# Patient Record
Sex: Male | Born: 1950
Health system: Southern US, Community
[De-identification: ages and names within clinical notes are randomized; demographics above are authoritative.]

## PROBLEM LIST (undated history)

## (undated) DIAGNOSIS — M109 Gout, unspecified: Secondary | ICD-10-CM

## (undated) DIAGNOSIS — K5790 Diverticulosis of intestine, part unspecified, without perforation or abscess without bleeding: Secondary | ICD-10-CM

## (undated) DIAGNOSIS — E785 Hyperlipidemia, unspecified: Secondary | ICD-10-CM

## (undated) DIAGNOSIS — I10 Essential (primary) hypertension: Secondary | ICD-10-CM

## (undated) DIAGNOSIS — K760 Fatty (change of) liver, not elsewhere classified: Secondary | ICD-10-CM

## (undated) DIAGNOSIS — G959 Disease of spinal cord, unspecified: Principal | ICD-10-CM

## (undated) DIAGNOSIS — K429 Umbilical hernia without obstruction or gangrene: Secondary | ICD-10-CM

## (undated) DIAGNOSIS — M5412 Radiculopathy, cervical region: Secondary | ICD-10-CM

## (undated) DIAGNOSIS — Z7982 Long term (current) use of aspirin: Secondary | ICD-10-CM

## (undated) DIAGNOSIS — G4700 Insomnia, unspecified: Secondary | ICD-10-CM

## (undated) DIAGNOSIS — M5135 Other intervertebral disc degeneration, thoracolumbar region: Secondary | ICD-10-CM

## (undated) DIAGNOSIS — N183 Chronic kidney disease, stage 3 unspecified: Secondary | ICD-10-CM

## (undated) DIAGNOSIS — K5710 Diverticulosis of small intestine without perforation or abscess without bleeding: Secondary | ICD-10-CM

## (undated) DIAGNOSIS — M4802 Spinal stenosis, cervical region: Secondary | ICD-10-CM

## (undated) DIAGNOSIS — F419 Anxiety disorder, unspecified: Secondary | ICD-10-CM

## (undated) DIAGNOSIS — I2510 Atherosclerotic heart disease of native coronary artery without angina pectoris: Secondary | ICD-10-CM

## (undated) DIAGNOSIS — F32A Depression, unspecified: Secondary | ICD-10-CM

## (undated) DIAGNOSIS — F329 Major depressive disorder, single episode, unspecified: Secondary | ICD-10-CM

## (undated) DIAGNOSIS — K219 Gastro-esophageal reflux disease without esophagitis: Secondary | ICD-10-CM

## (undated) DIAGNOSIS — E039 Hypothyroidism, unspecified: Secondary | ICD-10-CM

## (undated) DIAGNOSIS — F1411 Cocaine abuse, in remission: Secondary | ICD-10-CM

## (undated) DIAGNOSIS — K449 Diaphragmatic hernia without obstruction or gangrene: Secondary | ICD-10-CM

## (undated) HISTORY — PX: CARDIAC CATHETERIZATION: SHX172

## (undated) HISTORY — PX: CORONARY ARTERY BYPASS GRAFT: SHX141

## (undated) HISTORY — PX: CARDIAC SURGERY: SHX584

---

## 1998-05-12 DIAGNOSIS — Z951 Presence of aortocoronary bypass graft: Secondary | ICD-10-CM

## 1998-05-12 HISTORY — DX: Presence of aortocoronary bypass graft: Z95.1

## 1999-03-15 ENCOUNTER — Encounter: Payer: Self-pay | Admitting: Emergency Medicine

## 1999-03-15 ENCOUNTER — Inpatient Hospital Stay (HOSPITAL_COMMUNITY): Admission: EM | Admit: 1999-03-15 | Discharge: 1999-03-24 | Payer: Self-pay | Admitting: Emergency Medicine

## 1999-03-15 DIAGNOSIS — I2510 Atherosclerotic heart disease of native coronary artery without angina pectoris: Secondary | ICD-10-CM

## 1999-03-15 HISTORY — PX: LEFT HEART CATH AND CORONARY ANGIOGRAPHY: CATH118249

## 1999-03-15 HISTORY — DX: Atherosclerotic heart disease of native coronary artery without angina pectoris: I25.10

## 1999-03-16 ENCOUNTER — Encounter: Payer: Self-pay | Admitting: Cardiology

## 1999-03-18 ENCOUNTER — Encounter: Payer: Self-pay | Admitting: Thoracic Surgery (Cardiothoracic Vascular Surgery)

## 1999-03-18 HISTORY — PX: CORONARY ARTERY BYPASS GRAFT: SHX141

## 1999-03-19 ENCOUNTER — Encounter: Payer: Self-pay | Admitting: Thoracic Surgery (Cardiothoracic Vascular Surgery)

## 1999-03-21 ENCOUNTER — Encounter: Payer: Self-pay | Admitting: Thoracic Surgery (Cardiothoracic Vascular Surgery)

## 2003-03-28 ENCOUNTER — Other Ambulatory Visit: Payer: Self-pay

## 2003-03-29 HISTORY — PX: LEFT HEART CATH AND CORS/GRAFTS ANGIOGRAPHY: CATH118250

## 2006-03-10 ENCOUNTER — Other Ambulatory Visit: Payer: Self-pay

## 2006-03-11 ENCOUNTER — Inpatient Hospital Stay: Payer: Self-pay | Admitting: Internal Medicine

## 2006-03-12 ENCOUNTER — Other Ambulatory Visit: Payer: Self-pay

## 2006-03-12 HISTORY — PX: LEFT HEART CATH AND CORS/GRAFTS ANGIOGRAPHY: CATH118250

## 2006-08-10 ENCOUNTER — Ambulatory Visit: Payer: Self-pay | Admitting: Gastroenterology

## 2007-01-11 ENCOUNTER — Other Ambulatory Visit: Payer: Self-pay

## 2007-01-11 ENCOUNTER — Inpatient Hospital Stay: Payer: Self-pay | Admitting: Internal Medicine

## 2007-01-12 HISTORY — PX: LEFT HEART CATH AND CORS/GRAFTS ANGIOGRAPHY: CATH118250

## 2007-09-20 ENCOUNTER — Inpatient Hospital Stay: Payer: Self-pay | Admitting: Internal Medicine

## 2007-09-20 ENCOUNTER — Other Ambulatory Visit: Payer: Self-pay

## 2007-09-21 ENCOUNTER — Other Ambulatory Visit: Payer: Self-pay

## 2008-11-06 ENCOUNTER — Emergency Department: Payer: Self-pay | Admitting: Emergency Medicine

## 2008-11-08 ENCOUNTER — Inpatient Hospital Stay: Payer: Self-pay | Admitting: Internal Medicine

## 2008-12-20 ENCOUNTER — Emergency Department: Payer: Self-pay | Admitting: Emergency Medicine

## 2010-05-02 ENCOUNTER — Ambulatory Visit: Payer: Self-pay | Admitting: Cardiology

## 2010-05-02 HISTORY — PX: LEFT HEART CATH AND CORS/GRAFTS ANGIOGRAPHY: CATH118250

## 2011-08-05 ENCOUNTER — Observation Stay: Payer: Self-pay | Admitting: Internal Medicine

## 2011-08-05 LAB — COMPREHENSIVE METABOLIC PANEL
Alkaline Phosphatase: 100 U/L (ref 50–136)
Anion Gap: 12 (ref 7–16)
BUN: 11 mg/dL (ref 7–18)
Bilirubin,Total: 0.3 mg/dL (ref 0.2–1.0)
Co2: 23 mmol/L (ref 21–32)
Creatinine: 1.54 mg/dL — ABNORMAL HIGH (ref 0.60–1.30)
EGFR (Non-African Amer.): 49 — ABNORMAL LOW
Glucose: 100 mg/dL — ABNORMAL HIGH (ref 65–99)
Potassium: 4.2 mmol/L (ref 3.5–5.1)
SGOT(AST): 26 U/L (ref 15–37)
Sodium: 145 mmol/L (ref 136–145)
Total Protein: 7.2 g/dL (ref 6.4–8.2)

## 2011-08-05 LAB — CBC
MCV: 92 fL (ref 80–100)
Platelet: 202 10*3/uL (ref 150–440)
RBC: 4.72 10*6/uL (ref 4.40–5.90)
RDW: 14.4 % (ref 11.5–14.5)
WBC: 6.9 10*3/uL (ref 3.8–10.6)

## 2011-08-05 LAB — CK TOTAL AND CKMB (NOT AT ARMC)
CK, Total: 69 U/L (ref 35–232)
CK-MB: <0.5 ng/mL — ABNORMAL LOW (ref 0.5–3.6)

## 2011-08-06 LAB — CBC WITH DIFFERENTIAL/PLATELET
Basophil #: 0 10*3/uL (ref 0.0–0.1)
Basophil %: 0.5 %
Eosinophil %: 6 %
HCT: 40.4 % (ref 40.0–52.0)
Lymphocyte #: 1.7 10*3/uL (ref 1.0–3.6)
Lymphocyte %: 26.3 %
MCH: 31.6 pg (ref 26.0–34.0)
Monocyte %: 10.5 %
Platelet: 204 10*3/uL (ref 150–440)
RBC: 4.41 10*6/uL (ref 4.40–5.90)
RDW: 14.3 % (ref 11.5–14.5)

## 2011-08-06 LAB — BASIC METABOLIC PANEL
Anion Gap: 10 (ref 7–16)
BUN: 11 mg/dL (ref 7–18)
Calcium, Total: 8.4 mg/dL — ABNORMAL LOW (ref 8.5–10.1)
Co2: 28 mmol/L (ref 21–32)
Creatinine: 1.56 mg/dL — ABNORMAL HIGH (ref 0.60–1.30)
EGFR (African American): 59 — ABNORMAL LOW
EGFR (Non-African Amer.): 49 — ABNORMAL LOW
Osmolality: 291 (ref 275–301)
Sodium: 146 mmol/L — ABNORMAL HIGH (ref 136–145)

## 2011-08-06 LAB — TROPONIN I: Troponin-I: <0.02 ng/mL

## 2011-08-06 LAB — LIPID PANEL
Cholesterol: 141 mg/dL (ref 0–200)
Triglycerides: 375 mg/dL — ABNORMAL HIGH (ref 0–200)
VLDL Cholesterol, Calc: 75 mg/dL — ABNORMAL HIGH (ref 5–40)

## 2011-08-06 LAB — TSH: Thyroid Stimulating Horm: 4.99 u[IU]/mL — ABNORMAL HIGH

## 2011-11-19 ENCOUNTER — Encounter (HOSPITAL_COMMUNITY): Payer: Self-pay

## 2011-11-19 ENCOUNTER — Emergency Department (HOSPITAL_COMMUNITY)
Admission: EM | Admit: 2011-11-19 | Discharge: 2011-11-20 | Disposition: A | Discharge status: Home / Self Care | Payer: BC Managed Care – PPO | Attending: Emergency Medicine | Admitting: Emergency Medicine

## 2011-11-19 DIAGNOSIS — Z79899 Other long term (current) drug therapy: Secondary | ICD-10-CM | POA: Insufficient documentation

## 2011-11-19 DIAGNOSIS — I252 Old myocardial infarction: Secondary | ICD-10-CM | POA: Insufficient documentation

## 2011-11-19 DIAGNOSIS — I10 Essential (primary) hypertension: Secondary | ICD-10-CM | POA: Insufficient documentation

## 2011-11-19 DIAGNOSIS — R45851 Suicidal ideations: Secondary | ICD-10-CM | POA: Insufficient documentation

## 2011-11-19 DIAGNOSIS — F3289 Other specified depressive episodes: Secondary | ICD-10-CM | POA: Insufficient documentation

## 2011-11-19 DIAGNOSIS — F329 Major depressive disorder, single episode, unspecified: Secondary | ICD-10-CM | POA: Insufficient documentation

## 2011-11-19 HISTORY — DX: Essential (primary) hypertension: I10

## 2011-11-19 LAB — RAPID URINE DRUG SCREEN, HOSP PERFORMED
Amphetamines: NOT DETECTED
Barbiturates: NOT DETECTED
Benzodiazepines: NOT DETECTED
Tetrahydrocannabinol: NOT DETECTED

## 2011-11-19 LAB — COMPREHENSIVE METABOLIC PANEL
ALT: 15 U/L (ref 0–53)
AST: 15 U/L (ref 0–37)
Albumin: 4.1 g/dL (ref 3.5–5.2)
Alkaline Phosphatase: 113 U/L (ref 39–117)
Chloride: 103 mEq/L (ref 96–112)
Potassium: 4.4 mEq/L (ref 3.5–5.1)
Total Bilirubin: 0.3 mg/dL (ref 0.3–1.2)

## 2011-11-19 LAB — CBC
HCT: 43.8 % (ref 39.0–52.0)
Platelets: 274 10*3/uL (ref 150–400)
RBC: 4.98 MIL/uL (ref 4.22–5.81)
RDW: 12.8 % (ref 11.5–15.5)
WBC: 7.6 10*3/uL (ref 4.0–10.5)

## 2011-11-19 MED ORDER — LORAZEPAM 1 MG PO TABS
2.0000 mg | ORAL_TABLET | Freq: Once | ORAL | Status: AC
  Administered 2011-11-19: 2 mg via ORAL
  Filled 2011-11-19: qty 2

## 2011-11-19 NOTE — ED Provider Notes (Signed)
History     CSN: [id number]  Arrival date & time 11/19/11  1625   First MD Initiated Contact with Patient 11/19/11 2022      Chief Complaint  Patient presents with  . Depression    (Consider location/radiation/quality/duration/timing/severity/associated sxs/prior treatment) HPI Comments: For the past several months, patient has felt depressed.  There is nothing specific that has caused this, and actually says that everything in his life is going quite well.    Patient is a 61 y.o. male presenting with mental health disorder. The history is provided by the patient.  Mental Health Problem Episode onset: several months ago. This is a new problem.  Precipitated by: nothing. The degree of incapacity that he is experiencing as a consequence of his illness is moderate. He admits to suicidal ideas. He does not have a plan to commit suicide. He has not already injured self. He does not contemplate injuring another person. He has not already  injured another person.    Past Medical History  Diagnosis Date  . Hypertension   . MI (myocardial infarction)     Past Surgical History  Procedure Date  . Cardiac surgery     History reviewed. No pertinent family history.  History  Substance Use Topics  . Smoking status: Not on file  . Smokeless tobacco: Not on file  . Alcohol Use:       Review of Systems  All other systems reviewed and are negative.    Allergies  Review of patient's allergies indicates no known allergies.  Home Medications   Current Outpatient Rx  Name Route Sig Dispense Refill  . ACYCLOVIR 800 MG PO TABS Oral Take 800 mg by mouth 2 (two) times daily as needed. For rash    . ALPRAZOLAM 0.25 MG PO TABS Oral Take 0.25 mg by mouth 3 (three) times daily as needed. For anxiety    . ASPIRIN EC 81 MG PO TBEC Oral Take 81 mg by mouth daily.    . DULOXETINE HCL 60 MG PO CPEP Oral Take 60 mg by mouth daily.    Marland Kitchen LISINOPRIL 10 MG PO TABS Oral Take 10 mg by mouth daily.     Marland Kitchen OMEPRAZOLE 20 MG PO CPDR Oral Take 20 mg by mouth daily.    Marland Kitchen ZOLPIDEM TARTRATE 10 MG PO TABS Oral Take 10 mg by mouth every morning. For insomnia      BP 126/84  Pulse 83  Temp 98.2 F (36.8 C) (Oral)  Resp 16  SpO2 98%  Physical Exam  Nursing note and vitals reviewed. Constitutional: He is oriented to person, place, and time. He appears well-developed and well-nourished. No distress.  HENT:  Head: Normocephalic and atraumatic.  Neck: Normal range of motion. Neck supple.  Cardiovascular: Normal rate and regular rhythm.   No murmur heard. Pulmonary/Chest: Effort normal and breath sounds normal. No respiratory distress. He has no wheezes.  Abdominal: Soft. Bowel sounds are normal. He exhibits no distension. There is no tenderness.  Musculoskeletal: Normal range of motion. He exhibits no edema.  Neurological: He is alert and oriented to person, place, and time.  Skin: Skin is warm and dry. He is not diaphoretic.    ED Course  Procedures (including critical care time)  Labs Reviewed  COMPREHENSIVE METABOLIC PANEL - Abnormal; Notable for the following:    Creatinine, Ser 1.74 (*)     GFR calc non Af Amer 41 (*)     GFR calc Af Amer 48 (*)  All other components within normal limits  URINE RAPID DRUG SCREEN (HOSP PERFORMED) - Abnormal; Notable for the following:    Cocaine POSITIVE (*)     All other components within normal limits  CBC  ETHANOL  ACETAMINOPHEN LEVEL   No results found.   No diagnosis found.    MDM  I will consult the act team for eval.          Geoffery Lyons, MD 11/19/11 2329

## 2011-11-19 NOTE — ED Notes (Signed)
Pt and wife states pt has been depressed for over one week not caring for self and has not worked. Pt is tearful states "nothing is wrong in my life, why do I feel like this?" Pt denies any suicidal ideations or plan, but states "I just want to go to sleep and not wake up." Pt admits to using cocaine 1 to 2 time a week. Denies any other substance abuse. Pt treated with cymbalta and states he started feeling better, but it is no longer working.

## 2011-11-19 NOTE — ED Notes (Signed)
Act team at bedside 

## 2011-11-19 NOTE — ED Notes (Signed)
Pt c/o increase in anxiety Dr Judd Lien aware of pt.

## 2011-11-19 NOTE — BH Assessment (Signed)
Assessment Note   Billy Cisneros is an 61 y.o. malewho presents to [facility] seeking help for his depression and SI.  He reports that he has been depressed much of his life, but never really talked about it because people just dismiss it.  He states that other than worrying about his daughter who lives out of state, he cannot think of any stressors.  He has a happy life, is recently remarried, no financial worries, and feels life is going well, but he continues to feel hopeless and has no desire to live.  His wife reports he has been in bed since Saturday.  He states that his suicidal ideation has gotten worse and is starting to think of plans and is worried about whether he can be safe.  He doesn't want to upset his family, but today thought of going to his barn to hang himself and during our conversation mentioned taking pills.  He reports a significant decrease in his concentration and memory and weight loss over the last 2 weeks.  He reports uncontrollable crying, no desire to be around others, feeling hopeless and worthless.  Billy Cisneros meets criteria for inpatient admission for crisis stabilization and his information is being submitted to [facility] for review.  Axis I: Major Depression, Recurrent severe Axis II: Deferred Axis III:  Past Medical History  Diagnosis Date  . Hypertension   . MI (myocardial infarction)    Axis IV: problems with access to health care services Axis V: 21-30 behavior considerably influenced by delusions or hallucinations OR serious impairment in judgment, communication OR inability to function in almost all areas  Past Medical History:  Past Medical History  Diagnosis Date  . Hypertension   . MI (myocardial infarction)     Past Surgical History  Procedure Date  . Cardiac surgery     Family History: History reviewed. No pertinent family history.  Social History:  does not have a smoking history on file. He does not have any smokeless tobacco history on file.  He reports that he uses illicit drugs (Cocaine) about twice per week. His alcohol history not on file.  Additional Social History:  Alcohol / Drug Use History of alcohol / drug use?: Yes Substance #1 Name of Substance 1: Cocaine 1 - Age of First Use: 50s 1 - Amount (size/oz): 1 gram 1 - Frequency: 2 times per month 1 - Duration: 7-8 years 1 - Last Use / Amount: 1 week ago  CIWA: CIWA-Ar BP: 126/84 mmHg Pulse Rate: 83  COWS:    Allergies: No Known Allergies  Home Medications:  (Not in a hospital admission)  OB/GYN Status:  No LMP for male patient.  General Assessment Data Location of Assessment: [facility] ED Living Arrangements: Spouse/significant other Can pt return to current living arrangement?: Yes Admission Status: Voluntary Is patient capable of signing voluntary admission?: Yes Transfer from: Acute Hospital Referral Source: Self/Family/Friend  Education Status Is patient currently in school?: No Highest grade of school patient has completed: 2 years technical college  Risk to self Suicidal Ideation: Yes-Currently Present Suicidal Intent: No-Not Currently/Within Last 6 Months Is patient at risk for suicide?: Yes Suicidal Plan?: Yes-Currently Present Specify Current Suicidal Plan: hang self in barn Access to Means: Yes Specify Access to Suicidal Means: rope, pills What has been your use of drugs/alcohol within the last 12 months?: using cocaine a couple times a month Previous Attempts/Gestures: No Intentional Self Injurious Behavior: None Family Suicide History: No Recent stressful life event(s): Other (Comment) (worrying  about family) Persecutory voices/beliefs?: No Depression: Yes Depression Symptoms: Despondent;Tearfulness;Isolating;Fatigue;Loss of interest in usual pleasures;Feeling worthless/self pity Substance abuse history and/or treatment for substance abuse?: Yes (ocassional cocaine use) Suicide prevention information given to non-admitted patients: Not  applicable  Risk to Others Homicidal Ideation: No Thoughts of Harm to Others: No Current Homicidal Intent: No Current Homicidal Plan: No Access to Homicidal Means: No History of harm to others?: No Assessment of Violence: None Noted Does patient have access to weapons?: No Criminal Charges Pending?: No Does patient have a court date: No  Psychosis Hallucinations: None noted Delusions: None noted  Mental Status Report Appear/Hygiene: Other (Comment) (unremarkable) Eye Contact: Fair Motor Activity: Freedom of movement Speech: Logical/coherent;Soft;Slow Level of Consciousness: Quiet/awake Mood: Depressed;Despair Affect: Depressed Anxiety Level: Minimal Thought Processes: Coherent;Relevant Judgement: Unimpaired Orientation: Person;Place;Time;Situation Obsessive Compulsive Thoughts/Behaviors: Minimal  Cognitive Functioning Concentration: Decreased Memory: Recent Impaired;Remote Impaired IQ: Average Insight: Fair Impulse Control: Fair Appetite: Poor Weight Loss: 20  (3 weeks) Sleep: Increased Vegetative Symptoms: Staying in bed;Decreased grooming  ADLScreening [facility]) Patient's cognitive ability adequate to safely complete daily activities?: Yes Patient able to express need for assistance with ADLs?: Yes Independently performs ADLs?: Yes  Abuse/Neglect [facility]) Physical Abuse: Denies Verbal Abuse: Denies Sexual Abuse: Denies        ADL Screening (condition at time of admission) Patient's cognitive ability adequate to safely complete daily activities?: Yes Patient able to express need for assistance with ADLs?: Yes Independently performs ADLs?: Yes Weakness of Legs: None Weakness of Arms/Hands: None       Abuse/Neglect Assessment (Assessment to be complete while patient is alone) Physical Abuse: Denies Verbal Abuse: Denies Sexual Abuse: Denies Exploitation of patient/patient's resources: Denies Self-Neglect: Denies     Dispensing optician (For Healthcare) Advance Directive: Patient has advance directive, copy not in chart Type of Advance Directive: Healthcare Power of [place];Living will Advance Directive not in Chart: Copy requested from family Nutrition Screen Diet: Regular Unintentional weight loss greater than 10lbs within the last month: No Problems chewing or swallowing foods and/or liquids: No Home Tube Feeding or Total Parenteral Nutrition (TPN): No Patient appears severely malnourished: No        Disposition:  Disposition Disposition of Patient: Inpatient treatment program Type of inpatient treatment program: Adult  On Site Evaluation by:  Deno Reviewed with Physician:  Dreama Saa Marlana Latus 11/19/2011 10:23 PM

## 2011-11-19 NOTE — ED Notes (Signed)
MD at bedside. 

## 2011-11-20 ENCOUNTER — Encounter (HOSPITAL_COMMUNITY): Payer: Self-pay | Admitting: *Deleted

## 2011-11-20 ENCOUNTER — Inpatient Hospital Stay (HOSPITAL_COMMUNITY)
Admission: AD | Admit: 2011-11-20 | Discharge: 2011-11-22 | DRG: 430 | Disposition: A | Discharge status: Home / Self Care | Payer: BC Managed Care – PPO | Source: Ambulatory Visit | Attending: Psychiatry | Admitting: Psychiatry

## 2011-11-20 DIAGNOSIS — F332 Major depressive disorder, recurrent severe without psychotic features: Principal | ICD-10-CM | POA: Diagnosis present

## 2011-11-20 DIAGNOSIS — I252 Old myocardial infarction: Secondary | ICD-10-CM

## 2011-11-20 DIAGNOSIS — F1420 Cocaine dependence, uncomplicated: Secondary | ICD-10-CM | POA: Diagnosis present

## 2011-11-20 DIAGNOSIS — F329 Major depressive disorder, single episode, unspecified: Secondary | ICD-10-CM

## 2011-11-20 DIAGNOSIS — F17200 Nicotine dependence, unspecified, uncomplicated: Secondary | ICD-10-CM | POA: Diagnosis present

## 2011-11-20 DIAGNOSIS — Z79899 Other long term (current) drug therapy: Secondary | ICD-10-CM

## 2011-11-20 DIAGNOSIS — F1994 Other psychoactive substance use, unspecified with psychoactive substance-induced mood disorder: Secondary | ICD-10-CM | POA: Diagnosis present

## 2011-11-20 DIAGNOSIS — I10 Essential (primary) hypertension: Secondary | ICD-10-CM | POA: Diagnosis present

## 2011-11-20 DIAGNOSIS — K219 Gastro-esophageal reflux disease without esophagitis: Secondary | ICD-10-CM | POA: Diagnosis present

## 2011-11-20 DIAGNOSIS — M109 Gout, unspecified: Secondary | ICD-10-CM | POA: Diagnosis present

## 2011-11-20 HISTORY — DX: Anxiety disorder, unspecified: F41.9

## 2011-11-20 HISTORY — DX: Gastro-esophageal reflux disease without esophagitis: K21.9

## 2011-11-20 HISTORY — DX: Gout, unspecified: M10.9

## 2011-11-20 MED ORDER — ACETAMINOPHEN 325 MG PO TABS: 650.0000 mg | ORAL_TABLET | Freq: Four times a day (QID) | ORAL | Status: DC | PRN

## 2011-11-20 MED ORDER — PANTOPRAZOLE SODIUM 40 MG PO TBEC
40.0000 mg | DELAYED_RELEASE_TABLET | Freq: Every day | ORAL | Status: DC
  Administered 2011-11-20 – 2011-11-22 (×3): 40 mg via ORAL
  Filled 2011-11-20 (×5): qty 1

## 2011-11-20 MED ORDER — MIRTAZAPINE 15 MG PO TABS
15.0000 mg | ORAL_TABLET | Freq: Every evening | ORAL | Status: DC | PRN
  Administered 2011-11-20 – 2011-11-21 (×2): 15 mg via ORAL
  Filled 2011-11-20 (×2): qty 1
  Filled 2011-11-20: qty 7

## 2011-11-20 MED ORDER — ASPIRIN 81 MG PO CHEW
81.0000 mg | CHEWABLE_TABLET | Freq: Every day | ORAL | Status: DC
  Administered 2011-11-20 – 2011-11-22 (×3): 81 mg via ORAL
  Filled 2011-11-20 (×7): qty 1

## 2011-11-20 MED ORDER — MAGNESIUM HYDROXIDE 400 MG/5ML PO SUSP
30.0000 mL | Freq: Every day | ORAL | Status: DC | PRN
Start: 2011-11-20 — End: 2011-11-22

## 2011-11-20 MED ORDER — BUPROPION HCL ER (XL) 150 MG PO TB24
150.0000 mg | ORAL_TABLET | Freq: Every day | ORAL | Status: DC
  Administered 2011-11-20 – 2011-11-22 (×3): 150 mg via ORAL
  Filled 2011-11-20: qty 7
  Filled 2011-11-20 (×6): qty 1

## 2011-11-20 MED ORDER — NICOTINE 21 MG/24HR TD PT24: 21.0000 mg | MEDICATED_PATCH | Freq: Every day | TRANSDERMAL | Status: DC

## 2011-11-20 MED ORDER — LISINOPRIL 10 MG PO TABS
10.0000 mg | ORAL_TABLET | Freq: Every day | ORAL | Status: DC
  Administered 2011-11-20 – 2011-11-22 (×3): 10 mg via ORAL
  Filled 2011-11-20 (×7): qty 1

## 2011-11-20 MED ORDER — TRAZODONE HCL 50 MG PO TABS
50.0000 mg | ORAL_TABLET | Freq: Every evening | ORAL | Status: DC | PRN
  Filled 2011-11-20: qty 7

## 2011-11-20 MED ORDER — ALUM & MAG HYDROXIDE-SIMETH 200-200-20 MG/5ML PO SUSP
30.0000 mL | ORAL | Status: DC | PRN
  Administered 2011-11-21: 30 mL via ORAL

## 2011-11-20 NOTE — Progress Notes (Signed)
D: Patient's wife with patient during this assessment. Pt appeared pleasant and bright on approach, he reported a good day, talked about his previous medication that wasn't working and the side effect he had while on that medication. He reported looking forward to the new medication, " Wellbutrin". He said he lost a lot of weight while on Cymbalta and it gave him tremors. A: Writer explained to patient that Cymbalta suppresses appetite, which might be a reason for his weight loss. Encouraged patient to continue taking the new medication as prescribed when he gets home, because most anti depressant takes up to 2 weeks before patient can get the full benefit. R: PT denied SI/HI and denied hallucinations. Receptive to support and encouragement.

## 2011-11-20 NOTE — H&P (Signed)
Medical/psychiatric screening examination/treatment/procedure(s) were performed by non-physician practitioner and as supervising physician I was immediately available for consultation/collaboration.  I have seen and examined this patient and agree the major elements of this evaluation.  

## 2011-11-20 NOTE — Tx Team (Signed)
Initial Interdisciplinary Treatment Plan  PATIENT STRENGTHS: (choose at least two) Ability for insight Average or above average intelligence Capable of independent living Communication skills General fund of knowledge Motivation for treatment/growth Supportive family/friends  PATIENT STRESSORS: Health problems Substance abuse   PROBLEM LIST: Problem List/Patient Goals Date to be addressed Date deferred Reason deferred Estimated date of resolution  Depression      Cocaine use      Risk for self harm      HTN/cardiac problems      Gout                               DISCHARGE CRITERIA:  Ability to meet basic life and health needs Improved stabilization in mood, thinking, and/or behavior Motivation to continue treatment in a less acute level of care Reduction of life-threatening or endangering symptoms to within safe limits Verbal commitment to aftercare and medication compliance  PRELIMINARY DISCHARGE PLAN: Attend aftercare/continuing care group Outpatient therapy Participate in family therapy Return to previous living arrangement  PATIENT/FAMIILY INVOLVEMENT: This treatment plan has been presented to and reviewed with the patient, Billy Cisneros, and/or family member.  The patient and family have been given the opportunity to ask questions and make suggestions.  Jesus Genera [facility] 11/20/2011, 4:07 AM

## 2011-11-20 NOTE — Progress Notes (Signed)
D: Pt denies SI/HI/AV. Pt is pleasant and cooperative. Pt does not understand why he is depressed because everything in his life is going right at this time. Pt rates depression at a 8, anxiety at a 5, and Helplessness/hopelessness at a 6. Pt states he is willing to try anything.  A: Pt was offered support and encouragement. Pt was given scheduled medications. Pt was encourage to attend groups. Q 15 minute checks were done for safety.  R:Pt attends groups and interacts well with peers and staff. Pt has very good insight and is vested. Pt is taking medication. Pt has no complaints.Pt receptive to treatment and safety maintained on unit.

## 2011-11-20 NOTE — ED Notes (Signed)
REPORTS GIVEN TO [facility] NURSE - JANE , PT. SIGNED ELECTRONIC CONSENT TO TRANSFER , SECURITY TRANSPORTED PT. WITH DOCUMENTS.

## 2011-11-20 NOTE — BH Assessment (Signed)
[facility] Assessment Progress Note      Pt was accepted by Jorje Guild to [facility] : [facility] room 504-1.  Communicated disposition to ED staff and had patient sign support paperwork.  Attempted to contact [employer] for precert using number indicated on the back of patient's insurance card, [phone].  Message indicated a website local customers could use to check claims and offered business hours and said to press one for urgent requests requiring service within the next 24 hours (all other requests please call back within business hours).  First two attempts yielded a dead signal, third and fourth attempt connected to a personal voicemail stating, "Hi this is Zella Ball, please leave a message."  Left a voicemail with my name and number but no patient identifiers.

## 2011-11-20 NOTE — Progress Notes (Signed)
Patient seen during d/c planning group and treatment team.  He acknowledges admitting to the hospital due to being depressed.  He states he has experienced depression for the past ten years but has not been on medications consistently.  He currently denies SI/HI.  Patient shared that he has a wonderful wife and everything for.  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

## 2011-11-20 NOTE — Tx Team (Signed)
Interdisciplinary Treatment Plan Update (Adult)  Date:  11/20/2011  Time Reviewed:  11:20 AM   Progress in Treatment: Attending groups:   Yes   Participating in groups:  Yes Taking medication as prescribed:  Yes Tolerating medication:  Yes Family/Significant othe contact made: Contact to be made with wife Patient understands diagnosis:  Yes Discussing patient identified problems/goals with staff: Yes Medical problems stabilized or resolved: Yes Denies suicidal/homicidal ideation:Yes Issues/concerns per patient self-inventory:  Other:  New problem(s) identified:  Reason for Continuation of Hospitalization: Anxiety Depression Medication stabilization  Interventions implemented related to continuation of hospitalization:  Medication stabilization, Coping Skills Development  Medication Management; safety checks q 15 mins  Additional comments:  Estimated length of stay: 3-5 days  Discharge Plan: Home with outpatient follow up  New goal(s):  Review of initial/current patient goals per problem list:    1.  Goal(s): Eliminate SI/other thoughts of self harm   Met:  Yes  Target date: d/c  As evidenced by:  Patient is not endorsing SI or other thoughts of self harm  2.  Goal (s):  Reduce depression/anxiety (rated at eight and five)   Met:  No  Target date: d/c  As evidenced by: Patient will rate symptoms at four or below    3.  Goal(s): .stabilize on meds   Met:  No  Target date: d/c  As evidenced by:  Patient will report being stable on medications - symptoms have decreased    4.  Goal(s): Refer for outpatient follow up  Met:  No  Target date: d/c  As evidenced by:  Follow up appointment will be scheduled  Attendees: Patient:  Billy Cisneros 11/20/2011 11:20 AM  Other:  Ellison Hughs, Pharm D 11/20/2011 11:21 AM  Physician:  Orson Aloe, MD 11/20/2011 11:20 AM   Nursing:   Barrie Folk, RN 11/20/2011 11:20 AM   CaseManager:  Juline Patch, LCSW  11/20/2011 11:20 AM   Other:  Berneice Heinrich, RN 11/20/2011 11:20 AM   Other:  Omelia Blackwater, RN      11/20/2011 11:21 AM

## 2011-11-20 NOTE — H&P (Signed)
Psychiatric Admission Assessment Adult  Patient Identification:  Billy Cisneros  Date of Evaluation:  11/20/2011  Chief Complaint:  MDD, Recurrent, Severe  History of Present Illness: This is a 61 year old Caucasian male, admitted to [name] from the [name] ED with complaints of increased depression and suicidal thoughts. Patient reports, "I came to [name] because I felt it is better than [name]. I have been feeling very depressed for sometime. It seems like I cannot concentrate on anything to save my life. Everything seem to be out of wack. I don't know why I am feeling this way because my home life is good, my job is going well and there are no financial and or health issues. I don't feel like doing anything but to lie down in bed and sleep all day. This has been going on for a while, but worsening for the past 2 months.  tried to let my friends and family know about how I am feeling, but everybody keep brushing it off like I am bluffing. The only thing that worries me most of the time is my daughter. She lives in [name] with her family. She is 60 years old, but she still is my child. She is doing well, but I cannot help but to worry about her. [place] is big and busy. It is true and obvious that people can actually get lost and no one will notice. I went to my primary care physician 2 months ago. I informed him that I am very depressed. He started me on Cymbalta. Cymbalta is not helping my depression, rather I have seen that it has so many side effects. It is making me feel more anxious and jerky. I started feeling suicidal and even have thoughts about hanging my self in the barn and or swallow bunch of pills".  ROS Per ED Provider:Constitutional: He is oriented to person, place, and time. He appears well-developed and well-nourished. No distress.  HENT:  Head: Normocephalic and atraumatic.  Neck: Normal range of motion. Neck supple.  Cardiovascular: Normal rate and  regular rhythm.  No murmur heard.  Pulmonary/Chest: Effort normal and breath sounds normal. No respiratory distress. He has no wheezes.  Abdominal: Soft. Bowel sounds are normal. He exhibits no distension. There is no tenderness.  Musculoskeletal: Normal range of motion. He exhibits no edema.  Neurological: He is alert and oriented to person, place, and time.  Skin: Skin is warm and dry. He is not diaphoretic.   Mood Symptoms:  Anhedonia, Sadness, SI,  Depression Symptoms:  depressed mood, suicidal thoughts without plan, anxiety, weight loss,  (Hypo) Manic Symptoms:  Irritable Mood,  Anxiety Symptoms:  Excessive Worry,  Psychotic Symptoms:  Hallucinations: None  PTSD Symptoms: Had a traumatic exposure:  None  Past Psychiatric History: Diagnosis: Major depressive disorder, recurrent episode  Hospitalizations: [name]  Outpatient Care: None reported  Substance Abuse Care: None reported  Self-Mutilation: None reported  Suicidal Attempts: Denies attempts, admits thoughts.  Violent Behaviors: None reported   Past Medical History:   Past Medical History  Diagnosis Date  . Hypertension   . MI (myocardial infarction)   . Anxiety   . GERD (gastroesophageal reflux disease)   . Gout      Allergies:  No Known Allergies  PTA Medications: Prescriptions prior to admission  Medication Sig Dispense Refill  . acyclovir (ZOVIRAX) 800 MG tablet Take 800 mg by mouth 2 (two) times daily as needed. For rash      .  ALPRAZolam (XANAX) 0.25 MG tablet Take 0.25 mg by mouth 3 (three) times daily as needed. For anxiety      . aspirin EC 81 MG tablet Take 81 mg by mouth daily.      . DULoxetine (CYMBALTA) 60 MG capsule Take 60 mg by mouth daily.      Marland Kitchen lisinopril (PRINIVIL,ZESTRIL) 10 MG tablet Take 10 mg by mouth daily.      Marland Kitchen omeprazole (PRILOSEC) 20 MG capsule Take 20 mg by mouth daily.      Marland Kitchen zolpidem (AMBIEN) 10 MG tablet Take 10 mg by mouth every morning. For insomnia       Substance  Abuse History in the last 12 months: Substance Age of 1st Use Last Use Amount Specific Type  Nicotine Patient denies any use tobacco product, alcohol and or drugs.     Alcohol      Cannabis      Opiates      Cocaine      Methamphetamines      LSD      Ecstasy      Benzodiazepines      Caffeine      Inhalants      Others:                         Consequences of Substance Abuse: Medical Consequences:  Liver damage Legal Consequences:  Arrests, jail time Family Consequences:  Fmily discord  Social History: Current Place of Residence: [facility] of Birth: [place]  Family Members: "My wife and step children"  Marital Status:  Married  Children:1  Sons:0  Daughters: 1  Relationships:"I'M married"  Education:  Acupuncturist Problems/Performance: None reported  Religious Beliefs/Practices: None reported  History of Abuse (Emotional/Phsycial/Sexual): Denies  Occupational Experiences: Employed  Hotel manager History:  None.  Legal History: None reported  Hobbies/Interests: None  Family History:  History reviewed. No pertinent family history.  Mental Status Examination/Evaluation: Objective:  Appearance: Casual  Eye Contact::  Good  Speech:  Clear and Coherent  Volume:  Normal  Mood:  Depressed  Affect:  Flat  Thought Process:  Coherent, Intact and Logical  Orientation:  Full  Thought Content:  Rumination  Suicidal Thoughts:  No  Homicidal Thoughts:  No  Memory:  Immediate;   Good Recent;   Good Remote;   Good  Judgement:  Fair  Insight:  Fair  Psychomotor Activity:  Normal  Concentration:  Good  Recall:  Good  Akathisia:  No  Handed:  Right  AIMS (if indicated):     Assets:  Desire for Improvement  Sleep:  Number of Hours: 1.5     Laboratory/X-Ray: None Psychological Evaluation(s)      Assessment:    AXIS I:  Major depressive disorder, recurrent episode AXIS II:  Deferred AXIS III:   Past Medical History  Diagnosis Date  .  Hypertension   . MI (myocardial infarction)   . Anxiety   . GERD (gastroesophageal reflux disease)   . Gout    AXIS IV:  other psychosocial or environmental problems AXIS V:  11-20 some danger of hurting self or others possible OR occasionally fails to maintain minimal personal hygiene OR gross impairment in communication  Treatment Plan/Recommendations: Admit for safety and stabilization. Review and reinstate any pertinent home medications for other medical issues. Obtain TSH, T2, T3  Treatment Plan Summary: Daily contact with patient to assess and evaluate symptoms and progress in treatment Medication management  Current Medications:  Current Facility-Administered Medications  Medication Dose Route Frequency Provider Last Rate Last Dose  . acetaminophen (TYLENOL) tablet 650 mg  650 mg Oral Q6H PRN Jorje Guild, PA-C      . alum & mag hydroxide-simeth (MAALOX/MYLANTA) 200-200-20 MG/5ML suspension 30 mL  30 mL Oral Q4H PRN Jorje Guild, PA-C      . buPROPion (WELLBUTRIN XL) 24 hr tablet 150 mg  150 mg Oral Daily Mike Craze, MD      . magnesium hydroxide (MILK OF MAGNESIA) suspension 30 mL  30 mL Oral Daily PRN Jorje Guild, PA-C      . traZODone (DESYREL) tablet 50 mg  50 mg Oral QHS PRN Jorje Guild, PA-C      . DISCONTD: nicotine (NICODERM CQ - dosed in mg/24 hours) patch 21 mg  21 mg Transdermal Q0600 Jorje Guild, PA-C       Facility-Administered Medications Ordered in Other Encounters  Medication Dose Route Frequency Provider Last Rate Last Dose  . LORazepam (ATIVAN) tablet 2 mg  2 mg Oral Once Geoffery Lyons, MD   2 mg at 11/19/11 2305    Observation Level/Precautions:  Q 15 minutes checks for safty  Laboratory:  Obtain TSH, T3, T4  Psychotherapy:  Group  Medications:  See medication lists  Routine PRN Medications:  Yes  Consultations: None indicated at this time   Discharge Concerns: Safety    Other:     Armandina Stammer I 7/11/201312:09 PM

## 2011-11-20 NOTE — Progress Notes (Signed)
[facility] MD Progress Note  11/20/2011 8:52 PM  Diagnosis:   Axis I: Major Depression, Recurrent severe Axis II: Deferred Axis III:  Past Medical History  Diagnosis Date  . Hypertension   . MI (myocardial infarction)   . Anxiety   . GERD (gastroesophageal reflux disease)   . Gout    Axis IV: other psychosocial or environmental problems Axis V: 41-50 serious symptoms  ADL's:  Intact  Sleep: Fair  Appetite:  Fair  Suicidal Ideation:  Pt denies any thoughts, plans, intent of suicide, but had been suicidal before admission. Homicidal Ideation:  Pt denies any thoughts, plans, intent of homicide  Mental Status Examination/Evaluation: Objective:  Appearance: Casual  Eye Contact::  Good  Speech:  Clear and Coherent  Volume:  Normal  Mood:  Anxious, Depressed, Hopeless, Irritable and Worthless  Affect:  Congruent  Thought Process:  Coherent  Orientation:  Full  Thought Content:  WDL  Suicidal Thoughts:  No  Homicidal Thoughts:  No  Memory:  Immediate;   Fair Recent;   Fair Remote;   Fair  Judgement:  Impaired  Insight:  Lacking  Psychomotor Activity:  Normal  Concentration:  Fair  Recall:  Fair  Akathisia:  No  Handed:  Left  AIMS (if indicated):     Assets:  Communication Skills Desire for Improvement  Sleep:  Number of Hours: 1.5    Vital Signs:Blood pressure 144/88, pulse 79, temperature 97.4 F (36.3 C), temperature source Oral, resp. rate 18, height 5' 5.5" (1.664 m), weight 68.947 kg (152 lb). Current Medications: Current Facility-Administered Medications  Medication Dose Route Frequency Provider Last Rate Last Dose  . acetaminophen (TYLENOL) tablet 650 mg  650 mg Oral Q6H PRN Jorje Guild, PA-C      . alum & mag hydroxide-simeth (MAALOX/MYLANTA) 200-200-20 MG/5ML suspension 30 mL  30 mL Oral Q4H PRN Jorje Guild, PA-C      . aspirin chewable tablet 81 mg  81 mg Oral Daily Mike Craze, MD   81 mg at 11/20/11 1824  . buPROPion (WELLBUTRIN XL) 24 hr tablet 150 mg   150 mg Oral Daily Mike Craze, MD   150 mg at 11/20/11 1254  . lisinopril (PRINIVIL,ZESTRIL) tablet 10 mg  10 mg Oral Daily Mike Craze, MD   10 mg at 11/20/11 1824  . magnesium hydroxide (MILK OF MAGNESIA) suspension 30 mL  30 mL Oral Daily PRN Jorje Guild, PA-C      . mirtazapine (REMERON) tablet 15 mg  15 mg Oral QHS PRN Verne Spurr, PA-C      . pantoprazole (PROTONIX) EC tablet 40 mg  40 mg Oral Q1200 Mike Craze, MD   40 mg at 11/20/11 1824  . traZODone (DESYREL) tablet 50 mg  50 mg Oral QHS PRN Jorje Guild, PA-C      . DISCONTD: nicotine (NICODERM CQ - dosed in mg/24 hours) patch 21 mg  21 mg Transdermal Q0600 Jorje Guild, PA-C       Facility-Administered Medications Ordered in Other Encounters  Medication Dose Route Frequency Provider Last Rate Last Dose  . LORazepam (ATIVAN) tablet 2 mg  2 mg Oral Once Geoffery Lyons, MD   2 mg at 11/19/11 2305    Lab Results:  Results for orders placed during the hospital encounter of 11/19/11 (from the past 48 hour(s))  URINE RAPID DRUG SCREEN (HOSP PERFORMED)     Status: Abnormal   Collection Time   11/19/11  5:57 PM  Component Value Range Comment   Opiates NONE DETECTED  NONE DETECTED    Cocaine POSITIVE (*) NONE DETECTED    Benzodiazepines NONE DETECTED  NONE DETECTED    Amphetamines NONE DETECTED  NONE DETECTED    Tetrahydrocannabinol NONE DETECTED  NONE DETECTED    Barbiturates NONE DETECTED  NONE DETECTED   CBC     Status: Normal   Collection Time   11/19/11  7:26 PM      Component Value Range Comment   WBC 7.6  4.0 - 10.5 K/uL    RBC 4.98  4.22 - 5.81 MIL/uL    Hemoglobin 15.4  13.0 - 17.0 g/dL    HCT 16.1  09.6 - 04.5 %    MCV 88.0  78.0 - 100.0 fL    MCH 30.9  26.0 - 34.0 pg    MCHC 35.2  30.0 - 36.0 g/dL    RDW 40.9  81.1 - 91.4 %    Platelets 274  150 - 400 K/uL   COMPREHENSIVE METABOLIC PANEL     Status: Abnormal   Collection Time   11/19/11  7:26 PM      Component Value Range Comment   Sodium 142  135 - 145 mEq/L      Potassium 4.4  3.5 - 5.1 mEq/L    Chloride 103  96 - 112 mEq/L    CO2 28  19 - 32 mEq/L    Glucose, Bld 87  70 - 99 mg/dL    BUN 13  6 - 23 mg/dL    Creatinine, Ser 7.82 (*) 0.50 - 1.35 mg/dL    Calcium 9.6  8.4 - 95.6 mg/dL    Total Protein 7.2  6.0 - 8.3 g/dL    Albumin 4.1  3.5 - 5.2 g/dL    AST 15  0 - 37 U/L    ALT 15  0 - 53 U/L    Alkaline Phosphatase 113  39 - 117 U/L    Total Bilirubin 0.3  0.3 - 1.2 mg/dL    GFR calc non Af Amer 41 (*) >90 mL/min    GFR calc Af Amer 48 (*) >90 mL/min   ETHANOL     Status: Normal   Collection Time   11/19/11  7:26 PM      Component Value Range Comment   Alcohol, Ethyl (B) <11  0 - 11 mg/dL   ACETAMINOPHEN LEVEL     Status: Normal   Collection Time   11/19/11  7:26 PM      Component Value Range Comment   Acetaminophen (Tylenol), Serum <15.0  10 - 30 ug/mL     Physical Findings: AIMS: Facial and Oral Movements Muscles of Facial Expression: None, normal Lips and Perioral Area: None, normal Jaw: None, normal Tongue: None, normal,Extremity Movements Upper (arms, wrists, hands, fingers): None, normal Lower (legs, knees, ankles, toes): None, normal, Trunk Movements Neck, shoulders, hips: None, normal, Overall Severity Severity of abnormal movements (highest score from questions above): None, normal Incapacitation due to abnormal movements: None, normal, Dental Status Current problems with teeth and/or dentures?: No Does patient usually wear dentures?: Yes (Pt wears a partial)  CIWA:    COWS:  COWS Total Score: 1   Treatment Plan Summary: Daily contact with patient to assess and evaluate symptoms and progress in treatment Medication management  Plan: Admit, continue home medications, use Remeron and Trazodone for sedation.  Refer for long term rehab.  Discussed the risks, benefits, and probable clinical course with  and without treatment.  Pt is agreeable to the current course of treatment.  Billy Cisneros 11/20/2011, 8:52 PM

## 2011-11-20 NOTE — Progress Notes (Signed)
Pt attend group and was active and appropriate. Pt played apples to apples and appeared to have enjoyed the therapeutic activity. 

## 2011-11-20 NOTE — Progress Notes (Signed)
New admit, male, 61 yo, to the 500 hall for increasing depression/SI with no plan.  Pt reported to the ED staff that he would just like to go to sleep and not wake up.  Pt denies SI at this time.  Pt reports he really doesn't know why he is feeling so depressed.  He feels it may be the medication that he was prescribed by his MD.  He states his home life is good.  He has been married 6 years, and his wife is very supportive.  He works third shift as a Naval architect and has no financial issues.  He does admit to using cocaine occasionally, maybe 2-3 times in a month, but does not drink alcohol.  He also has had a 15-20 lb wgt loss in the last month and a half.  He has had by-pass surgery and takes medication for HTN.  He has gout and GERD.  He was appropriate/cooperative during the admission process.  He reported to this Clinical research associate that he sometimes has memory issues, and says dementia runs in his family.  He was given a salad and briefly oriented to the unit and room.  When the MHT brought him to the unit, he made an inappropriate comment to her in jest.("If I get scared, will you come lay down with me?")  Pt was informed that was not appropriate and he immediately stated he was only joking.  Initiated safety checks q15 minutes.

## 2011-11-20 NOTE — Progress Notes (Signed)
Psychoeducational Group Note  Date:  11/20/2011 Time: 1100  Group Topic/Focus:  Overcoming Stress:   The focus of this group is to define stress and help patients assess their triggers.  Participation Level:  Active  Participation Quality:  Appropriate, Attentive, Sharing and Supportive  Affect:  Appropriate  Cognitive:  Appropriate and Oriented  Insight:  Good  Engagement in Group:  Good  Additional Comments:  Pt participated in a Psychoeducational group of overcoming stress. Pt asked what is stress and triggers of stress from Clinical research associate. Pt was paired with a peer and given a form of question to a stress interview. Pt stated reasons why stress is always a negative but it can sometimes be a positive. Pt asked what stresses him out the most pt stated the fear of unknown. Pt also receive a form on stress management ideas ways to cope with stress.   Karleen Hampshire Brittini 11/20/2011, 6:22 PM

## 2011-11-21 DIAGNOSIS — F1420 Cocaine dependence, uncomplicated: Secondary | ICD-10-CM | POA: Diagnosis present

## 2011-11-21 DIAGNOSIS — F17200 Nicotine dependence, unspecified, uncomplicated: Secondary | ICD-10-CM | POA: Diagnosis present

## 2011-11-21 DIAGNOSIS — F329 Major depressive disorder, single episode, unspecified: Secondary | ICD-10-CM | POA: Diagnosis present

## 2011-11-21 LAB — VITAMIN D 25 HYDROXY (VIT D DEFICIENCY, FRACTURES): Vit D, 25-Hydroxy: 37 ng/mL (ref 30–89)

## 2011-11-21 LAB — T3, FREE: T3, Free: 2.9 pg/mL (ref 2.3–4.2)

## 2011-11-21 NOTE — Progress Notes (Signed)
  D) Patient pleasant and cooperative upon my assessment. Patient states slept "great, once I got the medicine."  Patient describes his appetite as "good." Patient rates depression as  10 /10, patient rates hopeless feelings as  1/10. Patient denies SI/HI, denies A/V hallucinations.   A) Patient offered support and encouragement, patient encouraged to discuss feelings/concerns with staff. Patient verbalized understanding. Patient monitored Q15 minutes for safety. Patient met with MD and treatment team to discuss today's goals and plan of care.  R) Patient active on unit, attending groups in day room and meals in dining room.  Patient has a plan to "follow up with behavioral center" once he is discharged from [facility]. Patient taking medications as ordered. Will continue to monitor.

## 2011-11-21 NOTE — Progress Notes (Signed)
Brief Nutrition Note  Patient identified on the Nutrition Risk Report for unintended weight loss greater than 10 pounds in the past month.   Body mass index is 24.91 kg/(m^2). Pt meets criteria for healthy, normal weight based on current BMI.   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 Noted pt with facial swelling on the right hand side of his face which pt states has been occuring on/off for the past 2-3 years and that when he goes to see his doctor, his face is never swollen. Pt states the swelling causes him to have a bad taste in his mouth, like there is an abscess, however pt states his dentist has worked him up for abscess and done scans but nothing was ever found. Pt states his appetite has returned since admission, and now that he is no longer on Cymbalta, and he is eating great. Pt states his wife cooks heart healthy meals at home. Pt not interested in nutritional supplements as he is currently happy with weight and not interested in gaining any weight. Pt reports eating great. Pt without any further nutritional needs or concerns.   Pt meets criteria for severe malnutrition of social/environmental circumstances AEB 8.9% weight loss with <50% estimated energy intake in the past 2 months per pt report.   Intervention: Encouraged continued excellent meal intake.   Dietitian# [phone]

## 2011-11-21 NOTE — Progress Notes (Signed)
[facility] Group Notes: (Counselor/Nursing/MHT/Case Management/Adjunct) 11/21/2011   @1 :15 -2:30pm Preventing Relapse  Type of Therapy:  Group Therapy  Participation Level:  Minimal  Participation Quality: Attentive  Affect:  Blunted  Cognitive:  Appropriate  Insight:  Limited  Engagement in Group: Minimal  Engagement in Therapy: Minimal   Modes of Intervention:  Support and Exploration  Summary of Progress/Problems: Billy Cisneros was quiet at the beginning of group, but near the end he seemed to relate well to other group members, and helped them to describe how they are impacted by triggers. He talked briefly about being frustrated and embarrassed when lied to, and suggested ways to deal with overwhelming feelings.   Angus Palms, LCSW 11/21/2011 4:09 PM

## 2011-11-21 NOTE — Progress Notes (Signed)
[facility] Group Notes:  (Counselor/Nursing/MHT/Case Management/Adjunct)  11/21/2011 6:50 PM  Type of Therapy:  Psychoeducational Skills  Participation Level:  Active  Participation Quality:  Appropriate  Affect:  Appropriate  Cognitive:  Appropriate  Insight:  Good  Engagement in Group:  Good  Engagement in Therapy:  Good  Modes of Intervention:  Activity and Socialization  Summary of Progress/Problems:Staff presented the group with a therapeutic activity entitled "Human Bingo". Staff verbalized group rules and directives to the patient. The purpose of this group increase socialization skills amongst the patient and their peers. The goal is for each patient to fill in their entire sheet. The patients are encouraged to mingle with each other to find someone to fulfill each box. At the end of the group the staff asked each patient to identify one peer that they had something in common with or if there was anything that surprised them about one of their peers. This patient was very engaged in the activity and was able to socialize with his peers in an appropriate manner.  Staff encouraged the patient to utilize coping skills and to engage in more socialization activities in his community. The patient stated that he enjoyed the group and it was a great tool to help him with developing healthier relationships with his family.     Ardelle Park O 11/21/2011, 6:50 PM

## 2011-11-21 NOTE — Progress Notes (Signed)
D: Pt appeared to be improving. His wife visited again today. Pt stated that his wife is very supportive and she is part of the reason he is improving. His mood and affect appropriate. He denied SI/HI and denied hallucinations. He said he ready for discharge and medication working and no side effects to his medications. Pt interacting well with peers and staff, attending groups and seems to be doing well.. A: Writer encouraged and supported patient. Offered PRN Remeron for sleep. R: Pt received medication without difficulty, said it helped him sleep well last night. Pt tolerated medication. He remain safe at this time.

## 2011-11-21 NOTE — Progress Notes (Signed)
[facility] MD Progress Note  11/21/2011 2:45 PM  S: "I am feeling great. Looking forward to going home after discharge. I have learned some skills during group to help me cope better after here".  Diagnosis:   Axis I: Major depressive disorder, Cocaine abuse, Nicotine dependence. Axis II: Deferred Axis III:  Past Medical History  Diagnosis Date  . Hypertension   . MI (myocardial infarction)   . Anxiety   . GERD (gastroesophageal reflux disease)   . Gout    Axis IV: other psychosocial or environmental problems Axis V: 61-70 mild symptoms  ADL's:  Intact  Sleep: Good  Appetite:  Good  Suicidal Ideation:  Plan:  No Intent:  No Means: No  Homicidal Ideation:  Plan:  No Intent:  No Means:  no  AEB (as evidenced by): per patient's reports  Mental Status Examination/Evaluation: Objective:  Appearance: Casual  Eye Contact::  Good  Speech:  Clear and Coherent  Volume:  Normal  Mood:  Euthymic  Affect:  Appropriate  Thought Process:  Coherent and Intact  Orientation:  Full  Thought Content:  Rumination  Suicidal Thoughts:  No  Homicidal Thoughts:  No  Memory:  Immediate;   Good Recent;   Good Remote;   Good  Judgement:  Good  Insight:  Good  Psychomotor Activity:  Normal  Concentration:  Good  Recall:  Good  Akathisia:  No  Handed:  Right  AIMS (if indicated):     Assets:  Desire for Improvement  Sleep:  Number of Hours: 6    Vital Signs:Blood pressure 136/96, pulse 73, temperature 97.6 F (36.4 C), temperature source Oral, resp. rate 18, height 5' 5.5" (1.664 m), weight 68.947 kg (152 lb). Current Medications: Current Facility-Administered Medications  Medication Dose Route Frequency Provider Last Rate Last Dose  . acetaminophen (TYLENOL) tablet 650 mg  650 mg Oral Q6H PRN Jorje Guild, PA-C      . alum & mag hydroxide-simeth (MAALOX/MYLANTA) 200-200-20 MG/5ML suspension 30 mL  30 mL Oral Q4H PRN Jorje Guild, PA-C   30 mL at 11/21/11 1001  . aspirin chewable tablet  81 mg  81 mg Oral Daily Mike Craze, MD   81 mg at 11/21/11 0800  . buPROPion (WELLBUTRIN XL) 24 hr tablet 150 mg  150 mg Oral Daily Mike Craze, MD   150 mg at 11/21/11 0800  . lisinopril (PRINIVIL,ZESTRIL) tablet 10 mg  10 mg Oral Daily Mike Craze, MD   10 mg at 11/21/11 0800  . magnesium hydroxide (MILK OF MAGNESIA) suspension 30 mL  30 mL Oral Daily PRN Jorje Guild, PA-C      . mirtazapine (REMERON) tablet 15 mg  15 mg Oral QHS PRN Verne Spurr, PA-C   15 mg at 11/20/11 2223  . pantoprazole (PROTONIX) EC tablet 40 mg  40 mg Oral Q1200 Mike Craze, MD   40 mg at 11/21/11 1106  . traZODone (DESYREL) tablet 50 mg  50 mg Oral QHS PRN Jorje Guild, PA-C        Lab Results:  Results for orders placed during the hospital encounter of 11/20/11 (from the past 48 hour(s))  T3, FREE     Status: Normal   Collection Time   11/20/11  8:24 PM      Component Value Range Comment   T3, Free 2.9  2.3 - 4.2 pg/mL   T4, FREE     Status: Abnormal   Collection Time   11/20/11  8:24 PM  Component Value Range Comment   Free T4 0.74 (*) 0.80 - 1.80 ng/dL   TSH     Status: Normal   Collection Time   11/20/11  8:24 PM      Component Value Range Comment   TSH 3.646  0.350 - 4.500 uIU/mL     Physical Findings: AIMS: Facial and Oral Movements Muscles of Facial Expression: None, normal Lips and Perioral Area: None, normal Jaw: None, normal Tongue: None, normal,Extremity Movements Upper (arms, wrists, hands, fingers): None, normal Lower (legs, knees, ankles, toes): None, normal, Trunk Movements Neck, shoulders, hips: None, normal, Overall Severity Severity of abnormal movements (highest score from questions above): None, normal Incapacitation due to abnormal movements: None, normal, Dental Status Current problems with teeth and/or dentures?: No Does patient usually wear dentures?: Yes (Pt wears a partial)  CIWA:    COWS:  COWS Total Score: 1   Treatment Plan Summary: Daily contact with  patient to assess and evaluate symptoms and progress in treatment Medication management  Plan: Instructed patient on dangers of illegal drug use while on prescription medications.  Encouraged to abstain from other substance use to avoid serious drug interactions. Possible home discharge in am.                                              Continue current treatment plan.  Armandina Stammer I 11/21/2011, 2:45 PM

## 2011-11-21 NOTE — [facility] Suicide Risk Assessment (Signed)
Suicide Risk Assessment  Admission Assessment     Demographic factors:  Assessment Details Time of Assessment: Admission Information Obtained From: Patient Current Mental Status:  Current Mental Status:  (denies SI now) Loss Factors:  Loss Factors: Decline in physical health Historical Factors:  Historical Factors: Family history of mental illness or substance abuse Risk Reduction Factors:  Risk Reduction Factors: Sense of responsibility to family;Employed;Living with another person, especially a relative;Positive social support  CLINICAL FACTORS:   Severe Anxiety and/or Agitation Alcohol/Substance Abuse/Dependencies Previous Psychiatric Diagnoses and Treatments  COGNITIVE FEATURES THAT CONTRIBUTE TO RISK:  Closed-mindedness Thought constriction (tunnel vision)    SUICIDE RISK:   Moderate:  Frequent suicidal ideation with limited intensity, and duration, some specificity in terms of plans, no associated intent, good self-control, limited dysphoria/symptomatology, some risk factors present, and identifiable protective factors, including available and accessible social support.  Diagnosis:   Axis I: Major Depression, Recurrent severe, Cocaine Dependence, Nicotine Dependence Axis II: Deferred Axis III:  Past Medical History Diagnosis Date . Hypertension  . MI (myocardial infarction)  . Anxiety  . GERD (gastroesophageal reflux disease)  . Gout   Axis IV: other psychosocial or environmental problems Axis V: 41-50 serious symptoms  ADL's:  Intact  Sleep: Fair  Appetite:  Fair  Suicidal Ideation:  Pt denies any thoughts, plans, intent of suicide, but had been suicidal before admission. Homicidal Ideation:  Pt denies any thoughts, plans, intent of homicide  Mental Status Examination/Evaluation: Objective:  Appearance: Casual Eye Contact::  Good Speech:  Clear and Coherent Volume:  Normal Mood:  Anxious, Depressed, Hopeless, Irritable and Worthless Affect:   Congruent Thought Process:  Coherent Orientation:  Full Thought Content:  WDL Suicidal Thoughts:  No Homicidal Thoughts:  No Memory:  Immediate;   Fair Recent;   Fair Remote;   Fair Judgement:  Impaired Insight:  Lacking Psychomotor Activity:  Normal Concentration:  Fair Recall:  Fair Akathisia:  No Handed:  Left AIMS (if indicated):    Assets:  Communication Skills Desire for Improvement Sleep:  Number of Hours: 1.5   Vital Signs:Blood pressure 144/88, pulse 79, temperature 97.4 F (36.3 C), temperature source Oral, resp. rate 18, height 5' 5.5" (1.664 m), weight 68.947 kg (152 lb). Current Medications: Current Facility-Administered Medications Medication Dose Route Frequency Provider Last Rate Last Dose . acetaminophen (TYLENOL) tablet 650 mg  650 mg Oral Q6H PRN Jorje Guild, PA-C     . alum & mag hydroxide-simeth (MAALOX/MYLANTA) 200-200-20 MG/5ML suspension 30 mL  30 mL Oral Q4H PRN Jorje Guild, PA-C     . aspirin chewable tablet 81 mg  81 mg Oral Daily Mike Craze, MD   81 mg at 11/20/11 1824 . buPROPion (WELLBUTRIN XL) 24 hr tablet 150 mg  150 mg Oral Daily Mike Craze, MD   150 mg at 11/20/11 1254 . lisinopril (PRINIVIL,ZESTRIL) tablet 10 mg  10 mg Oral Daily Mike Craze, MD   10 mg at 11/20/11 1824 . magnesium hydroxide (MILK OF MAGNESIA) suspension 30 mL  30 mL Oral Daily PRN Jorje Guild, PA-C     . mirtazapine (REMERON) tablet 15 mg  15 mg Oral QHS PRN Verne Spurr, PA-C     . pantoprazole (PROTONIX) EC tablet 40 mg  40 mg Oral Q1200 Mike Craze, MD   40 mg at 11/20/11 1824 . traZODone (DESYREL) tablet 50 mg  50 mg Oral QHS PRN Jorje Guild, PA-C     . DISCONTD: nicotine (NICODERM CQ - dosed in mg/24  hours) patch 21 mg  21 mg Transdermal Q0600 Jorje Guild, PA-C      Facility-Administered Medications Ordered in Other Encounters Medication Dose Route Frequency Provider Last Rate Last Dose . LORazepam (ATIVAN) tablet 2 mg  2 mg Oral Once Geoffery Lyons, MD   2 mg at  11/19/11 2305   Lab Results:  Results for orders placed during the hospital encounter of 11/19/11 (from the past 48 hour(s)) URINE RAPID DRUG SCREEN (HOSP PERFORMED)     Status: Abnormal  Collection Time  11/19/11  5:57 PM     Component Value Range Comment  Opiates NONE DETECTED  NONE DETECTED   Cocaine POSITIVE (*) NONE DETECTED   Benzodiazepines NONE DETECTED  NONE DETECTED   Amphetamines NONE DETECTED  NONE DETECTED   Tetrahydrocannabinol NONE DETECTED  NONE DETECTED   Barbiturates NONE DETECTED  NONE DETECTED  CBC     Status: Normal  Collection Time  11/19/11  7:26 PM     Component Value Range Comment  WBC 7.6  4.0 - 10.5 K/uL   RBC 4.98  4.22 - 5.81 MIL/uL   Hemoglobin 15.4  13.0 - 17.0 g/dL   HCT 95.2  84.1 - 32.4 %   MCV 88.0  78.0 - 100.0 fL   MCH 30.9  26.0 - 34.0 pg   MCHC 35.2  30.0 - 36.0 g/dL   RDW 40.1  02.7 - 25.3 %   Platelets 274  150 - 400 K/uL  COMPREHENSIVE METABOLIC PANEL     Status: Abnormal  Collection Time  11/19/11  7:26 PM     Component Value Range Comment  Sodium 142  135 - 145 mEq/L   Potassium 4.4  3.5 - 5.1 mEq/L   Chloride 103  96 - 112 mEq/L   CO2 28  19 - 32 mEq/L   Glucose, Bld 87  70 - 99 mg/dL   BUN 13  6 - 23 mg/dL   Creatinine, Ser 6.64 (*) 0.50 - 1.35 mg/dL   Calcium 9.6  8.4 - 40.3 mg/dL   Total Protein 7.2  6.0 - 8.3 g/dL   Albumin 4.1  3.5 - 5.2 g/dL   AST 15  0 - 37 U/L   ALT 15  0 - 53 U/L   Alkaline Phosphatase 113  39 - 117 U/L   Total Bilirubin 0.3  0.3 - 1.2 mg/dL   GFR calc non Af Amer 41 (*) >90 mL/min   GFR calc Af Amer 48 (*) >90 mL/min  ETHANOL     Status: Normal  Collection Time  11/19/11  7:26 PM     Component Value Range Comment  Alcohol, Ethyl (B) <11  0 - 11 mg/dL  ACETAMINOPHEN LEVEL     Status: Normal  Collection Time  11/19/11  7:26 PM     Component Value Range Comment  Acetaminophen (Tylenol), Serum <15.0  10 - 30 ug/mL    Physical Findings: AIMS: Facial and Oral Movements Muscles of Facial  Expression: None, normal Lips and Perioral Area: None, normal Jaw: None, normal Tongue: None, normal,Extremity Movements Upper (arms, wrists, hands, fingers): None, normal Lower (legs, knees, ankles, toes): None, normal, Trunk Movements Neck, shoulders, hips: None, normal, Overall Severity Severity of abnormal movements (highest score from questions above): None, normal Incapacitation due to abnormal movements: None, normal, Dental Status Current problems with teeth and/or dentures?: No Does patient usually wear dentures?: Yes (Pt wears a partial)  CIWA:    COWS:  COWS Total Score: 1  Risk: Risk of harm to self is elevated by his mood, anxiety, and addiction difficulties  Risk of harm to others is minimal in that he has not been involved in fights or had any legal charges filed on him.  Treatment Plan Summary: Daily contact with patient to assess and evaluate symptoms and progress in treatment Medication management  Plan: Admit, continue home medications, use Remeron and Trazodone for sedation.  Refer for long term rehab.  Discussed the risks, benefits, and probable clinical course with and without treatment.  Pt is agreeable to the current course of treatment. We will continue on q. 15 checks the unit protocol. At this time there is no clinical indication for one-to-one observation as patient contract for safety and presents little risk to harm themself and others.  We will increase collateral information. I encourage patient to participate in group milieu therapy. Pt will be seen in treatment team soon for further treatment and appropriate discharge planning. Please see history and physical note for more detailed information ELOS: 3 to 5 days.    Billy Cisneros 11/20/2011, 8:52 PM

## 2011-11-21 NOTE — Progress Notes (Signed)
[facility] Adult Inpatient Family/Significant Other Suicide Prevention Education  Suicide Prevention Education:  Education Completed; Billy Cisneros, wife, (face-to-face) has been identified by the patient as the family member/significant other with whom the patient will be residing, and identified as the person(s) who will aid the patient in the event of a mental health crisis (suicidal ideations/suicide attempt).  With written consent from the patient, the family member/significant other has been provided the following suicide prevention education, prior to the and/or following the discharge of the patient.  The suicide prevention education provided includes the following:  Suicide risk factors  Suicide prevention and interventions  National Suicide Hotline telephone number  [facility] assessment telephone number  [facility] Emergency Assistance 911  [facility] and/or Residential Mobile Crisis Unit telephone number  Request made of family/significant other to:  Remove weapons (e.g., guns, rifles, knives), all items previously/currently identified as safety concern.    Remove drugs/medications (over-the-counter, prescriptions, illicit drugs), all items previously/currently identified as a safety concern.  Billy Cisneros stated that she is very concerned about Billy Cisneros because he has been depressed for such a long time. Although she has not seen him deal with depression like this since they were married, she reports that he has stated he was depressed after his wife left and took their daughter to [facility] over 20 years ago. Billy Cisneros indicated that she had known Billy Cisneros was not getting out of bed, but since she was at work she had not known that he had not been going to work for over a week. 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 She stated that she has been trying to get him to come to the  hospital for over a week, and that she would like for him to stay for a few days although he would like to go home right away. She was concerned that he had not talked to her about his suicidal plans, and they shocked her when she heard them in the ED. Billy Cisneros verbalized understanding of suicide prevention information and had no other questions about this. She reported that he does not have access to firearms. Billy Cisneros did want to know if Billy Cisneros is being given his medications at the right time, as he works at night and sleeps during the day.   Billy Cisneros 11/21/2011, 4:12 PM

## 2011-11-21 NOTE — Progress Notes (Signed)
Psychoeducational Group Note  Date:  11/21/2011 Time:  1100  Group Topic/Focus:  Relapse Prevention Planning:   The focus of this group is to define relapse and discuss the need for planning to combat relapse.  Participation Level:  None  Participation Quality:  Pt did not participate  Affect:  Appropriate  Cognitive:  Appropriate  Insight:  pt did not participate in group  Engagement in Group:  None  Additional Comments:  Pt attended group but did not participate.   Dalia Heading 11/21/2011, 1:18 PM

## 2011-11-21 NOTE — Progress Notes (Signed)
11/21/2011         Time: 1500      Group Topic/Focus: The focus of this group is on enhancing patients' problem solving skills, which involves identifying the problem, brainstorming solutions and choosing and trying a solution.    Participation Level: Active  Participation Quality: Appropriate, Attentive and Supportive  Affect: Appropriate  Cognitive: Oriented   Additional Comments: Patient reports he is feeling "100% better." Patient bright, joking with staff throughout group.  Zurri Rudden 11/21/2011 3:47 PM

## 2011-11-21 NOTE — Progress Notes (Signed)
Psychoeducational Group Note  Date:  11/20/2011 Time:  2220  Group Topic/Focus:  Wrap-Up Group:   The focus of this group is to help patients review their daily goal of treatment and discuss progress on daily workbooks.  Participation Level:  Minimal  Participation Quality:  Appropriate, Attentive and Supportive  Affect:  Appropriate and Excited  Cognitive:  Appropriate  Insight:  Limited  Engagement in Group:  Limited  Additional Comments:  Pt attended karaoke this evening.  He was enthusiastic and supportive of peers in [place] group.  Aundria Rud, Kejon Feild L 11/21/2011, 2:22 AM

## 2011-11-21 NOTE — Progress Notes (Signed)
Patient seen during d/c planning group.  He reports being much better today and denies SI/HI.  He rates all symptoms at zero and is hopeful to discharge home soon.  Patient reports having transportation home.

## 2011-11-22 MED ORDER — LISINOPRIL 10 MG PO TABS: 10.0000 mg | ORAL_TABLET | Freq: Every day | ORAL | Status: DC

## 2011-11-22 MED ORDER — OMEPRAZOLE 20 MG PO CPDR: 20.0000 mg | DELAYED_RELEASE_CAPSULE | Freq: Every day | ORAL | Status: DC

## 2011-11-22 MED ORDER — BUPROPION HCL ER (XL) 150 MG PO TB24: 150.0000 mg | ORAL_TABLET | Freq: Every day | ORAL | Status: DC

## 2011-11-22 MED ORDER — MIRTAZAPINE 15 MG PO TABS: 15.0000 mg | ORAL_TABLET | Freq: Every evening | ORAL | Status: DC | PRN

## 2011-11-22 MED ORDER — TRAZODONE HCL 50 MG PO TABS: 50.0000 mg | ORAL_TABLET | Freq: Every evening | ORAL | Status: DC | PRN

## 2011-11-22 MED ORDER — ASPIRIN EC 81 MG PO TBEC: 81.0000 mg | DELAYED_RELEASE_TABLET | Freq: Every day | ORAL | Status: AC

## 2011-11-22 NOTE — Progress Notes (Signed)
Patient ID: Billy Cisneros, male   DOB: 27-Jul-1950, 61 y.o.   MRN: [id number] Patient bright and appropriate at d/c process. Reviewed all f/u instruction and medication information. Patient verbalized understanding. Denies SI. All belongings returned with medication samples provided. Escorted to lobby to care of family.

## 2011-11-22 NOTE — Progress Notes (Signed)
[facility] Group Notes:  (Counselor/Nursing/MHT/Case Management/Adjunct)  11/22/2011 12:11 AM  Type of Therapy:  Psychoeducational Skills  Participation Level:  Active  Participation Quality:  Appropriate  Affect:  Appropriate  Cognitive:  Appropriate  Insight:  Good  Engagement in Group:  Good  Engagement in Therapy:  Good  Modes of Intervention:  Education  Summary of Progress/Problems: The pt. Verbalized in group that he is feeling better and had a good day. His goal for tomorrow is to continue on working to "come out of my shell". The pt.was quite talkative in group.    Sarath Privott S 11/22/2011, 12:11 AM

## 2011-11-22 NOTE — [facility] Suicide Risk Assessment (Signed)
Suicide Risk Assessment  Discharge Assessment     Demographic factors:  Male;Caucasian    Current Mental Status Per Nursing Assessment::   On Admission:   (denies SI now) At Discharge:     Current Mental Status Per Physician: Patient was awake, alert, oriented x 4. He has normal psychomotor activity and speech. He has denied suicidal or homicidal ideations. He has plans of going back to work as a [employer] and does not want to be sleepy. He has fair insight, judgment and impulse control.   Loss Factors: Decline in physical health  Historical Factors: Family history of mental illness or substance abuse  Risk Reduction Factors:      Continued Clinical Symptoms:  Depression:   Anhedonia Comorbid alcohol abuse/dependence Hopelessness Impulsivity Insomnia Alcohol/Substance Abuse/Dependencies  Discharge Diagnoses:   AXIS I:  Major Depression, Recurrent severe, Substance Abuse, Substance Induced Mood Disorder and cocaine and nicotine dependence AXIS II:  Deferred AXIS III:   Past Medical History  Diagnosis Date  . Hypertension   . MI (myocardial infarction)   . Anxiety   . GERD (gastroesophageal reflux disease)   . Gout    AXIS IV:  occupational problems, other psychosocial or environmental problems and problems related to social environment AXIS V:  61-70 mild symptoms  Cognitive Features That Contribute To Risk:  Loss of executive function Polarized thinking    Suicide Risk:  Minimal: No identifiable suicidal ideation.  Patients presenting with no risk factors but with morbid ruminations; may be classified as minimal risk based on the severity of the depressive symptoms  Plan Of Care/Follow-up recommendations:  Activity:  Regular  Diet:  Normal   Billy Cisneros,Billy R. 11/22/2011, 11:16 AM

## 2011-11-22 NOTE — Progress Notes (Signed)
Psychoeducational Group Note  Date:  11/22/2011 Time:  1015  Group Topic/Focus:  Identifying Needs:   The focus of this group is to help patients identify their personal needs that have been historically problematic and identify healthy behaviors to address their needs.  Participation Level:  Active  Participation Quality:  Appropriate  Affect:  Excited  Cognitive:  Alert  Insight:  Good  Engagement in Group:  Good  Additional Comments:    Cresenciano Lick 11/22/2011, 2:00 PM

## 2011-11-22 NOTE — Progress Notes (Signed)
[facility] Case Management Discharge Plan:  Will you be returning to the same living situation after discharge: Yes,  home At discharge, do you have transportation home?:Yes,  wife Do you have the ability to pay for your medications:Yes,    Interagency Information:     Release of information consent forms completed and in the chart;  Patient's signature needed at discharge.  Patient to Follow up at:  Follow-up Information    Follow up with Ollen Gross [facility] on 11/24/2011. (You are scheduled with Ollen Gross at 2:30 on Monday, November 24 2011)    Contact information:   [address] [place], [place]  [address]  [phone]         Patient denies SI/HI:   Yes,      Safety Planning and Suicide Prevention discussed:  Yes,  with wife and with pt.  Barrier to discharge identified:No.  Summary and Recommendations: Pt was cleared for D/C and denies any and all S/I and H/I.  Pt is requesting a note for work.  [facility] 11/22/2011, 10:08 AM

## 2011-11-22 NOTE — Progress Notes (Signed)
Patient ID: Billy Cisneros, male   DOB: October 24, 1950, 61 y.o.   MRN: [id number] Pt. attended and participated in aftercare planning group. Pt. accepted information on suicide prevention, warning signs to look for with suicide and crisis line numbers to use. The pt. agreed to call crisis line numbers if having warning signs or having thoughts of suicide. Pt. listed their current anxiety level as 1 and depression level as a 1 on a scale of 1 to 10 with 10 being the high.   1

## 2011-11-25 ENCOUNTER — Other Ambulatory Visit (HOSPITAL_COMMUNITY): Payer: Self-pay

## 2011-11-25 NOTE — Progress Notes (Signed)
Patient Discharge Instructions:  After Visit Summary (AVS):   Faxed to:  11/25/2011 Psychiatric Admission Assessment Note:   Faxed to:  11/25/2011 Suicide Risk Assessment - Discharge Assessment:   Faxed to:  11/25/2011 Faxed/Sent to the Next Level Care provider:  11/25/2011  Faxed to [facility] @ [phone]  Wandra Scot, 11/25/2011, 6:45 PM

## 2011-11-26 MED ORDER — BUPROPION HCL ER (XL) 150 MG PO TB24: 150.0000 mg | ORAL_TABLET | Freq: Every day | ORAL | Status: DC

## 2011-11-26 MED ORDER — TRAZODONE HCL 50 MG PO TABS: 50.0000 mg | ORAL_TABLET | Freq: Every evening | ORAL | Status: DC | PRN

## 2011-11-26 MED ORDER — MIRTAZAPINE 15 MG PO TABS: 15.0000 mg | ORAL_TABLET | Freq: Every evening | ORAL | Status: DC | PRN

## 2011-12-10 NOTE — Discharge Summary (Signed)
Physician Discharge Summary Note  Patient:  Billy Cisneros is an 61 y.o., male MRN:  [id number] DOB:  03/18/1951 Patient phone:  [phone] (home)  Patient address:   [address] [place] [address]   Date of Admission:  11/20/2011 Date of Discharge: 11/22/2011  Discharge Diagnoses: Active Problems:  Major depression  Cocaine dependence  Nicotine dependence  Axis Diagnosis:  AXIS I: Major Depression, Recurrent severe, Substance Abuse, Substance Induced Mood Disorder and cocaine and nicotine dependence  AXIS II: Deferred  AXIS III:  Past Medical History   Diagnosis  Date   .  Hypertension    .  MI (myocardial infarction)    .  Anxiety    .  GERD (gastroesophageal reflux disease)    .  Gout     AXIS IV: occupational problems, other psychosocial or environmental problems and problems related to social environment  AXIS V: 61-70 mild symptoms   Level of Care:  OP  Hospital Course:   This is a 61 year old Caucasian male, admitted to [facility] from the [facility] ED with complaints of increased depression and suicidal thoughts. Patient reports, "I came to [facility] because I felt it is better than [facility]. I have been feeling very depressed for sometime. It seems like I cannot concentrate on anything to save my life. Everything seem to be out of wack. I don't know why I am feeling this way because my home life is good, my job is going well and there are no financial and or health issues. I don't feel like doing anything but to lie down in bed and sleep all day. This has been going on for a while, but worsening for the past 2 months. tried to let my friends and family know about how I am feeling, but everybody keep brushing it off like I am bluffing. The only thing that worries me most of the time is my daughter. She lives in [name] with her family. She is 60 years old, but she still is my child. She is doing well, but I cannot help but to worry about  her. [place] is big and busy. It is true and obvious that people can actually get lost and no one will notice. I went to my primary care physician 2 months ago. I informed him that I am very depressed. He started me on Cymbalta. Cymbalta is not helping my depression, rather I have seen that it has so many side effects. It is making me feel more anxious and jerky. I started feeling suicidal and even have thoughts about hanging my self in the barn and or swallow bunch of pills".  While a patient in this hospital, Mr. Needle received medication management for depression. They were ordered and received Wellbutrin for depression and Remeron and Trazodone for insomnia. They were also enrolled in group counseling sessions and activities in which they participated actively.   Patient attended treatment team meeting this am and met with treatment team members. Pt symptoms, treatment plan and response to treatment discussed. Mr. Feigel endorsed that their symptoms have improved. Pt also stated that they are stable for discharge.  They reported that from this hospital stay they had learned that they must take care of themselves.  In other to maintain their mood and sleep wake cycle, they will continue psychiatric care on outpatient basis. They will follow-up at [facility] with Ollen Gross on 7/15 at 1430.   Upon discharge, patient  adamantly denies suicidal, homicidal ideations, auditory, visual hallucinations and or delusional thinking. They left [facility] with all personal belongings via personal transportation in no apparent distress.  Consults:  None  Significant Diagnostic Studies:  labs: Free T4 low at 0.74 and Creatinine elevated at 1.74 otherwise Vitamin D level, CBC, THS and T3 and CMET and BAL non contributory  Discharge Vitals:   Blood pressure 155/94, pulse 81, temperature 96.9 F (36.1 C), temperature source Oral, resp. rate 18, height 5' 5.5" (1.664 m), weight 68.947 kg (152  lb)..  Mental Status Exam: See Mental Status Examination and Suicide Risk Assessment completed by Attending Physician prior to discharge.  Discharge destination:  Home  Is patient on multiple antipsychotic therapies at discharge:  No  Has Patient had three or more failed trials of antipsychotic monotherapy by history: N/A Recommended Plan for Multiple Antipsychotic Therapies: N/A Discharge Orders    Future Orders Please Complete By Expires   Diet - low sodium heart healthy      Increase activity slowly      Discharge instructions      Comments:   Take all of your medications as prescribed.  Be sure to keep ALL follow up appointments as scheduled. This is to ensure getting your refills on time to avoid any interruption in your medication.  If you find that you can not keep your appointment, call the clinic and reschedule. Be sure to tell the nurse if you will need a refill before your appointment.     Medication List  As of 12/10/2011 11:31 PM   STOP taking these medications         ALPRAZolam 0.25 MG tablet      DULoxetine 60 MG capsule      zolpidem 10 MG tablet         TAKE these medications      Indication    acyclovir 800 MG tablet   Commonly known as: ZOVIRAX   Take 800 mg by mouth 2 (two) times daily as needed. For rash       aspirin EC 81 MG tablet   Take 1 tablet (81 mg total) by mouth daily. For heart attack prevention.       buPROPion 150 MG 24 hr tablet   Commonly known as: WELLBUTRIN XL   Take 1 tablet (150 mg total) by mouth daily. For depression.    Indication: Major Depressive Disorder      lisinopril 10 MG tablet   Commonly known as: PRINIVIL,ZESTRIL   Take 1 tablet (10 mg total) by mouth daily. For elevated blood pressure.       mirtazapine 15 MG tablet   Commonly known as: REMERON   Take 1 tablet (15 mg total) by mouth at bedtime as needed (sleep).    Indication: Trouble Sleeping      omeprazole 20 MG capsule   Commonly known as: PRILOSEC   Take  1 capsule (20 mg total) by mouth daily. For reflux.    Indication: Esophagus Inflammation with Erosion, Gastroesophageal Reflux Disease with Current Symptoms      traZODone 50 MG tablet   Commonly known as: DESYREL   Take 1 tablet (50 mg total) by mouth at bedtime as needed for sleep.    Indication: Trouble Sleeping           Follow-up Information    Follow up with Ollen Gross - [facility] on 11/24/2011. (You are scheduled with Ollen Gross at 2:30 on Monday, November 24 2011)  Contact information:   [address] [place], [place]  [address]  [phone]        Follow-up recommendations:   Activities: Resume typical activities Diet: Resume typical diet Other: Follow up with outpatient provider and report any side effects to out patient prescriber.  Comments:  Take all your medications as prescribed by your mental healthcare provider. Report any adverse effects and or reactions from your medicines to your outpatient provider promptly. Patient is instructed and cautioned to not engage in alcohol and or illegal drug use while on prescription medicines. In the event of worsening symptoms, patient is instructed to call the crisis hotline, 911 and or go to the nearest ED for appropriate evaluation and treatment of symptoms.  SignedOrson Aloe 12/10/2011 11:31 PM

## 2012-03-02 ENCOUNTER — Ambulatory Visit: Payer: Self-pay | Admitting: Gastroenterology

## 2012-03-02 LAB — COMPREHENSIVE METABOLIC PANEL
Alkaline Phosphatase: 124 U/L (ref 50–136)
Anion Gap: 10 (ref 7–16)
BUN: 12 mg/dL (ref 7–18)
Bilirubin,Total: 0.8 mg/dL (ref 0.2–1.0)
Co2: 25 mmol/L (ref 21–32)
Creatinine: 1.69 mg/dL — ABNORMAL HIGH (ref 0.60–1.30)
Glucose: 97 mg/dL (ref 65–99)
Osmolality: 285 (ref 275–301)
SGOT(AST): 19 U/L (ref 15–37)
SGPT (ALT): 23 U/L (ref 12–78)

## 2012-03-02 LAB — TROPONIN I: Troponin-I: <0.02 ng/mL

## 2012-03-02 LAB — CBC
HCT: 45 % (ref 40.0–52.0)
HGB: 15.8 g/dL (ref 13.0–18.0)
MCH: 31.1 pg (ref 26.0–34.0)
MCV: 89 fL (ref 80–100)
Platelet: 281 10*3/uL (ref 150–440)
RBC: 5.07 10*6/uL (ref 4.40–5.90)
WBC: 9.6 10*3/uL (ref 3.8–10.6)

## 2012-09-09 LAB — COMPREHENSIVE METABOLIC PANEL
Albumin: 3.8 g/dL (ref 3.4–5.0)
Alkaline Phosphatase: 105 U/L (ref 50–136)
Chloride: 108 mmol/L — ABNORMAL HIGH (ref 98–107)
EGFR (Non-African Amer.): 44 — ABNORMAL LOW
Glucose: 90 mg/dL (ref 65–99)
Osmolality: 279 (ref 275–301)
Potassium: 3.8 mmol/L (ref 3.5–5.1)
SGOT(AST): 22 U/L (ref 15–37)
SGPT (ALT): 29 U/L (ref 12–78)
Sodium: 139 mmol/L (ref 136–145)

## 2012-09-09 LAB — CBC
HCT: 41.1 % (ref 40.0–52.0)
MCH: 31.1 pg (ref 26.0–34.0)
MCHC: 35.3 g/dL (ref 32.0–36.0)
Platelet: 231 10*3/uL (ref 150–440)
WBC: 6.5 10*3/uL (ref 3.8–10.6)

## 2012-09-09 LAB — TROPONIN I: Troponin-I: <0.02 ng/mL

## 2012-09-10 ENCOUNTER — Observation Stay: Payer: Self-pay | Admitting: Internal Medicine

## 2012-09-10 LAB — CK TOTAL AND CKMB (NOT AT ARMC)
CK, Total: 78 U/L (ref 35–232)
CK-MB: <0.5 ng/mL — ABNORMAL LOW (ref 0.5–3.6)

## 2013-09-06 ENCOUNTER — Emergency Department: Payer: Self-pay | Admitting: Emergency Medicine

## 2014-09-01 NOTE — H&P (Signed)
PATIENT NAME:  Billy Cisneros, Billy Cisneros MR#:  [id number] DATE OF BIRTH:  1951-03-17  DATE OF ADMISSION:  09/10/2012  PRIMARY CARE PHYSCIAN:  Dr. Einar Crow.  REFERRING PHYSICIAN:  Dr. Maricela Bo.  CHIEF COMPLAINT:  Chest pain.   HISTORY OF PRESENT ILLNESS:  The patient is a 64 year old pleasant white male with past medical history of coronary artery disease status post CABG 13 years back, presented to the Emergency Department with complaints of chest pain on the left side of the chest.  The patient states he woke up around 2 in the morning and after that started to experience chest pain on the left side of the chest pain, radiating to the back and radiating to the left arm.  The pain is worse taking deep breath or with any movement.  The patient has been getting this pain more frequently, especially when lifting some heavy weight.  The patient waited all day long hoping the pain will go away.  However, as the patient continued to have this pain, concerned about this and came to the Emergency Department.  In the Emergency Department work-up and EKG and cardiac enzymes were unremarkable.  The patient states has been experiencing shortness of breath, lightheadedness.  This is a sharp in nature, 8 x 10 in intensity.  The patient had admission in March 2013 with similar complaints.  Underwent stress test which was unremarkable.  Had preserved left ventricular function.   PAST MEDICAL HISTORY: 1.  Hypertension.  2.  Hyperlipidemia.  3.  Coronary artery disease status post CABG.  4.  Gastroesophageal reflux disease.  5.  Chronic kidney disease. 6.  Esophageal stricture status post dilatation. 7.  Anxiety.   PAST SURGICAL HISTORY:  Coronary artery bypass grafting about 14 years back.   ALLERGIES:  To TOPROL causing the weakness .   HOME MEDICATIONS: 1.  Xanax 0.25 mg 2 times a day as needed.  2.  Wellbutrin SR 150 mg 2 times a day.  3.  Simvastatin 40 mg once a day.  4.  Prilosec 40 mg once a  day.  5.  Lisinopril 10 mg once a day.  6.  Aspirin 81 mg 1 tablet once a day.   SOCIAL HISTORY:  No history of smoking, drinking alcohol or using illicit drugs.  Married, lives with his wife, works as a Naval architect.   FAMILY HISTORY:  Father died of lung cancer.  Mother is in her 97s and healthy.   REVIEW OF SYSTEMS: CONSTITUTIONAL:  Has generalized fatigue.  No weight loss.  EYES:  No change in vision, no redness.  EARS, NOSE, THROAT:  No change in hearing or tinnitus.  RESPIRATORY:  No cough, shortness of breath.  CARDIOVASCULAR:  Complains of chest pain.  No pedal edema.  GASTROINTESTINAL:  Good appetite, having regular bowel movements.  GENITOURINARY:  No dysuria or hematuria.  SKIN:  No rash or lesions.  MUSCULOSKELETAL:  No joint swelling or pain.  ENDOCRINE:  No polyphagia or polydipsia.  NEUROLOGIC:  No weakness in any part of the body or numbness.  PSYCHIATRIC:  Has a history of depression.   PHYSICAL EXAMINATION: GENERAL:  This is a well-built, well-nourished, age-appropriate male, lying down in the bed, not in distress.  VITAL SIGNS:  Temperature 98, pulse 73, blood pressure 132/77, respiratory rate of 18, oxygen saturations 94% on room air.  HEENT:  Head normocephalic, atraumatic.  Eyes, no scleral icterus.  Conjunctivae normal.  Pupils equal and react to light.  Mucous membranes moist.  NECK:  Supple.  No lymphadenopathy.  No JVD.  No carotid bruit.  CHEST:  Has focal tenderness on the left side of the chest.  LUNGS:  Bilaterally clear to auscultation.  HEART:  S1 and S2 regular.  No murmurs are heard.  No pedal edema.  Has dorsalis pedis bilateral positive.  ABDOMEN:  Bowel sounds plus.  Soft, nontender, nondistended.  No hepatosplenomegaly.  SKIN:  No rash or lesions.  LYMPHATIC:  No cervical, axillary or inguinal lymphadenopathy.  NEUROLOGIC:  The patient is alert, oriented to place, person and time.  Cranial nerves II through XII intact.  Motor 5 by 5 in upper and  lower extremities.   LABORATORY AND DIAGNOSTIC DATA:  CBC is completely within normal limits.  CMP, BUN 70, creatinine of 1.65.  The rest of all the values are within normal limits.  Troponin less than 0.02 x 2.   EKG, 12-lead:  Normal sinus rhythm with no ST-T wave abnormalities.   ASSESSMENT AND PLAN:  The patient is a 64 year old male who comes to the Emergency Department with complaints of chest pain.  1.  Chest pain.  This is musculoskeletal pain.  However, considering patient's risk factors, we will admit the patient to a monitored unit.  Cycle cardiac enzymes x 3.  If negative, patient could be discharged home.  2.  Hypertension, currently well-controlled.  Continue the home medications.  3.  Chronic renal insufficiency, seems to be stable.  4.  Keep the patient on deep vein thrombosis prophylaxis with Lovenox.   TIME SPENT:  45 minutes.     ____________________________ Susa GriffinsPadmaja Devaughn Savant, MD pv:ea D: 09/10/2012 02:45:16 ET T: 09/10/2012 04:01:35 ET JOB#: [id number]  cc: Susa GriffinsPadmaja Yalitza Teed, MD, <Dictator> Marya AmslerMarshall W. Dareen PianoAnderson, MD Clerance LavPADMAJA Thedora Rings MD ELECTRONICALLY SIGNED 09/13/2012 6:55

## 2014-09-03 NOTE — H&P (Signed)
PATIENT NAME:  Billy Cisneros, Billy Cisneros MR#:  [id number] DATE OF BIRTH:  1950-11-04  DATE OF ADMISSION:  08/05/2011  PRIMARY CARE PHYSICIAN: Einar Crow, MD    CARDIOLOGIST: [facility]    CHIEF COMPLAINT: Chest pain.   HISTORY OF PRESENT ILLNESS: This is a 64 year old man who has a history of heart disease with a CABG 13 years ago. He presents to the ER with a few days of sharp chest pains in the left upper chest radiating into his armpit lasting 30 to 40 seconds, rated 5 to 6 out of 10 in intensity, and then when he lies down he gets chest pressure feeling like somebody sitting on his chest which can be very severe, relieved when he sits up. He cannot get comfortable while he is trying to sleep and this has been going on every night for the past few nights. He has also been sweating, occasional shortness of breath. No nausea or vomiting. He has been very fatigued. He is also having some discomfort in his arm and neck. These are similar symptoms to what he had 13 years ago. When the patient went to see Dr. Dareen Piano today, he referred him in to the ER. Hospitalist services were contacted for further evaluation.   PAST MEDICAL HISTORY:  1. Coronary artery disease.  2. Hypertension.  3. Hyperlipidemia.  4. Gastroesophageal reflux disease. 5. Chronic kidney disease.  6. Allergies. 7. Esophageal stricture. 8. Anxiety/  PAST SURGICAL HISTORY: CABG, five vessels, 13 years ago.   ALLERGIES: Toprol causes weakness and Uloric.   MEDICATIONS:  1. Xanax 0.25 mg b.i.d.  2. Aspirin 81 mg daily.  3. Lisinopril 10 mg daily.  4. Prilosec 40 mg in the a.m.  5. Simvastatin 40 mg in the p.m.  6. Ambien 10 mg at bedtime p.r.n.   SOCIAL HISTORY: No smoking. No alcohol. No drug use. 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   FAMILY HISTORY: Father died of lung cancer. Mother is 20 and healthy.   REVIEW OF SYSTEMS: CONSTITUTIONAL: Positive for sweating. No fever or chills. Positive  for weakness. No weight loss. No weight gain. EYES: He does wear glasses. EARS, NOSE, MOUTH, AND THROAT: Decreased hearing. Positive for runny nose. Occasional dysphagia to solids. CARDIOVASCULAR: Positive for chest pain. Positive for palpitations. RESPIRATORY: Positive for shortness of breath. Occasional cough. No sputum. No hemoptysis. GASTROINTESTINAL: Positive for diarrhea. No nausea. No vomiting. No abdominal pain. No bright red blood per rectum. No melena. GENITOURINARY: No burning on urination. No hematuria. MUSCULOSKELETAL: Positive for joint pain. INTEGUMENTARY: Occasional rash on the buttock. NEUROLOGIC: Feels lightheaded. INTEGUMENTARY: No rashes or eruptions. NEUROLOGIC: No fainting or blackouts. PSYCHIATRIC: Positive for anxiety. ENDOCRINE: No thyroid problems. HEMATOLOGIC/LYMPHATIC: No anemia. No easy bruising or bleeding.   PHYSICAL EXAMINATION:   VITAL SIGNS: Temperature 97.6, pulse 79, respirations 22, blood pressure 140/82, pulse oximetry 98%.   GENERAL: No respiratory distress.   EYES: Conjunctivae and lids normal. Pupils equal, round, and reactive to light. Extraocular muscles intact. No nystagmus.   EARS, NOSE, MOUTH, AND THROAT: Tympanic membrane no erythema. Nasal mucosa no erythema. Throat no erythema. No exudate seen. Lips and gums no lesions.   NECK: No JVD. No bruits. No lymphadenopathy. No thyromegaly. No thyroid nodules palpated.   RESPIRATORY: Lungs clear to auscultation. No use of accessory muscles to breathe. No rhonchi, rales, or wheeze heard.   CARDIOVASCULAR: S1, S2 normal. No gallops, rubs, or murmurs heard. Carotid upstroke 2+ bilaterally. No bruits.   EXTREMITIES:  Dorsalis pedis pulses 2+ bilaterally. No edema of the lower extremity.   ABDOMEN: Soft, nontender. No organomegaly/splenomegaly. Normoactive bowel sounds. No masses felt.  LYMPHATIC: No lymph nodes in the neck.   MUSCULOSKELETAL: No clubbing, edema, or cyanosis.   SKIN: No rashes or ulcers  seen.   NEUROLOGIC: Cranial nerves II through XII grossly intact. Deep tendon reflexes 2+ bilateral lower extremities.   PSYCHIATRIC: The patient is oriented to person, place, and time.   LABORATORY AND RADIOLOGICAL DATA: Chest x-ray negative. Cardiac enzymes negative. White blood cell count 6.9, hemoglobin and hematocrit 15.1 and 43.7, platelet count 202, glucose 100, BUN 11, creatinine 1.54, sodium 145, potassium 4.2, chloride 110, CO2 23, calcium 8.9. Liver function tests normal range. GFR 49.   Looking back at old procedures, the patient has had a cardiac cath in December 2011 that did show blockages but nothing that required a stent at that time.   ASSESSMENT AND PLAN:  1. Atypical chest pain with history of coronary artery disease. Will get serial cardiac enzymes. Put on telemetry. Will get a stress test in the a.m. if enzymes stay negative. Will ask Cardiology to see. Will give one dose of Lovenox now to cover overnight. The patient already takes aspirin. The patient is allergic to beta-blockers.  2. Hypertension. Continue the patient's lisinopril. Blood pressure is borderline right now. Will continue to monitor.  3. Hyperlipidemia. Continue Zocor. Check a lipid profile in the a.m.  4. Gastroesophageal reflux disease. Continue Prilosec.  5. Anxiety. Continue Xanax.  TIME SPENT ON ADMISSION: 50 minutes. The patient will be admitted as an observation.   ____________________________ Herschell Dimesichard J. Renae GlossWieting, MD rjw:drc D: 08/05/2011 16:56:39 ET T: 08/05/2011 17:20:02 ET JOB#: [id number]  cc: Herschell Dimesichard J. Renae GlossWieting, MD, <Dictator> Marya AmslerMarshall W. Dareen PianoAnderson, MD Salley ScarletICHARD J Mykelle Cockerell MD ELECTRONICALLY SIGNED 08/06/2011 21:01

## 2014-10-19 ENCOUNTER — Other Ambulatory Visit: Payer: Self-pay

## 2014-10-19 MED ORDER — BUPROPION HCL ER (XL) 300 MG PO TB24
300.0000 mg | ORAL_TABLET | Freq: Every day | ORAL | Status: DC
Start: 2014-10-19 — End: 2015-01-08

## 2014-10-19 MED ORDER — OMEPRAZOLE 20 MG PO CPDR: 20.0000 mg | DELAYED_RELEASE_CAPSULE | Freq: Two times a day (BID) | ORAL | Status: DC

## 2014-10-19 NOTE — Telephone Encounter (Signed)
Refill request from [facility]

## 2014-10-20 ENCOUNTER — Other Ambulatory Visit: Payer: Self-pay

## 2014-10-20 DIAGNOSIS — G4700 Insomnia, unspecified: Secondary | ICD-10-CM

## 2014-10-20 MED ORDER — ZOLPIDEM TARTRATE 10 MG PO TABS: 10.0000 mg | ORAL_TABLET | Freq: Every day | ORAL | Status: DC

## 2014-10-20 NOTE — Telephone Encounter (Signed)
Patient needs an appointment after this refill for follow-up of insomnia.

## 2014-10-20 NOTE — Telephone Encounter (Signed)
Requested from Patients pharm from fax

## 2014-10-27 ENCOUNTER — Telehealth: Payer: Self-pay | Admitting: Family Medicine

## 2014-10-27 NOTE — Telephone Encounter (Signed)
Okay thank you

## 2014-10-27 NOTE — Telephone Encounter (Signed)
Pt has upcoming appointment for December 04, 2014 @ 330. He is requesting a refill on Zolpidem please send to [facility]. Please return patient call at [phone] once this is complete.

## 2014-11-10 ENCOUNTER — Other Ambulatory Visit: Payer: Self-pay

## 2014-11-17 ENCOUNTER — Other Ambulatory Visit: Payer: Self-pay | Admitting: Family Medicine

## 2014-11-17 MED ORDER — LISINOPRIL 10 MG PO TABS: 10.0000 mg | ORAL_TABLET | Freq: Every day | ORAL | Status: DC

## 2014-11-17 NOTE — Telephone Encounter (Signed)
Refilled medication patient has appt on 12/04/14

## 2014-11-20 ENCOUNTER — Telehealth: Payer: Self-pay | Admitting: Family Medicine

## 2014-11-20 ENCOUNTER — Other Ambulatory Visit: Payer: Self-pay | Admitting: Family Medicine

## 2014-11-20 MED ORDER — LISINOPRIL 10 MG PO TABS: 10.0000 mg | ORAL_TABLET | Freq: Every day | ORAL | Status: DC

## 2014-11-20 NOTE — Telephone Encounter (Signed)
Medication has been refilled and [facility] to [facility]. I had to resend script pharmacy said they did not receive refill. I resent on 7/11/216

## 2014-11-22 NOTE — Telephone Encounter (Signed)
ERRENOUS °

## 2014-11-28 ENCOUNTER — Other Ambulatory Visit: Payer: Self-pay | Admitting: Family Medicine

## 2014-11-28 MED ORDER — ZOLPIDEM TARTRATE 10 MG PO TABS: 10.0000 mg | ORAL_TABLET | Freq: Every evening | ORAL | Status: DC | PRN

## 2014-11-28 NOTE — Telephone Encounter (Signed)
Medication has been refilled and will be sent to [facility]

## 2014-12-04 ENCOUNTER — Encounter: Payer: Self-pay | Admitting: Family Medicine

## 2014-12-04 ENCOUNTER — Ambulatory Visit (INDEPENDENT_AMBULATORY_CARE_PROVIDER_SITE_OTHER): Payer: BLUE CROSS/BLUE SHIELD | Admitting: Family Medicine

## 2014-12-04 VITALS — BP 120/76 | HR 86 | Temp 97.4°F | Resp 18 | Ht 65.0 in | Wt 168.9 lb

## 2014-12-04 DIAGNOSIS — F419 Anxiety disorder, unspecified: Secondary | ICD-10-CM | POA: Insufficient documentation

## 2014-12-04 DIAGNOSIS — I10 Essential (primary) hypertension: Secondary | ICD-10-CM | POA: Diagnosis not present

## 2014-12-04 DIAGNOSIS — R079 Chest pain, unspecified: Secondary | ICD-10-CM | POA: Diagnosis not present

## 2014-12-04 MED ORDER — LISINOPRIL 10 MG PO TABS: 10.0000 mg | ORAL_TABLET | Freq: Every day | ORAL | Status: DC

## 2014-12-04 MED ORDER — ALPRAZOLAM 0.5 MG PO TABS: 0.5000 mg | ORAL_TABLET | Freq: Two times a day (BID) | ORAL | Status: DC | PRN

## 2014-12-04 NOTE — Progress Notes (Signed)
Name: Billy Cisneros   MRN: [id number]    DOB: June 15, 1950   Date:12/04/2014       Progress Note  Subjective  Chief Complaint  Chief Complaint  Patient presents with  . Follow-up    3 mo.  . Hypertension  . Anxiety    Hypertension This is a chronic problem. Associated symptoms include anxiety and chest pain (has had some episodes of chest pain over the last 4 weeks.). Pertinent negatives include no headaches, neck pain, orthopnea, palpitations, shortness of breath or sweats. Past treatments include ACE inhibitors. The current treatment provides significant improvement. Hypertensive end-organ damage includes kidney disease and CAD/MI (CAD s/p 5v CABG in 2000.). There is no history of angina or CVA.  Anxiety Presents for follow-up visit. Symptoms include chest pain (has had some episodes of chest pain over the last 4 weeks.), depressed mood, dizziness, irritability, malaise, muscle tension and nervous/anxious behavior. Patient reports no insomnia, palpitations, restlessness or shortness of breath. The severity of symptoms is moderate. The patient sleeps 8 hours per night. The quality of sleep is good.   Past treatments include benzodiazephines. The treatment provided moderate relief. Compliance with prior treatments has been good.      Past Medical History  Diagnosis Date  . Hypertension   . MI (myocardial infarction)   . Anxiety   . GERD (gastroesophageal reflux disease)   . Gout     Past Surgical History  Procedure Laterality Date  . Cardiac surgery      History reviewed. No pertinent family history.  History   Social History  . Marital Status: Married    Spouse Name: N/A  . Number of Children: N/A  . Years of Education: N/A   Occupational History  . Not on file.   Social History Main Topics  . Smoking status: Never Smoker   . Smokeless tobacco: Not on file  . Alcohol Use: 0.0 oz/week    0 Standard drinks or equivalent per week  . Drug Use: 2.00 per week   Special: Cocaine  . Sexual Activity: Yes    Birth Control/ Protection: None   Other Topics Concern  . Not on file   Social History Narrative     Current outpatient prescriptions:  .  acyclovir (ZOVIRAX) 800 MG tablet, Take 800 mg by mouth 2 (two) times daily as needed. For rash, Disp: , Rfl:  .  ALPRAZolam (XANAX) 0.5 MG tablet, , Disp: , Rfl:  .  aspirin EC 81 MG tablet, Take 1 tablet (81 mg total) by mouth daily. For heart attack prevention., Disp: , Rfl:  .  buPROPion (WELLBUTRIN XL) 300 MG 24 hr tablet, Take 1 tablet (300 mg total) by mouth daily., Disp: 90 tablet, Rfl: 0 .  clotrimazole-betamethasone (LOTRISONE) cream, , Disp: , Rfl:  .  lisinopril (PRINIVIL,ZESTRIL) 10 MG tablet, Take 1 tablet (10 mg total) by mouth daily. For elevated blood pressure., Disp: 30 tablet, Rfl: 0 .  omeprazole (PRILOSEC) 20 MG capsule, Take 1 capsule (20 mg total) by mouth 2 (two) times daily before a meal. For reflux., Disp: 180 capsule, Rfl: 0 .  simvastatin (ZOCOR) 40 MG tablet, , Disp: , Rfl:  .  zolpidem (AMBIEN) 10 MG tablet, Take 1 tablet (10 mg total) by mouth at bedtime as needed for sleep., Disp: 30 tablet, Rfl: 0 .  buPROPion (WELLBUTRIN XL) 150 MG 24 hr tablet, Take 1 tablet (150 mg total) by mouth daily. For depression., Disp: 30 tablet, Rfl: 1 .  mirtazapine (  REMERON) 15 MG tablet, Take 1 tablet (15 mg total) by mouth at bedtime as needed (sleep)., Disp: 30 tablet, Rfl: 0 .  traZODone (DESYREL) 50 MG tablet, Take 1 tablet (50 mg total) by mouth at bedtime as needed for sleep., Disp: 30 tablet, Rfl: 0  No Known Allergies   Review of Systems  Constitutional: Positive for irritability.  Respiratory: Negative for cough, hemoptysis, sputum production and shortness of breath.   Cardiovascular: Positive for chest pain (has had some episodes of chest pain over the last 4 weeks.). Negative for palpitations and orthopnea.  Musculoskeletal: Negative for neck pain.  Neurological: Positive for  dizziness. Negative for headaches.  Psychiatric/Behavioral: The patient is nervous/anxious. The patient does not have insomnia.      Objective  Filed Vitals:   12/04/14 1540  BP: 120/76  Pulse: 86  Temp: 97.4 F (36.3 C)  TempSrc: Oral  Resp: 18  Height: 5\' 5"  (1.651 m)  Weight: 168 lb 14.4 oz (76.613 kg)  SpO2: 96%    Physical Exam  Constitutional: He is oriented to person, place, and time and well-developed, well-nourished, and in no distress.  HENT:  Head: Normocephalic and atraumatic.  Cardiovascular: Normal rate and regular rhythm.   Pulmonary/Chest: Effort normal and breath sounds normal.  Neurological: He is alert and oriented to person, place, and time.  Skin: Skin is warm and dry.  Psychiatric: Mood, memory, affect and judgment normal.  Nursing note and vitals reviewed.     Assessment & Plan  1. Essential hypertension Blood Pressure is stable and at goal at current therapy. Refills provided. - lisinopril (PRINIVIL,ZESTRIL) 10 MG tablet; Take 1 tablet (10 mg total) by mouth daily. For elevated blood pressure.  Dispense: 90 tablet; Refill: 0  2. Anxiety Patient has had multiple symptoms of anxiety over the last few weeks as per his wife's account. We will increase alprazolam to twice a day as needed. For now, patient is to continue on Wellbutrin XL 300 mg. Follow-up in 4 weeks. - ALPRAZolam (XANAX) 0.5 MG tablet; Take 1 tablet (0.5 mg total) by mouth 2 (two) times daily as needed for anxiety.  Dispense: 60 tablet; Refill: 0  3. Chest pain, unspecified chest pain type Last stress test was over 3 years ago. Given his frequent episodes of chest pain and history of coronary artery disease, patient will be referred to cardiology for further evaluation. Patient asked to seek immediate medical attention if he has any symptoms suggestive of a heart attack and he verbalized understanding. - Ambulatory referral to Cardiology   [facility] 12/04/2014 4:09 PM

## 2014-12-11 DIAGNOSIS — Z951 Presence of aortocoronary bypass graft: Secondary | ICD-10-CM | POA: Insufficient documentation

## 2014-12-19 ENCOUNTER — Other Ambulatory Visit: Payer: Self-pay | Admitting: Family Medicine

## 2014-12-19 NOTE — Telephone Encounter (Signed)
Medication has been refilled on 12/04/2014 with a 3 month Supply, to soon to refill.

## 2015-01-01 ENCOUNTER — Encounter: Payer: Self-pay | Admitting: Family Medicine

## 2015-01-01 ENCOUNTER — Other Ambulatory Visit: Payer: Self-pay | Admitting: Family Medicine

## 2015-01-01 ENCOUNTER — Ambulatory Visit (INDEPENDENT_AMBULATORY_CARE_PROVIDER_SITE_OTHER): Payer: BLUE CROSS/BLUE SHIELD | Admitting: Family Medicine

## 2015-01-01 ENCOUNTER — Ambulatory Visit
Admission: RE | Admit: 2015-01-01 | Discharge: 2015-01-01 | Disposition: A | Discharge status: Home / Self Care | Payer: BLUE CROSS/BLUE SHIELD | Source: Ambulatory Visit | Attending: Family Medicine | Admitting: Family Medicine

## 2015-01-01 VITALS — BP 120/78 | HR 87 | Temp 97.8°F | Resp 18 | Ht 65.0 in | Wt 168.2 lb

## 2015-01-01 DIAGNOSIS — M542 Cervicalgia: Secondary | ICD-10-CM | POA: Insufficient documentation

## 2015-01-01 DIAGNOSIS — Z0000 Encounter for general adult medical examination without abnormal findings: Secondary | ICD-10-CM

## 2015-01-01 MED ORDER — ZOLPIDEM TARTRATE 10 MG PO TABS: 10.0000 mg | ORAL_TABLET | Freq: Every evening | ORAL | Status: DC | PRN

## 2015-01-01 MED ORDER — SIMVASTATIN 40 MG PO TABS: 40.0000 mg | ORAL_TABLET | Freq: Every day | ORAL | Status: DC

## 2015-01-01 NOTE — Telephone Encounter (Signed)
Medication has been refilled and sent to [facility] 

## 2015-01-01 NOTE — Progress Notes (Signed)
Name: Billy Cisneros   MRN: [id number]    DOB: [birth year]   Date:01/01/2015       Progress Note  Subjective  Chief Complaint  Chief Complaint  Patient presents with  . Annual Exam    CPE  . Hypertension  . Anxiety  . Gout    HPI  Pt. Is here for a CPE. He is doing well, except for a chronic right sided neck pain.  Past Medical History  Diagnosis Date  . Hypertension   . MI (myocardial infarction)   . Anxiety   . GERD (gastroesophageal reflux disease)   . Gout     Past Surgical History  Procedure Laterality Date  . Cardiac surgery      History reviewed. No pertinent family history.  Social History   Social History  . Marital Status: Married    Spouse Name: N/A  . Number of Children: N/A  . Years of Education: N/A   Occupational History  . Not on file.   Social History Main Topics  . Smoking status: Never Smoker   . Smokeless tobacco: Not on file  . Alcohol Use: 0.0 oz/week    0 Standard drinks or equivalent per week  . Drug Use: 2.00 per week    Special: Cocaine  . Sexual Activity: Yes    Birth Control/ Protection: None   Other Topics Concern  . Not on file   Social History Narrative     Current outpatient prescriptions:  .  acyclovir (ZOVIRAX) 800 MG tablet, Take 800 mg by mouth 2 (two) times daily as needed. For rash, Disp: , Rfl:  .  ALPRAZolam (XANAX) 0.5 MG tablet, Take 1 tablet (0.5 mg total) by mouth 2 (two) times daily as needed for anxiety., Disp: 60 tablet, Rfl: 0 .  aspirin EC 81 MG tablet, Take 1 tablet (81 mg total) by mouth daily. For heart attack prevention., Disp: , Rfl:  .  buPROPion (WELLBUTRIN XL) 300 MG 24 hr tablet, Take 1 tablet (300 mg total) by mouth daily., Disp: 90 tablet, Rfl: 0 .  clotrimazole-betamethasone (LOTRISONE) cream, , Disp: , Rfl:  .  lisinopril (PRINIVIL,ZESTRIL) 10 MG tablet, Take 1 tablet (10 mg total) by mouth daily. For elevated blood pressure., Disp: 90 tablet, Rfl: 0 .  omeprazole (PRILOSEC) 20 MG  capsule, Take 1 capsule (20 mg total) by mouth 2 (two) times daily before a meal. For reflux., Disp: 180 capsule, Rfl: 0 .  simvastatin (ZOCOR) 40 MG tablet, , Disp: , Rfl:  .  zolpidem (AMBIEN) 10 MG tablet, Take 10 mg by mouth at bedtime as needed for sleep., Disp: , Rfl:  .  buPROPion (WELLBUTRIN XL) 150 MG 24 hr tablet, Take 1 tablet (150 mg total) by mouth daily. For depression., Disp: 30 tablet, Rfl: 1  No Known Allergies   Review of Systems  Constitutional: Negative for fever, chills, weight loss and malaise/fatigue.  HENT: Negative for congestion, ear pain and sore throat.   Eyes: Negative for blurred vision and double vision.  Respiratory: Negative for cough, sputum production and shortness of breath.   Cardiovascular: Negative for chest pain, palpitations and orthopnea.  Gastrointestinal: Negative for heartburn, nausea, vomiting, abdominal pain, diarrhea and constipation.  Genitourinary: Negative for dysuria, urgency and frequency.  Musculoskeletal: Positive for neck pain. Negative for myalgias and back pain.  Skin: Negative for rash.  Neurological: Negative for dizziness, seizures, loss of consciousness and headaches.  Psychiatric/Behavioral: Positive for depression. The patient is nervous/anxious.  Objective  Filed Vitals:   01/01/15 1143  BP: 120/78  Pulse: 87  Temp: 97.8 F (36.6 C)  TempSrc: Oral  Resp: 18  Height:  (1.651 m)  Weight: 168 lb 3.2 oz (76.295 kg)  SpO2: 95%    Physical Exam  Constitutional: He is oriented to person, place, and time and well-developed, well-nourished, and in no distress.  HENT:  Head: Normocephalic and atraumatic.  Right Ear: External ear normal.  Left Ear: External ear normal.  Nose: Nose normal.  Mouth/Throat: Oropharynx is clear and moist.  Eyes: Conjunctivae are normal. Pupils are equal, round, and reactive to light.  Neck: Normal range of motion. Neck supple.  Cardiovascular: Normal rate and regular rhythm.     Pulmonary/Chest: Effort normal and breath sounds normal.  Abdominal: Soft. Bowel sounds are normal.  Genitourinary: Rectum normal and prostate normal.  Musculoskeletal:       Cervical back: He exhibits tenderness, pain and spasm.       Back:  Neurological: He is alert and oriented to person, place, and time.  Skin: Skin is warm and dry.  Psychiatric: Affect and judgment normal.  Nursing note and vitals reviewed.   Assessment & Plan  1. Annual physical exam  - Lipid Profile - CBC with Differential - Comprehensive Metabolic Panel (CMET) - TSH - Vitamin D (25 hydroxy) - PSA  2. Cervical spine pain  - DG Cervical Spine Complete; Future   Billy Cisneros Asad [facility] 01/01/2015 12:05 PM

## 2015-01-04 ENCOUNTER — Telehealth: Payer: Self-pay | Admitting: Family Medicine

## 2015-01-04 NOTE — Telephone Encounter (Signed)
Billy Cisneros (wife) is requesting return call to discuss xray results.

## 2015-01-05 NOTE — Telephone Encounter (Signed)
Spoke with patient and he has appointment scheduled for Monday 01/08/2015

## 2015-01-08 ENCOUNTER — Encounter: Payer: Self-pay | Admitting: Family Medicine

## 2015-01-08 ENCOUNTER — Ambulatory Visit (INDEPENDENT_AMBULATORY_CARE_PROVIDER_SITE_OTHER): Payer: BLUE CROSS/BLUE SHIELD | Admitting: Family Medicine

## 2015-01-08 VITALS — BP 114/72 | HR 86 | Temp 98.1°F | Resp 16 | Wt 169.7 lb

## 2015-01-08 DIAGNOSIS — F419 Anxiety disorder, unspecified: Secondary | ICD-10-CM

## 2015-01-08 DIAGNOSIS — M5412 Radiculopathy, cervical region: Secondary | ICD-10-CM | POA: Diagnosis not present

## 2015-01-08 DIAGNOSIS — F331 Major depressive disorder, recurrent, moderate: Secondary | ICD-10-CM

## 2015-01-08 MED ORDER — OXYCODONE-ACETAMINOPHEN 5-325 MG PO TABS: 1.0000 | ORAL_TABLET | Freq: Every day | ORAL | Status: DC | PRN

## 2015-01-08 MED ORDER — BUPROPION HCL ER (XL) 300 MG PO TB24: 300.0000 mg | ORAL_TABLET | Freq: Every day | ORAL | Status: DC

## 2015-01-08 MED ORDER — ALPRAZOLAM 0.5 MG PO TABS: 0.5000 mg | ORAL_TABLET | Freq: Two times a day (BID) | ORAL | Status: DC | PRN

## 2015-01-08 NOTE — Progress Notes (Signed)
Name: Billy Cisneros   MRN: [id number]    DOB: [birth year]   Date:01/08/2015       Progress Note  Subjective  Chief Complaint  Chief Complaint  Patient presents with  . Follow-up    1 wk  . Hypertension  . Anxiety    Neck Pain  This is a chronic problem. The current episode started more than 1 year ago. The problem occurs daily. The pain is present in the right side. The quality of the pain is described as burning and shooting. The pain is at a severity of 10/10. Pertinent negatives include no chest pain or tingling. He has tried muscle relaxants and oral narcotics for the symptoms. The treatment provided moderate (Percocet helps with the neck pain) relief.  Anxiety Presents for follow-up visit. Symptoms include insomnia, nervous/anxious behavior and panic. Patient reports no chest pain, excessive worry or shortness of breath.   His past medical history is significant for anxiety/panic attacks and depression. Past treatments include benzodiazephines and SSRIs. The treatment provided significant relief. Compliance with prior treatments has been good.  Depression      The patient presents with depression.  This is a chronic problem.  The onset quality is gradual. The problem is unchanged.  Associated symptoms include insomnia.  Associated symptoms include no helplessness and no hopelessness.  Past treatments include SNRIs - Serotonin and norepinephrine reuptake inhibitors.  Past medical history includes anxiety and depression.      Past Medical History  Diagnosis Date  . Hypertension   . MI (myocardial infarction)   . Anxiety   . GERD (gastroesophageal reflux disease)   . Gout     Past Surgical History  Procedure Laterality Date  . Cardiac surgery      History reviewed. No pertinent family history.  Social History   Social History  . Marital Status: Married    Spouse Name: N/A  . Number of Children: N/A  . Years of Education: N/A   Occupational History  . Not on  file.   Social History Main Topics  . Smoking status: Never Smoker   . Smokeless tobacco: Not on file  . Alcohol Use: 0.0 oz/week    0 Standard drinks or equivalent per week  . Drug Use: 2.00 per week    Special: Cocaine  . Sexual Activity: Yes    Birth Control/ Protection: None   Other Topics Concern  . Not on file   Social History Narrative     Current outpatient prescriptions:  .  ALPRAZolam (XANAX) 0.5 MG tablet, Take 1 tablet (0.5 mg total) by mouth 2 (two) times daily as needed for anxiety., Disp: 60 tablet, Rfl: 0 .  aspirin EC 81 MG tablet, Take 1 tablet (81 mg total) by mouth daily. For heart attack prevention., Disp: , Rfl:  .  buPROPion (WELLBUTRIN XL) 300 MG 24 hr tablet, Take 1 tablet (300 mg total) by mouth daily., Disp: 90 tablet, Rfl: 0 .  clotrimazole-betamethasone (LOTRISONE) cream, , Disp: , Rfl:  .  lisinopril (PRINIVIL,ZESTRIL) 10 MG tablet, Take 1 tablet (10 mg total) by mouth daily. For elevated blood pressure., Disp: 90 tablet, Rfl: 0 .  omeprazole (PRILOSEC) 20 MG capsule, Take 1 capsule (20 mg total) by mouth 2 (two) times daily before a meal. For reflux., Disp: 180 capsule, Rfl: 0 .  oxyCODONE-acetaminophen (ROXICET) 5-325 MG per tablet, Take 1 tablet by mouth daily as needed for severe pain., Disp: 30 tablet, Rfl: 0 .  simvastatin (ZOCOR) 40  MG tablet, Take 1 tablet (40 mg total) by mouth daily., Disp: 30 tablet, Rfl: 3 .  zolpidem (AMBIEN) 10 MG tablet, Take 1 tablet (10 mg total) by mouth at bedtime as needed for sleep., Disp: 30 tablet, Rfl: 0  No Known Allergies   Review of Systems  Respiratory: Negative for shortness of breath.   Cardiovascular: Negative for chest pain.  Musculoskeletal: Positive for neck pain.  Neurological: Negative for tingling.  Psychiatric/Behavioral: Positive for depression. The patient is nervous/anxious and has insomnia.     Objective  Filed Vitals:   01/08/15 0819  BP: 114/72  Pulse: 86  Temp: 98.1 F (36.7 C)    TempSrc: Oral  Resp: 16  Weight: 169 lb 11.2 oz (76.975 kg)  SpO2: 96%    Physical Exam  Constitutional: He is oriented to person, place, and time and well-developed, well-nourished, and in no distress.  Cardiovascular: Normal rate and regular rhythm.   Pulmonary/Chest: Effort normal and breath sounds normal.  Musculoskeletal:       Back:  Spurling test is positive.  Neurological: He is alert and oriented to person, place, and time.  Psychiatric: Memory, affect and judgment normal.  Nursing note and vitals reviewed.  Assessment & Plan  1. Cervical radiculopathy  Patient has taken multiple medications for pain relief in the past, including NSAIDs, opioids, and muscle relaxants. Percocet helps with his pain without causing any obvious adverse effects. I will prescribe Percocet 5 mg to be taken daily as needed for pain relief. He shouldn't educated in detail about the dependence potential and the drug interactions of opioids especially with benzodiazepines. He will be referred to neurosurgery for further evaluation of the chronic neck pain.  - oxyCODONE-acetaminophen (ROXICET) 5-325 MG per tablet; Take 1 tablet by mouth daily as needed for severe pain.  Dispense: 30 tablet; Refill: 0 - Ambulatory referral to Neurosurgery  2. Anxiety  Symptoms of anxiety are stable and controlled on present benzodiazepine therapy. Patient is aware of the dependence potential, side effects, and drug interactions of benzodiazepines especially with opioids. Refills provided and follow-up in one month.  - ALPRAZolam (XANAX) 0.5 MG tablet; Take 1 tablet (0.5 mg total) by mouth 2 (two) times daily as needed for anxiety.  Dispense: 60 tablet; Refill: 0  3. Major depressive disorder, recurrent episode, moderate  Symptoms stable and controlled on present therapy. Refills provided.  - buPROPion (WELLBUTRIN XL) 300 MG 24 hr tablet; Take 1 tablet (300 mg total) by mouth daily.  Dispense: 90 tablet; Refill:  0   Jayleah Garbers Asad [facility] 01/08/2015 6:01 PM

## 2015-01-09 LAB — COMPREHENSIVE METABOLIC PANEL
ALT: 24 IU/L (ref 0–44)
AST: 17 IU/L (ref 0–40)
Albumin/Globulin Ratio: 2.2 (ref 1.1–2.5)
Albumin: 4.4 g/dL (ref 3.6–4.8)
Alkaline Phosphatase: 97 IU/L (ref 39–117)
BUN/Creatinine Ratio: 10 (ref 10–22)
BUN: 16 mg/dL (ref 8–27)
Bilirubin Total: 0.8 mg/dL (ref 0.0–1.2)
CALCIUM: 9.6 mg/dL (ref 8.6–10.2)
CO2: 26 mmol/L (ref 18–29)
Chloride: 101 mmol/L (ref 97–108)
Creatinine, Ser: 1.65 mg/dL — ABNORMAL HIGH (ref 0.76–1.27)
GFR, EST AFRICAN AMERICAN: 51 mL/min/{1.73_m2} — AB (ref >59)
GFR, EST NON AFRICAN AMERICAN: 44 mL/min/{1.73_m2} — AB (ref >59)
GLUCOSE: 103 mg/dL — AB (ref 65–99)
Globulin, Total: 2 g/dL (ref 1.5–4.5)
Potassium: 4.8 mmol/L (ref 3.5–5.2)
Sodium: 142 mmol/L (ref 134–144)
TOTAL PROTEIN: 6.4 g/dL (ref 6.0–8.5)

## 2015-01-09 LAB — PSA: PROSTATE SPECIFIC AG, SERUM: 0.3 ng/mL (ref 0.0–4.0)

## 2015-01-09 LAB — CBC WITH DIFFERENTIAL/PLATELET
BASOS ABS: 0 10*3/uL (ref 0.0–0.2)
BASOS: 0 %
EOS (ABSOLUTE): 0.2 10*3/uL (ref 0.0–0.4)
Eos: 3 %
Hematocrit: 44.2 % (ref 37.5–51.0)
Hemoglobin: 15 g/dL (ref 12.6–17.7)
IMMATURE GRANS (ABS): 0 10*3/uL (ref 0.0–0.1)
IMMATURE GRANULOCYTES: 0 %
LYMPHS: 20 %
Lymphocytes Absolute: 1.2 10*3/uL (ref 0.7–3.1)
MCH: 31.1 pg (ref 26.6–33.0)
MCHC: 33.9 g/dL (ref 31.5–35.7)
MCV: 92 fL (ref 79–97)
Monocytes Absolute: 0.7 10*3/uL (ref 0.1–0.9)
Monocytes: 12 %
NEUTROS PCT: 65 %
Neutrophils Absolute: 3.7 10*3/uL (ref 1.4–7.0)
PLATELETS: 242 10*3/uL (ref 150–379)
RBC: 4.82 x10E6/uL (ref 4.14–5.80)
RDW: 13.4 % (ref 12.3–15.4)
WBC: 5.8 10*3/uL (ref 3.4–10.8)

## 2015-01-09 LAB — LIPID PANEL
CHOL/HDL RATIO: 3.5 ratio (ref 0.0–5.0)
CHOLESTEROL TOTAL: 146 mg/dL (ref 100–199)
HDL: 42 mg/dL (ref >39)
LDL CALC: 76 mg/dL (ref 0–99)
Triglycerides: 139 mg/dL (ref 0–149)
VLDL Cholesterol Cal: 28 mg/dL (ref 5–40)

## 2015-01-09 LAB — TSH: TSH: 2.8 u[IU]/mL (ref 0.450–4.500)

## 2015-01-09 LAB — VITAMIN D 25 HYDROXY (VIT D DEFICIENCY, FRACTURES): Vit D, 25-Hydroxy: 28.8 ng/mL — ABNORMAL LOW (ref 30.0–100.0)

## 2015-02-01 ENCOUNTER — Other Ambulatory Visit: Payer: Self-pay | Admitting: Family Medicine

## 2015-02-01 NOTE — Telephone Encounter (Signed)
Medication has been refill and patient has been notified to pick up prescription at office and to bring a photo ID, patient verbalized understanding

## 2015-02-05 ENCOUNTER — Other Ambulatory Visit: Payer: Self-pay | Admitting: Family Medicine

## 2015-02-08 ENCOUNTER — Ambulatory Visit (INDEPENDENT_AMBULATORY_CARE_PROVIDER_SITE_OTHER): Payer: BLUE CROSS/BLUE SHIELD | Admitting: Family Medicine

## 2015-02-08 ENCOUNTER — Encounter: Payer: Self-pay | Admitting: Family Medicine

## 2015-02-08 VITALS — BP 112/80 | HR 89 | Temp 98.0°F | Resp 18 | Ht 65.0 in | Wt 166.2 lb

## 2015-02-08 DIAGNOSIS — M5412 Radiculopathy, cervical region: Secondary | ICD-10-CM

## 2015-02-08 DIAGNOSIS — F419 Anxiety disorder, unspecified: Secondary | ICD-10-CM | POA: Diagnosis not present

## 2015-02-08 DIAGNOSIS — Z23 Encounter for immunization: Secondary | ICD-10-CM | POA: Diagnosis not present

## 2015-02-08 MED ORDER — OXYCODONE-ACETAMINOPHEN 5-325 MG PO TABS: 1.0000 | ORAL_TABLET | Freq: Every day | ORAL | Status: DC | PRN

## 2015-02-08 MED ORDER — ALPRAZOLAM 0.5 MG PO TABS: 0.5000 mg | ORAL_TABLET | Freq: Two times a day (BID) | ORAL | Status: DC | PRN

## 2015-02-08 NOTE — Progress Notes (Signed)
Name: Billy Cisneros   MRN: [id number]    DOB: 1950-09-26   Date:02/08/2015       Progress Note  Subjective  Chief Complaint  Chief Complaint  Patient presents with  . Follow-up    1 mo  . Hypertension  . Anxiety  . Insomnia  . Medication Refill    xanax 0.5mg     Anxiety Presents for follow-up visit. Symptoms include insomnia and nervous/anxious behavior. Patient reports no excessive worry or irritability.   Past treatments include benzodiazephines. The treatment provided significant relief. Compliance with prior treatments has been good.  Neck Pain  This is a chronic problem. The pain is present in the right side. The quality of the pain is described as cramping and aching. The pain is at a severity of 3/10. The pain is mild. He has tried oral narcotics for the symptoms. The treatment provided significant relief.   Past Medical History  Diagnosis Date  . Hypertension   . MI (myocardial infarction)   . Anxiety   . GERD (gastroesophageal reflux disease)   . Gout     Past Surgical History  Procedure Laterality Date  . Cardiac surgery      History reviewed. No pertinent family history.  Social History   Social History  . Marital Status: Married    Spouse Name: N/A  . Number of Children: N/A  . Years of Education: N/A   Occupational History  . Not on file.   Social History Main Topics  . Smoking status: Never Smoker   . Smokeless tobacco: Not on file  . Alcohol Use: 0.0 oz/week    0 Standard drinks or equivalent per week  . Drug Use: 2.00 per week    Special: Cocaine  . Sexual Activity: Yes    Birth Control/ Protection: None   Other Topics Concern  . Not on file   Social History Narrative     Current outpatient prescriptions:  .  ALPRAZolam (XANAX) 0.5 MG tablet, Take 1 tablet (0.5 mg total) by mouth 2 (two) times daily as needed for anxiety., Disp: 60 tablet, Rfl: 0 .  aspirin EC 81 MG tablet, Take 1 tablet (81 mg total) by mouth daily. For heart  attack prevention., Disp: , Rfl:  .  buPROPion (WELLBUTRIN XL) 300 MG 24 hr tablet, Take 1 tablet (300 mg total) by mouth daily., Disp: 90 tablet, Rfl: 0 .  clotrimazole-betamethasone (LOTRISONE) cream, , Disp: , Rfl:  .  lisinopril (PRINIVIL,ZESTRIL) 10 MG tablet, Take 1 tablet (10 mg total) by mouth daily. For elevated blood pressure., Disp: 90 tablet, Rfl: 0 .  omeprazole (PRILOSEC) 20 MG capsule, Take 1 capsule (20 mg total) by mouth 2 (two) times daily before a meal. For reflux., Disp: 180 capsule, Rfl: 0 .  oxyCODONE-acetaminophen (ROXICET) 5-325 MG per tablet, Take 1 tablet by mouth daily as needed for severe pain., Disp: 30 tablet, Rfl: 0 .  simvastatin (ZOCOR) 40 MG tablet, Take 1 tablet (40 mg total) by mouth daily., Disp: 30 tablet, Rfl: 3 .  zolpidem (AMBIEN) 10 MG tablet, Take 1 tablet (10 mg total) by mouth at bedtime., Disp: 30 tablet, Rfl: 0  No Known Allergies   Review of Systems  Constitutional: Negative for irritability.  Musculoskeletal: Positive for neck pain.  Psychiatric/Behavioral: Positive for depression. The patient is nervous/anxious and has insomnia.    Objective  Filed Vitals:   02/08/15 0906  BP: 112/80  Pulse: 89  Temp: 98 F (36.7 C)  TempSrc: Oral  Resp: 18  Height:  (1.651 m)  Weight: 166 lb 3.2 oz (75.388 kg)  SpO2: 97%    Physical Exam  Constitutional: He is oriented to person, place, and time and well-developed, well-nourished, and in no distress.  Cardiovascular: Normal rate and regular rhythm.   Pulmonary/Chest: Effort normal and breath sounds normal.  Musculoskeletal:       Cervical back: He exhibits pain. He exhibits no tenderness and no swelling.  Subjective sensation of numbness over the midline cervical spine.  Neurological: He is alert and oriented to person, place, and time.  Psychiatric: Affect and judgment normal.  Nursing note and vitals reviewed.  Assessment & Plan  1. Need for immunization against influenza  - Flu  Vaccine QUAD 36+ mos PF IM (Fluarix & Fluzone Quad PF)  2. Cervical radiculopathy Pain is much better controlled with initiation of opioid therapy. No side effects reported. Continue present management. - oxyCODONE-acetaminophen (ROXICET) 5-325 MG tablet; Take 1 tablet by mouth daily as needed for severe pain.  Dispense: 30 tablet; Refill: 0  3. Anxiety Symptoms of anxiety are stable on present benzodiazepine therapy. Patient is aware of the dependence potential and the drug interactions between  Benzodiazepines and opioids. No side effects reported. Refills provided and follow-up in one month. - ALPRAZolam (XANAX) 0.5 MG tablet; Take 1 tablet (0.5 mg total) by mouth 2 (two) times daily as needed for anxiety.  Dispense: 60 tablet; Refill: 0   Richardson Dubree Asad [facility] 02/08/2015 9:12 AM

## 2015-03-06 ENCOUNTER — Other Ambulatory Visit: Payer: Self-pay | Admitting: Family Medicine

## 2015-03-06 DIAGNOSIS — G4700 Insomnia, unspecified: Secondary | ICD-10-CM

## 2015-03-08 ENCOUNTER — Other Ambulatory Visit: Payer: Self-pay | Admitting: Family Medicine

## 2015-03-12 ENCOUNTER — Encounter: Payer: Self-pay | Admitting: Family Medicine

## 2015-03-12 ENCOUNTER — Ambulatory Visit (INDEPENDENT_AMBULATORY_CARE_PROVIDER_SITE_OTHER): Payer: BLUE CROSS/BLUE SHIELD | Admitting: Family Medicine

## 2015-03-12 VITALS — BP 118/70 | HR 107 | Temp 98.7°F | Resp 16 | Ht 65.0 in | Wt 167.0 lb

## 2015-03-12 DIAGNOSIS — E785 Hyperlipidemia, unspecified: Secondary | ICD-10-CM | POA: Insufficient documentation

## 2015-03-12 DIAGNOSIS — F419 Anxiety disorder, unspecified: Secondary | ICD-10-CM | POA: Diagnosis not present

## 2015-03-12 DIAGNOSIS — M5412 Radiculopathy, cervical region: Secondary | ICD-10-CM

## 2015-03-12 DIAGNOSIS — I10 Essential (primary) hypertension: Secondary | ICD-10-CM | POA: Diagnosis not present

## 2015-03-12 MED ORDER — LISINOPRIL 10 MG PO TABS: 10.0000 mg | ORAL_TABLET | Freq: Every day | ORAL | Status: DC

## 2015-03-12 MED ORDER — OXYCODONE-ACETAMINOPHEN 5-325 MG PO TABS: 1.0000 | ORAL_TABLET | Freq: Every day | ORAL | Status: DC | PRN

## 2015-03-12 MED ORDER — SIMVASTATIN 40 MG PO TABS: 40.0000 mg | ORAL_TABLET | Freq: Every day | ORAL | Status: DC

## 2015-03-12 MED ORDER — ALPRAZOLAM 0.5 MG PO TABS: 0.5000 mg | ORAL_TABLET | Freq: Two times a day (BID) | ORAL | Status: DC | PRN

## 2015-03-12 NOTE — Progress Notes (Signed)
Name: Billy Cisneros   MRN: [id number]    DOB: 1951-01-06   Date:03/12/2015       Progress Note  Subjective  Chief Complaint  Chief Complaint  Patient presents with  . Hypertension  . Anxiety    1 month follow up  . Shoulder Pain    Anxiety Presents for follow-up visit. Symptoms include depressed mood, irritability, muscle tension, nervous/anxious behavior and shortness of breath. Patient reports no chest pain (chest tightness). Symptoms occur most days. The severity of symptoms is moderate.   His past medical history is significant for depression. Past treatments include benzodiazephines.  Neck Pain  This is a chronic problem. 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 The pain is at a severity of 6/10. Pertinent negatives include no chest pain (chest tightness), fever, headaches, numbness, visual change or weakness. He has tried oral narcotics and heat for the symptoms.  Hypertension This is a chronic problem. Associated symptoms include anxiety, neck pain and shortness of breath. Pertinent negatives include no chest pain (chest tightness) or headaches. Past treatments include ACE inhibitors. There is no history of kidney disease, CAD/MI or CVA.    Past Medical History  Diagnosis Date  . Hypertension   . MI (myocardial infarction) (HCC)   . Anxiety   . GERD (gastroesophageal reflux disease)   . Gout     Past Surgical History  Procedure Laterality Date  . Cardiac surgery      History reviewed. No pertinent family history.  Social History   Social History  . Marital Status: Married    Spouse Name: N/A  . Number of Children: N/A  . Years of Education: N/A   Occupational History  . Not on file.   Social History Main Topics  . Smoking status: Never Smoker   . Smokeless tobacco: Not on file  . Alcohol Use: 0.0 oz/week    0 Standard drinks or equivalent per week  . Drug Use: 2.00 per week   Special: Cocaine  . Sexual Activity: Yes    Birth Control/ Protection: None   Other Topics Concern  . Not on file   Social History Narrative    Current outpatient prescriptions:  .  ALPRAZolam (XANAX) 0.5 MG tablet, TAKE ONE TABLET BY MOUTH TWICE DAILY AS NEEDED FOR ANXIETY, Disp: 60 tablet, Rfl: 0 .  aspirin EC 81 MG tablet, Take 1 tablet (81 mg total) by mouth daily. For heart attack prevention., Disp: , Rfl:  .  buPROPion (WELLBUTRIN XL) 300 MG 24 hr tablet, Take 1 tablet (300 mg total) by mouth daily., Disp: 90 tablet, Rfl: 0 .  clotrimazole-betamethasone (LOTRISONE) cream, , Disp: , Rfl:  .  lisinopril (PRINIVIL,ZESTRIL) 10 MG tablet, Take 1 tablet (10 mg total) by mouth daily. For elevated blood pressure., Disp: 90 tablet, Rfl: 0 .  omeprazole (PRILOSEC) 20 MG capsule, Take 1 capsule (20 mg total) by mouth 2 (two) times daily before a meal. For reflux., Disp: 180 capsule, Rfl: 0 .  oxyCODONE-acetaminophen (ROXICET) 5-325 MG tablet, Take 1 tablet by mouth daily as needed for severe pain., Disp: 30 tablet, Rfl: 0 .  simvastatin (ZOCOR) 40 MG tablet, Take 1 tablet (40 mg total) by mouth daily., Disp: 30 tablet, Rfl: 3 .  zolpidem (AMBIEN) 10 MG tablet, Take 1 tablet (10 mg total) by mouth at bedtime., Disp: 30 tablet, Rfl: 2  No Known Allergies   Review of Systems  Constitutional: Positive  for irritability. Negative for fever.  Respiratory: Positive for shortness of breath.   Cardiovascular: Negative for chest pain (chest tightness).  Musculoskeletal: Positive for neck pain.  Neurological: Negative for weakness, numbness and headaches.  Psychiatric/Behavioral: The patient is nervous/anxious.     Objective  Filed Vitals:   03/12/15 0826  BP: 118/70  Pulse: 107  Temp: 98.7 F (37.1 C)  TempSrc: Oral  Resp: 16  Height: 5\' 5"  (1.651 m)  Weight: 167 lb (75.751 kg)  SpO2: 94%    Physical Exam  Constitutional: He is oriented to person, place, and time and well-developed,  well-nourished, and in no distress.  Cardiovascular: Normal rate, regular rhythm and normal heart sounds.   Pulmonary/Chest: Effort normal and breath sounds normal.  Musculoskeletal:       Cervical back: He exhibits tenderness, pain and spasm.       Back:  Neurological: He is alert and oriented to person, place, and time.  Nursing note and vitals reviewed.    Assessment & Plan  1. 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 May take Percocet daily as needed. Patient compliant with controlled substances agreement. - oxyCODONE-acetaminophen (ROXICET) 5-325 MG tablet; Take 1 tablet by mouth daily as needed for severe pain.  Dispense: 30 tablet; Refill: 0  2. Anxiety Taking alprazolam for symptoms of anxiety and irritability. Controlled on present therapy. Aware of the dependence potential and side effects of benzodiazepines. Refills provided. - ALPRAZolam (XANAX) 0.5 MG tablet; Take 1 tablet (0.5 mg total) by mouth 2 (two) times daily as needed. for anxiety  Dispense: 60 tablet; Refill: 0  3. Essential hypertension  - lisinopril (PRINIVIL,ZESTRIL) 10 MG tablet; Take 1 tablet (10 mg total) by mouth daily. For elevated blood pressure.  Dispense: 90 tablet; Refill: 0  4. Dyslipidemia  - simvastatin (ZOCOR) 40 MG tablet; Take 1 tablet (40 mg total) by mouth daily.  Dispense: 90 tablet; Refill: 0   Kiela Shisler Asad A. Faylene KurtzShah [facility] 03/12/2015 8:42 AM

## 2015-04-11 ENCOUNTER — Other Ambulatory Visit: Payer: Self-pay | Admitting: Family Medicine

## 2015-04-11 DIAGNOSIS — M5412 Radiculopathy, cervical region: Secondary | ICD-10-CM

## 2015-04-11 DIAGNOSIS — F419 Anxiety disorder, unspecified: Secondary | ICD-10-CM

## 2015-04-11 MED ORDER — ALPRAZOLAM 0.5 MG PO TABS: 0.5000 mg | ORAL_TABLET | Freq: Two times a day (BID) | ORAL | Status: DC | PRN

## 2015-04-11 MED ORDER — OXYCODONE-ACETAMINOPHEN 5-325 MG PO TABS: 1.0000 | ORAL_TABLET | Freq: Every day | ORAL | Status: DC | PRN

## 2015-04-11 NOTE — Telephone Encounter (Signed)
Routed to Dr. Shah for approval 

## 2015-04-11 NOTE — Telephone Encounter (Signed)
Pt needs refill on Alprazolam and Percocet.

## 2015-05-15 ENCOUNTER — Other Ambulatory Visit: Payer: Self-pay | Admitting: Family Medicine

## 2015-05-15 DIAGNOSIS — M5412 Radiculopathy, cervical region: Secondary | ICD-10-CM

## 2015-05-15 DIAGNOSIS — F419 Anxiety disorder, unspecified: Secondary | ICD-10-CM

## 2015-05-15 NOTE — Telephone Encounter (Signed)
Pt needs refill on Alprazolam and percocet

## 2015-05-17 NOTE — Telephone Encounter (Signed)
Routed to Dr. Shah for approval 

## 2015-05-18 MED ORDER — OXYCODONE-ACETAMINOPHEN 5-325 MG PO TABS: 1.0000 | ORAL_TABLET | Freq: Every day | ORAL | Status: DC | PRN

## 2015-05-18 MED ORDER — ALPRAZOLAM 0.5 MG PO TABS: 0.5000 mg | ORAL_TABLET | Freq: Two times a day (BID) | ORAL | Status: DC | PRN

## 2015-06-12 ENCOUNTER — Ambulatory Visit (INDEPENDENT_AMBULATORY_CARE_PROVIDER_SITE_OTHER): Payer: BLUE CROSS/BLUE SHIELD | Admitting: Family Medicine

## 2015-06-12 ENCOUNTER — Encounter: Payer: Self-pay | Admitting: Family Medicine

## 2015-06-12 VITALS — BP 124/86 | HR 95 | Temp 98.0°F | Resp 16 | Ht 65.0 in | Wt 177.2 lb

## 2015-06-12 DIAGNOSIS — G4700 Insomnia, unspecified: Secondary | ICD-10-CM | POA: Insufficient documentation

## 2015-06-12 DIAGNOSIS — M25511 Pain in right shoulder: Secondary | ICD-10-CM

## 2015-06-12 DIAGNOSIS — G8929 Other chronic pain: Secondary | ICD-10-CM | POA: Insufficient documentation

## 2015-06-12 DIAGNOSIS — F419 Anxiety disorder, unspecified: Secondary | ICD-10-CM

## 2015-06-12 MED ORDER — OXYCODONE-ACETAMINOPHEN 5-325 MG PO TABS: 1.0000 | ORAL_TABLET | Freq: Every day | ORAL | Status: DC | PRN

## 2015-06-12 MED ORDER — ZOLPIDEM TARTRATE 10 MG PO TABS: 10.0000 mg | ORAL_TABLET | Freq: Every day | ORAL | Status: DC

## 2015-06-12 MED ORDER — ALPRAZOLAM 0.5 MG PO TABS: 0.5000 mg | ORAL_TABLET | Freq: Two times a day (BID) | ORAL | Status: DC | PRN

## 2015-06-12 NOTE — Progress Notes (Signed)
Name: Billy Cisneros   MRN: [id number]    DOB: 06-Aug-1950   Date:06/12/2015       Progress Note  Subjective  Chief Complaint  Chief Complaint  Patient presents with  . Medication Refill    3 month folllow-up  . Hypertension  . Depression  . Hyperlipidemia  . Insomnia  . Pain    shoulder and foot  . Gastroesophageal Reflux    Insomnia Primary symptoms: difficulty falling asleep.  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 How long after going to bed to you fall asleep: less than 15 minutes (with medication, he falls asleep quickly.).   PMH includes: depression.  Anxiety Presents for follow-up visit. The problem has been unchanged. Symptoms include depressed mood, excessive worry, insomnia, malaise and nervous/anxious behavior.   His past medical history is significant for anxiety/panic attacks. Past treatments include benzodiazephines.  Neck Pain  This is a chronic problem. The pain is present in the right side (right side of neck and right shoulder). The quality of the pain is described as shooting. The pain is at a severity of 3/10. 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 Pertinent negatives include no fever or weight loss. He has tried oral narcotics for the symptoms.     Past Medical History  Diagnosis Date  . Hypertension   . MI (myocardial infarction) (HCC)   . Anxiety   . GERD (gastroesophageal reflux disease)   . Gout     Past Surgical History  Procedure Laterality Date  . Cardiac surgery      No family history on file.  Social History   Social History  . Marital Status: Married    Spouse Name: N/A  . Number of Children: N/A  . Years of Education: N/A   Occupational History  . Not on file.   Social History Main Topics  . Smoking status: Never Smoker   . Smokeless tobacco: Not on file  . Alcohol Use: 0.0 oz/week    0 Standard drinks or  equivalent per week  . Drug Use: 2.00 per week    Special: Cocaine  . Sexual Activity: Yes    Birth Control/ Protection: None   Other Topics Concern  . Not on file   Social History Narrative     Current outpatient prescriptions:  .  ALPRAZolam (XANAX) 0.5 MG tablet, Take 1 tablet (0.5 mg total) by mouth 2 (two) times daily as needed. for anxiety, Disp: 60 tablet, Rfl: 0 .  aspirin EC 81 MG tablet, Take 1 tablet (81 mg total) by mouth daily. For heart attack prevention., Disp: , Rfl:  .  buPROPion (WELLBUTRIN XL) 300 MG 24 hr tablet, Take 1 tablet (300 mg total) by mouth daily., Disp: 90 tablet, Rfl: 0 .  clotrimazole-betamethasone (LOTRISONE) cream, , Disp: , Rfl:  .  lisinopril (PRINIVIL,ZESTRIL) 10 MG tablet, Take 1 tablet (10 mg total) by mouth daily. For elevated blood pressure., Disp: 90 tablet, Rfl: 0 .  omeprazole (PRILOSEC) 20 MG capsule, Take 1 capsule (20 mg total) by mouth 2 (two) times daily before a meal. For reflux., Disp: 180 capsule, Rfl: 0 .  oxyCODONE-acetaminophen (ROXICET) 5-325 MG tablet, Take 1 tablet by mouth daily as needed for severe pain., Disp: 30 tablet, Rfl: 0 .  simvastatin (ZOCOR) 40 MG tablet, Take 1 tablet (40 mg total) by mouth daily., Disp: 90 tablet,  Rfl: 0 .  zolpidem (AMBIEN) 10 MG tablet, Take 1 tablet (10 mg total) by mouth at bedtime., Disp: 30 tablet, Rfl: 2  No Known Allergies   Review of Systems  Constitutional: Negative for fever, chills and weight loss.  Musculoskeletal: Positive for joint pain and neck pain.  Psychiatric/Behavioral: Positive for depression. The patient is nervous/anxious and has insomnia.      Objective  Filed Vitals:   06/12/15 0828  BP: 124/86  Pulse: 95  Temp: 98 F (36.7 C)  TempSrc: Oral  Resp: 16  Height:  (1.651 m)  Weight: 177 lb 3.2 oz (80.377 kg)  SpO2: 96%    Physical Exam  Constitutional: He is oriented to person, place, and time and well-developed, well-nourished, and in no distress.    HENT:  Head: Normocephalic and atraumatic.  Cardiovascular: Normal rate and regular rhythm.   Pulmonary/Chest: Effort normal and breath sounds normal.  Musculoskeletal:       Right shoulder: He exhibits tenderness and pain. He exhibits no swelling and no effusion.       Arms: Neurological: He is alert and oriented to person, place, and time.  Psychiatric: Mood, memory, affect and judgment normal.  Nursing note and vitals reviewed.     Assessment & Plan  1. Anxiety Stable on alprazolam taken twice daily as needed. Refills provided. - ALPRAZolam (XANAX) 0.5 MG tablet; Take 1 tablet (0.5 mg total) by mouth 2 (two) times daily as needed. for anxiety  Dispense: 60 tablet; Refill: 0  2. Chronic right shoulder pain Chronic persistent right shoulder pain, worse with activity, associated with driving a truck. Stable on appropriate therapy. We'll order x-rays for evaluation. - oxyCODONE-acetaminophen (ROXICET) 5-325 MG tablet; Take 1 tablet by mouth daily as needed for severe pain.  Dispense: 30 tablet; Refill: 0 - DG Shoulder Right; Future  3. Cannot sleep  - zolpidem (AMBIEN) 10 MG tablet; Take 1 tablet (10 mg total) by mouth at bedtime.  Dispense: 30 tablet; Refill: 2   [name] Asad A. Faylene Kurtz [facility] 06/12/2015 8:34 AM .  .

## 2015-06-15 ENCOUNTER — Other Ambulatory Visit: Payer: Self-pay

## 2015-06-15 ENCOUNTER — Other Ambulatory Visit: Payer: Self-pay | Admitting: Family Medicine

## 2015-06-15 DIAGNOSIS — F331 Major depressive disorder, recurrent, moderate: Secondary | ICD-10-CM

## 2015-06-15 DIAGNOSIS — E785 Hyperlipidemia, unspecified: Secondary | ICD-10-CM

## 2015-06-15 DIAGNOSIS — I10 Essential (primary) hypertension: Secondary | ICD-10-CM

## 2015-06-15 MED ORDER — BUPROPION HCL ER (XL) 300 MG PO TB24: 300.0000 mg | ORAL_TABLET | Freq: Every day | ORAL | Status: DC

## 2015-06-15 MED ORDER — OMEPRAZOLE 20 MG PO CPDR: 20.0000 mg | DELAYED_RELEASE_CAPSULE | Freq: Two times a day (BID) | ORAL | Status: DC

## 2015-06-15 MED ORDER — SIMVASTATIN 40 MG PO TABS: 40.0000 mg | ORAL_TABLET | Freq: Every day | ORAL | Status: DC

## 2015-06-15 MED ORDER — LISINOPRIL 10 MG PO TABS: 10.0000 mg | ORAL_TABLET | Freq: Every day | ORAL | Status: DC

## 2015-07-10 ENCOUNTER — Ambulatory Visit: Payer: Self-pay | Admitting: Family Medicine

## 2015-07-10 ENCOUNTER — Telehealth: Payer: Self-pay | Admitting: Internal Medicine

## 2015-07-10 NOTE — Telephone Encounter (Signed)
S/w pt who reports he did not call us today regarding a remote check. States there are two Kandy G[place] in [place]. Thanked patient and will forward back to front office.

## 2015-07-10 NOTE — Telephone Encounter (Signed)
  Pt would like a call back regarding his remote check performed 3 weeks ago. Please advise. Pt is having a prostate surgery, and is concerned if he needs any restrictions before the surgery(next Tues 3/7). Please call and advise.        

## 2015-07-17 ENCOUNTER — Other Ambulatory Visit: Payer: Self-pay | Admitting: Family Medicine

## 2015-07-17 DIAGNOSIS — M25511 Pain in right shoulder: Secondary | ICD-10-CM

## 2015-07-17 DIAGNOSIS — F419 Anxiety disorder, unspecified: Secondary | ICD-10-CM

## 2015-07-17 DIAGNOSIS — G8929 Other chronic pain: Secondary | ICD-10-CM

## 2015-07-17 NOTE — Telephone Encounter (Signed)
Patient is requesting a refill on the following:  OxyCodone Xanax  Patient is already scheduled for come in on 07/23/15.  His last refill was on 06/15/15.  Please call patient back once complete @ [phone].

## 2015-07-18 MED ORDER — OXYCODONE-ACETAMINOPHEN 5-325 MG PO TABS: 1.0000 | ORAL_TABLET | Freq: Every day | ORAL | Status: DC | PRN

## 2015-07-18 MED ORDER — ALPRAZOLAM 0.5 MG PO TABS: 0.5000 mg | ORAL_TABLET | Freq: Two times a day (BID) | ORAL | Status: DC | PRN

## 2015-07-18 NOTE — Telephone Encounter (Signed)
Routed to Dr. Shah for refill approval 

## 2015-07-23 ENCOUNTER — Ambulatory Visit: Payer: Self-pay | Admitting: Family Medicine

## 2015-08-13 ENCOUNTER — Encounter: Payer: Self-pay | Admitting: Family Medicine

## 2015-08-13 ENCOUNTER — Ambulatory Visit (INDEPENDENT_AMBULATORY_CARE_PROVIDER_SITE_OTHER): Payer: Self-pay | Admitting: Family Medicine

## 2015-08-13 ENCOUNTER — Ambulatory Visit
Admission: RE | Admit: 2015-08-13 | Discharge: 2015-08-13 | Disposition: A | Discharge status: Home / Self Care | Payer: Self-pay | Source: Ambulatory Visit | Attending: Family Medicine | Admitting: Family Medicine

## 2015-08-13 VITALS — BP 121/64 | HR 99 | Temp 98.4°F | Resp 18 | Ht 65.0 in | Wt 166.5 lb

## 2015-08-13 DIAGNOSIS — M25531 Pain in right wrist: Secondary | ICD-10-CM

## 2015-08-13 DIAGNOSIS — M25431 Effusion, right wrist: Secondary | ICD-10-CM | POA: Insufficient documentation

## 2015-08-13 DIAGNOSIS — M25511 Pain in right shoulder: Secondary | ICD-10-CM | POA: Insufficient documentation

## 2015-08-13 DIAGNOSIS — F419 Anxiety disorder, unspecified: Secondary | ICD-10-CM

## 2015-08-13 DIAGNOSIS — M19011 Primary osteoarthritis, right shoulder: Secondary | ICD-10-CM | POA: Insufficient documentation

## 2015-08-13 DIAGNOSIS — M19041 Primary osteoarthritis, right hand: Secondary | ICD-10-CM | POA: Insufficient documentation

## 2015-08-13 DIAGNOSIS — R05 Cough: Secondary | ICD-10-CM

## 2015-08-13 DIAGNOSIS — G8929 Other chronic pain: Secondary | ICD-10-CM

## 2015-08-13 DIAGNOSIS — R058 Other specified cough: Secondary | ICD-10-CM | POA: Insufficient documentation

## 2015-08-13 DIAGNOSIS — M19031 Primary osteoarthritis, right wrist: Secondary | ICD-10-CM | POA: Insufficient documentation

## 2015-08-13 MED ORDER — BENZONATATE 200 MG PO CAPS: 200.0000 mg | ORAL_CAPSULE | Freq: Three times a day (TID) | ORAL | Status: DC | PRN

## 2015-08-13 MED ORDER — OXYCODONE-ACETAMINOPHEN 5-325 MG PO TABS: 1.0000 | ORAL_TABLET | Freq: Every day | ORAL | Status: DC | PRN

## 2015-08-13 MED ORDER — ALPRAZOLAM 0.5 MG PO TABS: 0.5000 mg | ORAL_TABLET | Freq: Two times a day (BID) | ORAL | Status: DC | PRN

## 2015-08-13 NOTE — Progress Notes (Signed)
Name: Billy Cisneros   MRN: [id number]    DOB: January 24, 1951   Date:08/14/2015       Progress Note  Subjective  Chief Complaint  Chief Complaint  Patient presents with  . Follow-up    1 mo  . Medication Refill    xanax 0.5 mg / oxycodone     Anxiety Presents for follow-up visit. The problem has been unchanged. Symptoms include depressed mood, excessive worry, malaise and nervous/anxious behavior. The severity of symptoms is moderate.   His past medical history is significant for anxiety/panic attacks. Past treatments include benzodiazephines. Compliance with prior treatments has been good.  Neck Pain  This is a chronic problem. The pain is present in the right side (right side of neck and right shoulder). The quality of the pain is described as shooting. The pain is at a severity of 5/10. 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 Pertinent negatives include no fever or weight loss. He has tried oral narcotics for the symptoms.    Right Hand and Wrist Swelling: Pt. Presents for recurrent right hand and wrist swelling and pain. This happened 3 times in the last month, rapid swelling, accompanied by pain, worse with activity (swelling and pain made him unable to perform his home and work activities). Swelling has since improved to some extent, but still painful and still wearing his brace when he picks up with right hand.  Productive Cough: Present for over a week, started with fevers and chills and some sore throat. Coughing has not improved while fevers, chills, and sore throat has resolved. He has not taken any medication except for OTC cough drops..  Past Medical History  Diagnosis Date  . Hypertension   . MI (myocardial infarction) ([place])   . Anxiety   . GERD (gastroesophageal reflux disease)   . Gout     Past Surgical History  Procedure Laterality Date  . Cardiac surgery      History reviewed. No pertinent family history.  Social History   Social History    . Marital Status: Married    Spouse Name: N/A  . Number of Children: N/A  . Years of Education: N/A   Occupational History  . Not on file.   Social History Main Topics  . Smoking status: Never Smoker   . Smokeless tobacco: Not on file  . Alcohol Use: 0.0 oz/week    0 Standard drinks or equivalent per week  . Drug Use: 2.00 per week    Special: Cocaine  . Sexual Activity: Yes    Birth Control/ Protection: None   Other Topics Concern  . Not on file   Social History Narrative     Current outpatient prescriptions:  .  ALPRAZolam (XANAX) 0.5 MG tablet, Take 1 tablet (0.5 mg total) by mouth 2 (two) times daily as needed. for anxiety, Disp: 60 tablet, Rfl: 0 .  aspirin EC 81 MG tablet, Take 1 tablet (81 mg total) by mouth daily. For heart attack prevention., Disp: , Rfl:  .  buPROPion (WELLBUTRIN XL) 300 MG 24 hr tablet, Take 1 tablet (300 mg total) by mouth daily., Disp: 90 tablet, Rfl: 0 .  clotrimazole-betamethasone (LOTRISONE) cream, , Disp: , Rfl:  .  lisinopril (PRINIVIL,ZESTRIL) 10 MG tablet, Take 1 tablet (10 mg total) by mouth daily. For elevated blood pressure., Disp: 90 tablet, Rfl: 0 .  omeprazole (PRILOSEC) 20 MG capsule, Take 1 capsule (20 mg total) by mouth 2 (two) times daily before a meal. For  reflux., Disp: 180 capsule, Rfl: 0 .  oxyCODONE-acetaminophen (ROXICET) 5-325 MG tablet, Take 1 tablet by mouth daily as needed for severe pain., Disp: 30 tablet, Rfl: 0 .  simvastatin (ZOCOR) 40 MG tablet, Take 1 tablet (40 mg total) by mouth daily., Disp: 90 tablet, Rfl: 0 .  zolpidem (AMBIEN) 10 MG tablet, Take 1 tablet (10 mg total) by mouth at bedtime., Disp: 30 tablet, Rfl: 2 .  benzonatate (TESSALON) 200 MG capsule, Take 1 capsule (200 mg total) by mouth 3 (three) times daily as needed for cough., Disp: 20 capsule, Rfl: 0  No Known Allergies   Review of Systems  Constitutional: Negative for fever and weight loss.  Musculoskeletal: Positive for joint pain and neck  pain.  Psychiatric/Behavioral: The patient is nervous/anxious.      Objective  Filed Vitals:   08/13/15 1457  BP: 121/64  Pulse: 99  Temp: 98.4 F (36.9 C)  TempSrc: Oral  Resp: 18  Height: _0  (1.651 m)  Weight: 166 lb 8 oz (75.524 kg)  SpO2: 96%    Physical Exam  Constitutional: He is oriented to person, place, and time and well-developed, well-nourished, and in no distress.  HENT:  Head: Normocephalic and atraumatic.  Cardiovascular: Normal rate and regular rhythm.   Pulmonary/Chest: Effort normal and breath sounds normal.  Musculoskeletal:       Right hand: He exhibits decreased range of motion, tenderness and swelling.       Hands: Tenderness to palpation over the medial and lateral sides of right wrist, erythematous, edematous, ROM somewhat restricted at the wrist.  Neurological: He is alert and oriented to person, place, and time.  Psychiatric: Mood, memory, affect and judgment normal.  Nursing note and vitals reviewed.    Assessment & Plan  1. Anxiety Refill for alprazolam provided. - ALPRAZolam (XANAX) 0.5 MG tablet; Take 1 tablet (0.5 mg total) by mouth 2 (two) times daily as needed. for anxiety  Dispense: 60 tablet; Refill: 0  2. Chronic right shoulder pain Right shoulder and neck pain, responsive to opioid therapy. We'll obtain urine drug screen today. - oxyCODONE-acetaminophen (ROXICET) 5-325 MG tablet; Take 1 tablet by mouth daily as needed for severe pain.  Dispense: 30 tablet; Refill: 0 - Drug Screen 12+Alcohol+CRT, Ur  3. Pain and swelling of right wrist We'll rule out infectious, versus arthritic versus gout as probable etiologies for pain and swelling of right wrist. - DG Hand Complete Right; Future - DG Wrist Complete Right; Future - CBC with Differential - Uric acid - Sed Rate (ESR) - Comprehensive Metabolic Panel (CMET)  4. Productive cough  - benzonatate (TESSALON) 200 MG capsule; Take 1 capsule (200 mg total) by mouth 3 (three) times  daily as needed for cough.  Dispense: 20 capsule; Refill: 0     Daimen Shovlin Asad A. [facility] 08/14/2015 5:01 PM

## 2015-09-09 ENCOUNTER — Other Ambulatory Visit: Payer: Self-pay | Admitting: Family Medicine

## 2015-09-10 ENCOUNTER — Ambulatory Visit: Payer: Self-pay | Admitting: Family Medicine

## 2015-09-11 ENCOUNTER — Other Ambulatory Visit: Payer: Self-pay

## 2015-09-11 ENCOUNTER — Other Ambulatory Visit: Payer: Self-pay | Admitting: Family Medicine

## 2015-09-11 DIAGNOSIS — F419 Anxiety disorder, unspecified: Secondary | ICD-10-CM

## 2015-09-11 NOTE — Telephone Encounter (Signed)
Billy Cisneros requested that pt be seen first. Pt cancelled appt on 09/10/15 and has appt on 09/17/15. Billy Cisneros will refill then.

## 2015-09-11 NOTE — Telephone Encounter (Signed)
Needs refill for zolpidem

## 2015-09-12 ENCOUNTER — Telehealth: Payer: Self-pay | Admitting: Family Medicine

## 2015-09-12 LAB — DRUG SCREEN 12+ALCOHOL+CRT, UR
AMPHETAMINES, URINE: NEGATIVE ng/mL
BARBITURATE: NEGATIVE ng/mL
BENZODIAZ UR QL: NEGATIVE ng/mL
CANNABINOIDS: NEGATIVE ng/mL
CREATININE, RANDOM U: 41.9 mg/dL (ref 20.0–300.0)
Cocaine (Metabolite): NEGATIVE ng/mL
Ethanol U, Quan: NEGATIVE %
METHADONE: NEGATIVE ng/mL
Meperidine: NEGATIVE ng/mL
OPIATE SCREEN URINE: NEGATIVE ng/mL
OXYCODONE+OXYMORPHONE UR QL SCN: NEGATIVE ng/mL
Phencyclidine: NEGATIVE ng/mL
Propoxyphene: NEGATIVE ng/mL
TRAMADOL: NEGATIVE ng/mL

## 2015-09-12 LAB — SEDIMENTATION RATE: Sed Rate: 2 mm/hr (ref 0–30)

## 2015-09-12 LAB — COMPREHENSIVE METABOLIC PANEL
ALBUMIN: 4.4 g/dL (ref 3.6–4.8)
ALT: 15 IU/L (ref 0–44)
AST: 14 IU/L (ref 0–40)
Albumin/Globulin Ratio: 2.1 (ref 1.2–2.2)
Alkaline Phosphatase: 103 IU/L (ref 39–117)
BUN / CREAT RATIO: 8 — AB (ref 10–24)
BUN: 13 mg/dL (ref 8–27)
Bilirubin Total: 0.3 mg/dL (ref 0.0–1.2)
CALCIUM: 9.6 mg/dL (ref 8.6–10.2)
CO2: 25 mmol/L (ref 18–29)
CREATININE: 1.53 mg/dL — AB (ref 0.76–1.27)
Chloride: 102 mmol/L (ref 96–106)
GFR calc Af Amer: 55 mL/min/{1.73_m2} — ABNORMAL LOW (ref >59)
GFR, EST NON AFRICAN AMERICAN: 48 mL/min/{1.73_m2} — AB (ref >59)
GLOBULIN, TOTAL: 2.1 g/dL (ref 1.5–4.5)
Glucose: 95 mg/dL (ref 65–99)
Potassium: 5.5 mmol/L — ABNORMAL HIGH (ref 3.5–5.2)
SODIUM: 144 mmol/L (ref 134–144)
Total Protein: 6.5 g/dL (ref 6.0–8.5)

## 2015-09-12 LAB — CBC WITH DIFFERENTIAL/PLATELET
BASOS ABS: 0 10*3/uL (ref 0.0–0.2)
Basos: 0 %
EOS (ABSOLUTE): 0.3 10*3/uL (ref 0.0–0.4)
Eos: 4 %
Hematocrit: 42.5 % (ref 37.5–51.0)
Hemoglobin: 14.1 g/dL (ref 12.6–17.7)
IMMATURE GRANS (ABS): 0 10*3/uL (ref 0.0–0.1)
Immature Granulocytes: 0 %
LYMPHS: 23 %
Lymphocytes Absolute: 1.5 10*3/uL (ref 0.7–3.1)
MCH: 29.9 pg (ref 26.6–33.0)
MCHC: 33.2 g/dL (ref 31.5–35.7)
MCV: 90 fL (ref 79–97)
MONOS ABS: 0.6 10*3/uL (ref 0.1–0.9)
Monocytes: 9 %
NEUTROS ABS: 4.1 10*3/uL (ref 1.4–7.0)
NEUTROS PCT: 64 %
PLATELETS: 268 10*3/uL (ref 150–379)
RBC: 4.71 x10E6/uL (ref 4.14–5.80)
RDW: 14.4 % (ref 12.3–15.4)
WBC: 6.5 10*3/uL (ref 3.4–10.8)

## 2015-09-12 LAB — URIC ACID: Uric Acid: 7.2 mg/dL (ref 3.7–8.6)

## 2015-09-12 NOTE — Telephone Encounter (Signed)
Patient has an inconsistent urine drug screen. It did not show alprazolam or oxycodone in his urine, and patient is believed to be taking both medications regularly for anxiety and chronic neck and shoulder pain. Therefore, no further controlled substances will be prescribed from this office and he needs to be referred to pain clinic and psychiatry to take over management.

## 2015-09-12 NOTE — Telephone Encounter (Signed)
Wife asked if you could send in enough to cover pt until his appointment on Monday?

## 2015-09-12 NOTE — Telephone Encounter (Signed)
Requesting return call: states that his Remus Lofflerambien was denied but stated that he has appointment for 09-17-15. Requesting return call

## 2015-09-17 ENCOUNTER — Ambulatory Visit (INDEPENDENT_AMBULATORY_CARE_PROVIDER_SITE_OTHER): Payer: Self-pay | Admitting: Family Medicine

## 2015-09-17 ENCOUNTER — Encounter: Payer: Self-pay | Admitting: Family Medicine

## 2015-09-17 VITALS — BP 124/80 | HR 96 | Temp 97.5°F | Resp 18 | Ht 65.0 in | Wt 167.6 lb

## 2015-09-17 DIAGNOSIS — G4700 Insomnia, unspecified: Secondary | ICD-10-CM

## 2015-09-17 DIAGNOSIS — I10 Essential (primary) hypertension: Secondary | ICD-10-CM

## 2015-09-17 DIAGNOSIS — E785 Hyperlipidemia, unspecified: Secondary | ICD-10-CM

## 2015-09-17 DIAGNOSIS — F419 Anxiety disorder, unspecified: Secondary | ICD-10-CM

## 2015-09-17 MED ORDER — LISINOPRIL 10 MG PO TABS: 10.0000 mg | ORAL_TABLET | Freq: Every day | ORAL | Status: DC

## 2015-09-17 MED ORDER — SIMVASTATIN 40 MG PO TABS: 40.0000 mg | ORAL_TABLET | Freq: Every day | ORAL | Status: DC

## 2015-09-17 MED ORDER — ZOLPIDEM TARTRATE 10 MG PO TABS: 10.0000 mg | ORAL_TABLET | Freq: Every day | ORAL | Status: DC

## 2015-09-17 MED ORDER — ALPRAZOLAM 0.5 MG PO TABS: 0.5000 mg | ORAL_TABLET | Freq: Two times a day (BID) | ORAL | Status: DC | PRN

## 2015-09-17 NOTE — Progress Notes (Signed)
Name: Billy Cisneros   MRN: [id number]    DOB: 21-Jun-1950   Date:09/17/2015       Progress Note  Subjective  Chief Complaint  Chief Complaint  Patient presents with  . Pain    3 month follow up  . Anxiety  . Hypertension    Anxiety Presents for follow-up visit. The problem has been unchanged. Symptoms include excessive worry, insomnia, nervous/anxious behavior and panic. Patient reports no chest pain, palpitations or shortness of breath. The severity of symptoms is moderate and causing significant distress. The symptoms are aggravated by work stress.   His past medical history is significant for depression. Past treatments include benzodiazephines.  Hypertension This is a chronic problem. The problem is controlled. Associated symptoms include anxiety and neck pain. Pertinent negatives include no chest pain, headaches, palpitations or shortness of breath. Past treatments include ACE inhibitors.  Insomnia Primary symptoms: sleep disturbance, difficulty falling asleep.  The onset quality is gradual. 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  PMH includes: depression.  Hyperlipidemia This is a chronic problem. The problem is controlled. Pertinent negatives include no chest pain, myalgias or shortness of breath. Current antihyperlipidemic treatment includes statins.    Past Medical History  Diagnosis Date  . Hypertension   . MI (myocardial infarction) (HCC)   . Anxiety   . GERD (gastroesophageal reflux disease)   . Gout     Past Surgical History  Procedure Laterality Date  . Cardiac surgery      History reviewed. No pertinent family history.  Social History   Social History  . Marital Status: Married    Spouse Name: N/A  . Number of Children: N/A  . Years of Education: N/A   Occupational History  . Not on file.   Social History Main Topics  . Smoking status: Never Smoker   . Smokeless tobacco: Not on file  . Alcohol Use:  0.0 oz/week    0 Standard drinks or equivalent per week  . Drug Use: 2.00 per week    Special: Cocaine  . Sexual Activity: Yes    Birth Control/ Protection: None   Other Topics Concern  . Not on file   Social History Narrative     Current outpatient prescriptions:  .  ALPRAZolam (XANAX) 0.5 MG tablet, Take 1 tablet (0.5 mg total) by mouth 2 (two) times daily as needed. for anxiety, Disp: 60 tablet, Rfl: 0 .  aspirin EC 81 MG tablet, Take 1 tablet (81 mg total) by mouth daily. For heart attack prevention., Disp: , Rfl:  .  benzonatate (TESSALON) 200 MG capsule, Take 1 capsule (200 mg total) by mouth 3 (three) times daily as needed for cough., Disp: 20 capsule, Rfl: 0 .  buPROPion (WELLBUTRIN XL) 300 MG 24 hr tablet, Take 1 tablet (300 mg total) by mouth daily., Disp: 90 tablet, Rfl: 0 .  clotrimazole-betamethasone (LOTRISONE) cream, , Disp: , Rfl:  .  lisinopril (PRINIVIL,ZESTRIL) 10 MG tablet, Take 1 tablet (10 mg total) by mouth daily. For elevated blood pressure., Disp: 90 tablet, Rfl: 0 .  omeprazole (PRILOSEC) 20 MG capsule, Take 1 capsule (20 mg total) by mouth 2 (two) times daily before a meal. For reflux., Disp: 180 capsule, Rfl: 0 .  oxyCODONE-acetaminophen (ROXICET) 5-325 MG tablet, Take 1 tablet by mouth daily as needed for severe pain., Disp: 30 tablet, Rfl: 0 .  simvastatin (ZOCOR) 40 MG tablet, Take 1 tablet (40 mg  total) by mouth daily., Disp: 90 tablet, Rfl: 0 .  zolpidem (AMBIEN) 10 MG tablet, Take 1 tablet (10 mg total) by mouth at bedtime., Disp: 30 tablet, Rfl: 2  No Known Allergies   Review of Systems  Constitutional: Negative for fever and chills.  Respiratory: Negative for shortness of breath.   Cardiovascular: Negative for chest pain and palpitations.  Musculoskeletal: Positive for neck pain. Negative for myalgias.  Neurological: Negative for headaches.  Psychiatric/Behavioral: Positive for depression and sleep disturbance. The patient is nervous/anxious and  has insomnia.      Objective  Filed Vitals:   09/17/15 1042  BP: 124/80  Pulse: 96  Temp: 97.5 F (36.4 C)  TempSrc: Oral  Resp: 18  Height: 5\' 5"  (1.651 m)  Weight: 167 lb 9.6 oz (76.023 kg)  SpO2: 96%    Physical Exam  Constitutional: He is oriented to person, place, and time and well-developed, well-nourished, and in no distress.  HENT:  Head: Normocephalic and atraumatic.  Cardiovascular: Normal rate and regular rhythm.   Pulmonary/Chest: Effort normal and breath sounds normal.  Abdominal: Soft. Bowel sounds are normal.  Neurological: He is alert and oriented to person, place, and time.  Nursing note and vitals reviewed.      Assessment & Plan  1. Anxiety Inconsistent urine drug screen, alprazolam was not detected. Explained that we will refer to psychiatry for further management and continuation of therapy with alprazolam. We'll prescribe 2 week supply and explained that he will not receive any further controlled substances from this practice. - ALPRAZolam (XANAX) 0.5 MG tablet; Take 1 tablet (0.5 mg total) by mouth 2 (two) times daily as needed. for anxiety  Dispense: 30 tablet; Refill: 0 - Ambulatory referral to Psychiatry  2. Cannot sleep Because of inconsistent urine drug screen, we'll refer to psychiatry for management and prescription of appropriate therapy. Explained that he will not receive any further controlled substances except for a 2 week supply until he gets established with a psychiatrist. - zolpidem (AMBIEN) 10 MG tablet; Take 1 tablet (10 mg total) by mouth at bedtime.  Dispense: 15 tablet; Refill: 0 - Ambulatory referral to Psychiatry  3. Dyslipidemia FLP from August 2016 reviewed, at goal. Continue on statin therapy. - simvastatin (ZOCOR) 40 MG tablet; Take 1 tablet (40 mg total) by mouth daily.  Dispense: 90 tablet; Refill: 0  4. Essential hypertension Blood pressure stable and controlled on present antihypertensive therapy. - lisinopril  (PRINIVIL,ZESTRIL) 10 MG tablet; Take 1 tablet (10 mg total) by mouth daily. For elevated blood pressure.  Dispense: 90 tablet; Refill: 0   Kimmi Acocella Asad A. Faylene KurtzShah [facility] 09/17/2015 11:06 AM

## 2015-11-11 ENCOUNTER — Other Ambulatory Visit: Payer: Self-pay | Admitting: Family Medicine

## 2015-11-15 ENCOUNTER — Other Ambulatory Visit: Payer: Self-pay | Admitting: Family Medicine

## 2015-12-07 ENCOUNTER — Other Ambulatory Visit: Payer: Self-pay | Admitting: Family Medicine

## 2015-12-24 ENCOUNTER — Ambulatory Visit: Payer: Self-pay | Admitting: Family Medicine

## 2015-12-29 ENCOUNTER — Other Ambulatory Visit: Payer: Self-pay | Admitting: Family Medicine

## 2016-01-09 ENCOUNTER — Other Ambulatory Visit: Payer: Self-pay | Admitting: Family Medicine

## 2016-01-09 DIAGNOSIS — E785 Hyperlipidemia, unspecified: Secondary | ICD-10-CM

## 2016-01-21 ENCOUNTER — Encounter: Payer: Self-pay | Admitting: Family Medicine

## 2016-01-21 ENCOUNTER — Ambulatory Visit (INDEPENDENT_AMBULATORY_CARE_PROVIDER_SITE_OTHER): Payer: Self-pay | Admitting: Family Medicine

## 2016-01-21 VITALS — BP 142/70 | HR 94 | Temp 98.0°F | Resp 18 | Ht 65.0 in | Wt 171.7 lb

## 2016-01-21 DIAGNOSIS — I10 Essential (primary) hypertension: Secondary | ICD-10-CM

## 2016-01-21 DIAGNOSIS — F3342 Major depressive disorder, recurrent, in full remission: Secondary | ICD-10-CM

## 2016-01-21 DIAGNOSIS — K219 Gastro-esophageal reflux disease without esophagitis: Secondary | ICD-10-CM

## 2016-01-21 DIAGNOSIS — E785 Hyperlipidemia, unspecified: Secondary | ICD-10-CM

## 2016-01-21 DIAGNOSIS — R079 Chest pain, unspecified: Secondary | ICD-10-CM

## 2016-01-21 DIAGNOSIS — G4700 Insomnia, unspecified: Secondary | ICD-10-CM

## 2016-01-21 DIAGNOSIS — F419 Anxiety disorder, unspecified: Secondary | ICD-10-CM

## 2016-01-21 MED ORDER — ALPRAZOLAM 0.5 MG PO TABS: 0.5000 mg | ORAL_TABLET | Freq: Three times a day (TID) | ORAL | 0 refills | Status: DC | PRN

## 2016-01-21 MED ORDER — OMEPRAZOLE 20 MG PO CPDR: 20.0000 mg | DELAYED_RELEASE_CAPSULE | Freq: Two times a day (BID) | ORAL | 0 refills | Status: DC

## 2016-01-21 MED ORDER — LISINOPRIL 10 MG PO TABS: 10.0000 mg | ORAL_TABLET | Freq: Every day | ORAL | 0 refills | Status: DC

## 2016-01-21 MED ORDER — SIMVASTATIN 40 MG PO TABS: 40.0000 mg | ORAL_TABLET | Freq: Every day | ORAL | 1 refills | Status: DC

## 2016-01-21 MED ORDER — ZOLPIDEM TARTRATE 10 MG PO TABS: 10.0000 mg | ORAL_TABLET | Freq: Every day | ORAL | 0 refills | Status: DC

## 2016-01-21 MED ORDER — BUPROPION HCL ER (XL) 300 MG PO TB24: 300.0000 mg | ORAL_TABLET | Freq: Every day | ORAL | 0 refills | Status: DC

## 2016-01-21 NOTE — Progress Notes (Signed)
Name: Billy AmassKenneth H Pella   MRN: [id number]    DOB: 11/23/1950   Date:01/21/2016       Progress Note  Subjective  Chief Complaint  Chief Complaint  Patient presents with  . Hyperlipidemia    follow up, medication refills  . Hypertension  . Anxiety    Hyperlipidemia  This is a chronic problem. The problem is controlled. Recent lipid tests were reviewed and are normal. Associated symptoms include chest pain (has been having occasional chest pains, starts when he is feeling anxious) and shortness of breath. Pertinent negatives include no leg pain or myalgias. Current antihyperlipidemic treatment includes statins.  Hypertension  Associated symptoms include anxiety, chest pain (has been having occasional chest pains, starts when he is feeling anxious) and shortness of breath. Pertinent negatives include no blurred vision, headaches or palpitations. Past treatments include ACE inhibitors.  Anxiety  Presents for follow-up visit. Symptoms include chest pain (has been having occasional chest pains, starts when he is feeling anxious), excessive worry, insomnia and shortness of breath. Patient reports no palpitations. The severity of symptoms is moderate and causing significant distress. The quality of sleep is fair.      Past Medical History:  Diagnosis Date  . Anxiety   . GERD (gastroesophageal reflux disease)   . Gout   . Hypertension   . MI (myocardial infarction) [facility](HCC)     Past Surgical History:  Procedure Laterality Date  . CARDIAC SURGERY      No family history on file.  Social History   Social History  . Marital status: Married    Spouse name: N/A  . Number of children: N/A  . Years of education: N/A   Occupational History  . Not on file.   Social History Main Topics  . Smoking status: Never Smoker  . Smokeless tobacco: Not on file  . Alcohol use 0.0 oz/week  . Drug use:     Frequency: 2.0 times per week    Types: Cocaine  . Sexual activity: Yes    Birth control/  protection: None   Other Topics Concern  . Not on file   Social History Narrative  . No narrative on file     Current Outpatient Prescriptions:  .  ALPRAZolam (XANAX) 0.5 MG tablet, Take 1 tablet (0.5 mg total) by mouth 2 (two) times daily as needed. for anxiety, Disp: 30 tablet, Rfl: 0 .  aspirin EC 81 MG tablet, Take 1 tablet (81 mg total) by mouth daily. For heart attack prevention., Disp: , Rfl:  .  buPROPion (WELLBUTRIN XL) 300 MG 24 hr tablet, TAKE 1 TABLET BY MOUTH DAILY, Disp: 90 tablet, Rfl: 0 .  clotrimazole-betamethasone (LOTRISONE) cream, , Disp: , Rfl:  .  lisinopril (PRINIVIL,ZESTRIL) 10 MG tablet, TAKE 1 TABLET BY MOUTH DAILY FOR ELEVASTED BLOOD PRESSURE, Disp: 90 tablet, Rfl: 0 .  omeprazole (PRILOSEC) 20 MG capsule, TAKE ONE (1) CAPSULE BY MOUTH 2 TIMES DAILY BEFORE A MEAL FOR REFLUX., Disp: 180 capsule, Rfl: 0 .  simvastatin (ZOCOR) 40 MG tablet, TAKE 1 TABLET BY MOUTH DAILY, Disp: 90 tablet, Rfl: 1 .  zolpidem (AMBIEN) 10 MG tablet, Take 1 tablet (10 mg total) by mouth at bedtime., Disp: 15 tablet, Rfl: 0 .  oxyCODONE-acetaminophen (ROXICET) 5-325 MG tablet, Take 1 tablet by mouth daily as needed for severe pain. (Patient not taking: Reported on 01/21/2016), Disp: 30 tablet, Rfl: 0  No Known Allergies   Review of Systems  Eyes: Negative for blurred vision.  Respiratory: Positive  for shortness of breath.   Cardiovascular: Positive for chest pain (has been having occasional chest pains, starts when he is feeling anxious). Negative for palpitations.  Musculoskeletal: Negative for myalgias.  Neurological: Negative for headaches.  Psychiatric/Behavioral: The patient has insomnia.      Objective  Vitals:   01/21/16 1026  BP: (!) 142/70  Pulse: 94  Resp: 18  Temp: 98 F (36.7 C)  TempSrc: Oral  SpO2: 96%  Weight: 171 lb 11.2 oz (77.9 kg)  Height: 5\' 5"  (1.651 m)    Physical Exam  Constitutional: He is oriented to person, place, and time and well-developed,  well-nourished, and in no distress.  HENT:  Head: Normocephalic and atraumatic.  Cardiovascular: Normal rate, regular rhythm and normal heart sounds.   No murmur heard. Pulmonary/Chest: Effort normal and breath sounds normal. He has no wheezes.  Abdominal: Soft. Bowel sounds are normal. There is no tenderness.  Musculoskeletal: He exhibits no edema.  Neurological: He is alert and oriented to person, place, and time.  Psychiatric: Mood, memory, affect and judgment normal.  Nursing note and vitals reviewed.     Assessment & Plan  1. Anxiety Worsening anxiety evidenced by recurrent and distressing symptoms. Increase alprazolam to 0.5 mg 3 times a day when necessary, aware of the dependence potential, drug interactions, and side effects of benzodiazepines. Refills provided - ALPRAZolam (XANAX) 0.5 MG tablet; Take 1 tablet (0.5 mg total) by mouth 3 (three) times daily as needed for anxiety. for anxiety  Dispense: 90 tablet; Refill: 0  2. Cannot sleep  - zolpidem (AMBIEN) 10 MG tablet; Take 1 tablet (10 mg total) by mouth at bedtime.  Dispense: 30 tablet; Refill: 0  3. Dyslipidemia  - COMPLETE METABOLIC PANEL WITH GFR - Lipid Profile - simvastatin (ZOCOR) 40 MG tablet; Take 1 tablet (40 mg total) by mouth daily.  Dispense: 90 tablet; Refill: 1  4. Gastroesophageal reflux disease, esophagitis presence not specified  - omeprazole (PRILOSEC) 20 MG capsule; Take 1 capsule (20 mg total) by mouth 2 (two) times daily before a meal.  Dispense: 180 capsule; Refill: 0  5. Chest pain, unspecified chest pain type EKG is normal sinus rhythm, given his direct history, recommended that he consult cardiologist for repeat workup of chest pain to rule out cardiac etiologies. Patient verbalized agreement - EKG 12-Lead  6. Recurrent major depressive disorder, in full remission (HCC)  - buPROPion (WELLBUTRIN XL) 300 MG 24 hr tablet; Take 1 tablet (300 mg total) by mouth daily.  Dispense: 90 tablet;  Refill: 0  7. Essential hypertension  - lisinopril (PRINIVIL,ZESTRIL) 10 MG tablet; Take 1 tablet (10 mg total) by mouth daily.  Dispense: 90 tablet; Refill: 0   Shenaya Lebo Asad A. Faylene Kurtz [facility] 01/21/2016 10:44 AM

## 2016-02-11 ENCOUNTER — Emergency Department
Admission: EM | Admit: 2016-02-11 | Discharge: 2016-02-11 | Disposition: A | Discharge status: Home / Self Care | Payer: BLUE CROSS/BLUE SHIELD | Attending: Student | Admitting: Student

## 2016-02-11 ENCOUNTER — Emergency Department: Payer: BLUE CROSS/BLUE SHIELD

## 2016-02-11 ENCOUNTER — Encounter: Payer: Self-pay | Admitting: Emergency Medicine

## 2016-02-11 DIAGNOSIS — I252 Old myocardial infarction: Secondary | ICD-10-CM | POA: Insufficient documentation

## 2016-02-11 DIAGNOSIS — L03031 Cellulitis of right toe: Secondary | ICD-10-CM

## 2016-02-11 DIAGNOSIS — Z79899 Other long term (current) drug therapy: Secondary | ICD-10-CM | POA: Insufficient documentation

## 2016-02-11 DIAGNOSIS — Z7982 Long term (current) use of aspirin: Secondary | ICD-10-CM | POA: Insufficient documentation

## 2016-02-11 DIAGNOSIS — Z791 Long term (current) use of non-steroidal anti-inflammatories (NSAID): Secondary | ICD-10-CM | POA: Insufficient documentation

## 2016-02-11 DIAGNOSIS — I10 Essential (primary) hypertension: Secondary | ICD-10-CM | POA: Insufficient documentation

## 2016-02-11 DIAGNOSIS — Z951 Presence of aortocoronary bypass graft: Secondary | ICD-10-CM | POA: Insufficient documentation

## 2016-02-11 DIAGNOSIS — I2510 Atherosclerotic heart disease of native coronary artery without angina pectoris: Secondary | ICD-10-CM | POA: Insufficient documentation

## 2016-02-11 MED ORDER — OXYCODONE HCL 5 MG PO TABS
10.0000 mg | ORAL_TABLET | Freq: Once | ORAL | Status: AC
  Administered 2016-02-11: 10 mg via ORAL
  Filled 2016-02-11: qty 2

## 2016-02-11 MED ORDER — IBUPROFEN 600 MG PO TABS: 600.0000 mg | ORAL_TABLET | Freq: Three times a day (TID) | ORAL | 0 refills | Status: DC | PRN

## 2016-02-11 MED ORDER — CLINDAMYCIN HCL 300 MG PO CAPS: 300.0000 mg | ORAL_CAPSULE | Freq: Four times a day (QID) | ORAL | 0 refills | Status: AC

## 2016-02-11 MED ORDER — OXYCODONE HCL 5 MG PO TABS: 5.0000 mg | ORAL_TABLET | Freq: Four times a day (QID) | ORAL | 0 refills | Status: DC | PRN

## 2016-02-11 NOTE — ED Notes (Signed)

## 2016-02-11 NOTE — ED Provider Notes (Signed)
[facility] Emergency Department Provider Note   ____________________________________________   First MD Initiated Contact with Patient 02/11/16 [phone]     (approximate)  I have reviewed the triage vital signs and the nursing notes.   HISTORY  Chief Complaint Foot Swelling    HPI Tiajuana AmassKenneth H Pardoe is a 65 y.o. male history of coronary artery disease, gout, hypertension, hyperlipidemia who presents for evaluation of swelling and redness to the right hallux toe, atraumatic, gradual onset 1 week ago, constant, moderate to severe, worse with weightbearing. Reports he has had gout in the past but reports that the pain in his toe currently is less severe. He denies any chest pain, difficulty breathing, fevers, chills, vomiting, diarrhea or any associated leg swelling/swelling beyond the foot/toe.   Past Medical History:  Diagnosis Date  . Anxiety   . GERD (gastroesophageal reflux disease)   . Gout   . Hypertension   . MI (myocardial infarction)     Patient Active Problem List   Diagnosis Date Noted  . GERD (gastroesophageal reflux disease) 01/21/2016  . Pain and swelling of right wrist 08/13/2015  . Productive cough 08/13/2015  . Chronic right shoulder pain 06/12/2015  . Cannot sleep 06/12/2015  . Dyslipidemia 03/12/2015  . Cervical radiculopathy 01/08/2015  . Annual physical exam 01/01/2015  . H/O coronary artery bypass surgery 12/11/2014  . Hypertension 12/04/2014  . Anxiety 12/04/2014  . Chest pain 12/04/2014  . Major depression 11/21/2011    Past Surgical History:  Procedure Laterality Date  . CARDIAC SURGERY      Prior to Admission medications   Medication Sig Start Date End Date Taking? Authorizing Provider  ALPRAZolam Prudy Feeler(XANAX) 0.5 MG tablet Take 1 tablet (0.5 mg total) by mouth 3 (three) times daily as needed for anxiety. for anxiety 01/21/16   Ellyn HackSyed Asad A Shah, MD  aspirin EC 81 MG tablet Take 1 tablet (81 mg total) by mouth daily. For  heart attack prevention. 11/22/11   Tamala JulianNeil T Mashburn, PA-C  buPROPion (WELLBUTRIN XL) 300 MG 24 hr tablet Take 1 tablet (300 mg total) by mouth daily. 01/21/16   Ellyn HackSyed Asad A Shah, MD  clindamycin (CLEOCIN) 300 MG capsule Take 1 capsule (300 mg total) by mouth 4 (four) times daily. 02/11/16 02/18/16  Gayla DossEryka A Jourden Delmont, MD  clotrimazole-betamethasone (LOTRISONE) cream  09/04/14   Historical Provider, MD  ibuprofen (ADVIL,MOTRIN) 600 MG tablet Take 1 tablet (600 mg total) by mouth every 8 (eight) hours as needed for moderate pain. 02/11/16   Gayla DossEryka A Kirat Mezquita, MD  lisinopril (PRINIVIL,ZESTRIL) 10 MG tablet Take 1 tablet (10 mg total) by mouth daily. 01/21/16   Ellyn HackSyed Asad A Shah, MD  omeprazole (PRILOSEC) 20 MG capsule Take 1 capsule (20 mg total) by mouth 2 (two) times daily before a meal. 01/21/16   Ellyn HackSyed Asad A Shah, MD  oxyCODONE (ROXICODONE) 5 MG immediate release tablet Take 1 tablet (5 mg total) by mouth every 6 (six) hours as needed for moderate pain. Do not drive while taking this medication. 02/11/16   Gayla DossEryka A Amybeth Sieg, MD  oxyCODONE-acetaminophen (ROXICET) 5-325 MG tablet Take 1 tablet by mouth daily as needed for severe pain. Patient not taking: Reported on 01/21/2016 08/13/15   Ellyn HackSyed Asad A Shah, MD  simvastatin (ZOCOR) 40 MG tablet Take 1 tablet (40 mg total) by mouth daily. 01/21/16   Ellyn HackSyed Asad A Shah, MD  zolpidem (AMBIEN) 10 MG tablet Take 1 tablet (10 mg total) by mouth at bedtime. 01/21/16  Ellyn Hack, MD    Allergies Review of patient's allergies indicates no known allergies.  History reviewed. No pertinent family history.  Social History Social History  Substance Use Topics  . Smoking status: Never Smoker  . Smokeless tobacco: Never Used  . Alcohol use 0.0 oz/week    Review of Systems Constitutional: No fever/chills Eyes: No visual changes. ENT: No sore throat. Cardiovascular: Denies chest pain. Respiratory: Denies shortness of breath. Gastrointestinal: No abdominal pain.  No nausea, no  vomiting.  No diarrhea.  No constipation. Genitourinary: Negative for dysuria. Musculoskeletal: Negative for back pain. Skin: Negative for rash. Neurological: Negative for headaches, focal weakness or numbness.  10-point ROS otherwise negative.  ____________________________________________   PHYSICAL EXAM:  Vitals:   02/11/16 0256 02/11/16 0258 02/11/16 0800  BP: 118/72  119/84  Pulse: 93  81  Resp: 16  18  Temp: 97.3 F (36.3 C)    TempSrc: Oral    SpO2: 97%  97%  Weight:  171 lb (77.6 kg)   Height:  5\' 5"  (1.651 m)     VITAL SIGNS: ED Triage Vitals  Enc Vitals Group     BP 02/11/16 0256 118/72     Pulse Rate 02/11/16 0256 93     Resp 02/11/16 0256 16     Temp 02/11/16 0256 97.3 F (36.3 C)     Temp Source 02/11/16 0256 Oral     SpO2 02/11/16 0256 97 %     Weight 02/11/16 0258 171 lb (77.6 kg)     Height 02/11/16 0258 5\' 5"  (1.651 m)     Head Circumference --      Peak Flow --      Pain Score 02/11/16 0259 10     Pain Loc --      Pain Edu? --      Excl. in GC? --     Constitutional: Alert and oriented. Well appearing and in no acute distress. Eyes: Conjunctivae are normal. PERRL. EOMI. Head: Atraumatic. Nose: No congestion/rhinnorhea. Mouth/Throat: Mucous membranes are moist.  Oropharynx non-erythematous. Neck: No stridor.  Supple without meningismus. Cardiovascular: Normal rate, regular rhythm. Grossly normal heart sounds.  Good peripheral circulation. Respiratory: Normal respiratory effort.  No retractions. Lungs CTAB. Gastrointestinal: Soft and nontender. No distention.  No CVA tenderness. Genitourinary: deferred Musculoskeletal: There is moderate warmth and erythema as well as tenderness associated with the right hallux toe, there is tenderness at the MTP joint as well as in the proximal phalanx of the toe. The patient is able to wiggle all of the toes, he has normal tendon function/mobility of the toes and can flex extend regularly. 2+ right DP pulse. No  calf swelling, tenderness, or asymmetry. Neurologic:  Normal speech and language. No gross focal neurologic deficits are appreciated. No gait instability. Skin:  Skin is warm, dry and intact. No rash noted. Psychiatric: Mood and affect are normal. Speech and behavior are normal.  ____________________________________________   LABS (all labs ordered are listed, but only abnormal results are displayed)  Labs Reviewed - No data to display ____________________________________________  EKG  none ____________________________________________  RADIOLOGY  Right foot xray IMPRESSION:  No acute bony abnormality. No bony changes suggesting gout.       ____________________________________________   PROCEDURES  Procedure(s) performed: None  Procedures  Critical Care performed: No  ____________________________________________   INITIAL IMPRESSION / ASSESSMENT AND PLAN / ED COURSE  Pertinent labs & imaging results that were available during my care of the patient were reviewed  by me and considered in my medical decision making (see chart for details).  JAMES SENN is a 66 y.o. male history of coronary artery disease, gout, hypertension, hyperlipidemia who presents for evaluation of swelling and redness to the right hallux toe. On exam, he is very well-appearing and in no acute distress. Vital signs stable, he is afebrile. He is neurovascularly intact in the right foot. His exam is consistent with possibly a mild cellulitis of the right hallux though he does also have tenderness to palpation over the MTP joint and this could represent underlying gout as he clinically does fit that picture and given that he has a history of gout. His plain films are negative. The area has been marked and we discussed  return precautions for spread of redness, fever, etc. We'll discharge with clindamycin, ibuprofen as well as oxycodone for breakthrough pain. We discussed meticulous return  precautions, need for close PCP follow-up and he is comfortable with the discharge plan. DC home. Not consistent with septic arthritis or DVT.  Clinical Course     ____________________________________________   FINAL CLINICAL IMPRESSION(S) / ED DIAGNOSES  Final diagnoses:  Cellulitis of toe of right foot      NEW MEDICATIONS STARTED DURING THIS VISIT:  New Prescriptions   CLINDAMYCIN (CLEOCIN) 300 MG CAPSULE    Take 1 capsule (300 mg total) by mouth 4 (four) times daily.   IBUPROFEN (ADVIL,MOTRIN) 600 MG TABLET    Take 1 tablet (600 mg total) by mouth every 8 (eight) hours as needed for moderate pain.   OXYCODONE (ROXICODONE) 5 MG IMMEDIATE RELEASE TABLET    Take 1 tablet (5 mg total) by mouth every 6 (six) hours as needed for moderate pain. Do not drive while taking this medication.     Note:  This document was prepared using Dragon voice recognition software and may include unintentional dictation errors.    Gayla Doss, MD 02/11/16 [phone]

## 2016-02-11 NOTE — ED Triage Notes (Signed)
Pt presents to ED to be evaluated for right foot swelling, onset about a week ago. Redness noted and warm to touch.

## 2016-02-13 ENCOUNTER — Emergency Department: Payer: Self-pay

## 2016-02-13 ENCOUNTER — Emergency Department
Admission: EM | Admit: 2016-02-13 | Discharge: 2016-02-13 | Disposition: A | Discharge status: Home / Self Care | Payer: Self-pay | Attending: Emergency Medicine | Admitting: Emergency Medicine

## 2016-02-13 ENCOUNTER — Encounter: Payer: Self-pay | Admitting: Emergency Medicine

## 2016-02-13 DIAGNOSIS — Z79899 Other long term (current) drug therapy: Secondary | ICD-10-CM | POA: Insufficient documentation

## 2016-02-13 DIAGNOSIS — M722 Plantar fascial fibromatosis: Secondary | ICD-10-CM | POA: Insufficient documentation

## 2016-02-13 DIAGNOSIS — I10 Essential (primary) hypertension: Secondary | ICD-10-CM | POA: Insufficient documentation

## 2016-02-13 DIAGNOSIS — I252 Old myocardial infarction: Secondary | ICD-10-CM | POA: Insufficient documentation

## 2016-02-13 DIAGNOSIS — Z7982 Long term (current) use of aspirin: Secondary | ICD-10-CM | POA: Insufficient documentation

## 2016-02-13 MED ORDER — NAPROXEN 500 MG PO TABS: 500.0000 mg | ORAL_TABLET | Freq: Two times a day (BID) | ORAL | 0 refills | Status: DC

## 2016-02-13 MED ORDER — TRAMADOL HCL 50 MG PO TABS: 50.0000 mg | ORAL_TABLET | Freq: Four times a day (QID) | ORAL | 0 refills | Status: DC | PRN

## 2016-02-13 NOTE — ED Triage Notes (Signed)
Left foot pain and swelling.  No injury

## 2016-02-14 NOTE — ED Provider Notes (Signed)
[facility] Emergency Department Provider Note ____________________________________________  Time seen: Approximately 4:24 PM  I have reviewed the triage vital signs and the nursing notes.   HISTORY  Chief Complaint Foot Pain    HPI Billy Cisneros is a 65 y.o. male who presents to the emergency department for evaluation of left foot pain. Patient reports that the pain in his left foot began from the time he woke up 2 days ago and has been constant.  He states that he is unable to bear weight on the foot and has difficulty with ROM of the left ankle due to pain.  Patient has difficulty localizing the pain, but states that he has pain all over the left foot, left ankle, and some pain radiating into the left lower leg.  Pain has not been relieved with elevation of foot or oxycodone.  He endorses redness of the medial aspect of the left foot.  He denies any injury, trauma, or falls.  He denies fever, chills, numbness, or tingling.  Patient reports being seen at this ED 3 days ago for pain, swelling, and redness of his right foot and he was diagnosed with cellulitis.  Patient was prescribed clindamycin and oxycodone and has been taking as prescribed.  Patient states that the pain, redness, and swelling in his right foot has been decreasing and states that the pain of his left foot is different than that of his right foot.  Past Medical History:  Diagnosis Date  . Anxiety   . GERD (gastroesophageal reflux disease)   . Gout   . Hypertension   . MI (myocardial infarction)     Patient Active Problem List   Diagnosis Date Noted  . GERD (gastroesophageal reflux disease) 01/21/2016  . Pain and swelling of right wrist 08/13/2015  . Productive cough 08/13/2015  . Chronic right shoulder pain 06/12/2015  . Cannot sleep 06/12/2015  . Dyslipidemia 03/12/2015  . Cervical radiculopathy 01/08/2015  . Annual physical exam 01/01/2015  . H/O coronary artery bypass surgery  12/11/2014  . Hypertension 12/04/2014  . Anxiety 12/04/2014  . Chest pain 12/04/2014  . Major depression 11/21/2011    Past Surgical History:  Procedure Laterality Date  . CARDIAC SURGERY      Prior to Admission medications   Medication Sig Start Date End Date Taking? Authorizing Provider  ALPRAZolam Prudy Feeler) 0.5 MG tablet Take 1 tablet (0.5 mg total) by mouth 3 (three) times daily as needed for anxiety. for anxiety 01/21/16   Ellyn Hack, MD  aspirin EC 81 MG tablet Take 1 tablet (81 mg total) by mouth daily. For heart attack prevention. 11/22/11   Tamala Julian, PA-C  buPROPion (WELLBUTRIN XL) 300 MG 24 hr tablet Take 1 tablet (300 mg total) by mouth daily. 01/21/16   Ellyn Hack, MD  clindamycin (CLEOCIN) 300 MG capsule Take 1 capsule (300 mg total) by mouth 4 (four) times daily. 02/11/16 02/18/16  Gayla Doss, MD  clotrimazole-betamethasone (LOTRISONE) cream  09/04/14   Historical Provider, MD  lisinopril (PRINIVIL,ZESTRIL) 10 MG tablet Take 1 tablet (10 mg total) by mouth daily. 01/21/16   Ellyn Hack, MD  naproxen (NAPROSYN) 500 MG tablet Take 1 tablet (500 mg total) by mouth 2 (two) times daily with a meal. 02/13/16   Chinita Pester, FNP  omeprazole (PRILOSEC) 20 MG capsule Take 1 capsule (20 mg total) by mouth 2 (two) times daily before a meal. 01/21/16   Ellyn Hack,  MD  oxyCODONE (ROXICODONE) 5 MG immediate release tablet Take 1 tablet (5 mg total) by mouth every 6 (six) hours as needed for moderate pain. Do not drive while taking this medication. 02/11/16   Gayla DossEryka A Gayle, MD  simvastatin (ZOCOR) 40 MG tablet Take 1 tablet (40 mg total) by mouth daily. 01/21/16   Ellyn HackSyed Asad A Shah, MD  traMADol (ULTRAM) 50 MG tablet Take 1 tablet (50 mg total) by mouth every 6 (six) hours as needed. 02/13/16   Chinita Pesterari B Kaula Klenke, FNP  zolpidem (AMBIEN) 10 MG tablet Take 1 tablet (10 mg total) by mouth at bedtime. 01/21/16   Ellyn HackSyed Asad A Shah, MD    Allergies Review of patient's allergies  indicates no known allergies.  No family history on file.  Social History Social History  Substance Use Topics  . Smoking status: Never Smoker  . Smokeless tobacco: Never Used  . Alcohol use 0.0 oz/week    Review of Systems Constitutional: No recent illness. No fever or chills. Cardiovascular: Denies chest pain or palpitations. Respiratory: Denies shortness of breath. Musculoskeletal: Pain in left foot as described above.  Decreased ROM of left ankle. Skin: Negative for rash, wound, lesion. Positive for redness of the left foot. Resolving cellulitis on the right great toe and foot. Neurological: Negative for focal weakness or numbness.  ____________________________________________   PHYSICAL EXAM:  VITAL SIGNS: ED Triage Vitals  Enc Vitals Group     BP 02/13/16 1618 109/72     Pulse Rate 02/13/16 1618 93     Resp 02/13/16 1618 18     Temp 02/13/16 1618 98 F (36.7 C)     Temp Source 02/13/16 1618 Oral     SpO2 02/13/16 1618 95 %     Weight 02/13/16 1616 171 lb (77.6 kg)     Height 02/13/16 1616 5\' 5"  (1.651 m)     Head Circumference --      Peak Flow --      Pain Score 02/13/16 1616 9     Pain Loc --      Pain Edu? --      Excl. in GC? --     Constitutional: Alert and oriented. Well appearing and in no acute distress. Eyes: Conjunctivae are normal. EOMI. Head: Atraumatic. Neck: No stridor.  Respiratory: Normal respiratory effort.   Musculoskeletal: Tenderness on palpation of the left foot, ankle, and lower posterior leg, with greatest tenderness along the arch of the foot.  ROM of left ankle and toes limited by pain.   Neurologic:  Normal speech and language. No gross focal neurologic deficits are appreciated. Speech is normal. No gait instability. Skin:  Skin is warm, dry and intact. Atraumatic. Mild erythema along medial aspect of the left foot arch. Psychiatric: Mood and affect are normal. Speech and behavior are  normal.  ____________________________________________   LABS (all labs ordered are listed, but only abnormal results are displayed)  Labs Reviewed - No data to display ____________________________________________  RADIOLOGY  Negative for acute bony abnormality of the left foot/ankle.  ____________________________________________   PROCEDURES  Procedure(s) performed: Ace bandage applied by ER tech and crutches were fitted to height. He will use 1 crutch under the left side for assistance with weight bearing.    ____________________________________________   INITIAL IMPRESSION / ASSESSMENT AND PLAN / ED COURSE  Clinical Course    Pertinent labs & imaging results that were available during my care of the patient were reviewed by me and considered in my medical  decision making (see chart for details).  Patient will be written prescriptions for naproxen and tramadol. He is to follow up with podiatry for symptoms not improving with RICE and medications. He was advised to return to the ER for symptoms that change or worsen if unable to schedule an appointment with podiatry or PCP. ____________________________________________   FINAL CLINICAL IMPRESSION(S) / ED DIAGNOSES  Final diagnoses:  Plantar fasciitis of left foot       Chinita Pester, FNP 02/14/16 1546    Sharman Cheek, MD 02/15/16 1712

## 2016-02-19 ENCOUNTER — Encounter: Payer: Self-pay | Admitting: Family Medicine

## 2016-02-19 ENCOUNTER — Ambulatory Visit (INDEPENDENT_AMBULATORY_CARE_PROVIDER_SITE_OTHER): Payer: Self-pay | Admitting: Family Medicine

## 2016-02-19 DIAGNOSIS — M10071 Idiopathic gout, right ankle and foot: Secondary | ICD-10-CM

## 2016-02-19 DIAGNOSIS — M109 Gout, unspecified: Secondary | ICD-10-CM | POA: Insufficient documentation

## 2016-02-19 LAB — COMPLETE METABOLIC PANEL WITH GFR
ALT: 14 U/L (ref 9–46)
AST: 14 U/L (ref 10–35)
Albumin: 4.3 g/dL (ref 3.6–5.1)
Alkaline Phosphatase: 109 U/L (ref 40–115)
BUN: 12 mg/dL (ref 7–25)
CALCIUM: 9.2 mg/dL (ref 8.6–10.3)
CHLORIDE: 105 mmol/L (ref 98–110)
CO2: 27 mmol/L (ref 20–31)
CREATININE: 1.73 mg/dL — AB (ref 0.70–1.25)
GFR, Est African American: 48 mL/min — ABNORMAL LOW (ref >=60)
GFR, Est Non African American: 41 mL/min — ABNORMAL LOW (ref >=60)
GLUCOSE: 79 mg/dL (ref 65–99)
POTASSIUM: 4.7 mmol/L (ref 3.5–5.3)
SODIUM: 142 mmol/L (ref 135–146)
Total Bilirubin: 0.5 mg/dL (ref 0.2–1.2)
Total Protein: 6.7 g/dL (ref 6.1–8.1)

## 2016-02-19 LAB — URIC ACID: Uric Acid, Serum: 5.9 mg/dL (ref 4.0–8.0)

## 2016-02-19 MED ORDER — OXYCODONE HCL 5 MG PO TABS: 5.0000 mg | ORAL_TABLET | Freq: Three times a day (TID) | ORAL | 0 refills | Status: AC | PRN

## 2016-02-19 MED ORDER — PREDNISONE 10 MG (21) PO TBPK: 10.0000 mg | ORAL_TABLET | Freq: Every day | ORAL | 0 refills | Status: DC

## 2016-02-19 NOTE — Progress Notes (Signed)
Name: Billy Cisneros   MRN: [id number]    DOB: 01/27/1951   Date:02/19/2016       Progress Note  Subjective  Chief Complaint  Chief Complaint  Patient presents with  . Toe Pain    rt foot    HPI  Pt. Presents for evalaution of pain and swelling of the big toe of right foot, present for 2 weeks, gradually gotten worse and he presented to the ER. He was started on Clindamycin for cellulitis and asked to follow up with PCP. He reports today that after having taken antibiotics for a week, his big toe is still swollen and tender to touch. It is difficult to walk on   Past Medical History:  Diagnosis Date  . Anxiety   . GERD (gastroesophageal reflux disease)   . Gout   . Hypertension   . MI (myocardial infarction)     Past Surgical History:  Procedure Laterality Date  . CARDIAC SURGERY      History reviewed. No pertinent family history.  Social History   Social History  . Marital status: Married    Spouse name: N/A  . Number of children: N/A  . Years of education: N/A   Occupational History  . Not on file.   Social History Main Topics  . Smoking status: Never Smoker  . Smokeless tobacco: Never Used  . Alcohol use 0.0 oz/week  . Drug use:     Frequency: 2.0 times per week    Types: Cocaine  . Sexual activity: Yes    Birth control/ protection: None   Other Topics Concern  . Not on file   Social History Narrative  . No narrative on file     Current Outpatient Prescriptions:  .  ALPRAZolam (XANAX) 0.5 MG tablet, Take 1 tablet (0.5 mg total) by mouth 3 (three) times daily as needed for anxiety. for anxiety, Disp: 90 tablet, Rfl: 0 .  aspirin EC 81 MG tablet, Take 1 tablet (81 mg total) by mouth daily. For heart attack prevention., Disp: , Rfl:  .  buPROPion (WELLBUTRIN XL) 300 MG 24 hr tablet, Take 1 tablet (300 mg total) by mouth daily., Disp: 90 tablet, Rfl: 0 .  clotrimazole-betamethasone (LOTRISONE) cream, , Disp: , Rfl:  .  lisinopril (PRINIVIL,ZESTRIL)  10 MG tablet, Take 1 tablet (10 mg total) by mouth daily., Disp: 90 tablet, Rfl: 0 .  omeprazole (PRILOSEC) 20 MG capsule, Take 1 capsule (20 mg total) by mouth 2 (two) times daily before a meal., Disp: 180 capsule, Rfl: 0 .  simvastatin (ZOCOR) 40 MG tablet, Take 1 tablet (40 mg total) by mouth daily., Disp: 90 tablet, Rfl: 1 .  traMADol (ULTRAM) 50 MG tablet, Take 1 tablet (50 mg total) by mouth every 6 (six) hours as needed., Disp: 12 tablet, Rfl: 0 .  zolpidem (AMBIEN) 10 MG tablet, Take 1 tablet (10 mg total) by mouth at bedtime., Disp: 30 tablet, Rfl: 0 .  naproxen (NAPROSYN) 500 MG tablet, Take 1 tablet (500 mg total) by mouth 2 (two) times daily with a meal. (Patient not taking: Reported on 02/19/2016), Disp: 30 tablet, Rfl: 0 .  oxyCODONE (ROXICODONE) 5 MG immediate release tablet, Take 1 tablet (5 mg total) by mouth every 6 (six) hours as needed for moderate pain. Do not drive while taking this medication. (Patient not taking: Reported on 02/19/2016), Disp: 12 tablet, Rfl: 0  No Known Allergies   Review of Systems  Constitutional: Negative for chills and fever.  Musculoskeletal:  Positive for joint pain.    Objective  Vitals:   02/19/16 1517  BP: 125/68  Pulse: 95  Resp: 17  Temp: 97.7 F (36.5 C)  TempSrc: Oral  SpO2: 96%  Weight: 168 lb 12.8 oz (76.6 kg)  Height: 5\' 5"  (1.651 m)    Physical Exam  Constitutional: He is well-developed, well-nourished, and in no distress.  HENT:  Head: Normocephalic and atraumatic.  Musculoskeletal:       Right foot: There is decreased range of motion, tenderness and swelling.       Feet:  Intense tenderness to palpation over the right great toe, mild erythema, limited ROM due to pain,   Nursing note and vitals reviewed.    Assessment & Plan  1. Acute idiopathic gout involving toe of right foot Based on symptoms and presentation, this is most likely iron acute gout attack. Swelling and pain was not responsive to antimicrobial  therapy. We'll start on a tapering dose of prednisone, obtain uric acid level and renal function. We will follow up thereafter acute flare is resolved to consider urate lowering therapy  - predniSONE (STERAPRED UNI-PAK 21 TAB) 10 MG (21) TBPK tablet; Take 1 tablet (10 mg total) by mouth daily. 60 50 40 30 20 10  then STOP  Dispense: 21 tablet; Refill: 0 - Uric acid - COMPLETE METABOLIC PANEL WITH GFR   Billy Cisneros [facility] 02/19/2016 3:41 PM

## 2016-02-20 ENCOUNTER — Ambulatory Visit: Payer: Self-pay | Admitting: Podiatry

## 2016-02-25 ENCOUNTER — Telehealth: Payer: Self-pay | Admitting: Family Medicine

## 2016-02-25 DIAGNOSIS — G4700 Insomnia, unspecified: Secondary | ICD-10-CM

## 2016-02-25 MED ORDER — ZOLPIDEM TARTRATE 10 MG PO TABS: 10.0000 mg | ORAL_TABLET | Freq: Every day | ORAL | 0 refills | Status: DC

## 2016-02-25 NOTE — Telephone Encounter (Signed)
Rx printed and ready for pick up.  

## 2016-02-25 NOTE — Telephone Encounter (Signed)
Pt needs refill on on his Zolpidem.

## 2016-02-29 MED ORDER — FLUORESCEIN SODIUM 1 MG OP STRP
ORAL_STRIP | OPHTHALMIC | Status: AC
  Filled 2016-02-29: qty 2

## 2016-03-05 ENCOUNTER — Telehealth: Payer: Self-pay | Admitting: Family Medicine

## 2016-03-05 DIAGNOSIS — F419 Anxiety disorder, unspecified: Secondary | ICD-10-CM

## 2016-03-05 MED ORDER — ALPRAZOLAM 0.5 MG PO TABS: 0.5000 mg | ORAL_TABLET | Freq: Three times a day (TID) | ORAL | 0 refills | Status: DC | PRN

## 2016-03-05 NOTE — Telephone Encounter (Signed)
Prescription for Alprazolam is ready for pickup 

## 2016-03-05 NOTE — Telephone Encounter (Signed)
Pt needs refill on Alprazolam. Please advise. °

## 2016-03-06 ENCOUNTER — Telehealth: Payer: Self-pay

## 2016-03-06 DIAGNOSIS — F419 Anxiety disorder, unspecified: Secondary | ICD-10-CM

## 2016-03-06 MED ORDER — ALPRAZOLAM 0.5 MG PO TABS: 0.5000 mg | ORAL_TABLET | Freq: Three times a day (TID) | ORAL | 0 refills | Status: DC | PRN

## 2016-03-06 NOTE — Telephone Encounter (Signed)
Paper error prescription had to be reordered and reprinted, ready for patient pick up

## 2016-03-20 ENCOUNTER — Ambulatory Visit (INDEPENDENT_AMBULATORY_CARE_PROVIDER_SITE_OTHER): Payer: Self-pay | Admitting: Podiatry

## 2016-03-20 ENCOUNTER — Ambulatory Visit (INDEPENDENT_AMBULATORY_CARE_PROVIDER_SITE_OTHER): Payer: Self-pay

## 2016-03-20 ENCOUNTER — Encounter: Payer: Self-pay | Admitting: Podiatry

## 2016-03-20 ENCOUNTER — Ambulatory Visit: Payer: Self-pay

## 2016-03-20 VITALS — BP 112/80 | HR 94 | Resp 16 | Ht 65.0 in | Wt 168.0 lb

## 2016-03-20 DIAGNOSIS — M79672 Pain in left foot: Secondary | ICD-10-CM

## 2016-03-20 DIAGNOSIS — M1000 Idiopathic gout, unspecified site: Secondary | ICD-10-CM

## 2016-03-20 DIAGNOSIS — M79671 Pain in right foot: Secondary | ICD-10-CM

## 2016-03-20 DIAGNOSIS — R600 Localized edema: Secondary | ICD-10-CM

## 2016-03-20 DIAGNOSIS — M779 Enthesopathy, unspecified: Secondary | ICD-10-CM

## 2016-03-20 MED ORDER — TRIAMCINOLONE ACETONIDE 10 MG/ML IJ SUSP
10.0000 mg | Freq: Once | INTRAMUSCULAR | Status: AC
  Administered 2016-03-20: 10 mg

## 2016-03-20 MED ORDER — PREDNISONE 10 MG PO TABS: ORAL_TABLET | ORAL | 0 refills | Status: DC

## 2016-03-20 MED ORDER — HYDROCODONE-ACETAMINOPHEN 10-325 MG PO TABS: 1.0000 | ORAL_TABLET | Freq: Three times a day (TID) | ORAL | 0 refills | Status: DC | PRN

## 2016-03-20 NOTE — Progress Notes (Signed)
Subjective:     Patient ID: Billy Cisneros, male   DOB: 1950/10/26, 65 y.o.   MRN: [id number]  HPI patient states he has significant inflammation both feet which started around a month ago and has come for a while and then gone and seems to be coming more consistent over the last few days. Patient states the left ankle is the worse the right first big toe joint is also very bad and there is now swelling in the entire left foot which makes it hard to bear any form of weight on it. Has had a history of gout in the past but is not had any issues with it for 3 years   Review of Systems  All other systems reviewed and are negative.      Objective:   Physical Exam  Constitutional: He is oriented to person, place, and time.  Cardiovascular: Intact distal pulses.   Musculoskeletal: Normal range of motion.  Neurological: He is oriented to person, place, and time.  Skin: Skin is warm.  Nursing note and vitals reviewed.  neurovascular status was found to be intact with strong pulses with patient noted to have edema of the left forefoot and into the ankle lateral side with exquisite discomfort around the right first metatarsal phalangeal joint. Patient has negative Homans sign bilaterally was found have good digital perfusion and is well oriented 3     Assessment:     Appears to be some kind of inflammatory systemic disease with inflammatory tendinitis bilateral fluid buildup and possibility for gout    Plan:     H&P x-rays of both feet reviewed and today I did careful lateral injection left 3 mg Kenalog 5 g Xylocaine and medial injection right around the first MPJ and tendon complex 3 mg Kenalog 5 mill grams Xylocaine. Applied Unna boot left instructed on leaving it on 3 days or taken off earlier if she caused increased pain or any changes in color of the digits. Dispensed surgical shoe placed on Sterapred DS 12 day Dosepak to reduce inflammation and I'm sending him for full blood panel even  though 2 weeks ago uric acid was normal. We will evaluate this see him back 3 weeks or earlier if any issues should occur  X-ray report indicated there is no indications of significant bone pathology or advanced arthritis or stress fracture

## 2016-03-20 NOTE — Progress Notes (Signed)
   Subjective:    Patient ID: Billy Cisneros, male    DOB: 1950-11-17, 65 y.o.   MRN: [id number]  HPI Chief Complaint  Patient presents with  . Foot Pain    Bilateral; Right foot; great toe-joint, swelling; Right foot-swelling dorsal & plantar; pt stated, "Pain radiates from above the ankle down into the foot; Pain 10/10 all day long"; x2-3 weeks      Review of Systems  All other systems reviewed and are negative.      Objective:   Physical Exam        Assessment & Plan:

## 2016-03-21 LAB — ANA, IFA COMPREHENSIVE PANEL
Anti Nuclear Antibody(ANA): NEGATIVE
ENA SM Ab Ser-aCnc: <1
SM/RNP: <1
SSA (Ro) (ENA) Antibody, IgG: <1
SSB (La) (ENA) Antibody, IgG: <1
Scleroderma (Scl-70) (ENA) Antibody, IgG: <1
ds DNA Ab: 1 [IU]/mL

## 2016-03-21 LAB — URIC ACID: URIC ACID, SERUM: 6.8 mg/dL (ref 4.0–8.0)

## 2016-03-21 LAB — SEDIMENTATION RATE: Sed Rate: 19 mm/hr (ref 0–20)

## 2016-03-21 LAB — RHEUMATOID FACTOR: Rhuematoid fact SerPl-aCnc: <14 IU/mL (ref <14)

## 2016-03-21 LAB — C-REACTIVE PROTEIN: CRP: 35.9 mg/L — ABNORMAL HIGH

## 2016-04-07 ENCOUNTER — Ambulatory Visit: Payer: Self-pay | Admitting: Podiatry

## 2016-04-07 ENCOUNTER — Telehealth: Payer: Self-pay | Admitting: Family Medicine

## 2016-04-07 NOTE — Telephone Encounter (Signed)
Patient is requesting a refill on ambien.

## 2016-04-10 NOTE — Telephone Encounter (Signed)
Left a voicemail asking patient to call office and schedule a medication refill appointment

## 2016-04-11 ENCOUNTER — Telehealth: Payer: Self-pay | Admitting: Family Medicine

## 2016-04-11 DIAGNOSIS — G4700 Insomnia, unspecified: Secondary | ICD-10-CM

## 2016-04-11 DIAGNOSIS — F419 Anxiety disorder, unspecified: Secondary | ICD-10-CM

## 2016-04-11 NOTE — Telephone Encounter (Signed)
Patient had appointment scheduled for 04/28/16 however he had to reschedule due to you not being in the office. Patient did reschedule for 04/21/16 but is asking for a refill on Ambien and xanex.

## 2016-04-14 ENCOUNTER — Ambulatory Visit: Payer: Self-pay | Admitting: Podiatry

## 2016-04-14 MED ORDER — ZOLPIDEM TARTRATE 10 MG PO TABS: 10.0000 mg | ORAL_TABLET | Freq: Every day | ORAL | 2 refills | Status: DC

## 2016-04-14 MED ORDER — ALPRAZOLAM 0.5 MG PO TABS: 0.5000 mg | ORAL_TABLET | Freq: Three times a day (TID) | ORAL | 2 refills | Status: DC | PRN

## 2016-04-14 NOTE — Telephone Encounter (Signed)
Prescriptions for Ambien and Xanax are printed and ready for pickup

## 2016-04-15 NOTE — Telephone Encounter (Signed)
Voice message left for patient to pick up prescription

## 2016-04-21 ENCOUNTER — Ambulatory Visit: Payer: Self-pay | Admitting: Family Medicine

## 2016-04-28 ENCOUNTER — Ambulatory Visit: Payer: Self-pay | Admitting: Family Medicine

## 2016-05-08 ENCOUNTER — Encounter: Payer: Self-pay | Admitting: Family Medicine

## 2016-05-08 ENCOUNTER — Ambulatory Visit (INDEPENDENT_AMBULATORY_CARE_PROVIDER_SITE_OTHER): Payer: Self-pay | Admitting: Family Medicine

## 2016-05-08 VITALS — BP 123/76 | HR 96 | Temp 98.7°F | Resp 16 | Ht 65.0 in | Wt 173.2 lb

## 2016-05-08 DIAGNOSIS — F3342 Major depressive disorder, recurrent, in full remission: Secondary | ICD-10-CM

## 2016-05-08 DIAGNOSIS — I10 Essential (primary) hypertension: Secondary | ICD-10-CM

## 2016-05-08 DIAGNOSIS — K219 Gastro-esophageal reflux disease without esophagitis: Secondary | ICD-10-CM

## 2016-05-08 DIAGNOSIS — R21 Rash and other nonspecific skin eruption: Secondary | ICD-10-CM

## 2016-05-08 DIAGNOSIS — E785 Hyperlipidemia, unspecified: Secondary | ICD-10-CM

## 2016-05-08 MED ORDER — SIMVASTATIN 40 MG PO TABS: 40.0000 mg | ORAL_TABLET | Freq: Every day | ORAL | 1 refills | Status: DC

## 2016-05-08 MED ORDER — OMEPRAZOLE 20 MG PO CPDR: 20.0000 mg | DELAYED_RELEASE_CAPSULE | Freq: Two times a day (BID) | ORAL | 0 refills | Status: DC

## 2016-05-08 MED ORDER — BUPROPION HCL ER (XL) 300 MG PO TB24: 300.0000 mg | ORAL_TABLET | Freq: Every day | ORAL | 0 refills | Status: DC

## 2016-05-08 MED ORDER — LISINOPRIL 10 MG PO TABS: 10.0000 mg | ORAL_TABLET | Freq: Every day | ORAL | 0 refills | Status: DC

## 2016-05-08 MED ORDER — CLOTRIMAZOLE-BETAMETHASONE 1-0.05 % EX CREA: TOPICAL_CREAM | Freq: Two times a day (BID) | CUTANEOUS | 0 refills | Status: DC

## 2016-05-08 NOTE — Progress Notes (Signed)
Name: Billy AmassKenneth H Borsuk   MRN: [id number]    DOB: December 15, 1950   Date:05/08/2016       Progress Note  Subjective  Chief Complaint  Chief Complaint  Patient presents with  . Follow-up    3 mo  . Medication Refill    Hyperlipidemia  This is a chronic problem. The problem is controlled. Recent lipid tests were reviewed and are normal. Pertinent negatives include no chest pain, leg pain, myalgias or shortness of breath. Current antihyperlipidemic treatment includes statins.  Hypertension  This is a chronic problem. The problem is unchanged. Pertinent negatives include no blurred vision, chest pain, headaches, palpitations or shortness of breath. Past treatments include ACE inhibitors. There is no history of kidney disease, CAD/MI or CVA.  Depression         This is a chronic problem.  The onset quality is gradual.   The problem has been gradually improving since onset.  Associated symptoms include no fatigue, no helplessness, no hopelessness, no decreased interest, no appetite change, no myalgias and no headaches.     Past Medical History:  Diagnosis Date  . Anxiety   . GERD (gastroesophageal reflux disease)   . Gout   . Hypertension   . MI (myocardial infarction)     Past Surgical History:  Procedure Laterality Date  . CARDIAC SURGERY      History reviewed. No pertinent family history.  Social History   Social History  . Marital status: Married    Spouse name: N/A  . Number of children: N/A  . Years of education: N/A   Occupational History  . Not on file.   Social History Main Topics  . Smoking status: Never Smoker  . Smokeless tobacco: Never Used  . Alcohol use 0.0 oz/week  . Drug use:     Frequency: 2.0 times per week    Types: Cocaine  . Sexual activity: Yes    Birth control/ protection: None   Other Topics Concern  . Not on file   Social History Narrative  . No narrative on file     Current Outpatient Prescriptions:  .  ALPRAZolam (XANAX) 0.5 MG  tablet, Take 1 tablet (0.5 mg total) by mouth 3 (three) times daily as needed for anxiety. for anxiety, Disp: 90 tablet, Rfl: 2 .  aspirin EC 81 MG tablet, Take 1 tablet (81 mg total) by mouth daily. For heart attack prevention., Disp: , Rfl:  .  buPROPion (WELLBUTRIN XL) 300 MG 24 hr tablet, Take 1 tablet (300 mg total) by mouth daily., Disp: 90 tablet, Rfl: 0 .  clotrimazole-betamethasone (LOTRISONE) cream, , Disp: , Rfl:  .  lisinopril (PRINIVIL,ZESTRIL) 10 MG tablet, Take 1 tablet (10 mg total) by mouth daily., Disp: 90 tablet, Rfl: 0 .  omeprazole (PRILOSEC) 20 MG capsule, Take 1 capsule (20 mg total) by mouth 2 (two) times daily before a meal., Disp: 180 capsule, Rfl: 0 .  simvastatin (ZOCOR) 40 MG tablet, Take 1 tablet (40 mg total) by mouth daily., Disp: 90 tablet, Rfl: 1 .  zolpidem (AMBIEN) 10 MG tablet, Take 1 tablet (10 mg total) by mouth at bedtime., Disp: 30 tablet, Rfl: 2 .  HYDROcodone-acetaminophen (NORCO) 10-325 MG tablet, Take 1 tablet by mouth every 8 (eight) hours as needed. (Patient not taking: Reported on 05/08/2016), Disp: 30 tablet, Rfl: 0 .  naproxen (NAPROSYN) 500 MG tablet, Take 1 tablet (500 mg total) by mouth 2 (two) times daily with a meal. (Patient not taking: Reported on 05/08/2016),  Disp: 30 tablet, Rfl: 0 .  predniSONE (DELTASONE) 10 MG tablet, 12 day tapering dose (Patient not taking: Reported on 05/08/2016), Disp: 48 tablet, Rfl: 0 .  predniSONE (STERAPRED UNI-PAK 21 TAB) 10 MG (21) TBPK tablet, Take 1 tablet (10 mg total) by mouth daily. 60 50 40 30 20 10  then STOP (Patient not taking: Reported on 05/08/2016), Disp: 21 tablet, Rfl: 0 .  traMADol (ULTRAM) 50 MG tablet, Take 1 tablet (50 mg total) by mouth every 6 (six) hours as needed. (Patient not taking: Reported on 05/08/2016), Disp: 12 tablet, Rfl: 0  No Known Allergies   Review of Systems  Constitutional: Negative for appetite change and fatigue.  Eyes: Negative for blurred vision.  Respiratory: Negative  for shortness of breath.   Cardiovascular: Negative for chest pain and palpitations.  Musculoskeletal: Negative for myalgias.  Neurological: Negative for headaches.  Psychiatric/Behavioral: Positive for depression.     Objective  Vitals:   05/08/16 1459  BP: 123/76  Pulse: 96  Resp: 16  Temp: 98.7 F (37.1 C)  TempSrc: Oral  SpO2: 96%  Weight: 173 lb 3.2 oz (78.6 kg)  Height: 5\' 5"  (1.651 m)    Physical Exam  Constitutional: He is oriented to person, place, and time and well-developed, well-nourished, and in no distress.  HENT:  Head: Normocephalic and atraumatic.  Cardiovascular: Normal rate, regular rhythm and normal heart sounds.   No murmur heard. Pulmonary/Chest: Effort normal and breath sounds normal. He has no wheezes.  Abdominal: Soft. Bowel sounds are normal. There is no tenderness.  Musculoskeletal: He exhibits no edema.  Neurological: He is alert and oriented to person, place, and time.  Psychiatric: Mood, memory, affect and judgment normal.  Nursing note and vitals reviewed.     Assessment & Plan  1. Dyslipidemia  - simvastatin (ZOCOR) 40 MG tablet; Take 1 tablet (40 mg total) by mouth daily.  Dispense: 90 tablet; Refill: 1  2. Essential hypertension  - lisinopril (PRINIVIL,ZESTRIL) 10 MG tablet; Take 1 tablet (10 mg total) by mouth daily.  Dispense: 90 tablet; Refill: 0  3. Gastroesophageal reflux disease, esophagitis presence not specified  - omeprazole (PRILOSEC) 20 MG capsule; Take 1 capsule (20 mg total) by mouth 2 (two) times daily before a meal.  Dispense: 180 capsule; Refill: 0  4. Recurrent major depressive disorder, in full remission (HCC)  - buPROPion (WELLBUTRIN XL) 300 MG 24 hr tablet; Take 1 tablet (300 mg total) by mouth daily.  Dispense: 90 tablet; Refill: 0  5. Skin rash  - clotrimazole-betamethasone (LOTRISONE) cream; Apply topically 2 (two) times daily.  Dispense: 45 g; Refill: 0   Tarik Teixeira Asad A. Faylene KurtzShah [facility] 05/08/2016 3:14 PM

## 2016-05-19 ENCOUNTER — Ambulatory Visit: Payer: Self-pay | Admitting: Family Medicine

## 2016-06-23 ENCOUNTER — Encounter: Payer: Self-pay | Admitting: *Deleted

## 2016-06-23 ENCOUNTER — Emergency Department
Admission: EM | Admit: 2016-06-23 | Discharge: 2016-06-23 | Disposition: A | Discharge status: Home / Self Care | Payer: BLUE CROSS/BLUE SHIELD | Attending: Emergency Medicine | Admitting: Emergency Medicine

## 2016-06-23 DIAGNOSIS — Z79899 Other long term (current) drug therapy: Secondary | ICD-10-CM | POA: Insufficient documentation

## 2016-06-23 DIAGNOSIS — Y929 Unspecified place or not applicable: Secondary | ICD-10-CM | POA: Insufficient documentation

## 2016-06-23 DIAGNOSIS — Z951 Presence of aortocoronary bypass graft: Secondary | ICD-10-CM | POA: Insufficient documentation

## 2016-06-23 DIAGNOSIS — Y939 Activity, unspecified: Secondary | ICD-10-CM | POA: Insufficient documentation

## 2016-06-23 DIAGNOSIS — E785 Hyperlipidemia, unspecified: Secondary | ICD-10-CM | POA: Insufficient documentation

## 2016-06-23 DIAGNOSIS — Y999 Unspecified external cause status: Secondary | ICD-10-CM | POA: Insufficient documentation

## 2016-06-23 DIAGNOSIS — S61211A Laceration without foreign body of left index finger without damage to nail, initial encounter: Secondary | ICD-10-CM | POA: Insufficient documentation

## 2016-06-23 DIAGNOSIS — I252 Old myocardial infarction: Secondary | ICD-10-CM | POA: Insufficient documentation

## 2016-06-23 DIAGNOSIS — Z23 Encounter for immunization: Secondary | ICD-10-CM | POA: Insufficient documentation

## 2016-06-23 DIAGNOSIS — Z7982 Long term (current) use of aspirin: Secondary | ICD-10-CM | POA: Insufficient documentation

## 2016-06-23 DIAGNOSIS — W268XXA Contact with other sharp object(s), not elsewhere classified, initial encounter: Secondary | ICD-10-CM | POA: Insufficient documentation

## 2016-06-23 DIAGNOSIS — F418 Other specified anxiety disorders: Secondary | ICD-10-CM | POA: Insufficient documentation

## 2016-06-23 DIAGNOSIS — I10 Essential (primary) hypertension: Secondary | ICD-10-CM | POA: Insufficient documentation

## 2016-06-23 MED ORDER — SULFAMETHOXAZOLE-TRIMETHOPRIM 800-160 MG PO TABS: 1.0000 | ORAL_TABLET | Freq: Two times a day (BID) | ORAL | 0 refills | Status: DC

## 2016-06-23 MED ORDER — TETANUS-DIPHTHERIA TOXOIDS TD 5-2 LFU IM INJ
0.5000 mL | INJECTION | Freq: Once | INTRAMUSCULAR | Status: AC
  Administered 2016-06-23: 0.5 mL via INTRAMUSCULAR
  Filled 2016-06-23: qty 0.5

## 2016-06-23 NOTE — ED Triage Notes (Signed)
Laceration to left pointer finger on car bolt on Sunday. Bleeding controlled at this time. Pt can move finger.

## 2016-06-23 NOTE — Discharge Instructions (Signed)
Follow discharge care instruction take antibiotics as directed.

## 2016-06-23 NOTE — ED Notes (Signed)
Finger #2 left hand with lac from tip to past nail bed. Not bleeding. Full rom. Finger warm.

## 2016-06-23 NOTE — ED Provider Notes (Signed)
[facility] Emergency Department Provider Note   ____________________________________________   None    (approximate)  I have reviewed the triage vital signs and the nursing notes.   HISTORY  Chief Complaint Finger Injury    HPI Billy Cisneros is a 66 y.o. male patient presents with a laceration to the left index finger greater than 24 hours. Patient stated some metal cut on a bolt on the knee for car. Bleeding controlled with direct pressure. Patient denies loss of sensation or loss of motion of the finger. Patient tetanus shot is not up-to-date. Except for pressure dressing no other palliative measures for this complaint. She rates his pain as a 4/10. Patient described a pain as "achy".   Past Medical History:  Diagnosis Date  . Anxiety   . GERD (gastroesophageal reflux disease)   . Gout   . Hypertension   . MI (myocardial infarction)     Patient Active Problem List   Diagnosis Date Noted  . Acute gout 02/19/2016  . GERD (gastroesophageal reflux disease) 01/21/2016  . Pain and swelling of right wrist 08/13/2015  . Productive cough 08/13/2015  . Chronic right shoulder pain 06/12/2015  . Cannot sleep 06/12/2015  . Dyslipidemia 03/12/2015  . Cervical radiculopathy 01/08/2015  . Annual physical exam 01/01/2015  . H/O coronary artery bypass surgery 12/11/2014  . Hypertension 12/04/2014  . Anxiety 12/04/2014  . Chest pain 12/04/2014  . Major depression 11/21/2011    Past Surgical History:  Procedure Laterality Date  . CARDIAC SURGERY      Prior to Admission medications   Medication Sig Start Date End Date Taking? Authorizing Provider  ALPRAZolam Prudy Feeler) 0.5 MG tablet Take 1 tablet (0.5 mg total) by mouth 3 (three) times daily as needed for anxiety. for anxiety 04/14/16   Ellyn Hack, MD  aspirin EC 81 MG tablet Take 1 tablet (81 mg total) by mouth daily. For heart attack prevention. 11/22/11   Tamala Julian, PA-C  buPROPion  (WELLBUTRIN XL) 300 MG 24 hr tablet Take 1 tablet (300 mg total) by mouth daily. 05/08/16   Ellyn Hack, MD  clotrimazole-betamethasone (LOTRISONE) cream Apply topically 2 (two) times daily. 05/08/16   Ellyn Hack, MD  lisinopril (PRINIVIL,ZESTRIL) 10 MG tablet Take 1 tablet (10 mg total) by mouth daily. 05/08/16   Ellyn Hack, MD  omeprazole (PRILOSEC) 20 MG capsule Take 1 capsule (20 mg total) by mouth 2 (two) times daily before a meal. 05/08/16   Ellyn Hack, MD  predniSONE (STERAPRED UNI-PAK 21 TAB) 10 MG (21) TBPK tablet Take 1 tablet (10 mg total) by mouth daily. 60 50 40 30 20 10  then STOP Patient not taking: Reported on 05/08/2016 02/19/16   Ellyn Hack, MD  simvastatin (ZOCOR) 40 MG tablet Take 1 tablet (40 mg total) by mouth daily. 05/08/16   Ellyn Hack, MD  sulfamethoxazole-trimethoprim (BACTRIM DS,SEPTRA DS) 800-160 MG tablet Take 1 tablet by mouth 2 (two) times daily. 06/23/16   Joni Reining, PA-C  zolpidem (AMBIEN) 10 MG tablet Take 1 tablet (10 mg total) by mouth at bedtime. 04/14/16   Ellyn Hack, MD    Allergies Patient has no known allergies.  History reviewed. No pertinent family history.  Social History Social History  Substance Use Topics  . Smoking status: Never Smoker  . Smokeless tobacco: Never Used  . Alcohol use 0.0 oz/week    Review of Systems  Constitutional: No fever/chills Eyes: No visual changes. ENT: No sore throat. Cardiovascular: Denies chest pain. Respiratory: Denies shortness of breath. Gastrointestinal: No abdominal pain.  No nausea, no vomiting.  No diarrhea.  No constipation. Genitourinary: Negative for dysuria. Musculoskeletal: Negative for back pain. Skin: Negative for rash. Left finger laceration Neurological: Negative for headaches, focal weakness or numbness. Psychiatric:Anxiety and depression. Endocrine:Hypertension and hyperlipidemia.  ____________________________________________   PHYSICAL  EXAM:  VITAL SIGNS: ED Triage Vitals  Enc Vitals Group     BP 06/23/16 1743 123/86     Pulse Rate 06/23/16 1743 81     Resp 06/23/16 1743 16     Temp 06/23/16 1743 97.7 F (36.5 C)     Temp src --      SpO2 06/23/16 1743 95 %     Weight 06/23/16 1743 173 lb (78.5 kg)     Height 06/23/16 1743 5\' 5"  (1.651 m)     Head Circumference --      Peak Flow --      Pain Score 06/23/16 1744 4     Pain Loc --      Pain Edu? --      Excl. in GC? --     Constitutional: Alert and oriented. Well appearing and in no acute distress. Eyes: Conjunctivae are normal. PERRL. EOMI. Head: Atraumatic. Nose: No congestion/rhinnorhea. Mouth/Throat: Mucous membranes are moist.  Oropharynx non-erythematous. Neck: No stridor.  No cervical spine tenderness to palpation. Hematological/Lymphatic/Immunilogical: No cervical lymphadenopathy. Cardiovascular: Normal rate, regular rhythm. Grossly normal heart sounds.  Good peripheral circulation. Respiratory: Normal respiratory effort.  No retractions. Lungs CTAB. Gastrointestinal: Soft and nontender. No distention. No abdominal bruits. No CVA tenderness. Musculoskeletal: No lower extremity tenderness nor edema.  No joint effusions. Neurologic:  Normal speech and language. No gross focal neurologic deficits are appreciated. No gait instability. Skin:  Laceration distal phalange secondary digit left hand.. No rash noted. Psychiatric: Mood and affect are normal. Speech and behavior are normal.  ____________________________________________   LABS (all labs ordered are listed, but only abnormal results are displayed)  Labs Reviewed - No data to display ____________________________________________  EKG   ____________________________________________  RADIOLOGY   ____________________________________________   PROCEDURES  Procedure(s) performed: None  Procedures  Critical Care performed: No  ____________________________________________   INITIAL  IMPRESSION / ASSESSMENT AND PLAN / ED COURSE  Pertinent labs & imaging results that were available during my care of the patient were reviewed by me and considered in my medical decision making (see chart for details).  Old laceration second digit left hand. Patient given discharge care instructions. Patient given a prescription for Bactrim DS.      ____________________________________________   FINAL CLINICAL IMPRESSION(S) / ED DIAGNOSES Patient wound  clean and Steri-Strips applied. Patient given a tetanus shot. Final diagnoses:  Laceration of left index finger without damage to nail, foreign body presence unspecified, initial encounter      NEW MEDICATIONS STARTED DURING THIS VISIT:  New Prescriptions   SULFAMETHOXAZOLE-TRIMETHOPRIM (BACTRIM DS,SEPTRA DS) 800-160 MG TABLET    Take 1 tablet by mouth 2 (two) times daily.     Note:  This document was prepared using Dragon voice recognition software and may include unintentional dictation errors.    Joni Reiningonald K Smith, PA-C 06/23/16 1828    Loleta Roseory Forbach, MD 06/23/16 2021

## 2016-07-15 ENCOUNTER — Telehealth: Payer: Self-pay | Admitting: Family Medicine

## 2016-07-15 DIAGNOSIS — G4700 Insomnia, unspecified: Secondary | ICD-10-CM

## 2016-07-15 MED ORDER — ZOLPIDEM TARTRATE 10 MG PO TABS: 10.0000 mg | ORAL_TABLET | Freq: Every day | ORAL | 2 refills | Status: DC

## 2016-07-15 NOTE — Telephone Encounter (Signed)
Pt has his 3 mth fu appt scheduled for 08/04/16

## 2016-07-15 NOTE — Telephone Encounter (Signed)
Please schedule patient for an appointment for medication refills. 

## 2016-07-15 NOTE — Telephone Encounter (Signed)
Prescription for Ambien is printed and is ready for pickup 

## 2016-07-15 NOTE — Telephone Encounter (Signed)
Pt needs refill on Ambien

## 2016-07-16 NOTE — Telephone Encounter (Signed)
LMOM to inform pt about RX °

## 2016-08-04 ENCOUNTER — Ambulatory Visit: Payer: Self-pay | Admitting: Family Medicine

## 2016-08-07 ENCOUNTER — Encounter: Payer: Self-pay | Admitting: Family Medicine

## 2016-08-07 ENCOUNTER — Ambulatory Visit (INDEPENDENT_AMBULATORY_CARE_PROVIDER_SITE_OTHER): Payer: Self-pay | Admitting: Family Medicine

## 2016-08-07 VITALS — BP 120/79 | HR 80 | Temp 97.4°F | Resp 17 | Ht 65.0 in | Wt 172.4 lb

## 2016-08-07 DIAGNOSIS — I10 Essential (primary) hypertension: Secondary | ICD-10-CM

## 2016-08-07 DIAGNOSIS — F419 Anxiety disorder, unspecified: Secondary | ICD-10-CM

## 2016-08-07 DIAGNOSIS — F3342 Major depressive disorder, recurrent, in full remission: Secondary | ICD-10-CM

## 2016-08-07 DIAGNOSIS — Z833 Family history of diabetes mellitus: Secondary | ICD-10-CM

## 2016-08-07 DIAGNOSIS — E785 Hyperlipidemia, unspecified: Secondary | ICD-10-CM

## 2016-08-07 LAB — POCT GLYCOSYLATED HEMOGLOBIN (HGB A1C): Hemoglobin A1C: 5.3

## 2016-08-07 MED ORDER — ALPRAZOLAM 0.5 MG PO TABS: 0.5000 mg | ORAL_TABLET | Freq: Three times a day (TID) | ORAL | 2 refills | Status: DC | PRN

## 2016-08-07 MED ORDER — SIMVASTATIN 40 MG PO TABS: 40.0000 mg | ORAL_TABLET | Freq: Every day | ORAL | 1 refills | Status: DC

## 2016-08-07 MED ORDER — LISINOPRIL 10 MG PO TABS: 10.0000 mg | ORAL_TABLET | Freq: Every day | ORAL | 0 refills | Status: DC

## 2016-08-07 MED ORDER — BUPROPION HCL ER (XL) 300 MG PO TB24: 300.0000 mg | ORAL_TABLET | Freq: Every day | ORAL | 0 refills | Status: DC

## 2016-08-07 NOTE — Progress Notes (Signed)
Name: Billy Cisneros   MRN: [id number]    DOB: January 15, 1951   Date:08/07/2016       Progress Note  Subjective  Chief Complaint  Chief Complaint  Patient presents with  . Follow-up    3 mo  . Medication Refill    Hyperlipidemia  This is a chronic problem. The problem is controlled. Recent lipid tests were reviewed and are normal. Pertinent negatives include no chest pain, leg pain, myalgias or shortness of breath. Current antihyperlipidemic treatment includes statins. Risk factors: had CAD and CABGX5 in 2000.  Hypertension  This is a chronic problem. The problem is unchanged. The problem is controlled. Pertinent negatives include no anxiety (has anxiety, controlled with Xanax.), blurred vision, chest pain, headaches, palpitations or shortness of breath. Past treatments include ACE inhibitors. Hypertensive end-organ damage includes CAD/MI. There is no history of kidney disease or CVA.  Anxiety  Presents for follow-up visit. Symptoms include depressed mood, excessive worry, insomnia, irritability and nervous/anxious behavior. Patient reports no chest pain, palpitations, panic or shortness of breath. The severity of symptoms is moderate and causing significant distress. The quality of sleep is fair.      Past Medical History:  Diagnosis Date  . Anxiety   . GERD (gastroesophageal reflux disease)   . Gout   . Hypertension   . MI (myocardial infarction)     Past Surgical History:  Procedure Laterality Date  . CARDIAC SURGERY      History reviewed. No pertinent family history.  Social History   Social History  . Marital status: Married    Spouse name: N/A  . Number of children: N/A  . Years of education: N/A   Occupational History  . Not on file.   Social History Main Topics  . Smoking status: Never Smoker  . Smokeless tobacco: Never Used  . Alcohol use 0.0 oz/week  . Drug use: Yes    Frequency: 2.0 times per week    Types: Cocaine  . Sexual activity: Yes    Birth  control/ protection: None   Other Topics Concern  . Not on file   Social History Narrative  . No narrative on file     Current Outpatient Prescriptions:  .  ALPRAZolam (XANAX) 0.5 MG tablet, Take 1 tablet (0.5 mg total) by mouth 3 (three) times daily as needed for anxiety. for anxiety, Disp: 90 tablet, Rfl: 2 .  aspirin EC 81 MG tablet, Take 1 tablet (81 mg total) by mouth daily. For heart attack prevention., Disp: , Rfl:  .  buPROPion (WELLBUTRIN XL) 300 MG 24 hr tablet, Take 1 tablet (300 mg total) by mouth daily., Disp: 90 tablet, Rfl: 0 .  clotrimazole-betamethasone (LOTRISONE) cream, Apply topically 2 (two) times daily., Disp: 45 g, Rfl: 0 .  lisinopril (PRINIVIL,ZESTRIL) 10 MG tablet, Take 1 tablet (10 mg total) by mouth daily., Disp: 90 tablet, Rfl: 0 .  omeprazole (PRILOSEC) 20 MG capsule, Take 1 capsule (20 mg total) by mouth 2 (two) times daily before a meal., Disp: 180 capsule, Rfl: 0 .  simvastatin (ZOCOR) 40 MG tablet, Take 1 tablet (40 mg total) by mouth daily., Disp: 90 tablet, Rfl: 1 .  sulfamethoxazole-trimethoprim (BACTRIM DS,SEPTRA DS) 800-160 MG tablet, Take 1 tablet by mouth 2 (two) times daily., Disp: 20 tablet, Rfl: 0 .  zolpidem (AMBIEN) 10 MG tablet, Take 1 tablet (10 mg total) by mouth at bedtime., Disp: 30 tablet, Rfl: 2 .  predniSONE (STERAPRED UNI-PAK 21 TAB) 10 MG (21) TBPK tablet,  Take 1 tablet (10 mg total) by mouth daily. 60 50 40 30 20 10  then STOP (Patient not taking: Reported on 05/08/2016), Disp: 21 tablet, Rfl: 0  No Known Allergies   Review of Systems  Constitutional: Positive for irritability.  Eyes: Negative for blurred vision.  Respiratory: Negative for shortness of breath.   Cardiovascular: Negative for chest pain and palpitations.  Musculoskeletal: Negative for myalgias.  Neurological: Negative for headaches.  Psychiatric/Behavioral: The patient is nervous/anxious and has insomnia.     Objective  Vitals:   08/07/16 0930  BP: 120/79   Pulse: 80  Resp: 17  Temp: 97.4 F (36.3 C)  TempSrc: Oral  SpO2: 95%  Weight: 172 lb 6.4 oz (78.2 kg)  Height: 5\' 5"  (1.651 m)    Physical Exam  Constitutional: He is oriented to person, place, and time and well-developed, well-nourished, and in no distress.  HENT:  Head: Normocephalic and atraumatic.  Cardiovascular: Normal rate, regular rhythm and normal heart sounds.   No murmur heard. Pulmonary/Chest: Effort normal and breath sounds normal. He has no wheezes.  Abdominal: Soft. Bowel sounds are normal. There is no tenderness.  Musculoskeletal: He exhibits no edema.       Right ankle: He exhibits no swelling. No tenderness.       Left ankle: He exhibits no swelling. No tenderness.  Neurological: He is alert and oriented to person, place, and time.  Psychiatric: Mood, memory, affect and judgment normal.  Nursing note and vitals reviewed.     Assessment & Plan  1. Anxiety Stable, responsive to alprazolam taken up to 3 times daily when needed - ALPRAZolam (XANAX) 0.5 MG tablet; Take 1 tablet (0.5 mg total) by mouth 3 (three) times daily as needed for anxiety. for anxiety  Dispense: 90 tablet; Refill: 2  2. Dyslipidemia FLP and adjust statin as appropriate - simvastatin (ZOCOR) 40 MG tablet; Take 1 tablet (40 mg total) by mouth daily.  Dispense: 90 tablet; Refill: 1 - Lipid panel - COMPLETE METABOLIC PANEL WITH GFR  3. Essential hypertension Stable on present antihypertensive therapy - lisinopril (PRINIVIL,ZESTRIL) 10 MG tablet; Take 1 tablet (10 mg total) by mouth daily.  Dispense: 90 tablet; Refill: 0  4. Recurrent major depressive disorder, in full remission (HCC)  - buPROPion (WELLBUTRIN XL) 300 MG 24 hr tablet; Take 1 tablet (300 mg total) by mouth daily.  Dispense: 90 tablet; Refill: 0  5. Family history of diabetes mellitus in brother Point-of-care A1c is 5.3%, well-controlled diabetes - POCT glycosylated hemoglobin (Hb A1C)   Caterina Racine Asad A.  Faylene KurtzShah [facility] 08/07/2016 10:19 AM

## 2016-10-09 ENCOUNTER — Other Ambulatory Visit: Payer: Self-pay | Admitting: Family Medicine

## 2016-10-09 DIAGNOSIS — F419 Anxiety disorder, unspecified: Secondary | ICD-10-CM

## 2016-10-14 ENCOUNTER — Telehealth: Payer: Self-pay | Admitting: Family Medicine

## 2016-10-14 NOTE — Telephone Encounter (Signed)
Pt requesting refill on Ambien °

## 2016-10-15 ENCOUNTER — Other Ambulatory Visit: Payer: Self-pay | Admitting: Emergency Medicine

## 2016-10-15 DIAGNOSIS — G4700 Insomnia, unspecified: Secondary | ICD-10-CM

## 2016-10-15 MED ORDER — ZOLPIDEM TARTRATE 10 MG PO TABS: 10.0000 mg | ORAL_TABLET | Freq: Every day | ORAL | 2 refills | Status: DC

## 2016-10-15 NOTE — Telephone Encounter (Signed)
LMOM to inform pt °

## 2016-10-15 NOTE — Telephone Encounter (Signed)
Script ready for pick up 

## 2016-11-10 ENCOUNTER — Ambulatory Visit: Payer: Self-pay | Admitting: Family Medicine

## 2016-11-20 ENCOUNTER — Other Ambulatory Visit: Payer: Self-pay | Admitting: Family Medicine

## 2016-11-20 LAB — COMPLETE METABOLIC PANEL WITH GFR
ALBUMIN: 4.5 g/dL (ref 3.6–5.1)
ALK PHOS: 92 U/L (ref 40–115)
ALT: 19 U/L (ref 9–46)
AST: 16 U/L (ref 10–35)
BILIRUBIN TOTAL: 0.7 mg/dL (ref 0.2–1.2)
BUN: 17 mg/dL (ref 7–25)
CO2: 28 mmol/L (ref 20–31)
CREATININE: 1.73 mg/dL — AB (ref 0.70–1.25)
Calcium: 9.4 mg/dL (ref 8.6–10.3)
Chloride: 104 mmol/L (ref 98–110)
GFR, EST NON AFRICAN AMERICAN: 41 mL/min — AB (ref >=60)
GFR, Est African American: 47 mL/min — ABNORMAL LOW (ref >=60)
GLUCOSE: 111 mg/dL — AB (ref 65–99)
Potassium: 4.7 mmol/L (ref 3.5–5.3)
SODIUM: 140 mmol/L (ref 135–146)
TOTAL PROTEIN: 6.6 g/dL (ref 6.1–8.1)

## 2016-11-20 LAB — LIPID PANEL
Cholesterol: 157 mg/dL (ref <200)
HDL: 35 mg/dL — ABNORMAL LOW (ref >40)
LDL CALC: 92 mg/dL (ref <100)
Total CHOL/HDL Ratio: 4.5 Ratio (ref <5.0)
Triglycerides: 149 mg/dL (ref <150)
VLDL: 30 mg/dL (ref <30)

## 2016-11-24 ENCOUNTER — Ambulatory Visit (INDEPENDENT_AMBULATORY_CARE_PROVIDER_SITE_OTHER): Payer: Self-pay | Admitting: Family Medicine

## 2016-11-24 ENCOUNTER — Encounter: Payer: Self-pay | Admitting: Family Medicine

## 2016-11-24 VITALS — BP 120/76 | HR 86 | Temp 97.8°F | Resp 16 | Ht 65.0 in | Wt 171.2 lb

## 2016-11-24 DIAGNOSIS — K219 Gastro-esophageal reflux disease without esophagitis: Secondary | ICD-10-CM

## 2016-11-24 DIAGNOSIS — I10 Essential (primary) hypertension: Secondary | ICD-10-CM

## 2016-11-24 DIAGNOSIS — F419 Anxiety disorder, unspecified: Secondary | ICD-10-CM

## 2016-11-24 DIAGNOSIS — E785 Hyperlipidemia, unspecified: Secondary | ICD-10-CM

## 2016-11-24 DIAGNOSIS — N489 Disorder of penis, unspecified: Secondary | ICD-10-CM

## 2016-11-24 DIAGNOSIS — F3342 Major depressive disorder, recurrent, in full remission: Secondary | ICD-10-CM

## 2016-11-24 MED ORDER — ALPRAZOLAM 0.5 MG PO TABS: ORAL_TABLET | ORAL | 2 refills | Status: DC

## 2016-11-24 MED ORDER — SIMVASTATIN 40 MG PO TABS: 40.0000 mg | ORAL_TABLET | Freq: Every day | ORAL | 1 refills | Status: DC

## 2016-11-24 MED ORDER — LISINOPRIL 10 MG PO TABS: 10.0000 mg | ORAL_TABLET | Freq: Every day | ORAL | 0 refills | Status: DC

## 2016-11-24 MED ORDER — VALACYCLOVIR HCL 1 G PO TABS: 1000.0000 mg | ORAL_TABLET | Freq: Every day | ORAL | 0 refills | Status: AC

## 2016-11-24 MED ORDER — BUPROPION HCL ER (XL) 300 MG PO TB24: 300.0000 mg | ORAL_TABLET | Freq: Every day | ORAL | 0 refills | Status: DC

## 2016-11-24 MED ORDER — OMEPRAZOLE 20 MG PO CPDR: 20.0000 mg | DELAYED_RELEASE_CAPSULE | Freq: Two times a day (BID) | ORAL | 0 refills | Status: DC

## 2016-11-24 NOTE — Progress Notes (Signed)
Name: Billy Cisneros   MRN: [id number]    DOB: 1950/10/30   Date:11/24/2016       Progress Note  Subjective  Chief Complaint  Chief Complaint  Patient presents with  . Follow-up    3 mo  . Medication Refill    Hyperlipidemia  This is a chronic problem. The problem is controlled. Recent lipid tests were reviewed and are normal. Pertinent negatives include no chest pain, leg pain, myalgias or shortness of breath. Current antihyperlipidemic treatment includes statins.  Hypertension  This is a chronic problem. The problem is unchanged. Associated symptoms include anxiety. Pertinent negatives include no blurred vision, chest pain, headaches, palpitations or shortness of breath. Past treatments include ACE inhibitors. There is no history of kidney disease, CAD/MI or CVA.  Depression         This is a chronic problem.  The onset quality is gradual.   The problem has been gradually improving since onset.  Associated symptoms include no fatigue, no helplessness, no hopelessness, does not have insomnia, no decreased interest, no appetite change, no myalgias and no headaches.  Past treatments include SSRIs - Selective serotonin reuptake inhibitors.  Past medical history includes anxiety.   Anxiety  Presents for follow-up visit. Patient reports no chest pain, depressed mood, excessive worry, insomnia, nervous/anxious behavior, palpitations or shortness of breath. The severity of symptoms is moderate.    Rash  This is a recurrent problem. The current episode started 1 to 4 weeks ago (2 weeks). The affected locations include the genitalia. The rash is characterized by itchiness. Pertinent negatives include no fatigue or shortness of breath. Past treatments include topical steroids. The treatment provided mild relief.     Past Medical History:  Diagnosis Date  . Anxiety   . GERD (gastroesophageal reflux disease)   . Gout   . Hypertension   . MI (myocardial infarction) [facility])     Past Surgical  History:  Procedure Laterality Date  . CARDIAC SURGERY      History reviewed. No pertinent family history.  Social History   Social History  . Marital status: Married    Spouse name: N/A  . Number of children: N/A  . Years of education: N/A   Occupational History  . Not on file.   Social History Main Topics  . Smoking status: Never Smoker  . Smokeless tobacco: Never Used  . Alcohol use 0.0 oz/week  . Drug use: Yes    Frequency: 2.0 times per week    Types: Cocaine  . Sexual activity: Yes    Birth control/ protection: None   Other Topics Concern  . Not on file   Social History Narrative  . No narrative on file     Current Outpatient Prescriptions:  .  ALPRAZolam (XANAX) 0.5 MG tablet, TAKE 1 TABLET BY MOUTH 3 TIMES A DAY AS NEEDED FOR ANXIETY., Disp: 90 tablet, Rfl: 0 .  aspirin EC 81 MG tablet, Take 1 tablet (81 mg total) by mouth daily. For heart attack prevention., Disp: , Rfl:  .  buPROPion (WELLBUTRIN XL) 300 MG 24 hr tablet, Take 1 tablet (300 mg total) by mouth daily., Disp: 90 tablet, Rfl: 0 .  clotrimazole-betamethasone (LOTRISONE) cream, Apply topically 2 (two) times daily., Disp: 45 g, Rfl: 0 .  lisinopril (PRINIVIL,ZESTRIL) 10 MG tablet, Take 1 tablet (10 mg total) by mouth daily., Disp: 90 tablet, Rfl: 0 .  omeprazole (PRILOSEC) 20 MG capsule, Take 1 capsule (20 mg total) by mouth 2 (two) times  daily before a meal., Disp: 180 capsule, Rfl: 0 .  predniSONE (STERAPRED UNI-PAK 21 TAB) 10 MG (21) TBPK tablet, Take 1 tablet (10 mg total) by mouth daily. 60 50 40 30 20 10  then STOP, Disp: 21 tablet, Rfl: 0 .  simvastatin (ZOCOR) 40 MG tablet, Take 1 tablet (40 mg total) by mouth daily., Disp: 90 tablet, Rfl: 1 .  sulfamethoxazole-trimethoprim (BACTRIM DS,SEPTRA DS) 800-160 MG tablet, Take 1 tablet by mouth 2 (two) times daily., Disp: 20 tablet, Rfl: 0 .  zolpidem (AMBIEN) 10 MG tablet, Take 1 tablet (10 mg total) by mouth at bedtime., Disp: 30 tablet, Rfl: 2  No  Known Allergies   Review of Systems  Constitutional: Negative for appetite change and fatigue.  Eyes: Negative for blurred vision.  Respiratory: Negative for shortness of breath.   Cardiovascular: Negative for chest pain and palpitations.  Musculoskeletal: Negative for myalgias.  Skin: Positive for rash.  Neurological: Negative for headaches.  Psychiatric/Behavioral: Positive for depression. The patient is not nervous/anxious and does not have insomnia.       Objective  Vitals:   11/24/16 1529  BP: 120/76  Pulse: 86  Resp: 16  Temp: 97.8 F (36.6 C)  TempSrc: Oral  SpO2: 97%  Weight: 171 lb 3.2 oz (77.7 kg)  Height: 5\' 5"  (1.651 m)    Physical Exam  Constitutional: He is oriented to person, place, and time and well-developed, well-nourished, and in no distress.  HENT:  Head: Normocephalic and atraumatic.  Cardiovascular: Normal rate, regular rhythm and normal heart sounds.   No murmur heard. Pulmonary/Chest: Effort normal and breath sounds normal. He has no wheezes.  Abdominal: Soft. Bowel sounds are normal. There is no tenderness.  Genitourinary: Penis exhibits lesions.  Genitourinary Comments: Erythematous, papulo-vescicular non draining lesion on the under shaft of the penis  Neurological: He is alert and oriented to person, place, and time.  Skin: Skin is warm and dry.  Psychiatric: Mood, memory, affect and judgment normal.  Nursing note and vitals reviewed.      Assessment & Plan  1. Recurrent major depressive disorder, in full remission (HCC) Stable, responsive to Wellbutrin - buPROPion (WELLBUTRIN XL) 300 MG 24 hr tablet; Take 1 tablet (300 mg total) by mouth daily.  Dispense: 90 tablet; Refill: 0  2. Gastroesophageal reflux disease, esophagitis presence not specified Symptoms of reflux are stable and controlled on omeprazole - omeprazole (PRILOSEC) 20 MG capsule; Take 1 capsule (20 mg total) by mouth 2 (two) times daily before a meal.  Dispense: 180  capsule; Refill: 0  3. Essential hypertension  - lisinopril (PRINIVIL,ZESTRIL) 10 MG tablet; Take 1 tablet (10 mg total) by mouth daily.  Dispense: 90 tablet; Refill: 0  4. Dyslipidemia  - simvastatin (ZOCOR) 40 MG tablet; Take 1 tablet (40 mg total) by mouth daily.  Dispense: 90 tablet; Refill: 1  5. Anxiety Symptoms of anxiety are stable and controlled on alprazolam up to 3 times daily as needed - ALPRAZolam (XANAX) 0.5 MG tablet; TAKE 1 TABLET BY MOUTH 3 TIMES A DAY AS NEEDED FOR ANXIETY.  Dispense: 90 tablet; Refill: 2  6. Penile lesion Patient has history of herpes, presentation similar to an outbreak, started on 5 day course of Valtrex, if lesions do not resolve, may need a referral to dermatology - valACYclovir (VALTREX) 1000 MG tablet; Take 1 tablet (1,000 mg total) by mouth daily.  Dispense: 5 tablet; Refill: 0   Joanathan Affeldt Asad A. Faylene KurtzShah [facility] 11/24/2016  3:37 PM

## 2017-01-13 ENCOUNTER — Telehealth: Payer: Self-pay | Admitting: Family Medicine

## 2017-01-13 DIAGNOSIS — G4700 Insomnia, unspecified: Secondary | ICD-10-CM

## 2017-01-13 MED ORDER — ZOLPIDEM TARTRATE 10 MG PO TABS: 10.0000 mg | ORAL_TABLET | Freq: Every day | ORAL | 2 refills | Status: DC

## 2017-01-13 NOTE — Telephone Encounter (Signed)
Prescription for Ambien is printed and is ready for pickup 

## 2017-01-13 NOTE — Telephone Encounter (Signed)
Pt needs refill on Ambien

## 2017-02-23 ENCOUNTER — Ambulatory Visit (INDEPENDENT_AMBULATORY_CARE_PROVIDER_SITE_OTHER): Payer: Self-pay | Admitting: Family Medicine

## 2017-02-23 ENCOUNTER — Encounter: Payer: Self-pay | Admitting: Family Medicine

## 2017-02-23 DIAGNOSIS — F419 Anxiety disorder, unspecified: Secondary | ICD-10-CM

## 2017-02-23 DIAGNOSIS — I10 Essential (primary) hypertension: Secondary | ICD-10-CM

## 2017-02-23 DIAGNOSIS — E785 Hyperlipidemia, unspecified: Secondary | ICD-10-CM

## 2017-02-23 DIAGNOSIS — K219 Gastro-esophageal reflux disease without esophagitis: Secondary | ICD-10-CM

## 2017-02-23 DIAGNOSIS — F3342 Major depressive disorder, recurrent, in full remission: Secondary | ICD-10-CM

## 2017-02-23 MED ORDER — OMEPRAZOLE 20 MG PO CPDR: 20.0000 mg | DELAYED_RELEASE_CAPSULE | Freq: Two times a day (BID) | ORAL | 0 refills | Status: DC

## 2017-02-23 MED ORDER — SIMVASTATIN 40 MG PO TABS: 40.0000 mg | ORAL_TABLET | Freq: Every day | ORAL | 0 refills | Status: DC

## 2017-02-23 MED ORDER — LISINOPRIL 10 MG PO TABS: 10.0000 mg | ORAL_TABLET | Freq: Every day | ORAL | 0 refills | Status: DC

## 2017-02-23 MED ORDER — BUPROPION HCL ER (XL) 300 MG PO TB24: 300.0000 mg | ORAL_TABLET | Freq: Every day | ORAL | 0 refills | Status: DC

## 2017-02-23 MED ORDER — ALPRAZOLAM 0.5 MG PO TABS: ORAL_TABLET | ORAL | 2 refills | Status: DC

## 2017-02-23 NOTE — Progress Notes (Signed)
Name: Billy Cisneros   MRN: [id number]    DOB: 26-Mar-1951   Date:02/23/2017       Progress Note  Subjective  Chief Complaint  Chief Complaint  Patient presents with  . Medication Refill    All Meds   . Follow-up    3  month     Anxiety  Presents for follow-up visit. Symptoms include irritability. Patient reports no chest pain, depressed mood, excessive worry, insomnia, nervous/anxious behavior, palpitations, panic, restlessness or shortness of breath. The severity of symptoms is moderate. The quality of sleep is fair.   His past medical history is significant for depression.  Depression       The patient presents with depression.  This is a recurrent problem.  The onset quality is gradual. The problem is unchanged.  Associated symptoms include no helplessness, no hopelessness, does not have insomnia, no restlessness, no headaches and not sad.  Past treatments include SSRIs - Selective serotonin reuptake inhibitors.  Past medical history includes anxiety and depression.   Hypertension  This is a chronic problem. The problem is unchanged. Associated symptoms include anxiety. Pertinent negatives include no blurred vision, chest pain, headaches, orthopnea, palpitations or shortness of breath. Past treatments include ACE inhibitors. There is no history of kidney disease, CAD/MI or CVA.  Hyperlipidemia  This is a chronic problem. The problem is controlled. Pertinent negatives include no chest pain or shortness of breath. Current antihyperlipidemic treatment includes statins.  Gastroesophageal Reflux  He reports no chest pain, no coughing, no early satiety, no heartburn or no sore throat. This is a chronic problem. The problem has been unchanged.      Past Medical History:  Diagnosis Date  . Anxiety   . GERD (gastroesophageal reflux disease)   . Gout   . Hypertension   . MI (myocardial infarction) [facility])     Past Surgical History:  Procedure Laterality Date  . CARDIAC SURGERY       History reviewed. No pertinent family history.  Social History   Social History  . Marital status: Married    Spouse name: N/A  . Number of children: N/A  . Years of education: N/A   Occupational History  . Not on file.   Social History Main Topics  . Smoking status: Never Smoker  . Smokeless tobacco: Never Used  . Alcohol use 0.0 oz/week  . Drug use: Yes    Frequency: 2.0 times per week    Types: Cocaine  . Sexual activity: Yes    Birth control/ protection: None   Other Topics Concern  . Not on file   Social History Narrative  . No narrative on file     Current Outpatient Prescriptions:  .  ALPRAZolam (XANAX) 0.5 MG tablet, TAKE 1 TABLET BY MOUTH 3 TIMES A DAY AS NEEDED FOR ANXIETY., Disp: 90 tablet, Rfl: 2 .  aspirin EC 81 MG tablet, Take 1 tablet (81 mg total) by mouth daily. For heart attack prevention., Disp: , Rfl:  .  buPROPion (WELLBUTRIN XL) 300 MG 24 hr tablet, Take 1 tablet (300 mg total) by mouth daily., Disp: 90 tablet, Rfl: 0 .  clotrimazole-betamethasone (LOTRISONE) cream, Apply topically 2 (two) times daily. (Patient taking differently: Apply topically 2 (two) times daily as needed. ), Disp: 45 g, Rfl: 0 .  lisinopril (PRINIVIL,ZESTRIL) 10 MG tablet, Take 1 tablet (10 mg total) by mouth daily., Disp: 90 tablet, Rfl: 0 .  Multiple Vitamin (MULTIVITAMIN) tablet, Take 1 tablet by mouth daily., Disp: ,  Rfl:  .  omeprazole (PRILOSEC) 20 MG capsule, Take 1 capsule (20 mg total) by mouth 2 (two) times daily before a meal., Disp: 180 capsule, Rfl: 0 .  simvastatin (ZOCOR) 40 MG tablet, Take 1 tablet (40 mg total) by mouth daily. (Patient taking differently: Take 40 mg by mouth at bedtime. ), Disp: 90 tablet, Rfl: 1 .  zolpidem (AMBIEN) 10 MG tablet, Take 1 tablet (10 mg total) by mouth at bedtime., Disp: 30 tablet, Rfl: 2 .  predniSONE (STERAPRED UNI-PAK 21 TAB) 10 MG (21) TBPK tablet, Take 1 tablet (10 mg total) by mouth daily. 60 50 40 then STOP  (Patient not taking: Reported on 02/23/2017), Disp: 21 tablet, Rfl: 0  No Known Allergies   Review of Systems  Constitutional: Positive for irritability.  HENT: Negative for sore throat.   Eyes: Negative for blurred vision.  Respiratory: Negative for cough and shortness of breath.   Cardiovascular: Negative for chest pain, palpitations and orthopnea.  Gastrointestinal: Negative for heartburn.  Neurological: Negative for headaches.  Psychiatric/Behavioral: Positive for depression. The patient is not nervous/anxious and does not have insomnia.       Objective  Vitals:   02/23/17 1403  BP: 112/74  Pulse: 91  Resp: 14  Temp: 97.7 F (36.5 C)  TempSrc: Oral  SpO2: 97%  Weight: 164 lb 6.4 oz (74.6 kg)  Height:  (1.651 m)    Physical Exam  Constitutional: He is oriented to person, place, and time and well-developed, well-nourished, and in no distress.  HENT:  Head: Normocephalic and atraumatic.  Cardiovascular: Normal rate, regular rhythm and normal heart sounds.   No murmur heard. Pulmonary/Chest: Effort normal and breath sounds normal. No respiratory distress.  Abdominal: Soft. Bowel sounds are normal. There is no tenderness.  Neurological: He is alert and oriented to person, place, and time.  Psychiatric: Mood, memory, affect and judgment normal.  Nursing note and vitals reviewed.     Assessment & Plan  1. Anxiety Stable and responsive to alprazolam taken up to 3 times daily as needed - ALPRAZolam (XANAX) 0.5 MG tablet; TAKE 1 TABLET BY MOUTH 3 TIMES A DAY AS NEEDED FOR ANXIETY.  Dispense: 90 tablet; Refill: 2  2. Dyslipidemia Continue on simvastatin, FLP at goal - simvastatin (ZOCOR) 40 MG tablet; Take 1 tablet (40 mg total) by mouth at bedtime.  Dispense: 90 tablet; Refill: 0  3. Essential hypertension BP stable on present antihypertensive treatment - lisinopril (PRINIVIL,ZESTRIL) 10 MG tablet; Take 1 tablet (10 mg total) by mouth daily.  Dispense: 90  tablet; Refill: 0  4. Gastroesophageal reflux disease, esophagitis presence not specified  - omeprazole (PRILOSEC) 20 MG capsule; Take 1 capsule (20 mg total) by mouth 2 (two) times daily before a meal.  Dispense: 180 capsule; Refill: 0  5. Recurrent major depressive disorder, in full remission (HCC)  - buPROPion (WELLBUTRIN XL) 300 MG 24 hr tablet; Take 1 tablet (300 mg total) by mouth daily.  Dispense: 90 tablet; Refill: 0   Deandrea Rion Asad A. [facility] 02/23/2017 2:08 PM

## 2017-03-29 ENCOUNTER — Other Ambulatory Visit: Payer: Self-pay | Admitting: Family Medicine

## 2017-03-29 DIAGNOSIS — F3342 Major depressive disorder, recurrent, in full remission: Secondary | ICD-10-CM

## 2017-04-13 ENCOUNTER — Ambulatory Visit: Payer: Self-pay | Admitting: Family Medicine

## 2017-04-15 ENCOUNTER — Ambulatory Visit (INDEPENDENT_AMBULATORY_CARE_PROVIDER_SITE_OTHER): Payer: Medicare Other | Admitting: Family Medicine

## 2017-04-15 ENCOUNTER — Encounter: Payer: Self-pay | Admitting: Family Medicine

## 2017-04-15 VITALS — BP 98/62 | HR 87 | Temp 98.4°F | Resp 16 | Ht 65.0 in | Wt 169.1 lb

## 2017-04-15 DIAGNOSIS — Z23 Encounter for immunization: Secondary | ICD-10-CM | POA: Diagnosis not present

## 2017-04-15 DIAGNOSIS — Z125 Encounter for screening for malignant neoplasm of prostate: Secondary | ICD-10-CM

## 2017-04-15 DIAGNOSIS — F419 Anxiety disorder, unspecified: Secondary | ICD-10-CM

## 2017-04-15 DIAGNOSIS — Z1159 Encounter for screening for other viral diseases: Secondary | ICD-10-CM

## 2017-04-15 DIAGNOSIS — Z0000 Encounter for general adult medical examination without abnormal findings: Secondary | ICD-10-CM

## 2017-04-15 DIAGNOSIS — G4700 Insomnia, unspecified: Secondary | ICD-10-CM

## 2017-04-15 DIAGNOSIS — Z1211 Encounter for screening for malignant neoplasm of colon: Secondary | ICD-10-CM

## 2017-04-15 MED ORDER — ALPRAZOLAM 0.5 MG PO TABS: ORAL_TABLET | ORAL | 2 refills | Status: DC

## 2017-04-15 MED ORDER — ZOLPIDEM TARTRATE 10 MG PO TABS: 10.0000 mg | ORAL_TABLET | Freq: Every day | ORAL | 2 refills | Status: DC

## 2017-04-15 NOTE — Progress Notes (Signed)
Name: Billy Cisneros   MRN: [id number]    DOB: [birth year]   Date:04/15/2017       Progress Note  Subjective  Chief Complaint  Chief Complaint  Patient presents with  . Annual Exam    Anxiety  Presents for follow-up visit. Symptoms include dizziness and irritability. Patient reports no depressed mood, excessive worry, nervous/anxious behavior or panic. The severity of symptoms is moderate. The quality of sleep is fair.     Pt. is here for Complete Physical Exam.  He is due for colon cancer screening, last screening over 10 years ago. He is also due for Hepatitis C screening.  He is due for prostate cancer screening.    Past Medical History:  Diagnosis Date  . Anxiety   . GERD (gastroesophageal reflux disease)   . Gout   . Hypertension   . MI (myocardial infarction) [facility](HCC)     Past Surgical History:  Procedure Laterality Date  . CARDIAC SURGERY      No family history on file.  Social History   Socioeconomic History  . Marital status: Married    Spouse name: Not on file  . Number of children: Not on file  . Years of education: Not on file  . Highest education level: Not on file  Social Needs  . Financial resource strain: Not on file  . Food insecurity - worry: Not on file  . Food insecurity - inability: Not on file  . Transportation needs - medical: Not on file  . Transportation needs - non-medical: Not on file  Occupational History  . Not on file  Tobacco Use  . Smoking status: Never Smoker  . Smokeless tobacco: Never Used  Substance and Sexual Activity  . Alcohol use: Yes    Alcohol/week: 0.0 oz  . Drug use: Yes    Frequency: 2.0 times per week    Types: Cocaine  . Sexual activity: Yes    Birth control/protection: None  Other Topics Concern  . Not on file  Social History Narrative  . Not on file     Current Outpatient Medications:  .  acyclovir (ZOVIRAX) 800 MG tablet, as needed., Disp: , Rfl:  .  ALPRAZolam (XANAX) 0.5 MG tablet, TAKE 1  TABLET BY MOUTH 3 TIMES A DAY AS NEEDED FOR ANXIETY., Disp: 90 tablet, Rfl: 2 .  aspirin EC 81 MG tablet, Take 1 tablet (81 mg total) by mouth daily. For heart attack prevention., Disp: , Rfl:  .  buPROPion (WELLBUTRIN XL) 300 MG 24 hr tablet, Take 1 tablet (300 mg total) by mouth daily., Disp: 90 tablet, Rfl: 0 .  clotrimazole-betamethasone (LOTRISONE) cream, Apply topically 2 (two) times daily. (Patient taking differently: Apply topically 2 (two) times daily as needed. ), Disp: 45 g, Rfl: 0 .  lisinopril (PRINIVIL,ZESTRIL) 10 MG tablet, Take 1 tablet (10 mg total) by mouth daily., Disp: 90 tablet, Rfl: 0 .  Multiple Vitamin (MULTIVITAMIN) tablet, Take 1 tablet by mouth daily., Disp: , Rfl:  .  omeprazole (PRILOSEC) 20 MG capsule, Take 1 capsule (20 mg total) by mouth 2 (two) times daily before a meal., Disp: 180 capsule, Rfl: 0 .  simvastatin (ZOCOR) 40 MG tablet, Take 1 tablet (40 mg total) by mouth at bedtime., Disp: 90 tablet, Rfl: 0 .  zolpidem (AMBIEN) 10 MG tablet, Take 1 tablet (10 mg total) by mouth at bedtime., Disp: 30 tablet, Rfl: 2  No Known Allergies   Review of Systems  Constitutional: Positive for irritability. Negative  for chills and fever.  HENT: Negative for congestion, ear pain and sinus pain.   Eyes: Negative for double vision.  Respiratory: Negative for hemoptysis and sputum production.   Cardiovascular: Negative for leg swelling.  Gastrointestinal: Negative for blood in stool, constipation, diarrhea and vomiting.  Genitourinary: Negative for dysuria and hematuria.  Musculoskeletal: Negative for back pain and joint pain.  Skin: Negative for itching and rash.  Neurological: Positive for dizziness.  Psychiatric/Behavioral: The patient is not nervous/anxious.      Objective  Vitals:   04/15/17 1036  BP: 98/62  Pulse: 87  Resp: 16  Temp: 98.4 F (36.9 C)  TempSrc: Oral  SpO2: 97%  Weight: 169 lb 1.6 oz (76.7 kg)  Height: 5\' 5"  (1.651 m)    Physical Exam   Constitutional: He is oriented to person, place, and time and well-developed, well-nourished, and in no distress.  HENT:  Head: Normocephalic and atraumatic.  Right Ear: External ear normal.  Left Ear: External ear normal.  Mouth/Throat: Oropharynx is clear and moist.  Eyes: Conjunctivae are normal.  Neck: Neck supple. No thyromegaly present.  Cardiovascular: Normal rate, regular rhythm and normal heart sounds.  No murmur heard. Pulmonary/Chest: Effort normal and breath sounds normal. He has no wheezes.  Abdominal: Soft. Bowel sounds are normal.  Genitourinary: Rectum normal and prostate normal. Prostate is not enlarged and not tender.  Musculoskeletal: He exhibits no edema.  Neurological: He is alert and oriented to person, place, and time.  Psychiatric: Mood, memory, affect and judgment normal.  Nursing note and vitals reviewed.     Assessment & Plan  1. Annual physical exam  - CBC with Differential/Platelet - COMPLETE METABOLIC PANEL WITH GFR - TSH - VITAMIN D 25 Hydroxy (Vit-D Deficiency, Fractures)  2. Screening for prostate cancer  - PSA  3. Screening for colon cancer  - Ambulatory referral to Gastroenterology  4. Need for immunization against influenza  - Flu Vaccine QUAD 6+ mos PF IM (Fluarix Quad PF)  5. Anxiety Symptoms of anxiety are stable on alprazolam taken up to 3 times daily as needed, patient advised of my impending departure from the practice and the need to see a psychiatrist for further management of anxiety. - ALPRAZolam (XANAX) 0.5 MG tablet; TAKE 1 TABLET BY MOUTH 3 TIMES A DAY AS NEEDED FOR ANXIETY.  Dispense: 90 tablet; Refill: 2  6. Insomnia, unspecified type  - zolpidem (AMBIEN) 10 MG tablet; Take 1 tablet (10 mg total) by mouth at bedtime.  Dispense: 30 tablet; Refill: 2   Frans Valente Asad A. Faylene [facility]zSh[facility] 04/15/2017 10:51 AM

## 2017-04-16 ENCOUNTER — Other Ambulatory Visit: Payer: Self-pay

## 2017-04-16 DIAGNOSIS — F3342 Major depressive disorder, recurrent, in full remission: Secondary | ICD-10-CM

## 2017-04-16 DIAGNOSIS — Z125 Encounter for screening for malignant neoplasm of prostate: Secondary | ICD-10-CM | POA: Diagnosis not present

## 2017-04-16 DIAGNOSIS — Z0000 Encounter for general adult medical examination without abnormal findings: Secondary | ICD-10-CM | POA: Diagnosis not present

## 2017-04-16 DIAGNOSIS — K219 Gastro-esophageal reflux disease without esophagitis: Secondary | ICD-10-CM

## 2017-04-16 DIAGNOSIS — E785 Hyperlipidemia, unspecified: Secondary | ICD-10-CM

## 2017-04-16 DIAGNOSIS — G4700 Insomnia, unspecified: Secondary | ICD-10-CM

## 2017-04-16 DIAGNOSIS — I10 Essential (primary) hypertension: Secondary | ICD-10-CM

## 2017-04-16 DIAGNOSIS — R21 Rash and other nonspecific skin eruption: Secondary | ICD-10-CM

## 2017-04-16 LAB — HEPATITIS C ANTIBODY
HEP C AB: NONREACTIVE
SIGNAL TO CUT-OFF: 0.01 (ref <1.00)

## 2017-04-16 MED ORDER — LISINOPRIL 10 MG PO TABS: 10.0000 mg | ORAL_TABLET | Freq: Every day | ORAL | 0 refills | Status: DC

## 2017-04-16 MED ORDER — SIMVASTATIN 40 MG PO TABS: 40.0000 mg | ORAL_TABLET | Freq: Every day | ORAL | 0 refills | Status: DC

## 2017-04-16 MED ORDER — OMEPRAZOLE 20 MG PO CPDR: 20.0000 mg | DELAYED_RELEASE_CAPSULE | Freq: Two times a day (BID) | ORAL | 0 refills | Status: DC

## 2017-04-16 MED ORDER — CLOTRIMAZOLE-BETAMETHASONE 1-0.05 % EX CREA: TOPICAL_CREAM | Freq: Two times a day (BID) | CUTANEOUS | 0 refills | Status: DC | PRN

## 2017-04-16 MED ORDER — BUPROPION HCL ER (XL) 300 MG PO TB24: 300.0000 mg | ORAL_TABLET | Freq: Every day | ORAL | 0 refills | Status: DC

## 2017-04-17 LAB — CBC WITH DIFFERENTIAL/PLATELET
BASOS PCT: 0.8 %
Basophils Absolute: 31 cells/uL (ref 0–200)
EOS ABS: 234 {cells}/uL (ref 15–500)
Eosinophils Relative: 6 %
HCT: 38.5 % (ref 38.5–50.0)
HEMOGLOBIN: 13.1 g/dL — AB (ref 13.2–17.1)
Lymphs Abs: 819 cells/uL — ABNORMAL LOW (ref 850–3900)
MCH: 30.4 pg (ref 27.0–33.0)
MCHC: 34 g/dL (ref 32.0–36.0)
MCV: 89.3 fL (ref 80.0–100.0)
MONOS PCT: 14.8 %
MPV: 10.4 fL (ref 7.5–12.5)
NEUTROS ABS: 2239 {cells}/uL (ref 1500–7800)
Neutrophils Relative %: 57.4 %
PLATELETS: 253 10*3/uL (ref 140–400)
RBC: 4.31 10*6/uL (ref 4.20–5.80)
RDW: 12.5 % (ref 11.0–15.0)
Total Lymphocyte: 21 %
WBC mixed population: 577 cells/uL (ref 200–950)
WBC: 3.9 10*3/uL (ref 3.8–10.8)

## 2017-04-17 LAB — COMPLETE METABOLIC PANEL WITH GFR
AG Ratio: 1.9 (calc) (ref 1.0–2.5)
ALKALINE PHOSPHATASE (APISO): 91 U/L (ref 40–115)
ALT: 14 U/L (ref 9–46)
AST: 16 U/L (ref 10–35)
Albumin: 4.2 g/dL (ref 3.6–5.1)
BILIRUBIN TOTAL: 0.7 mg/dL (ref 0.2–1.2)
BUN / CREAT RATIO: 10 (calc) (ref 6–22)
BUN: 17 mg/dL (ref 7–25)
CHLORIDE: 105 mmol/L (ref 98–110)
CO2: 32 mmol/L (ref 20–32)
CREATININE: 1.63 mg/dL — AB (ref 0.70–1.25)
Calcium: 9.3 mg/dL (ref 8.6–10.3)
GFR, Est African American: 51 mL/min/{1.73_m2} — ABNORMAL LOW (ref >=60)
GFR, Est Non African American: 44 mL/min/{1.73_m2} — ABNORMAL LOW (ref >=60)
GLUCOSE: 91 mg/dL (ref 65–99)
Globulin: 2.2 g/dL (calc) (ref 1.9–3.7)
Potassium: 4.6 mmol/L (ref 3.5–5.3)
Sodium: 143 mmol/L (ref 135–146)
Total Protein: 6.4 g/dL (ref 6.1–8.1)

## 2017-04-17 LAB — PSA: PSA: 0.7 ng/mL (ref <=4.0)

## 2017-04-17 LAB — TSH: TSH: 3.17 m[IU]/L (ref 0.40–4.50)

## 2017-04-17 LAB — VITAMIN D 25 HYDROXY (VIT D DEFICIENCY, FRACTURES): Vit D, 25-Hydroxy: 38 ng/mL (ref 30–100)

## 2017-04-22 ENCOUNTER — Telehealth: Payer: Self-pay | Admitting: Family Medicine

## 2017-04-22 DIAGNOSIS — F419 Anxiety disorder, unspecified: Secondary | ICD-10-CM

## 2017-04-22 DIAGNOSIS — G4700 Insomnia, unspecified: Secondary | ICD-10-CM

## 2017-04-22 NOTE — Telephone Encounter (Signed)
Copied from CRM [id number]. Topic: Quick Communication - See Telephone Encounter >> Apr 22, 2017  1:51 PM Guinevere FerrariMorris, Natania Finigan E, NT wrote: CRM for notification. See Telephone encounter for: Lex is calling from [facility] is calling for a medication approval for ALPRAZolam (XANAX) 0.5 MG tablet and zolpidem (AMBIEN) 10 MG tablet. Lex request that the doctor send the a fax or escribe. Fax number is  [phone] Reference number is [id number]        04/22/17.

## 2017-04-22 NOTE — Telephone Encounter (Signed)
Rx has been approved on 04/15/2017 but was never faxed over to [facility]. Therefore called in a verbal from the RX that was approved on 04/15/2017

## 2017-04-23 ENCOUNTER — Telehealth: Payer: Self-pay | Admitting: Family Medicine

## 2017-04-23 NOTE — Telephone Encounter (Signed)
Copied from CRM #[id number]. Topic: Quick Communication - See Telephone Encounter >> Apr 23, 2017  3:33 PM Diana EvesHoyt, Maryann B wrote: CRM for notification. See Telephone encounter for:  Pt wanting a refill on acyclovir and for Dr. Fara BorosShahs nurse to call about medication. [place] if [place].  04/23/17.

## 2017-04-23 NOTE — Telephone Encounter (Signed)
Lab results given to patient and his wife, with verbal understanding.

## 2017-04-23 NOTE — Telephone Encounter (Signed)
Pt wanting a refill on acyclovir and for Dr. Fara BorosShahs nurse to call about medication. [place] if it's called [place].   See Telephone encounter for: Lex is calling from [facility] is calling for a medication approval for ALPRAZolam (XANAX) 0.5 MG tablet and zolpidem (AMBIEN) 10 MG tablet. Lex request that the doctor send the a fax or escribe. Fax number is  [phone] Reference number is [id number]

## 2017-04-24 ENCOUNTER — Other Ambulatory Visit: Payer: Self-pay

## 2017-04-24 NOTE — Telephone Encounter (Signed)
Patient is aware that he has not had this RX filled in the last year or more. Pt stated that he was given a small supply of the medicine and was told if it help he can ask for more supply. Pt stated it did help and he didn't have any issue unitl now and spoke to you about getting a refill on his last visit.

## 2017-04-27 ENCOUNTER — Telehealth: Payer: Self-pay

## 2017-04-27 NOTE — Telephone Encounter (Signed)
LVM for pt to contact office to schedule colonoscopy. 

## 2017-04-28 ENCOUNTER — Encounter: Payer: Self-pay | Admitting: Family Medicine

## 2017-04-29 ENCOUNTER — Telehealth: Payer: Self-pay | Admitting: Gastroenterology

## 2017-04-29 NOTE — Telephone Encounter (Signed)
Patient called back to schedule procedure.

## 2017-05-01 ENCOUNTER — Telehealth: Payer: Self-pay

## 2017-05-01 NOTE — Telephone Encounter (Signed)
LVM for pt to contact office to schedule colonoscopy. 

## 2017-05-13 ENCOUNTER — Telehealth: Payer: Self-pay

## 2017-05-13 ENCOUNTER — Other Ambulatory Visit: Payer: Self-pay

## 2017-05-13 DIAGNOSIS — Z1211 Encounter for screening for malignant neoplasm of colon: Secondary | ICD-10-CM

## 2017-05-13 NOTE — Telephone Encounter (Signed)
Gastroenterology Pre-Procedure Review  Request Date: 05/25/17 Requesting Physician: Dr. Tobi BastosAnna  PATIENT REVIEW QUESTIONS: The patient responded to the following health history questions as indicated:    1. Are you having any GI issues? no 2. Do you have a personal history of Polyps? no 3. Do you have a family history of Colon Cancer or Polyps? no 4. Diabetes Mellitus? no 5. Joint replacements in the past 12 months?no 6. Major health problems in the past 3 months?no 7. Any artificial heart valves, MVP, or defibrillator?no    MEDICATIONS & ALLERGIES:    Patient reports the following regarding taking any anticoagulation/antiplatelet therapy:   Plavix, Coumadin, Eliquis, Xarelto, Lovenox, Pradaxa, Brilinta, or Effient? no Aspirin? yes (asa)  Patient confirms/reports the following medications:  Current Outpatient Medications  Medication Sig Dispense Refill  . acyclovir (ZOVIRAX) 800 MG tablet as needed.    . ALPRAZolam (XANAX) 0.5 MG tablet TAKE 1 TABLET BY MOUTH 3 TIMES A DAY AS NEEDED FOR ANXIETY. 90 tablet 2  . aspirin EC 81 MG tablet Take 1 tablet (81 mg total) by mouth daily. For heart attack prevention.    Marland Kitchen. buPROPion (WELLBUTRIN XL) 300 MG 24 hr tablet Take 1 tablet (300 mg total) by mouth daily. 90 tablet 0  . clotrimazole-betamethasone (LOTRISONE) cream Apply topically 2 (two) times daily as needed. 15 g 0  . lisinopril (PRINIVIL,ZESTRIL) 10 MG tablet Take 1 tablet (10 mg total) by mouth daily. 90 tablet 0  . Multiple Vitamin (MULTIVITAMIN) tablet Take 1 tablet by mouth daily.    Marland Kitchen. omeprazole (PRILOSEC) 20 MG capsule Take 1 capsule (20 mg total) by mouth 2 (two) times daily before a meal. 180 capsule 0  . simvastatin (ZOCOR) 40 MG tablet Take 1 tablet (40 mg total) by mouth at bedtime. 90 tablet 0  . zolpidem (AMBIEN) 10 MG tablet Take 1 tablet (10 mg total) by mouth at bedtime. 30 tablet 2   No current facility-administered medications for this visit.     Patient  confirms/reports the following allergies:  No Known Allergies  No orders of the defined types were placed in this encounter.   AUTHORIZATION INFORMATION Primary Insurance: 1D#: Group #:  Secondary Insurance: 1D#: Group #:  SCHEDULE INFORMATION: Date: 05/25/17 Time: Location:[facility]

## 2017-05-25 ENCOUNTER — Encounter: Payer: Self-pay | Admitting: Student

## 2017-05-25 ENCOUNTER — Encounter
Admission: RE | Disposition: A | Discharge status: Home / Self Care | Payer: Self-pay | Source: Ambulatory Visit | Attending: Gastroenterology

## 2017-05-25 ENCOUNTER — Ambulatory Visit
Admission: RE | Admit: 2017-05-25 | Discharge: 2017-05-25 | Disposition: A | Discharge status: Home / Self Care | Payer: Medicare Other | Source: Ambulatory Visit | Attending: Gastroenterology | Admitting: Gastroenterology

## 2017-05-25 ENCOUNTER — Ambulatory Visit: Payer: Medicare Other | Admitting: Anesthesiology

## 2017-05-25 ENCOUNTER — Other Ambulatory Visit: Payer: Self-pay

## 2017-05-25 DIAGNOSIS — Z1211 Encounter for screening for malignant neoplasm of colon: Secondary | ICD-10-CM

## 2017-05-25 DIAGNOSIS — K640 First degree hemorrhoids: Secondary | ICD-10-CM | POA: Insufficient documentation

## 2017-05-25 DIAGNOSIS — Z951 Presence of aortocoronary bypass graft: Secondary | ICD-10-CM | POA: Insufficient documentation

## 2017-05-25 DIAGNOSIS — I2510 Atherosclerotic heart disease of native coronary artery without angina pectoris: Secondary | ICD-10-CM | POA: Insufficient documentation

## 2017-05-25 DIAGNOSIS — F329 Major depressive disorder, single episode, unspecified: Secondary | ICD-10-CM | POA: Insufficient documentation

## 2017-05-25 DIAGNOSIS — K5730 Diverticulosis of large intestine without perforation or abscess without bleeding: Secondary | ICD-10-CM | POA: Diagnosis not present

## 2017-05-25 DIAGNOSIS — K219 Gastro-esophageal reflux disease without esophagitis: Secondary | ICD-10-CM | POA: Insufficient documentation

## 2017-05-25 DIAGNOSIS — K5790 Diverticulosis of intestine, part unspecified, without perforation or abscess without bleeding: Secondary | ICD-10-CM | POA: Diagnosis not present

## 2017-05-25 DIAGNOSIS — Z8371 Family history of colonic polyps: Secondary | ICD-10-CM | POA: Diagnosis not present

## 2017-05-25 DIAGNOSIS — Z7982 Long term (current) use of aspirin: Secondary | ICD-10-CM | POA: Insufficient documentation

## 2017-05-25 DIAGNOSIS — Z79899 Other long term (current) drug therapy: Secondary | ICD-10-CM | POA: Diagnosis not present

## 2017-05-25 DIAGNOSIS — I10 Essential (primary) hypertension: Secondary | ICD-10-CM | POA: Diagnosis not present

## 2017-05-25 DIAGNOSIS — K649 Unspecified hemorrhoids: Secondary | ICD-10-CM | POA: Diagnosis not present

## 2017-05-25 DIAGNOSIS — F419 Anxiety disorder, unspecified: Secondary | ICD-10-CM | POA: Insufficient documentation

## 2017-05-25 HISTORY — PX: COLONOSCOPY WITH PROPOFOL: SHX5780

## 2017-05-25 HISTORY — DX: Major depressive disorder, single episode, unspecified: F32.9

## 2017-05-25 HISTORY — DX: Atherosclerotic heart disease of native coronary artery without angina pectoris: I25.10

## 2017-05-25 HISTORY — DX: Depression, unspecified: F32.A

## 2017-05-25 SURGERY — COLONOSCOPY WITH PROPOFOL
Anesthesia: General

## 2017-05-25 MED ORDER — PROPOFOL 500 MG/50ML IV EMUL
INTRAVENOUS | Status: AC
  Filled 2017-05-25: qty 50

## 2017-05-25 MED ORDER — PROPOFOL 10 MG/ML IV BOLUS
INTRAVENOUS | Status: DC | PRN
  Administered 2017-05-25: 50 mg via INTRAVENOUS

## 2017-05-25 MED ORDER — LIDOCAINE HCL (PF) 2 % IJ SOLN
INTRAMUSCULAR | Status: AC
  Filled 2017-05-25: qty 10

## 2017-05-25 MED ORDER — LIDOCAINE HCL (CARDIAC) 20 MG/ML IV SOLN
INTRAVENOUS | Status: DC | PRN
  Administered 2017-05-25: 50 mg via INTRAVENOUS

## 2017-05-25 MED ORDER — PROPOFOL 500 MG/50ML IV EMUL
INTRAVENOUS | Status: DC | PRN
  Administered 2017-05-25: 160 ug/kg/min via INTRAVENOUS

## 2017-05-25 MED ORDER — SODIUM CHLORIDE 0.9 % IV SOLN
INTRAVENOUS | Status: DC
  Administered 2017-05-25: 10:00:00 via INTRAVENOUS

## 2017-05-25 NOTE — Op Note (Signed)
[facility] Patient Name: Billy Cisneros Procedure Date: 05/25/2017 10:48 AM MRN: [id number] Account #: [id number] Date of Birth: 03/11/1951 Admit Type: Outpatient Age: 67 Room: [facility] ENDO ROOM 4 Gender: Male Note Status: Finalized Procedure:            Colonoscopy Indications:          Colon cancer screening in patient at increased risk:                        Family history of 1st-degree relative with colon polyps Providers:            Wyline Mood MD, MD Referring MD:         Shelbie Ammons. Sherryll Burger (Referring MD) Medicines:            Monitored Anesthesia Care Complications:        No immediate complications. Procedure:            Pre-Anesthesia Assessment:                       - Prior to the procedure, a History and Physical was                        performed, and patient medications, allergies and                        sensitivities were reviewed. The patient's tolerance of                        previous anesthesia was reviewed.                       - The risks and benefits of the procedure and the                        sedation options and risks were discussed with the                        patient. All questions were answered and informed                        consent was obtained.                       - ASA Grade Assessment: III - A patient with severe                        systemic disease.                       After obtaining informed consent, the colonoscope was                        passed under direct vision. Throughout the procedure,                        the patient's blood pressure, pulse, and oxygen                        saturations were monitored continuously. The  Colonoscope was introduced through the anus and                        advanced to the the cecum, identified by the                        appendiceal orifice, IC valve and transillumination.                        The colonoscopy was  performed with ease. The patient                        tolerated the procedure well. The quality of the bowel                        preparation was good. Findings:      The perianal and digital rectal examinations were normal.      A few small-mouthed diverticula were found in the entire colon.      Non-bleeding internal hemorrhoids were found during retroflexion. The       hemorrhoids were medium-sized and Grade I (internal hemorrhoids that do       not prolapse).      The exam was otherwise without abnormality on direct and retroflexion       views. Impression:           - Diverticulosis in the entire examined colon.                       - Non-bleeding internal hemorrhoids.                       - The examination was otherwise normal on direct and                        retroflexion views.                       - No specimens collected. Recommendation:       - Discharge patient to home.                       - Advance diet as tolerated.                       - Continue present medications.                       - Repeat colonoscopy in 5 years for surveillance. Procedure Code(s):    --- Professional ---                       [id number], Colonoscopy, flexible; diagnostic, including                        collection of specimen(s) by brushing or washing, when                        performed (separate procedure) Diagnosis Code(s):    --- Professional ---                       Z83.71, Family history of colonic polyps  K64.0, First degree hemorrhoids                       K57.30, Diverticulosis of large intestine without                        perforation or abscess without bleeding CPT copyright 2016 [employer]. All rights reserved. The codes documented in this report are preliminary and upon coder review may  be revised to meet current compliance requirements. Wyline MoodKiran Fumiye Lubben, MD Wyline MoodKiran Hatcher Froning MD, MD 05/25/2017 11:16:11 AM This report has been signed  electronically. Number of Addenda: 0 Note Initiated On: 05/25/2017 10:48 AM Scope Withdrawal Time: 0 hours 9 minutes 49 seconds  Total Procedure Duration: 0 hours 12 minutes 17 seconds       [facility]

## 2017-05-25 NOTE — H&P (Signed)
Wyline MoodKiran Juniper Cobey, MD [address], [address], [place], [place], [id number] [address], [address], [place], [place], [id number] Phone: [phone]  Fax: [phone]  Primary Care Physician:  Ellyn HackShah, Billy Asad A, MD   Pre-Procedure History & Physical: HPI:  Billy Cisneros is a 67 y.o. male is here for an colonoscopy.   Past Medical History:  Diagnosis Date  . Anxiety   . Coronary artery disease   . Depression   . GERD (gastroesophageal reflux disease)   . Gout   . Hypertension     Past Surgical History:  Procedure Laterality Date  . CARDIAC SURGERY    . CORONARY ARTERY BYPASS GRAFT      Prior to Admission medications   Medication Sig Start Date End Date Taking? Authorizing Provider  ALPRAZolam (XANAX) 0.5 MG tablet TAKE 1 TABLET BY MOUTH 3 TIMES A DAY AS NEEDED FOR ANXIETY. 04/15/17  Yes Ellyn HackShah, Billy Asad A, MD  buPROPion (WELLBUTRIN XL) 300 MG 24 hr tablet Take 1 tablet (300 mg total) by mouth daily. 04/16/17  Yes Ellyn HackShah, Billy Asad A, MD  clotrimazole-betamethasone (LOTRISONE) cream Apply topically 2 (two) times daily as needed. 04/16/17  Yes Velta AddisonShah, Billy Asad A, MD  lisinopril (PRINIVIL,ZESTRIL) 10 MG tablet Take 1 tablet (10 mg total) by mouth daily. 04/16/17  Yes Ellyn HackShah, Billy Asad A, MD  Multiple Vitamin (MULTIVITAMIN) tablet Take 1 tablet by mouth daily.   Yes [provider]  omeprazole (PRILOSEC) 20 MG capsule Take 1 capsule (20 mg total) by mouth 2 (two) times daily before a meal. 04/16/17  Yes Ellyn HackShah, Billy Asad A, MD  simvastatin (ZOCOR) 40 MG tablet Take 1 tablet (40 mg total) by mouth at bedtime. 04/16/17  Yes Ellyn HackShah, Billy Asad A, MD  zolpidem (AMBIEN) 10 MG tablet Take 1 tablet (10 mg total) by mouth at bedtime. 04/15/17  Yes Ellyn HackShah, Billy Asad A, MD  acyclovir (ZOVIRAX) 800 MG tablet as needed. 10/27/14   [provider]  aspirin EC 81 MG tablet Take 1 tablet (81 mg total) by mouth daily. For heart attack prevention. 11/22/11   Verne SpurrMashburn, Neil T, PA-C    Allergies as  of 05/13/2017  . (No Known Allergies)    History reviewed. No pertinent family history.  Social History   Socioeconomic History  . Marital status: Married    Spouse name: Not on file  . Number of children: Not on file  . Years of education: Not on file  . Highest education level: Not on file  Social Needs  . Financial resource strain: Not on file  . Food insecurity - worry: Not on file  . Food insecurity - inability: Not on file  . Transportation needs - medical: Not on file  . Transportation needs - non-medical: Not on file  Occupational History  . Not on file  Tobacco Use  . Smoking status: Never Smoker  . Smokeless tobacco: Never Used  Substance and Sexual Activity  . Alcohol use: Yes    Alcohol/week: 0.0 oz    Comment: 3 beer a month  . Drug use: Yes    Frequency: 2.0 times per week    Types: Cocaine    Comment: last use 15-20 years per patient  . Sexual activity: Yes    Birth control/protection: None  Other Topics Concern  . Not on file  Social History Narrative  . Not on file    Review of Systems: See HPI, otherwise negative ROS  Physical Exam: BP  128/78   Pulse 82   Temp (!) 96 F (35.6 C) (Tympanic)   Resp 16   Ht 5\' 5"  (1.651 m)   Wt 160 lb (72.6 kg)   SpO2 98%   BMI 26.63 kg/m  General:   Alert,  pleasant and cooperative in NAD Head:  Normocephalic and atraumatic. Neck:  Supple; no masses or thyromegaly. Lungs:  Clear throughout to auscultation, normal respiratory effort.    Heart:  +S1, +S2, Regular rate and rhythm, No edema. Abdomen:  Soft, nontender and nondistended. Normal bowel sounds, without guarding, and without rebound.   Neurologic:  Alert and  oriented x4;  grossly normal neurologically.  Impression/Plan: Billy Cisneros is here for an colonoscopy to be performed for Screening colonoscopy , bother had colon polyps  Risks, benefits, limitations, and alternatives regarding  colonoscopy have been reviewed with the patient.   Questions have been answered.  All parties agreeable.   Wyline Mood, MD  05/25/2017, 10:50 AM

## 2017-05-25 NOTE — Anesthesia Preprocedure Evaluation (Signed)
Anesthesia Evaluation  Patient identified by MRN, date of birth, ID band Patient awake    Reviewed: Allergy & Precautions, NPO status , Patient's Chart, lab work & pertinent test results  History of Anesthesia Complications Negative for: history of anesthetic complications  Airway Mallampati: III       Dental  (+) Partial Upper   Pulmonary neg sleep apnea, neg COPD,           Cardiovascular hypertension, Pt. on medications + CAD and + CABG  (-) CHF (-) dysrhythmias (-) Valvular Problems/Murmurs     Neuro/Psych neg Seizures Anxiety Depression    GI/Hepatic Neg liver ROS, GERD  Medicated,  Endo/Other  neg diabetes  Renal/GU negative Renal ROS     Musculoskeletal   Abdominal   Peds  Hematology   Anesthesia Other Findings   Reproductive/Obstetrics                            Anesthesia Physical Anesthesia Plan  ASA: III  Anesthesia Plan: General   Post-op Pain Management:    Induction: Intravenous  PONV Risk Score and Plan:   Airway Management Planned: Nasal Cannula  Additional Equipment:   Intra-op Plan:   Post-operative Plan:   Informed Consent: I have reviewed the patients History and Physical, chart, labs and discussed the procedure including the risks, benefits and alternatives for the proposed anesthesia with the patient or authorized representative who has indicated his/her understanding and acceptance.     Plan Discussed with:   Anesthesia Plan Comments:         Anesthesia Quick Evaluation

## 2017-05-25 NOTE — Anesthesia Post-op Follow-up Note (Signed)
Anesthesia QCDR form completed.        

## 2017-05-25 NOTE — Anesthesia Postprocedure Evaluation (Signed)
Anesthesia Post Note  Patient: Billy Cisneros  Procedure(s) Performed: COLONOSCOPY WITH PROPOFOL (N/A )  Patient location during evaluation: Endoscopy Anesthesia Type: General Level of consciousness: awake and alert Pain management: pain level controlled Vital Signs Assessment: post-procedure vital signs reviewed and stable Respiratory status: spontaneous breathing and respiratory function stable Cardiovascular status: stable Anesthetic complications: no     Last Vitals:  Vitals:   05/25/17 1119 05/25/17 1120  BP: 93/69 93/69  Pulse: 71 72  Resp: 19 18  Temp: (!) 35.6 C (!) 35.6 C  SpO2: 95% 96%    Last Pain:  Vitals:   05/25/17 1120  TempSrc: Tympanic                 KEPHART,WILLIAM K

## 2017-05-25 NOTE — Anesthesia Procedure Notes (Signed)
Date/Time: 05/25/2017 10:57 AM Performed by: Ginger CarneMichelet, Kennett Symes, CRNA Pre-anesthesia Checklist: Patient identified, Emergency Drugs available, Suction available, Patient being monitored and Timeout performed Patient Re-evaluated:Patient Re-evaluated prior to induction Oxygen Delivery Method: Nasal cannula Preoxygenation: Pre-oxygenation with 100% oxygen

## 2017-05-25 NOTE — Transfer of Care (Signed)
Immediate Anesthesia Transfer of Care Note  Patient: Billy Cisneros  Procedure(s) Performed: COLONOSCOPY WITH PROPOFOL (N/A )  Patient Location: PACU  Anesthesia Type:General  Level of Consciousness: sedated  Airway & Oxygen Therapy: Patient Spontanous Breathing and Patient connected to nasal cannula oxygen  Post-op Assessment: Report given to RN and Post -op Vital signs reviewed and stable  Post vital signs: Reviewed and stable  Last Vitals:  Vitals:   05/25/17 1119 05/25/17 1120  BP: 93/69 93/69  Pulse: 71 72  Resp: 19 18  Temp: (!) 35.6 C (!) 35.6 C  SpO2: 95% 96%    Last Pain:  Vitals:   05/25/17 1120  TempSrc: Tympanic         Complications: No apparent anesthesia complications

## 2017-05-26 ENCOUNTER — Encounter: Payer: Self-pay | Admitting: Gastroenterology

## 2017-06-04 ENCOUNTER — Other Ambulatory Visit: Payer: Self-pay | Admitting: Family Medicine

## 2017-06-04 DIAGNOSIS — E785 Hyperlipidemia, unspecified: Secondary | ICD-10-CM

## 2017-06-04 DIAGNOSIS — K219 Gastro-esophageal reflux disease without esophagitis: Secondary | ICD-10-CM

## 2017-06-04 DIAGNOSIS — I10 Essential (primary) hypertension: Secondary | ICD-10-CM

## 2017-06-04 DIAGNOSIS — F3342 Major depressive disorder, recurrent, in full remission: Secondary | ICD-10-CM

## 2017-06-12 ENCOUNTER — Ambulatory Visit (INDEPENDENT_AMBULATORY_CARE_PROVIDER_SITE_OTHER): Payer: Medicare Other | Admitting: Family Medicine

## 2017-06-12 ENCOUNTER — Encounter: Payer: Self-pay | Admitting: Family Medicine

## 2017-06-12 VITALS — BP 120/82 | HR 86 | Temp 97.9°F | Resp 16 | Ht 65.0 in | Wt 169.0 lb

## 2017-06-12 DIAGNOSIS — M7989 Other specified soft tissue disorders: Secondary | ICD-10-CM | POA: Diagnosis not present

## 2017-06-12 DIAGNOSIS — M79674 Pain in right toe(s): Secondary | ICD-10-CM

## 2017-06-12 MED ORDER — PREDNISONE 10 MG PO TABS: 10.0000 mg | ORAL_TABLET | Freq: Every day | ORAL | 0 refills | Status: DC

## 2017-06-12 NOTE — Progress Notes (Signed)
Name: Billy Cisneros   MRN: [id number]    DOB: [birth year]   Date:06/12/2017       Progress Note  Subjective  Chief Complaint  Chief Complaint  Patient presents with  . Foot Swelling    foot pain and swelling for 3 days    HPI  Pt presents with concern for flare of his intermittent RIGHT foot pain.  RIGHT foot is erythematous and painful, but not swollen or hot.  He thinks this started 3 days ago after helping with some construction and wearing steel-toed boots.  He has been seen for this several times in the past; he has seen Dr. Charlsie Merlesegal with podiatry back in 2017 - he was given a Kenalog injection, placed in an Unna boot, and this did help his pain.  He states this episode is not as bad as that previous episodes. He has done well on prednisone for more minor flares in the past.  He does not want to go back to the podiatrist at this time.  Patient Active Problem List   Diagnosis Date Noted  . Acute gout 02/19/2016  . GERD (gastroesophageal reflux disease) 01/21/2016  . Pain and swelling of right wrist 08/13/2015  . Productive cough 08/13/2015  . Chronic right shoulder pain 06/12/2015  . Cannot sleep 06/12/2015  . Dyslipidemia 03/12/2015  . Cervical radiculopathy 01/08/2015  . Annual physical exam 01/01/2015  . H/O coronary artery bypass surgery 12/11/2014  . Hypertension 12/04/2014  . Anxiety 12/04/2014  . Chest pain 12/04/2014  . Major depression 11/21/2011    Social History   Tobacco Use  . Smoking status: Never Smoker  . Smokeless tobacco: Never Used  Substance Use Topics  . Alcohol use: Yes    Alcohol/week: 0.0 oz    Comment: 3 beer a month     Current Outpatient Medications:  .  acyclovir (ZOVIRAX) 800 MG tablet, as needed., Disp: , Rfl:  .  ALPRAZolam (XANAX) 0.5 MG tablet, TAKE 1 TABLET BY MOUTH 3 TIMES A DAY AS NEEDED FOR ANXIETY., Disp: 90 tablet, Rfl: 2 .  aspirin EC 81 MG tablet, Take 1 tablet (81 mg total) by mouth daily. For heart attack prevention.,  Disp: , Rfl:  .  buPROPion (WELLBUTRIN XL) 300 MG 24 hr tablet, Take 1 tablet (300 mg total) by mouth daily., Disp: 90 tablet, Rfl: 0 .  clotrimazole-betamethasone (LOTRISONE) cream, Apply topically 2 (two) times daily as needed., Disp: 15 g, Rfl: 0 .  lisinopril (PRINIVIL,ZESTRIL) 10 MG tablet, Take 1 tablet (10 mg total) by mouth daily., Disp: 90 tablet, Rfl: 0 .  Multiple Vitamin (MULTIVITAMIN) tablet, Take 1 tablet by mouth daily., Disp: , Rfl:  .  omeprazole (PRILOSEC) 20 MG capsule, Take 1 capsule (20 mg total) by mouth 2 (two) times daily before a meal., Disp: 180 capsule, Rfl: 0 .  simvastatin (ZOCOR) 40 MG tablet, Take 1 tablet (40 mg total) by mouth at bedtime., Disp: 90 tablet, Rfl: 0 .  zolpidem (AMBIEN) 10 MG tablet, Take 1 tablet (10 mg total) by mouth at bedtime., Disp: 30 tablet, Rfl: 2  No Known Allergies  ROS  Constitutional: Negative for fever or weight change.  Respiratory: Negative for cough and shortness of breath.   Cardiovascular: Negative for chest pain or palpitations.  Gastrointestinal: Negative for abdominal pain, no bowel changes.  Musculoskeletal: See HPI Skin: Negative for rash.  Neurological: Negative for dizziness or headache.  No other specific complaints in a complete review of systems (except as listed  in HPI above).  Objective  Vitals:   06/12/17 1345  BP: 120/82  Pulse: 86  Resp: 16  Temp: 97.9 F (36.6 C)  TempSrc: Oral  SpO2: 99%  Weight: 169 lb (76.7 kg)  Height: 5\' 5"  (1.651 m)   Body mass index is 28.12 kg/m.  Nursing Note and Vital Signs reviewed.  Physical Exam  Constitutional: Patient appears well-developed and well-nourished. Obese No distress.  HEENT: head atraumatic, normocephalic Cardiovascular: Normal rate, regular rhythm, S1/S2 present.  No murmur or rub heard. No BLE edema. Pulmonary/Chest: Effort normal and breath sounds clear. No respiratory distress or retractions. Psychiatric: Patient has a normal mood and affect.  behavior is normal. Judgment and thought content normal. Musculoskeletal: Normal range of motion, no joint effusions. No gross deformities. Mild swelling to right foot; moderate tenderness to the posterior and anterior foot - no crepitus; pedal pulse +2; cap refill <3 seconds. No calf tenderness, erythema, or swelling bilaterally Neurological: he is alert and oriented to person, place, and time. No cranial nerve deficit. Coordination, balance, strength, speech and gait are normal.  Skin: Skin is warm and dry. No rash noted. There is erythema to the RIGHT anterior foot and great toe.   No results found for this or any previous visit (from the past 72 hour(s)).  Assessment & Plan  1. Pain and swelling of toe of right foot - predniSONE (DELTASONE) 10 MG tablet; Take 1-6 tablets (10-60 mg total) by mouth daily with breakfast. Day1:6tabs, Day2:5tabs, Day3:4tabs, Day4:3tabs, Day5:2tabs, Day6:1tab  Dispense: 21 tablet; Refill: 0 - Pt has had significant work-up for this swelling and pain in the past, so we will avoid imaging or labs today.  He declines to go to podiatrist today, but will return if prednisone is ineffective.  Advised rest, ice, and to avoid steel toed boots and overuse for the time being.  -Red flags and when to present for emergency care or RTC including fever >101.65F, chest pain, shortness of breath, new/worsening/un-resolving symptoms, calf pain/swelling/redness, reviewed with patient at time of visit. Follow up and care instructions discussed and provided in AVS.

## 2017-06-15 ENCOUNTER — Other Ambulatory Visit: Payer: Self-pay | Admitting: Family Medicine

## 2017-06-15 DIAGNOSIS — I10 Essential (primary) hypertension: Secondary | ICD-10-CM

## 2017-06-15 DIAGNOSIS — K219 Gastro-esophageal reflux disease without esophagitis: Secondary | ICD-10-CM

## 2017-06-15 DIAGNOSIS — E785 Hyperlipidemia, unspecified: Secondary | ICD-10-CM

## 2017-06-15 DIAGNOSIS — F3342 Major depressive disorder, recurrent, in full remission: Secondary | ICD-10-CM

## 2017-06-24 ENCOUNTER — Ambulatory Visit (INDEPENDENT_AMBULATORY_CARE_PROVIDER_SITE_OTHER): Payer: Medicare Other | Admitting: Family Medicine

## 2017-06-24 ENCOUNTER — Encounter: Payer: Self-pay | Admitting: Family Medicine

## 2017-06-24 VITALS — BP 110/62 | HR 87 | Temp 97.6°F | Resp 14 | Wt 165.1 lb

## 2017-06-24 DIAGNOSIS — E785 Hyperlipidemia, unspecified: Secondary | ICD-10-CM | POA: Diagnosis not present

## 2017-06-24 DIAGNOSIS — Z8619 Personal history of other infectious and parasitic diseases: Secondary | ICD-10-CM

## 2017-06-24 DIAGNOSIS — K219 Gastro-esophageal reflux disease without esophagitis: Secondary | ICD-10-CM

## 2017-06-24 DIAGNOSIS — I10 Essential (primary) hypertension: Secondary | ICD-10-CM

## 2017-06-24 DIAGNOSIS — G4700 Insomnia, unspecified: Secondary | ICD-10-CM

## 2017-06-24 DIAGNOSIS — F3342 Major depressive disorder, recurrent, in full remission: Secondary | ICD-10-CM | POA: Diagnosis not present

## 2017-06-24 DIAGNOSIS — F419 Anxiety disorder, unspecified: Secondary | ICD-10-CM

## 2017-06-24 MED ORDER — ZOLPIDEM TARTRATE 10 MG PO TABS: 10.0000 mg | ORAL_TABLET | Freq: Every day | ORAL | 2 refills | Status: DC

## 2017-06-24 MED ORDER — BUPROPION HCL ER (XL) 300 MG PO TB24: 300.0000 mg | ORAL_TABLET | Freq: Every day | ORAL | 0 refills | Status: AC

## 2017-06-24 MED ORDER — ALPRAZOLAM 0.5 MG PO TABS: ORAL_TABLET | ORAL | 2 refills | Status: AC

## 2017-06-24 MED ORDER — VALACYCLOVIR HCL 1 G PO TABS: 1000.0000 mg | ORAL_TABLET | Freq: Two times a day (BID) | ORAL | 0 refills | Status: AC

## 2017-06-24 MED ORDER — LISINOPRIL 10 MG PO TABS: 10.0000 mg | ORAL_TABLET | Freq: Every day | ORAL | 0 refills | Status: DC

## 2017-06-24 MED ORDER — SIMVASTATIN 40 MG PO TABS: 40.0000 mg | ORAL_TABLET | Freq: Every day | ORAL | 0 refills | Status: DC

## 2017-06-24 MED ORDER — OMEPRAZOLE 20 MG PO CPDR
20.0000 mg | DELAYED_RELEASE_CAPSULE | Freq: Two times a day (BID) | ORAL | 0 refills | Status: AC
Start: 2017-06-24 — End: ?

## 2017-06-24 NOTE — Progress Notes (Signed)
Name: Billy Cisneros   MRN: [id number]    DOB: [birth year]   Date:06/24/2017       Progress Note  Subjective  Chief Complaint  Chief Complaint  Patient presents with  . Follow-up  . Medication Refill    Anxiety  Presents for follow-up visit. Symptoms include irritability. Patient reports no chest pain, depressed mood, excessive worry, insomnia, nervous/anxious behavior, palpitations, panic, restlessness or shortness of breath. The severity of symptoms is moderate. The quality of sleep is fair.   His past medical history is significant for depression.  Depression       The patient presents with depression.  This is a recurrent problem.  The onset quality is gradual. The problem is unchanged.  Associated symptoms include no helplessness, no hopelessness, does not have insomnia, no restlessness, no headaches and not sad.  Past treatments include SSRIs - Selective serotonin reuptake inhibitors.  Past medical history includes anxiety and depression.   Hypertension  This is a chronic problem. The problem is unchanged. Associated symptoms include anxiety. Pertinent negatives include no blurred vision, chest pain, headaches, orthopnea, palpitations or shortness of breath. Past treatments include ACE inhibitors. There is no history of kidney disease, CAD/MI or CVA.  Hyperlipidemia  This is a chronic problem. The problem is controlled. Pertinent negatives include no chest pain or shortness of breath. Current antihyperlipidemic treatment includes statins.  Gastroesophageal Reflux  He reports no chest pain, no coughing, no early satiety, no heartburn or no sore throat. This is a chronic problem. The problem has been unchanged.      Past Medical History:  Diagnosis Date  . Anxiety   . Coronary artery disease   . Depression   . GERD (gastroesophageal reflux disease)   . Gout   . Hypertension     Past Surgical History:  Procedure Laterality Date  . CARDIAC SURGERY    . COLONOSCOPY WITH  PROPOFOL N/A 05/25/2017   Procedure: COLONOSCOPY WITH PROPOFOL;  Surgeon: Wyline Mood, MD;  [facility] ENDOSCOPY;  Service: Gastroenterology;  Laterality: N/A;  . CORONARY ARTERY BYPASS GRAFT      Family History  Problem Relation Age of Onset  . Heart attack Mother   . Hyperlipidemia Mother   . Hypertension Mother   . Cancer Father        lung  . Diabetes Sister   . Diabetes Brother   . Asthma Daughter   . Cancer Maternal Grandmother   . Stroke Maternal Grandfather   . Heart attack Paternal Grandfather     Social History   Socioeconomic History  . Marital status: Married    Spouse name: Not on file  . Number of children: Not on file  . Years of education: Not on file  . Highest education level: Not on file  Social Needs  . Financial resource strain: Not on file  . Food insecurity - worry: Not on file  . Food insecurity - inability: Not on file  . Transportation needs - medical: Not on file  . Transportation needs - non-medical: Not on file  Occupational History  . Not on file  Tobacco Use  . Smoking status: Never Smoker  . Smokeless tobacco: Never Used  Substance and Sexual Activity  . Alcohol use: Yes    Alcohol/week: 0.0 oz    Comment: 3 beer a month  . Drug use: Yes    Frequency: 2.0 times per week    Types: Cocaine    Comment: last use 15-20 years per patient  .  Sexual activity: Yes    Birth control/protection: None  Other Topics Concern  . Not on file  Social History Narrative  . Not on file     Current Outpatient Medications:  .  acyclovir (ZOVIRAX) 800 MG tablet, as needed., Disp: , Rfl:  .  ALPRAZolam (XANAX) 0.5 MG tablet, TAKE 1 TABLET BY MOUTH 3 TIMES A DAY AS NEEDED FOR ANXIETY., Disp: 90 tablet, Rfl: 2 .  aspirin EC 81 MG tablet, Take 1 tablet (81 mg total) by mouth daily. For heart attack prevention., Disp: , Rfl:  .  buPROPion (WELLBUTRIN XL) 300 MG 24 hr tablet, Take 1 tablet (300 mg total) by mouth daily., Disp: 90 tablet, Rfl: 0 .   clotrimazole-betamethasone (LOTRISONE) cream, Apply topically 2 (two) times daily as needed., Disp: 15 g, Rfl: 0 .  lisinopril (PRINIVIL,ZESTRIL) 10 MG tablet, Take 1 tablet (10 mg total) by mouth daily., Disp: 90 tablet, Rfl: 0 .  omeprazole (PRILOSEC) 20 MG capsule, Take 1 capsule (20 mg total) by mouth 2 (two) times daily before a meal., Disp: 180 capsule, Rfl: 0 .  simvastatin (ZOCOR) 40 MG tablet, Take 1 tablet (40 mg total) by mouth at bedtime., Disp: 90 tablet, Rfl: 0 .  zolpidem (AMBIEN) 10 MG tablet, Take 1 tablet (10 mg total) by mouth at bedtime., Disp: 30 tablet, Rfl: 2 .  Multiple Vitamin (MULTIVITAMIN) tablet, Take 1 tablet by mouth daily., Disp: , Rfl:  .  predniSONE (DELTASONE) 10 MG tablet, Take 1-6 tablets (10-60 mg total) by mouth daily with breakfast. Day1:6tabs, Day2:5tabs, Day3:4tabs, Day4:3tabs, Day5:2tabs, Day6:1tab (Patient not taking: Reported on 06/24/2017), Disp: 21 tablet, Rfl: 0  No Known Allergies   Review of Systems  Constitutional: Positive for irritability.  HENT: Negative for sore throat.   Eyes: Negative for blurred vision.  Respiratory: Negative for cough and shortness of breath.   Cardiovascular: Negative for chest pain, palpitations and orthopnea.  Gastrointestinal: Negative for heartburn.  Neurological: Negative for headaches.  Psychiatric/Behavioral: Positive for depression. The patient is not nervous/anxious and does not have insomnia.       Objective  Vitals:   06/24/17 0915  BP: 110/62  Pulse: 87  Resp: 14  Temp: 97.6 F (36.4 C)  TempSrc: Oral  SpO2: 98%  Weight: 165 lb 1.6 oz (74.9 kg)    Physical Exam  Constitutional: He is oriented to person, place, and time and well-developed, well-nourished, and in no distress.  HENT:  Head: Normocephalic and atraumatic.  Cardiovascular: Normal rate, regular rhythm and normal heart sounds.  No murmur heard. Pulmonary/Chest: Effort normal and breath sounds normal. He has no wheezes.   Abdominal: Soft. Bowel sounds are normal.  Neurological: He is alert and oriented to person, place, and time.  Psychiatric: Mood, memory, affect and judgment normal.  Nursing note and vitals reviewed.       Assessment & Plan  1. Essential hypertension BP stable on present antihypertensive treatment - lisinopril (PRINIVIL,ZESTRIL) 10 MG tablet; Take 1 tablet (10 mg total) by mouth daily.  Dispense: 90 tablet; Refill: 0  2. Dyslipidemia  - simvastatin (ZOCOR) 40 MG tablet; Take 1 tablet (40 mg total) by mouth at bedtime.  Dispense: 90 tablet; Refill: 0  3. Gastroesophageal reflux disease, esophagitis presence not specified  - omeprazole (PRILOSEC) 20 MG capsule; Take 1 capsule (20 mg total) by mouth 2 (two) times daily before a meal.  Dispense: 180 capsule; Refill: 0  4. Recurrent major depressive disorder, in full remission (HCC)  - buPROPion (  WELLBUTRIN XL) 300 MG 24 hr tablet; Take 1 tablet (300 mg total) by mouth daily.  Dispense: 90 tablet; Refill: 0  5. Insomnia, unspecified type  - zolpidem (AMBIEN) 10 MG tablet; Take 1 tablet (10 mg total) by mouth at bedtime.  Dispense: 30 tablet; Refill: 2  6. Anxiety  - ALPRAZolam (XANAX) 0.5 MG tablet; TAKE 1 TABLET BY MOUTH 3 TIMES A DAY AS NEEDED FOR ANXIETY.  Dispense: 90 tablet; Refill: 2  7. History of herpes genitalis Patient requesting prescription for Valtrex for genital herpes outbreak, refill authorized - valACYclovir (VALTREX) 1000 MG tablet; Take 1 tablet (1,000 mg total) by mouth 2 (two) times daily for 10 days.  Dispense: 20 tablet; Refill: 0   Sadiel Mota Asad A. [facility] 06/24/2017 9:45 AM

## 2017-09-04 ENCOUNTER — Observation Stay (HOSPITAL_COMMUNITY)
Admission: EM | Admit: 2017-09-04 | Discharge: 2017-09-05 | Disposition: A | Discharge status: Home / Self Care | Payer: Medicare Other | Attending: Internal Medicine | Admitting: Internal Medicine

## 2017-09-04 ENCOUNTER — Encounter (HOSPITAL_COMMUNITY): Payer: Self-pay | Admitting: *Deleted

## 2017-09-04 ENCOUNTER — Emergency Department (HOSPITAL_COMMUNITY): Payer: Medicare Other

## 2017-09-04 ENCOUNTER — Other Ambulatory Visit: Payer: Self-pay

## 2017-09-04 DIAGNOSIS — Z951 Presence of aortocoronary bypass graft: Secondary | ICD-10-CM | POA: Diagnosis not present

## 2017-09-04 DIAGNOSIS — Z7982 Long term (current) use of aspirin: Secondary | ICD-10-CM | POA: Diagnosis not present

## 2017-09-04 DIAGNOSIS — N183 Chronic kidney disease, stage 3 unspecified: Secondary | ICD-10-CM | POA: Diagnosis present

## 2017-09-04 DIAGNOSIS — M109 Gout, unspecified: Secondary | ICD-10-CM | POA: Insufficient documentation

## 2017-09-04 DIAGNOSIS — E785 Hyperlipidemia, unspecified: Secondary | ICD-10-CM | POA: Diagnosis present

## 2017-09-04 DIAGNOSIS — R079 Chest pain, unspecified: Secondary | ICD-10-CM | POA: Diagnosis present

## 2017-09-04 DIAGNOSIS — Z9889 Other specified postprocedural states: Secondary | ICD-10-CM | POA: Insufficient documentation

## 2017-09-04 DIAGNOSIS — K219 Gastro-esophageal reflux disease without esophagitis: Secondary | ICD-10-CM | POA: Diagnosis not present

## 2017-09-04 DIAGNOSIS — I2510 Atherosclerotic heart disease of native coronary artery without angina pectoris: Secondary | ICD-10-CM | POA: Diagnosis not present

## 2017-09-04 DIAGNOSIS — Z79899 Other long term (current) drug therapy: Secondary | ICD-10-CM | POA: Insufficient documentation

## 2017-09-04 DIAGNOSIS — I129 Hypertensive chronic kidney disease with stage 1 through stage 4 chronic kidney disease, or unspecified chronic kidney disease: Secondary | ICD-10-CM | POA: Diagnosis not present

## 2017-09-04 DIAGNOSIS — I200 Unstable angina: Secondary | ICD-10-CM

## 2017-09-04 DIAGNOSIS — Z8249 Family history of ischemic heart disease and other diseases of the circulatory system: Secondary | ICD-10-CM | POA: Insufficient documentation

## 2017-09-04 DIAGNOSIS — R0789 Other chest pain: Secondary | ICD-10-CM | POA: Diagnosis not present

## 2017-09-04 DIAGNOSIS — F419 Anxiety disorder, unspecified: Secondary | ICD-10-CM | POA: Diagnosis not present

## 2017-09-04 DIAGNOSIS — I10 Essential (primary) hypertension: Secondary | ICD-10-CM | POA: Diagnosis present

## 2017-09-04 DIAGNOSIS — F329 Major depressive disorder, single episode, unspecified: Secondary | ICD-10-CM | POA: Diagnosis not present

## 2017-09-04 DIAGNOSIS — M25512 Pain in left shoulder: Secondary | ICD-10-CM | POA: Diagnosis not present

## 2017-09-04 LAB — BASIC METABOLIC PANEL
Anion gap: 11 (ref 5–15)
BUN: 26 mg/dL — AB (ref 6–20)
CALCIUM: 9 mg/dL (ref 8.9–10.3)
CO2: 22 mmol/L (ref 22–32)
CREATININE: 1.69 mg/dL — AB (ref 0.61–1.24)
Chloride: 106 mmol/L (ref 101–111)
GFR calc Af Amer: 47 mL/min — ABNORMAL LOW (ref >60)
GFR, EST NON AFRICAN AMERICAN: 41 mL/min — AB (ref >60)
Glucose, Bld: 73 mg/dL (ref 65–99)
POTASSIUM: 4.1 mmol/L (ref 3.5–5.1)
SODIUM: 139 mmol/L (ref 135–145)

## 2017-09-04 LAB — CBC
HCT: 40.6 % (ref 39.0–52.0)
Hemoglobin: 13.8 g/dL (ref 13.0–17.0)
MCH: 30.7 pg (ref 26.0–34.0)
MCHC: 34 g/dL (ref 30.0–36.0)
MCV: 90.2 fL (ref 78.0–100.0)
Platelets: 244 10*3/uL (ref 150–400)
RBC: 4.5 MIL/uL (ref 4.22–5.81)
RDW: 13.5 % (ref 11.5–15.5)
WBC: 7.2 10*3/uL (ref 4.0–10.5)

## 2017-09-04 LAB — I-STAT TROPONIN, ED
TROPONIN I, POC: 0 ng/mL (ref 0.00–0.08)
Troponin i, poc: 0 ng/mL (ref 0.00–0.08)

## 2017-09-04 MED ORDER — ASPIRIN 81 MG PO CHEW
324.0000 mg | CHEWABLE_TABLET | Freq: Once | ORAL | Status: AC
  Administered 2017-09-05: 324 mg via ORAL
  Filled 2017-09-04: qty 4

## 2017-09-04 MED ORDER — SODIUM CHLORIDE 0.9 % IV SOLN: INTRAVENOUS | Status: DC

## 2017-09-04 MED ORDER — ASPIRIN 81 MG PO CHEW: 324.0000 mg | CHEWABLE_TABLET | Freq: Once | ORAL | Status: DC

## 2017-09-04 MED ORDER — HEPARIN (PORCINE) IN NACL 100-0.45 UNIT/ML-% IJ SOLN
1000.0000 [IU]/h | INTRAMUSCULAR | Status: DC
  Administered 2017-09-05: 1000 [IU]/h via INTRAVENOUS
  Filled 2017-09-04 (×2): qty 250

## 2017-09-04 MED ORDER — NITROGLYCERIN 0.4 MG SL SUBL
0.4000 mg | SUBLINGUAL_TABLET | SUBLINGUAL | Status: DC | PRN
  Administered 2017-09-05 (×2): 0.4 mg via SUBLINGUAL
  Filled 2017-09-04: qty 1

## 2017-09-04 MED ORDER — ASPIRIN 300 MG RE SUPP: 300.0000 mg | Freq: Once | RECTAL | Status: DC

## 2017-09-04 MED ORDER — HEPARIN BOLUS VIA INFUSION
4000.0000 [IU] | Freq: Once | INTRAVENOUS | Status: AC
  Administered 2017-09-05: 4000 [IU] via INTRAVENOUS
  Filled 2017-09-04: qty 4000

## 2017-09-04 NOTE — ED Triage Notes (Signed)
Pt in c/o mid chest tightness that radiates to L shoulder onset x 3 days, pt hx of CABG in 2000, pt denies n/v/d, pt A&O x4

## 2017-09-05 ENCOUNTER — Observation Stay (HOSPITAL_BASED_OUTPATIENT_CLINIC_OR_DEPARTMENT_OTHER): Payer: Medicare Other

## 2017-09-05 ENCOUNTER — Other Ambulatory Visit: Payer: Self-pay

## 2017-09-05 DIAGNOSIS — Z951 Presence of aortocoronary bypass graft: Secondary | ICD-10-CM | POA: Diagnosis not present

## 2017-09-05 DIAGNOSIS — N183 Chronic kidney disease, stage 3 unspecified: Secondary | ICD-10-CM | POA: Diagnosis present

## 2017-09-05 DIAGNOSIS — I200 Unstable angina: Secondary | ICD-10-CM

## 2017-09-05 DIAGNOSIS — E785 Hyperlipidemia, unspecified: Secondary | ICD-10-CM | POA: Diagnosis not present

## 2017-09-05 DIAGNOSIS — R079 Chest pain, unspecified: Secondary | ICD-10-CM

## 2017-09-05 DIAGNOSIS — I10 Essential (primary) hypertension: Secondary | ICD-10-CM

## 2017-09-05 DIAGNOSIS — F419 Anxiety disorder, unspecified: Secondary | ICD-10-CM | POA: Diagnosis not present

## 2017-09-05 LAB — LIPID PANEL
CHOLESTEROL: 173 mg/dL (ref 0–200)
HDL: 39 mg/dL — ABNORMAL LOW (ref >40)
LDL Cholesterol: 94 mg/dL (ref 0–99)
Total CHOL/HDL Ratio: 4.4 RATIO
Triglycerides: 201 mg/dL — ABNORMAL HIGH (ref <150)
VLDL: 40 mg/dL (ref 0–40)

## 2017-09-05 LAB — CBC
HCT: 40.2 % (ref 39.0–52.0)
Hemoglobin: 13.8 g/dL (ref 13.0–17.0)
MCH: 30.9 pg (ref 26.0–34.0)
MCHC: 34.3 g/dL (ref 30.0–36.0)
MCV: 89.9 fL (ref 78.0–100.0)
PLATELETS: 210 10*3/uL (ref 150–400)
RBC: 4.47 MIL/uL (ref 4.22–5.81)
RDW: 13.8 % (ref 11.5–15.5)
WBC: 5.7 10*3/uL (ref 4.0–10.5)

## 2017-09-05 LAB — HEPARIN LEVEL (UNFRACTIONATED)
HEPARIN UNFRACTIONATED: 0.65 [IU]/mL (ref 0.30–0.70)
Heparin Unfractionated: 0.55 IU/mL (ref 0.30–0.70)

## 2017-09-05 LAB — APTT: APTT: 29 s (ref 24–36)

## 2017-09-05 LAB — TROPONIN I
Troponin I: <0.03 ng/mL (ref <0.03)
Troponin I: <0.03 ng/mL (ref <0.03)

## 2017-09-05 LAB — PROTIME-INR
INR: 1.03
PROTHROMBIN TIME: 13.4 s (ref 11.4–15.2)

## 2017-09-05 LAB — MRSA PCR SCREENING: MRSA BY PCR: NEGATIVE

## 2017-09-05 MED ORDER — GI COCKTAIL ~~LOC~~: 30.0000 mL | Freq: Four times a day (QID) | ORAL | Status: DC | PRN

## 2017-09-05 MED ORDER — REGADENOSON 0.4 MG/5ML IV SOLN
INTRAVENOUS | Status: AC
  Administered 2017-09-05: 0.4 mg via INTRAVENOUS
  Filled 2017-09-05: qty 5

## 2017-09-05 MED ORDER — ONDANSETRON HCL 4 MG/2ML IJ SOLN: 4.0000 mg | Freq: Four times a day (QID) | INTRAMUSCULAR | Status: DC | PRN

## 2017-09-05 MED ORDER — SIMVASTATIN 40 MG PO TABS
40.0000 mg | ORAL_TABLET | Freq: Every day | ORAL | Status: DC
  Administered 2017-09-05: 40 mg via ORAL
  Filled 2017-09-05: qty 1

## 2017-09-05 MED ORDER — TECHNETIUM TC 99M TETROFOSMIN IV KIT
30.0000 | PACK | Freq: Once | INTRAVENOUS | Status: AC | PRN
  Administered 2017-09-05: 30 via INTRAVENOUS

## 2017-09-05 MED ORDER — BUPROPION HCL ER (XL) 300 MG PO TB24
300.0000 mg | ORAL_TABLET | Freq: Every day | ORAL | Status: DC
  Administered 2017-09-05: 300 mg via ORAL
  Filled 2017-09-05: qty 1

## 2017-09-05 MED ORDER — TECHNETIUM TC 99M TETROFOSMIN IV KIT
10.0000 | PACK | Freq: Once | INTRAVENOUS | Status: AC | PRN
  Administered 2017-09-05: 10 via INTRAVENOUS

## 2017-09-05 MED ORDER — ALPRAZOLAM 0.5 MG PO TABS
0.5000 mg | ORAL_TABLET | Freq: Three times a day (TID) | ORAL | Status: DC | PRN
Start: 2017-09-05 — End: 2017-09-05
  Administered 2017-09-05: 0.5 mg via ORAL
  Filled 2017-09-05: qty 1

## 2017-09-05 MED ORDER — SODIUM CHLORIDE 0.9 % IV SOLN
INTRAVENOUS | Status: DC
  Administered 2017-09-05: 03:00:00 via INTRAVENOUS

## 2017-09-05 MED ORDER — PANTOPRAZOLE SODIUM 40 MG PO TBEC
40.0000 mg | DELAYED_RELEASE_TABLET | Freq: Two times a day (BID) | ORAL | Status: DC
  Administered 2017-09-05 (×2): 40 mg via ORAL
  Filled 2017-09-05 (×2): qty 1

## 2017-09-05 MED ORDER — ACETAMINOPHEN 325 MG PO TABS: 650.0000 mg | ORAL_TABLET | ORAL | Status: DC | PRN

## 2017-09-05 MED ORDER — LISINOPRIL 10 MG PO TABS
10.0000 mg | ORAL_TABLET | Freq: Every day | ORAL | Status: DC
  Administered 2017-09-05: 10 mg via ORAL
  Filled 2017-09-05: qty 1

## 2017-09-05 MED ORDER — REGADENOSON 0.4 MG/5ML IV SOLN
0.4000 mg | Freq: Once | INTRAVENOUS | Status: AC
  Administered 2017-09-05: 0.4 mg via INTRAVENOUS

## 2017-09-05 MED ORDER — MORPHINE SULFATE (PF) 4 MG/ML IV SOLN
2.0000 mg | INTRAVENOUS | Status: DC | PRN
Start: 2017-09-05 — End: 2017-09-05

## 2017-09-05 MED ORDER — ZOLPIDEM TARTRATE 5 MG PO TABS
5.0000 mg | ORAL_TABLET | Freq: Every day | ORAL | Status: DC
  Administered 2017-09-05: 5 mg via ORAL
  Filled 2017-09-05: qty 1

## 2017-09-05 NOTE — Progress Notes (Signed)
Received word from Dr. Mayford Knife that she read patient's nuc and it was normal. Per Dr. Rennis Golden, OK to dc home. Spoke with patient who is pleased to hear news and feeling good, eager to eat. I have sent a message to our office's scheduling team requesting a follow-up appointment, and our office will call the patient with this information (pt aware he may meet one of Dr. Blanchie Dessert care team APPs). Also notified Dr. Roda Shutters.  Kriste Basque Dunn PA-C

## 2017-09-05 NOTE — ED Notes (Signed)
Patient still off the floor for scan,

## 2017-09-05 NOTE — Progress Notes (Signed)
ANTICOAGULATION CONSULT NOTE - Follow Up Consult  Pharmacy Consult for Heparin  Indication: chest pain/ACS  No Known Allergies  Patient Measurements: Height:  (165.1 cm) Weight: 164 lb (74.4 kg) IBW/kg (Calculated) : 61.5  Vital Signs: Temp: 98.4 F (36.9 C) (04/27 1535) Temp Source: Oral (04/27 1535) BP: 116/75 (04/27 1535) Pulse Rate: 77 (04/27 1535)  Labs: Recent Labs    09/04/17 1757 09/04/17 2338 09/04/17 2351 09/05/17 0245 09/05/17 0423 09/05/17 0755 09/05/17 1349  HGB 13.8  --   --   --   --  13.8  --   HCT 40.6  --   --   --   --  40.2  --   PLT 244  --   --   --   --  210  --   APTT  --  29  --   --   --   --   --   LABPROT  --   --  13.4  --   --   --   --   INR  --   --  1.03  --   --   --   --   HEPARINUNFRC  --   --   --   --   --  0.65 0.55  CREATININE 1.69*  --   --   --   --   --   --   TROPONINI  --   --   --  <0.03 <0.03 <0.03  --     Estimated Creatinine Clearance: 41.1 mL/min (A) (by C-G formula based on SCr of 1.69 mg/dL (H)).   Medical History: Past Medical History:  Diagnosis Date  . Anxiety   . Coronary artery disease   . Depression   . GERD (gastroesophageal reflux disease)   . Gout   . Hypertension     Assessment: 67 y/o M to the ED with radiating chest tightness. On heparin gtt for ACS. Repeat HL remains therapeutic at 0.55. Nuc med study was normal today and patient is to be discharged today. IV heparin has been stopped per RN   Goal of Therapy:  Heparin level 0.3-0.7 units/ml Monitor platelets by anticoagulation protocol: Yes   Plan:  Discharge today D/c IV heparin   Vinnie Level, PharmD., BCPS Clinical Pharmacist If after 3:30pm, please call main pharmacy at: [phone]

## 2017-09-05 NOTE — Progress Notes (Signed)
ANTICOAGULATION CONSULT NOTE - Initial Consult  Pharmacy Consult for Heparin  Indication: chest pain/ACS  No Known Allergies  Patient Measurements: Height:  (165.1 cm) Weight: 164 lb (74.4 kg) IBW/kg (Calculated) : 61.5  Vital Signs: Temp: 98.3 F (36.8 C) (04/26 2136) BP: 98/69 (04/27 0900) Pulse Rate: 76 (04/27 0915)  Labs: Recent Labs    09/04/17 1757 09/04/17 2338 09/04/17 2351 09/05/17 0245 09/05/17 0423 09/05/17 0755  HGB 13.8  --   --   --   --  13.8  HCT 40.6  --   --   --   --  40.2  PLT 244  --   --   --   --  210  APTT  --  29  --   --   --   --   LABPROT  --   --  13.4  --   --   --   INR  --   --  1.03  --   --   --   HEPARINUNFRC  --   --   --   --   --  0.65  CREATININE 1.69*  --   --   --   --   --   TROPONINI  --   --   --  <0.03 <0.03  --     Estimated Creatinine Clearance: 41.1 mL/min (A) (by C-G formula based on SCr of 1.69 mg/dL (H)).   Medical History: Past Medical History:  Diagnosis Date  . Anxiety   . Coronary artery disease   . Depression   . GERD (gastroesophageal reflux disease)   . Gout   . Hypertension     Assessment: 67 y/o M to the ED with radiating chest tightness. On heparin gtt for ACS. First heparin level is therapeutic at 0.65. CBC stable.  Goal of Therapy:  Heparin level 0.3-0.7 units/ml Monitor platelets by anticoagulation protocol: Yes   Plan:  Continue heparin drip at 1,000 units/hr Check confirmatory level Monitor daily heparin level, CBC, s/s of bleed  Enzo Bi, PharmD, [facility] Clinical Pharmacist Pager [phone] 09/05/2017 9:27 AM

## 2017-09-05 NOTE — Discharge Summary (Signed)
Discharge Summary  Billy Cisneros:[id number] DOB: [birth year]  PCP: Lynnea Ferrier, MD  Admit date: 09/04/2017 Discharge date: 09/05/2017  Time spent: <47mins  Recommendations for Outpatient Follow-up:  1. F/u with PMD within a week  for hospital discharge follow up, repeat cbc/bmp at follow up 2. F/u with rheumatology as already scheduled  3. F/u with cardiology  Discharge Diagnoses:  Active Hospital Problems   Diagnosis Date Noted  . Chest pain 12/04/2014  . CKD (chronic kidney disease), stage III (HCC) 09/05/2017  . Dyslipidemia 03/12/2015  . H/O coronary artery bypass surgery 12/11/2014  . Hypertension 12/04/2014  . Anxiety 12/04/2014    Resolved Hospital Problems  No resolved problems to display.    Discharge Condition: stable  Diet recommendation: heart healthy  Filed Weights   09/04/17 1804  Weight: 74.4 kg (164 lb)    History of present illness: (per admitting MD Dr Katrinka Blazing) Patient coming from: Home  Chief Complaint: Chest Pain  I have personally briefly reviewed patient's old medical records in [facility]   HPI: Billy Cisneros is a 67 y.o. male with medical history significant of HTN, CAD s/p 5v CABG in 2000, anxiety, gout, and GERD; who presents with complaints of left-sided chest pain with exertion over the last 3-4 days.  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  However,  pain seem to radiate into his neck and down his left arm.  Notes associated symptoms of numbness in his lips and fingertips, intermittent diaphoresis, lightheadedness, shortness of breath especially worsened with any exertion.  Denies having any nausea, vomiting, or loss of consciousness.  Patient's last cardiac cath was approximately 5 or 6 years ago.  States symptoms feel similar to his previous heart attack which made him come in for further evaluation.   Pain is not reproducible on palpation.   ED Course: Upon admission into the emergency department patient was noted to be afebrile, respirations 13-24, blood pressure 99/69-172/75, and O2 saturation maintained on room air.  Labs revealed BUN 26, creatinine 1.69, troponin.  Chest x-ray showed no acute abnormalities.  Patient was given aspirin and nitroglycerin in the ED and placed on a heparin drip due to concern for ACS. [facility] called to admit.  He still continues to complain of chest pain.    Hospital Course:  Principal Problem:   Chest pain Active Problems:   Hypertension   Anxiety   H/O coronary artery bypass surgery   Dyslipidemia   CKD (chronic kidney disease), stage III (HCC)   Chest pain radiating to left jaw -With h/o CAD s/p 5 vessel CABG in 2000 -he was started on heparin drip on admission, troponin negative x3, ekg no acute changes -cardiology consulted, patient underwent stress test today which unremarkable, he is cleared to discharge home by cardiology -he is to follow up with cardiology  -patient already has an appointment with rheumatology for left sided shoulder pain    Chronic kidney disease stage III: Chronic.  Initial creatinine noted to be 1.6 and BUN 26 to possibly set [place] some signs of dehydration.  Baseline creatinine appears to range from 1.5-1.7.   Essential hypertension  - Continue lisinopril   Anxiety  - continue Wellbutrin and Xanax prn  Dyslipidemia  - continue simvastatin  DVT prophylaxis: Heparin Code Status: Full  Family Communication: Discussed plan of care with the patient and family  present at bedside Disposition Plan: home  Consults called: cardiology     Procedures:  nuc stress test    Discharge Exam: BP 116/75 (BP Location: Right Arm)   Pulse 77   Temp 98.4 F (36.9 C) (Oral)   Resp 14   Ht  (1.651 m)   Wt 74.4 kg (164 lb)   SpO2 99%   BMI 27.29 kg/m   General: NAD Cardiovascular: RRR Respiratory:  CTABL  Discharge Instructions You were cared for by a hospitalist during your hospital stay. If you have any questions about your discharge medications or the care you received while you were in the hospital after you are discharged, you can call the unit and asked to speak with the hospitalist on call if the hospitalist that took care of you is not available. Once you are discharged, your primary care physician will handle any further medical issues. Please note that NO REFILLS for any discharge medications will be authorized once you are discharged, as it is imperative that you return to your primary care physician (or establish a relationship with a primary care physician if you do not have one) for your aftercare needs so that they can reassess your need for medications and monitor your lab values.  Discharge Instructions    Diet - low sodium heart healthy   Complete by:  As directed    Increase activity slowly   Complete by:  As directed      Allergies as of 09/05/2017   No Known Allergies     Medication List    STOP taking these medications   clotrimazole-betamethasone cream Commonly known as:  LOTRISONE     TAKE these medications   ALPRAZolam 0.5 MG tablet Commonly known as:  XANAX TAKE 1 TABLET BY MOUTH 3 TIMES A DAY AS NEEDED FOR ANXIETY.   aspirin EC 81 MG tablet Take 1 tablet (81 mg total) by mouth daily. For heart attack prevention.   buPROPion 300 MG 24 hr tablet Commonly known as:  WELLBUTRIN XL Take 1 tablet (300 mg total) by mouth daily.   lisinopril 10 MG tablet Commonly known as:  PRINIVIL,ZESTRIL Take 1 tablet (10 mg total) by mouth daily.   omeprazole 20 MG capsule Commonly known as:  PRILOSEC Take 1 capsule (20 mg total) by mouth 2 (two) times daily before a meal.   simvastatin 40 MG tablet Commonly known as:  ZOCOR Take 1 tablet (40 mg total) by mouth at bedtime.   valACYclovir 1000 MG tablet Commonly known as:  VALTREX Take 1,000 mg by mouth 2  (two) times daily as needed (outbreak).   zolpidem 10 MG tablet Commonly known as:  AMBIEN Take 1 tablet (10 mg total) by mouth at bedtime.      No Known Allergies Follow-up Information    Hilty, Lisette Abu, MD Follow up.   Specialty:  Cardiology Why:  Our office will call you for a follow-up appointment. Please call the office if you have not heard from [place] within 3 days. Contact information: [address] [place] [address] [phone]        Curtis Sites III, MD Follow up in 1 week(s).   Specialty:  Internal Medicine Why:  hospital discharge follow up Contact information: [address] [facility] [place] [address] [phone]            The results of significant diagnostics from this hospitalization (including imaging, microbiology, ancillary and laboratory) are listed below  for reference.    Significant Diagnostic Studies: Dg Chest 2 View  Result Date: 09/04/2017 CLINICAL DATA:  Chest tightness EXAM: CHEST - 2 VIEW COMPARISON:  09/09/2012 FINDINGS: Post sternotomy changes. No focal opacity or pleural effusion. Stable cardiomediastinal silhouette. No pneumothorax. IMPRESSION: No active cardiopulmonary disease. Electronically Signed   By: Jasmine Pang M.D.   On: 09/04/2017 19:25   Nm Myocar Multi W/spect W/wall Motion / Ef  Result Date: 09/05/2017  There was no ST segment deviation noted during stress.  The study is normal.  This is a low risk study.  The left ventricular ejection fraction is hyperdynamic (>65%).  Nuclear stress EF: 68%.     Microbiology: Recent Results (from the past 240 hour(s))  MRSA PCR Screening     Status: None   Collection Time: 09/05/17 12:19 PM  Result Value Ref Range Status   MRSA by PCR NEGATIVE NEGATIVE Final    Comment:        The GeneXpert MRSA Assay (FDA approved for NASAL specimens only), is one component of a comprehensive MRSA colonization surveillance program. It is  not intended to diagnose MRSA infection nor to guide or monitor treatment for MRSA infections. Performed at [facility], [address], [place], [place] [address]      Labs: Basic Metabolic Panel: Recent Labs  Lab 09/04/17 1757  NA 139  K 4.1  CL 106  CO2 22  GLUCOSE 73  BUN 26*  CREATININE 1.69*  CALCIUM 9.0   Liver Function Tests: No results for input(s): AST, ALT, ALKPHOS, BILITOT, PROT, ALBUMIN in the last 168 hours. No results for input(s): LIPASE, AMYLASE in the last 168 hours. No results for input(s): AMMONIA in the last 168 hours. CBC: Recent Labs  Lab 09/04/17 1757 09/05/17 0755  WBC 7.2 5.7  HGB 13.8 13.8  HCT 40.6 40.2  MCV 90.2 89.9  PLT 244 210   Cardiac Enzymes: Recent Labs  Lab 09/05/17 0245 09/05/17 0423 09/05/17 0755  TROPONINI <0.03 <0.03 <0.03   BNP: BNP (last 3 results) No results for input(s): BNP in the last 8760 hours.  ProBNP (last 3 results) No results for input(s): PROBNP in the last 8760 hours.  CBG: No results for input(s): GLUCAP in the last 168 hours.     Signed:  Albertine Grates MD, PhD  [facility] 09/05/2017, 4:09 PM

## 2017-09-05 NOTE — ED Notes (Signed)
Advised patient and family of update,

## 2017-09-05 NOTE — ED Notes (Signed)
Nurse will draw labs. 

## 2017-09-05 NOTE — Progress Notes (Signed)
Discharge instructions provided to patient and wife. Copies of AVS given. PIV's removed. Overall Chest Pain is better, not fully resolved. 3-4/10 still pressure. Patient understands results and information provided from Cardiology and Internal Medicine. Wife at bedside during instructions both verbalized understanding. All belongs packed to be sent with patient home. Wheelchair to private vehicle.  CMT made aware of discharge.

## 2017-09-05 NOTE — ED Notes (Signed)
Patient given a urinal.

## 2017-09-05 NOTE — Consult Note (Addendum)
CONSULTATION NOTE   Patient Name: Billy Cisneros Date of Encounter: 09/05/2017 Cardiologist: Isaias Cowman, MD  Chief Complaint   Chest pain  Patient Profile   67 year old patient of Dr. Saralyn Pilar with a history of 5 vessel CABG in 2000 (LIMA to LAD, RIMA to RCA, SVG to OM1, diagonal and ramus), patent grafts by catheterization 2011, presents with chest pain.  HPI   Billy Cisneros is a 67 y.o. male who is being seen today for the evaluation of chest pain at the request of Dr. Erlinda Hong.  This is a 67 year old  patient of Dr. Saralyn Pilar with a history of 5 vessel CABG in 2000 (LIMA to LAD, RIMA to RCA, SVG to OM1, diagonal and ramus), patent grafts by catheterization 2011, presents with chest pain.  He was last seen in the office in 2016.  He had a stress test in 2014 which showed no reversible ischemia at [facility].  He also has a history of hypertension, anxiety, gout and GERD.  He presented with left-sided chest pain that was exertional over the past 3 to 4 days.  He has been working mowing lawns.  He noted pain that radiated to his neck and down his left arm.  Blood pressure was initially elevated in the emergency department.  Creatinine is elevated 1.69.  Troponin was negative overnight x4.  Lipid profile showed total cholesterol 173, HDL 39, LDL 94 and triglycerides 201.  Chest x-ray showed no acute cardiopulmonary disease.  EKG shows normal sinus rhythm without ischemia, personally reviewed.  PMHx   Past Medical History:  Diagnosis Date  . Anxiety   . Coronary artery disease   . Depression   . GERD (gastroesophageal reflux disease)   . Gout   . Hypertension     Past Surgical History:  Procedure Laterality Date  . CARDIAC SURGERY    . COLONOSCOPY WITH PROPOFOL N/A 05/25/2017   Procedure: COLONOSCOPY WITH PROPOFOL;  Surgeon: Jonathon Bellows, MD;  Location: [facility] ENDOSCOPY;  Service: Gastroenterology;  Laterality: N/A;  . CORONARY ARTERY BYPASS GRAFT       FAMHx   Family History  Problem Relation Age of Onset  . Heart attack Mother   . Hyperlipidemia Mother   . Hypertension Mother   . Cancer Father        lung  . Diabetes Sister   . Diabetes Brother   . Asthma Daughter   . Cancer Maternal Grandmother   . Stroke Maternal Grandfather   . Heart attack Paternal Grandfather     SOCHx    reports that he has never smoked. He has never used smokeless tobacco. He reports that he drinks alcohol. He reports that he has current or past drug history. Drug: Cocaine. Frequency: 2.00 times per week.  Outpatient Medications   No current facility-administered medications on file prior to encounter.    Current Outpatient Medications on File Prior to Encounter  Medication Sig Dispense Refill  . ALPRAZolam (XANAX) 0.5 MG tablet TAKE 1 TABLET BY MOUTH 3 TIMES A DAY AS NEEDED FOR ANXIETY. 90 tablet 2  . aspirin EC 81 MG tablet Take 1 tablet (81 mg total) by mouth daily. For heart attack prevention.    Marland Kitchen buPROPion (WELLBUTRIN XL) 300 MG 24 hr tablet Take 1 tablet (300 mg total) by mouth daily. 90 tablet 0  . lisinopril (PRINIVIL,ZESTRIL) 10 MG tablet Take 1 tablet (10 mg total) by mouth daily. 90 tablet 0  . omeprazole (PRILOSEC) 20 MG capsule Take 1  capsule (20 mg total) by mouth 2 (two) times daily before a meal. 180 capsule 0  . simvastatin (ZOCOR) 40 MG tablet Take 1 tablet (40 mg total) by mouth at bedtime. 90 tablet 0  . valACYclovir (VALTREX) 1000 MG tablet Take 1,000 mg by mouth 2 (two) times daily as needed (outbreak).    Marland Kitchen zolpidem (AMBIEN) 10 MG tablet Take 1 tablet (10 mg total) by mouth at bedtime. 30 tablet 2  . clotrimazole-betamethasone (LOTRISONE) cream Apply topically 2 (two) times daily as needed. (Patient not taking: Reported on 09/04/2017) 15 g 0    Inpatient Medications    Scheduled Meds: . buPROPion  300 mg Oral Daily  . lisinopril  10 mg Oral Daily  . pantoprazole  40 mg Oral BID  . simvastatin  40 mg Oral QHS  .  zolpidem  5 mg Oral QHS    Continuous Infusions: . sodium chloride Stopped (09/05/17 0252)  . sodium chloride 75 mL/hr at 09/05/17 0251  . heparin 1,000 Units/hr (09/05/17 0027)    PRN Meds: acetaminophen, ALPRAZolam, gi cocktail, morphine injection, nitroGLYCERIN, ondansetron (ZOFRAN) IV   ALLERGIES   No Known Allergies  ROS   Pertinent items noted in HPI and remainder of comprehensive ROS otherwise negative.  Vitals   Vitals:   09/05/17 0445 09/05/17 0530 09/05/17 0600 09/05/17 0715  BP: 106/69 103/64 100/72 92/65  Pulse: 80 71 72 73  Resp: '13 16 15 14  '$ Temp:      TempSrc:      SpO2: 99% 96% 98% 99%  Weight:      Height:        Intake/Output Summary (Last 24 hours) at 09/05/2017 0821 Last data filed at 09/05/2017 0527 Gross per 24 hour  Intake -  Output 700 ml  Net -700 ml   Filed Weights   09/04/17 1804  Weight: 164 lb (74.4 kg)    Physical Exam   General appearance: alert and no distress Neck: no carotid bruit, no JVD and thyroid not enlarged, symmetric, no tenderness/mass/nodules Lungs: clear to auscultation bilaterally Heart: regular rate and rhythm, S1, S2 normal, no murmur, click, rub or gallop Abdomen: soft, non-tender; bowel sounds normal; no masses,  no organomegaly Extremities: extremities normal, atraumatic, no cyanosis or edema Pulses: 2+ and symmetric Skin: Skin color, texture, turgor normal. No rashes or lesions Neurologic: Grossly normal Psych: Pleasant  Labs   Results for orders placed or performed during the hospital encounter of 09/04/17 (from the past 48 hour(s))  Basic metabolic panel     Status: Abnormal   Collection Time: 09/04/17  5:57 PM  Result Value Ref Range   Sodium 139 135 - 145 mmol/L   Potassium 4.1 3.5 - 5.1 mmol/L   Chloride 106 101 - 111 mmol/L   CO2 22 22 - 32 mmol/L   Glucose, Bld 73 65 - 99 mg/dL   BUN 26 (H) 6 - 20 mg/dL   Creatinine, Ser 1.69 (H) 0.61 - 1.24 mg/dL   Calcium 9.0 8.9 - 10.3 mg/dL   GFR calc  non Af Amer 41 (L) >60 mL/min   GFR calc Af Amer 47 (L) >60 mL/min    Comment: (NOTE) The eGFR has been calculated using the CKD EPI equation. This calculation has not been validated in all clinical situations. eGFR's persistently <60 mL/min signify possible Chronic Kidney Disease.    Anion gap 11 5 - 15    Comment: Performed at [facility] [address], [place], [place]  [address]  CBC     Status: None   Collection Time: 09/04/17  5:57 PM  Result Value Ref Range   WBC 7.2 4.0 - 10.5 K/uL   RBC 4.50 4.22 - 5.81 MIL/uL   Hemoglobin 13.8 13.0 - 17.0 g/dL   HCT 40.6 39.0 - 52.0 %   MCV 90.2 78.0 - 100.0 fL   MCH 30.7 26.0 - 34.0 pg   MCHC 34.0 30.0 - 36.0 g/dL   RDW 13.5 11.5 - 15.5 %   Platelets 244 150 - 400 K/uL    Comment: Performed at [facility] [address] [place], [place] [address]  I-stat troponin, ED     Status: None   Collection Time: 09/04/17  6:19 PM  Result Value Ref Range   Troponin i, poc 0.00 0.00 - 0.08 ng/mL   Comment 3            Comment: Due to the release kinetics of cTnI, a negative result within the first hours of the onset of symptoms does not rule out myocardial infarction with certainty. If myocardial infarction is still suspected, repeat the test at appropriate intervals.   I-stat troponin, ED     Status: None   Collection Time: 09/04/17 11:08 PM  Result Value Ref Range   Troponin i, poc 0.00 0.00 - 0.08 ng/mL   Comment 3            Comment: Due to the release kinetics of cTnI, a negative result within the first hours of the onset of symptoms does not rule out myocardial infarction with certainty. If myocardial infarction is still suspected, repeat the test at appropriate intervals.   APTT     Status: None   Collection Time: 09/04/17 11:38 PM  Result Value Ref Range   aPTT 29 24 - 36 seconds    Comment: Performed at [facility] [address] [place], [place] [address]  Lipid panel     Status: Abnormal    Collection Time: 09/04/17 11:38 PM  Result Value Ref Range   Cholesterol 173 0 - 200 mg/dL   Triglycerides 201 (H) <150 mg/dL   HDL 39 (L) >40 mg/dL   Total CHOL/HDL Ratio 4.4 RATIO   VLDL 40 0 - 40 mg/dL   LDL Cholesterol 94 0 - 99 mg/dL    Comment:        Total Cholesterol/HDL:CHD Risk Coronary Heart Disease Risk Table                     Men   Women  1/2 Average Risk   3.4   3.3  Average Risk       5.0   4.4  2 X Average Risk   9.6   7.1  3 X Average Risk  23.4   11.0        Use the calculated Patient Ratio above and the CHD Risk Table to determine the patient's CHD Risk.        ATP III CLASSIFICATION (LDL):  <100     mg/dL   Optimal  100-129  mg/dL   Near or Above                    Optimal  130-159  mg/dL   Borderline  160-189  mg/dL   High  >190     mg/dL   Very High Performed at [facility] [address], [place], [place] [address]  Protime-INR     Status: None   Collection Time: 09/04/17 11:51 PM  Result Value Ref Range   Prothrombin Time 13.4 11.4 - 15.2 seconds   INR 1.03     Comment: Performed at [facility], [place] [address] [place], [place] [address]  Troponin I-serum (0, 3, 6 hours)     Status: None   Collection Time: 09/05/17  2:45 AM  Result Value Ref Range   Troponin I <0.03 <0.03 ng/mL    Comment: Performed at [facility] [address], [place], [place] [address]  Troponin I-serum (0, 3, 6 hours)     Status: None   Collection Time: 09/05/17  4:23 AM  Result Value Ref Range   Troponin I <0.03 <0.03 ng/mL    Comment: Performed at [facility] [address] [place], [place] [address]    ECG   NSR without ischemia - Personally Reviewed  Telemetry   Sinus rhythm - Personally Reviewed  Radiology   Dg Chest 2 View  Result Date: 09/04/2017 CLINICAL DATA:  Chest tightness EXAM: CHEST - 2 VIEW COMPARISON:  09/09/2012 FINDINGS: Post sternotomy changes. No focal opacity or pleural effusion. Stable cardiomediastinal  silhouette. No pneumothorax. IMPRESSION: No active cardiopulmonary disease. Electronically Signed   By: Donavan Foil M.D.   On: 09/04/2017 19:25    Cardiac Studies   N/A  Impression   Principal Problem:   Chest pain Active Problems:   Hypertension   Anxiety   H/O coronary artery bypass surgery   Dyslipidemia   CKD (chronic kidney disease), stage III ([facility])   Recommendation   1. [name] is describing chest pain which is somewhat atypical.  He reports a numbness and sharp pain which travels across the anterior chest and left shoulder up the back of his neck and left jaw.  He also reports a heaviness in the left anterior chest wall.  Is been present for couple days and is associated with some shortness of breath.  He is been having this pain which sounds neuropathic for some time and is scheduled to see a rheumatologist.  Also wonder if it may be a cervicalgia.  Nevertheless, he has old bypass grafts and the symptoms could be cardiac.  Work-up so far has been negative with a normal EKG and normal troponins.  I recommend a stress test today.  If this is low risk, then he could be discharged later today.  LDL is not at goal less than 70.  Will need to follow this closely and further adjust his medications.  He wishes to follow-up with me in [place].  He is wife is a patient that I have seen in the past.  Thanks for the consultation.  Time Spent Directly with Patient:  I have spent a total of 45 minutes with the patient reviewing hospital notes, telemetry, EKGs, labs and examining the patient as well as establishing an assessment and plan that was discussed personally with the patient. > 50% of time was spent in direct patient care.  Length of Stay:  LOS: 0 days   Pixie Casino, MD, [facility], [place] Director of the Advanced Lipid Disorders &  Cardiovascular Risk Reduction Clinic Diplomate of the American Board of Clinical Lipidology Attending  Cardiologist  Direct Dial: [phone]  Fax: [phone]  Website:  [email or web address]   Dominic Mahaney Hilty 09/05/2017, 8:21 AM

## 2017-09-05 NOTE — H&P (Addendum)
History and Physical    Billy Cisneros:[id number] DOB: [birth year] DOA: 09/04/2017  Referring MD/NP/PA: Dr. Jaci Carrel PCP: No primary care provider on file.  Patient coming from: Home  Chief Complaint: Chest Pain  I have personally briefly reviewed patient's old medical records in [facility]   HPI: Billy Cisneros is a 67 y.o. male with medical history significant of HTN, CAD s/p 5v CABG in 2000, anxiety, gout, and GERD; who presents with complaints of left-sided chest pain with exertion over the last 3-4 days.  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  However,  pain seem to radiate into his neck and down his left arm.  Notes associated symptoms of numbness in his lips and fingertips, intermittent diaphoresis, lightheadedness, shortness of breath especially worsened with any exertion.  Denies having any nausea, vomiting, or loss of consciousness.  Patient's last cardiac cath was approximately 5 or 6 years ago.  States symptoms feel similar to his previous heart attack which made him come in for further evaluation.  Pain is not reproducible on palpation.   ED Course: Upon admission into the emergency department patient was noted to be afebrile, respirations 13-24, blood pressure 99/69-172/75, and O2 saturation maintained on room air.  Labs revealed BUN 26, creatinine 1.69, troponin.  Chest x-ray showed no acute abnormalities.  Patient was given aspirin and nitroglycerin in the ED and placed on a heparin drip due to concern for ACS. [facility] called to admit.  He still continues to complain of chest pain.   Review of Systems  Constitutional: Positive for diaphoresis, malaise/fatigue and weight loss. Negative for chills and fever.  HENT: Negative for congestion and nosebleeds.   Eyes: Negative for photophobia and pain.  Respiratory: Positive for shortness of  breath. Negative for cough.   Cardiovascular: Positive for chest pain. Negative for leg swelling.  Gastrointestinal: Negative for abdominal pain, nausea and vomiting.  Genitourinary: Negative for frequency.  Musculoskeletal: Positive for joint pain. Negative for falls.  Neurological: Positive for dizziness. Negative for loss of consciousness.  Psychiatric/Behavioral: Negative for substance abuse and suicidal ideas.    Past Medical History:  Diagnosis Date  . Anxiety   . Coronary artery disease   . Depression   . GERD (gastroesophageal reflux disease)   . Gout   . Hypertension     Past Surgical History:  Procedure Laterality Date  . CARDIAC SURGERY    . COLONOSCOPY WITH PROPOFOL N/A 05/25/2017   Procedure: COLONOSCOPY WITH PROPOFOL;  Surgeon: Wyline Mood, MD;  Location: [facility] ENDOSCOPY;  Service: Gastroenterology;  Laterality: N/A;  . CORONARY ARTERY BYPASS GRAFT       reports that he has never smoked. He has never used smokeless tobacco. He reports that he drinks alcohol. He reports that he has current or past drug history. Drug: Cocaine. Frequency: 2.00 times per week.  No Known Allergies  Family History  Problem Relation Age of Onset  . Heart attack Mother   . Hyperlipidemia Mother   . Hypertension Mother   . Cancer Father        lung  . Diabetes Sister   . Diabetes Brother   . Asthma Daughter   . Cancer Maternal Grandmother   . Stroke Maternal Grandfather   . Heart attack Paternal Grandfather     Prior to Admission medications   Medication Sig Start Date End Date  Taking? Authorizing Provider  ALPRAZolam (XANAX) 0.5 MG tablet TAKE 1 TABLET BY MOUTH 3 TIMES A DAY AS NEEDED FOR ANXIETY. 06/24/17  Yes Ellyn Hack, MD  aspirin EC 81 MG tablet Take 1 tablet (81 mg total) by mouth daily. For heart attack prevention. 11/22/11  Yes Mashburn, Rona Ravens, PA-C  buPROPion (WELLBUTRIN XL) 300 MG 24 hr tablet Take 1 tablet (300 mg total) by mouth daily. 06/24/17  Yes Ellyn Hack, MD  lisinopril (PRINIVIL,ZESTRIL) 10 MG tablet Take 1 tablet (10 mg total) by mouth daily. 06/24/17  Yes Ellyn Hack, MD  omeprazole (PRILOSEC) 20 MG capsule Take 1 capsule (20 mg total) by mouth 2 (two) times daily before a meal. 06/24/17  Yes Ellyn Hack, MD  simvastatin (ZOCOR) 40 MG tablet Take 1 tablet (40 mg total) by mouth at bedtime. 06/24/17  Yes Ellyn Hack, MD  valACYclovir (VALTREX) 1000 MG tablet Take 1,000 mg by mouth 2 (two) times daily as needed (outbreak).   Yes [provider]  zolpidem (AMBIEN) 10 MG tablet Take 1 tablet (10 mg total) by mouth at bedtime. 06/24/17  Yes Ellyn Hack, MD  clotrimazole-betamethasone (LOTRISONE) cream Apply topically 2 (two) times daily as needed. Patient not taking: Reported on 09/04/2017 04/16/17   Ellyn Hack, MD    Physical Exam:  Constitutional: Overweight male in NAD, calm, comfortable Vitals:   09/04/17 2315 09/04/17 2330 09/04/17 2345 09/05/17 0015  BP: (!) 143/83 115/78 110/78 109/84  Pulse: 76 72 69 73  Resp: 17 17 (!) 24 18  Temp:      TempSrc:      SpO2: 98% 97% 98% 99%  Weight:      Height:       Eyes: PERRL, lids and conjunctivae normal ENMT: Mucous membranes are dry. Posterior pharynx clear of any exudate or lesions.  Neck: normal, supple, no masses, no thyromegaly Respiratory: clear to auscultation bilaterally, no wheezing, no crackles. Normal respiratory effort. No accessory muscle use.  Cardiovascular: Regular rate and rhythm, no murmurs / rubs / gallops. No extremity edema. 2+ pedal pulses. No carotid bruits.  Abdomen: no tenderness, no masses palpated. No hepatosplenomegaly. Bowel sounds positive.  Musculoskeletal: no clubbing / cyanosis. No joint deformity upper and lower extremities. Good ROM, no contractures. Normal muscle tone.  Skin: no rashes, lesions, ulcers. No induration Neurologic: CN 2-12 grossly intact. Sensation intact, DTR normal. Strength 5/5 in all 4.    Psychiatric: Normal judgment and insight. Alert and oriented x 3. Normal mood.     Labs on Admission: I have personally reviewed following labs and imaging studies  CBC: Recent Labs  Lab 09/04/17 1757  WBC 7.2  HGB 13.8  HCT 40.6  MCV 90.2  PLT 244   Basic Metabolic Panel: Recent Labs  Lab 09/04/17 1757  NA 139  K 4.1  CL 106  CO2 22  GLUCOSE 73  BUN 26*  CREATININE 1.69*  CALCIUM 9.0   GFR: Estimated Creatinine Clearance: 41.1 mL/min (A) (by C-G formula based on SCr of 1.69 mg/dL (H)). Liver Function Tests: No results for input(s): AST, ALT, ALKPHOS, BILITOT, PROT, ALBUMIN in the last 168 hours. No results for input(s): LIPASE, AMYLASE in the last 168 hours. No results for input(s): AMMONIA in the last 168 hours. Coagulation Profile: No results for input(s): INR, PROTIME in the last 168 hours. Cardiac Enzymes: No results for input(s): CKTOTAL, CKMB, CKMBINDEX, TROPONINI in the last  168 hours. BNP (last 3 results) No results for input(s): PROBNP in the last 8760 hours. HbA1C: No results for input(s): HGBA1C in the last 72 hours. CBG: No results for input(s): GLUCAP in the last 168 hours. Lipid Profile: No results for input(s): CHOL, HDL, LDLCALC, TRIG, CHOLHDL, LDLDIRECT in the last 72 hours. Thyroid Function Tests: No results for input(s): TSH, T4TOTAL, FREET4, T3FREE, THYROIDAB in the last 72 hours. Anemia Panel: No results for input(s): VITAMINB12, FOLATE, FERRITIN, TIBC, IRON, RETICCTPCT in the last 72 hours. Urine analysis: No results found for: COLORURINE, APPEARANCEUR, LABSPEC, PHURINE, GLUCOSEU, HGBUR, BILIRUBINUR, KETONESUR, PROTEINUR, UROBILINOGEN, NITRITE, LEUKOCYTESUR Sepsis Labs: No results found for this or any previous visit (from the past 240 hour(s)).   Radiological Exams on Admission: Dg Chest 2 View  Result Date: 09/04/2017 CLINICAL DATA:  Chest tightness EXAM: CHEST - 2 VIEW COMPARISON:  09/09/2012 FINDINGS: Post sternotomy changes. No  focal opacity or pleural effusion. Stable cardiomediastinal silhouette. No pneumothorax. IMPRESSION: No active cardiopulmonary disease. Electronically Signed   By: Jasmine Pang M.D.   On: 09/04/2017 19:25    EKG: Independently reviewed.  Normal sinus rhythm 86 bpm  Assessment/Plan Chest pain/concern for possible unstable angina: Acute.  Patient with chest pain.  Troponins negative x2 and EKG showing no ischemic changes.  Heart score 6.  Previous history of coronary artery disease requiring CABG.   - Admit to stepdown bed - Continue heparin drip per pharmacy - Cardiac troponin x3 q 3hrs - Nitroglycerin prn chest pain - Morphine IV as needed pain  - Will warrant cardiology consultation in a.m.  CAD s/p 5 vessel CABG in 2000  Chronic kidney disease stage III: Chronic.  Initial creatinine noted to be 1.6 and BUN 26 to possibly set [place] some signs of dehydration.  Baseline creatinine appears to range from 1.5-1.7. - Gentle IV fluids at 75 mL/h overnight - Continue to monitor  Essential hypertension  - Continue lisinopril   Anxiety  - continue Wellbutrin and Xanax prn  Dyslipidemia  - continue simvastatin  DVT prophylaxis: Heparin Code Status: Full  Family Communication: Discussed plan of care with the patient and family present at bedside Disposition Plan: To be determined Consults called: none  Admission status: Observation  Clydie Braun MD [facility] Pager [phone]   If 7PM-7AM, please contact night-coverage [email or web address] Password [email or web address]  09/05/2017, 12:35 AM

## 2017-09-05 NOTE — ED Notes (Signed)
Report given to Tammy RN on [facility]

## 2017-09-05 NOTE — ED Notes (Signed)
Spoke with pt placement and admitting provider about need for step down bed

## 2017-09-05 NOTE — ED Notes (Signed)
Patient still off the floor for scan. 

## 2017-09-05 NOTE — ED Notes (Signed)
Patient and family given warm blanket.

## 2017-09-05 NOTE — ED Notes (Signed)
Pt states chest pain remains unchanged after nitroglycerin. And stays constant at a 5 on a scale from 0-10

## 2017-09-05 NOTE — ED Provider Notes (Signed)
[facility] EMERGENCY DEPARTMENT Provider Note   CSN: [id number] Arrival date & time: 09/04/17  1746     History   Chief Complaint Chief Complaint  Patient presents with  . Chest Pain    HPI Billy Cisneros is a 67 y.o. male.  Patient presents to the ER with complaints of chest discomfort.  Patient reports that he has a history of coronary artery disease, had 5 vessel bypass surgery in 2000.  Recently he has noticed that he has a decreased exercise capacity.  He frequently gets chest discomfort with exertion and has to sit down and rest.  Up until the last 2 or 3 days the rest alleviated the pain.  Over the last 2 to 3 days, however, he has been having pain not alleviated by rest and also at times pain at rest.  Pain radiates into the left shoulder.  No nausea, vomiting, diaphoresis.     Past Medical History:  Diagnosis Date  . Anxiety   . Coronary artery disease   . Depression   . GERD (gastroesophageal reflux disease)   . Gout   . Hypertension     Patient Active Problem List   Diagnosis Date Noted  . Acute gout 02/19/2016  . GERD (gastroesophageal reflux disease) 01/21/2016  . Pain and swelling of right wrist 08/13/2015  . Productive cough 08/13/2015  . Chronic right shoulder pain 06/12/2015  . Cannot sleep 06/12/2015  . Dyslipidemia 03/12/2015  . Cervical radiculopathy 01/08/2015  . Annual physical exam 01/01/2015  . H/O coronary artery bypass surgery 12/11/2014  . Hypertension 12/04/2014  . Anxiety 12/04/2014  . Chest pain 12/04/2014  . Major depression 11/21/2011    Past Surgical History:  Procedure Laterality Date  . CARDIAC SURGERY    . COLONOSCOPY WITH PROPOFOL N/A 05/25/2017   Procedure: COLONOSCOPY WITH PROPOFOL;  Surgeon: Wyline Mood, MD;  Location: [facility] ENDOSCOPY;  Service: Gastroenterology;  Laterality: N/A;  . CORONARY ARTERY BYPASS GRAFT          Home Medications    Prior to Admission medications   Medication Sig  Start Date End Date Taking? Authorizing Provider  ALPRAZolam (XANAX) 0.5 MG tablet TAKE 1 TABLET BY MOUTH 3 TIMES A DAY AS NEEDED FOR ANXIETY. 06/24/17  Yes Ellyn Hack, MD  aspirin EC 81 MG tablet Take 1 tablet (81 mg total) by mouth daily. For heart attack prevention. 11/22/11  Yes Mashburn, Rona Ravens, PA-C  buPROPion (WELLBUTRIN XL) 300 MG 24 hr tablet Take 1 tablet (300 mg total) by mouth daily. 06/24/17  Yes Ellyn Hack, MD  lisinopril (PRINIVIL,ZESTRIL) 10 MG tablet Take 1 tablet (10 mg total) by mouth daily. 06/24/17  Yes Ellyn Hack, MD  omeprazole (PRILOSEC) 20 MG capsule Take 1 capsule (20 mg total) by mouth 2 (two) times daily before a meal. 06/24/17  Yes Ellyn Hack, MD  simvastatin (ZOCOR) 40 MG tablet Take 1 tablet (40 mg total) by mouth at bedtime. 06/24/17  Yes Ellyn Hack, MD  valACYclovir (VALTREX) 1000 MG tablet Take 1,000 mg by mouth 2 (two) times daily as needed (outbreak).   Yes [provider]  zolpidem (AMBIEN) 10 MG tablet Take 1 tablet (10 mg total) by mouth at bedtime. 06/24/17  Yes Ellyn Hack, MD  clotrimazole-betamethasone (LOTRISONE) cream Apply topically 2 (two) times daily as needed. Patient not taking: Reported on 09/04/2017 04/16/17   Ellyn Hack, MD  Family History Family History  Problem Relation Age of Onset  . Heart attack Mother   . Hyperlipidemia Mother   . Hypertension Mother   . Cancer Father        lung  . Diabetes Sister   . Diabetes Brother   . Asthma Daughter   . Cancer Maternal Grandmother   . Stroke Maternal Grandfather   . Heart attack Paternal Grandfather     Social History Social History   Tobacco Use  . Smoking status: Never Smoker  . Smokeless tobacco: Never Used  Substance Use Topics  . Alcohol use: Yes    Alcohol/week: 0.0 oz    Comment: 3 beer a month  . Drug use: Yes    Frequency: 2.0 times per week    Types: Cocaine    Comment: last use 15-20 years per patient      Allergies   Patient has no known allergies.   Review of Systems Review of Systems  Cardiovascular: Positive for chest pain.  All other systems reviewed and are negative.    Physical Exam Updated Vital Signs BP 109/84   Pulse 73   Temp 98.3 F (36.8 C)   Resp 18   Ht  (1.651 m)   Wt 74.4 kg (164 lb)   SpO2 99%   BMI 27.29 kg/m   Physical Exam  Constitutional: He is oriented to person, place, and time. He appears well-developed and well-nourished. No distress.  HENT:  Head: Normocephalic and atraumatic.  Right Ear: Hearing normal.  Left Ear: Hearing normal.  Nose: Nose normal.  Mouth/Throat: Oropharynx is clear and moist and mucous membranes are normal.  Eyes: Pupils are equal, round, and reactive to light. Conjunctivae and EOM are normal.  Neck: Normal range of motion. Neck supple.  Cardiovascular: Regular rhythm, S1 normal and S2 normal. Exam reveals no gallop and no friction rub.  No murmur heard. Pulmonary/Chest: Effort normal and breath sounds normal. No respiratory distress. He exhibits no tenderness.  Abdominal: Soft. Normal appearance and bowel sounds are normal. There is no hepatosplenomegaly. There is no tenderness. There is no rebound, no guarding, no tenderness at McBurney's point and negative Murphy's sign. No hernia.  Musculoskeletal: Normal range of motion.  Neurological: He is alert and oriented to person, place, and time. He has normal strength. No cranial nerve deficit or sensory deficit. Coordination normal. GCS eye subscore is 4. GCS verbal subscore is 5. GCS motor subscore is 6.  Skin: Skin is warm, dry and intact. No rash noted. No cyanosis.  Psychiatric: He has a normal mood and affect. His speech is normal and behavior is normal. Thought content normal.  Nursing note and vitals reviewed.    ED Treatments / Results  Labs (all labs ordered are listed, but only abnormal results are displayed) Labs Reviewed  BASIC METABOLIC PANEL -  Abnormal; Notable for the following components:      Result Value   BUN 26 (*)    Creatinine, Ser 1.69 (*)    GFR calc non Af Amer 41 (*)    GFR calc Af Amer 47 (*)    All other components within normal limits  CBC  APTT  PROTIME-INR  LIPID PANEL  HEPARIN LEVEL (UNFRACTIONATED)  CBC  I-STAT TROPONIN, ED  I-STAT TROPONIN, ED    EKG EKG Interpretation  Date/Time:  Friday September 04 2017 17:53:00 EDT Ventricular Rate:  86 PR Interval:  144 QRS Duration: 80 QT Interval:  348 QTC Calculation: 416 R Axis:  81 Text Interpretation:  Normal sinus rhythm Normal ECG Confirmed by Gilda Crease [phone]) on 09/04/2017 11:04:50 PM   Radiology Dg Chest 2 View  Result Date: 09/04/2017 CLINICAL DATA:  Chest tightness EXAM: CHEST - 2 VIEW COMPARISON:  09/09/2012 FINDINGS: Post sternotomy changes. No focal opacity or pleural effusion. Stable cardiomediastinal silhouette. No pneumothorax. IMPRESSION: No active cardiopulmonary disease. Electronically Signed   By: Jasmine Pang M.D.   On: 09/04/2017 19:25    Procedures Procedures (including critical care time)  Medications Ordered in ED Medications  nitroGLYCERIN (NITROSTAT) SL tablet 0.4 mg (0.4 mg Sublingual Given 09/05/17 0028)  heparin ADULT infusion 100 units/mL (25000 units/215mL sodium chloride 0.45%) (1,000 Units/hr Intravenous New Bag/Given 09/05/17 0027)  0.9 %  sodium chloride infusion (has no administration in time range)  heparin bolus via infusion 4,000 Units (4,000 Units Intravenous Bolus from Bag 09/05/17 0027)  aspirin chewable tablet 324 mg (324 mg Oral Given 09/05/17 0005)     Initial Impression / Assessment and Plan / ED Course  I have reviewed the triage vital signs and the nursing notes.  Pertinent labs & imaging results that were available during my care of the patient were reviewed by me and considered in my medical decision making (see chart for details).     Patient presents to the ER for evaluation of  chest pain.  He has been having exertional chest pain recently and now over the last couple of days pain does not completely relieved by rest and pain that sometimes initiates at rest.  This is a similar pattern to what he had prior to his bypass surgery.  His EKG does not suggest ischemia or infarct.  He has had 2- troponins.  I am, however, concerned that he is experiencing unstable angina.  His bypass grafts are 66 years old.  He will require further work-up for possible recurrent blockage.  He was initiated nitroglycerin, aspirin and started on heparin drip.  CRITICAL CARE Performed by: Gilda Crease   Total critical care time: 30 minutes  Critical care time was exclusive of separately billable procedures and treating other patients.  Critical care was necessary to treat or prevent imminent or life-threatening deterioration.  Critical care was time spent personally by me on the following activities: development of treatment plan with patient and/or surrogate as well as nursing, discussions with consultants, evaluation of patient's response to treatment, examination of patient, obtaining history from patient or surrogate, ordering and performing treatments and interventions, ordering and review of laboratory studies, ordering and review of radiographic studies, pulse oximetry and re-evaluation of patient's condition.   Final Clinical Impressions(s) / ED Diagnoses   Final diagnoses:  Unstable angina [facility])    ED Discharge Orders    None       Pollina, Canary Brim, MD 09/05/17 0100

## 2017-09-05 NOTE — Progress Notes (Signed)
Pt arrived to unit from [facility] via ED RN. Pt is oriented to unit and routine. C/o pain 4/10, pressure, but reports it is much better than prior and without radiation now. He is on hep gtt at 10. He request his xanax at this time. It was provided. He request food. Per order he is NPO until stress test results are known. Explained to pt and wife. Call bell within reach, wife to remain at bedside. CMT called. Will continue to monitor.

## 2017-09-05 NOTE — Progress Notes (Signed)
ANTICOAGULATION CONSULT NOTE - Initial Consult  Pharmacy Consult for Heparin  Indication: chest pain/ACS  No Known Allergies  Patient Measurements: Height:  (165.1 cm) Weight: 164 lb (74.4 kg) IBW/kg (Calculated) : 61.5  Vital Signs: Temp: 98.3 F (36.8 C) (04/26 2136) Temp Source: Oral (04/26 2014) BP: 110/78 (04/26 2345) Pulse Rate: 69 (04/26 2345)  Labs: Recent Labs    09/04/17 1757  HGB 13.8  HCT 40.6  PLT 244  CREATININE 1.69*    Estimated Creatinine Clearance: 41.1 mL/min (A) (by C-G formula based on SCr of 1.69 mg/dL (H)).   Medical History: Past Medical History:  Diagnosis Date  . Anxiety   . Coronary artery disease   . Depression   . GERD (gastroesophageal reflux disease)   . Gout   . Hypertension     Assessment: 67 y/o M to the ED with radiating chest tightness, starting heparin per pharmacy, CBC good, mild bump in Scr, PTA meds reviewed.   Goal of Therapy:  Heparin level 0.3-0.7 units/ml Monitor platelets by anticoagulation protocol: Yes   Plan:  Heparin 4000 units BOLUS Start heparin drip at 1000 units/hr 0800 HL Daily CBC/HL Monitor for bleeding   Abran Duke 09/05/2017,12:20 AM

## 2017-09-06 LAB — HIV ANTIBODY (ROUTINE TESTING W REFLEX): HIV SCREEN 4TH GENERATION: NONREACTIVE

## 2017-09-07 ENCOUNTER — Telehealth: Payer: Self-pay | Admitting: *Deleted

## 2017-09-07 NOTE — Telephone Encounter (Signed)
Left message for patient to call and schedule post hospital visit with Dr. Rennis Golden in 2-4 weeks

## 2017-10-01 ENCOUNTER — Ambulatory Visit: Payer: Medicare Other | Admitting: Internal Medicine

## 2017-10-02 IMAGING — CR DG HAND COMPLETE 3+V*R*
1 series · 3 of 3 positions shown · non-contrast
Comparison: None.

CLINICAL DATA: Right hand swelling.

EXAM:
RIGHT HAND - COMPLETE 3+ VIEW

[Series 1: dg hand complete right · 0.14mm/px · 3 of 3 slices shown]
[im 1/3]
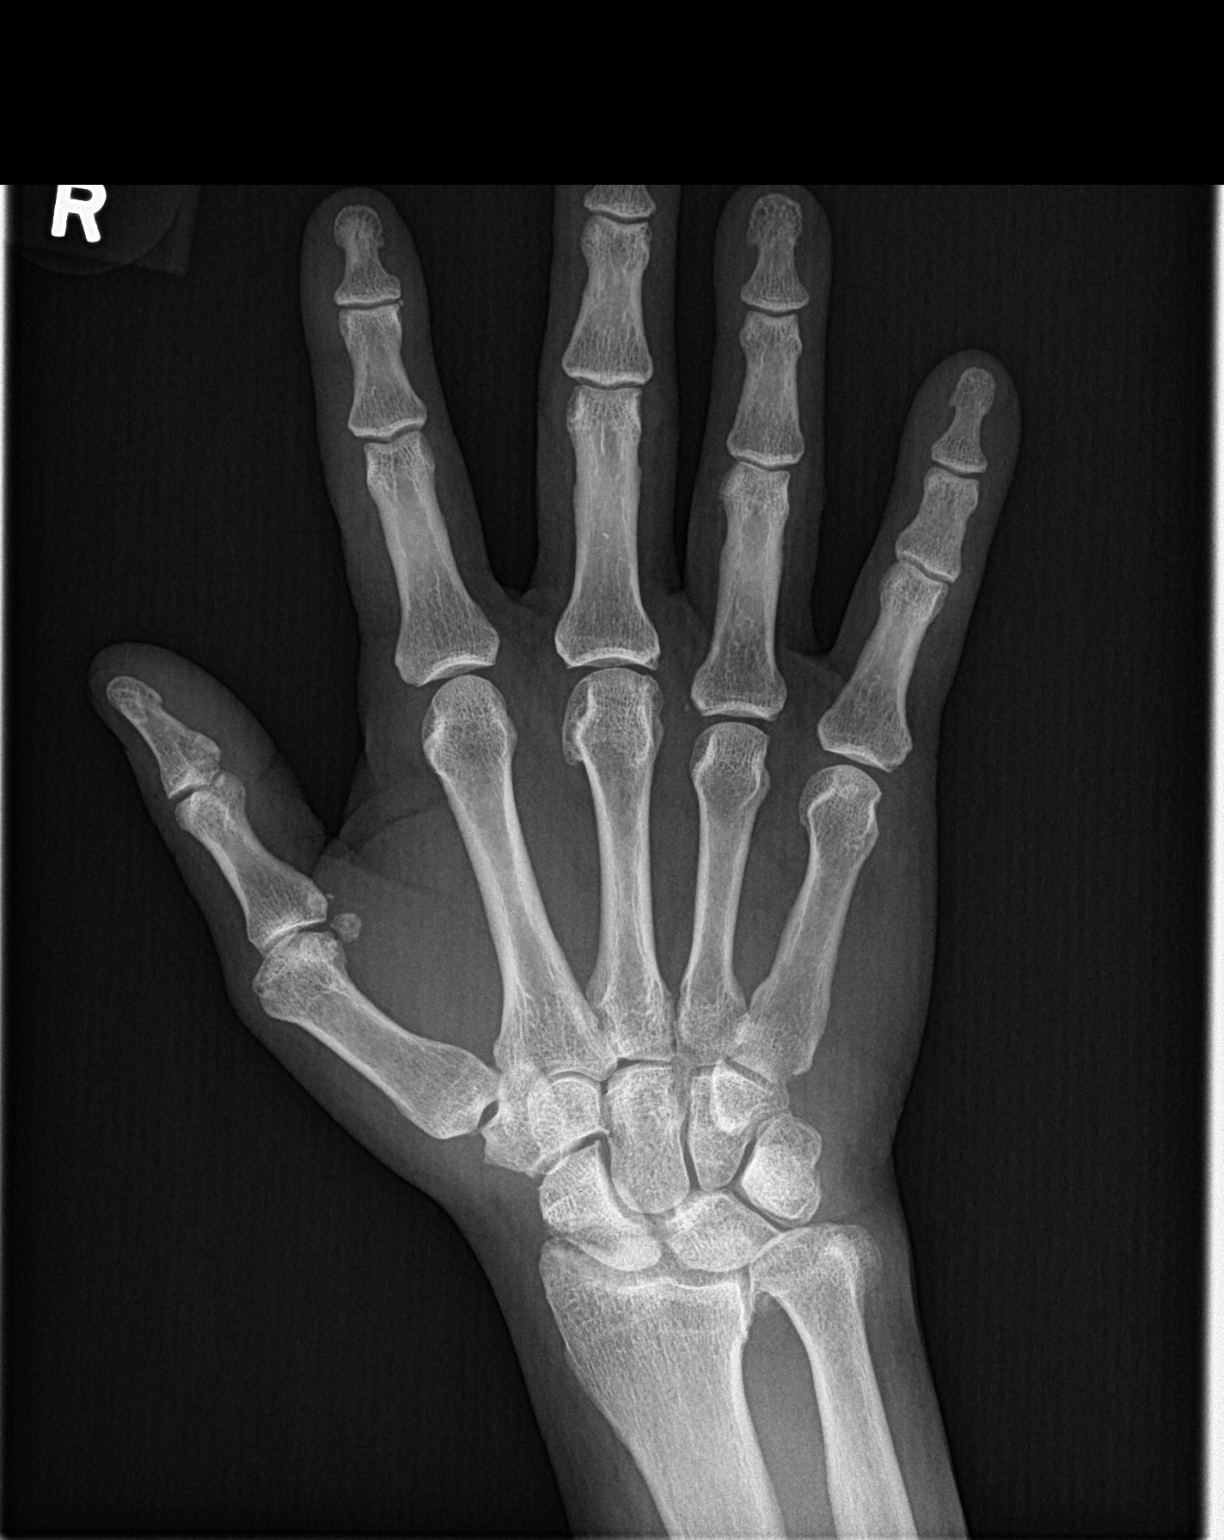
[im 2/3]
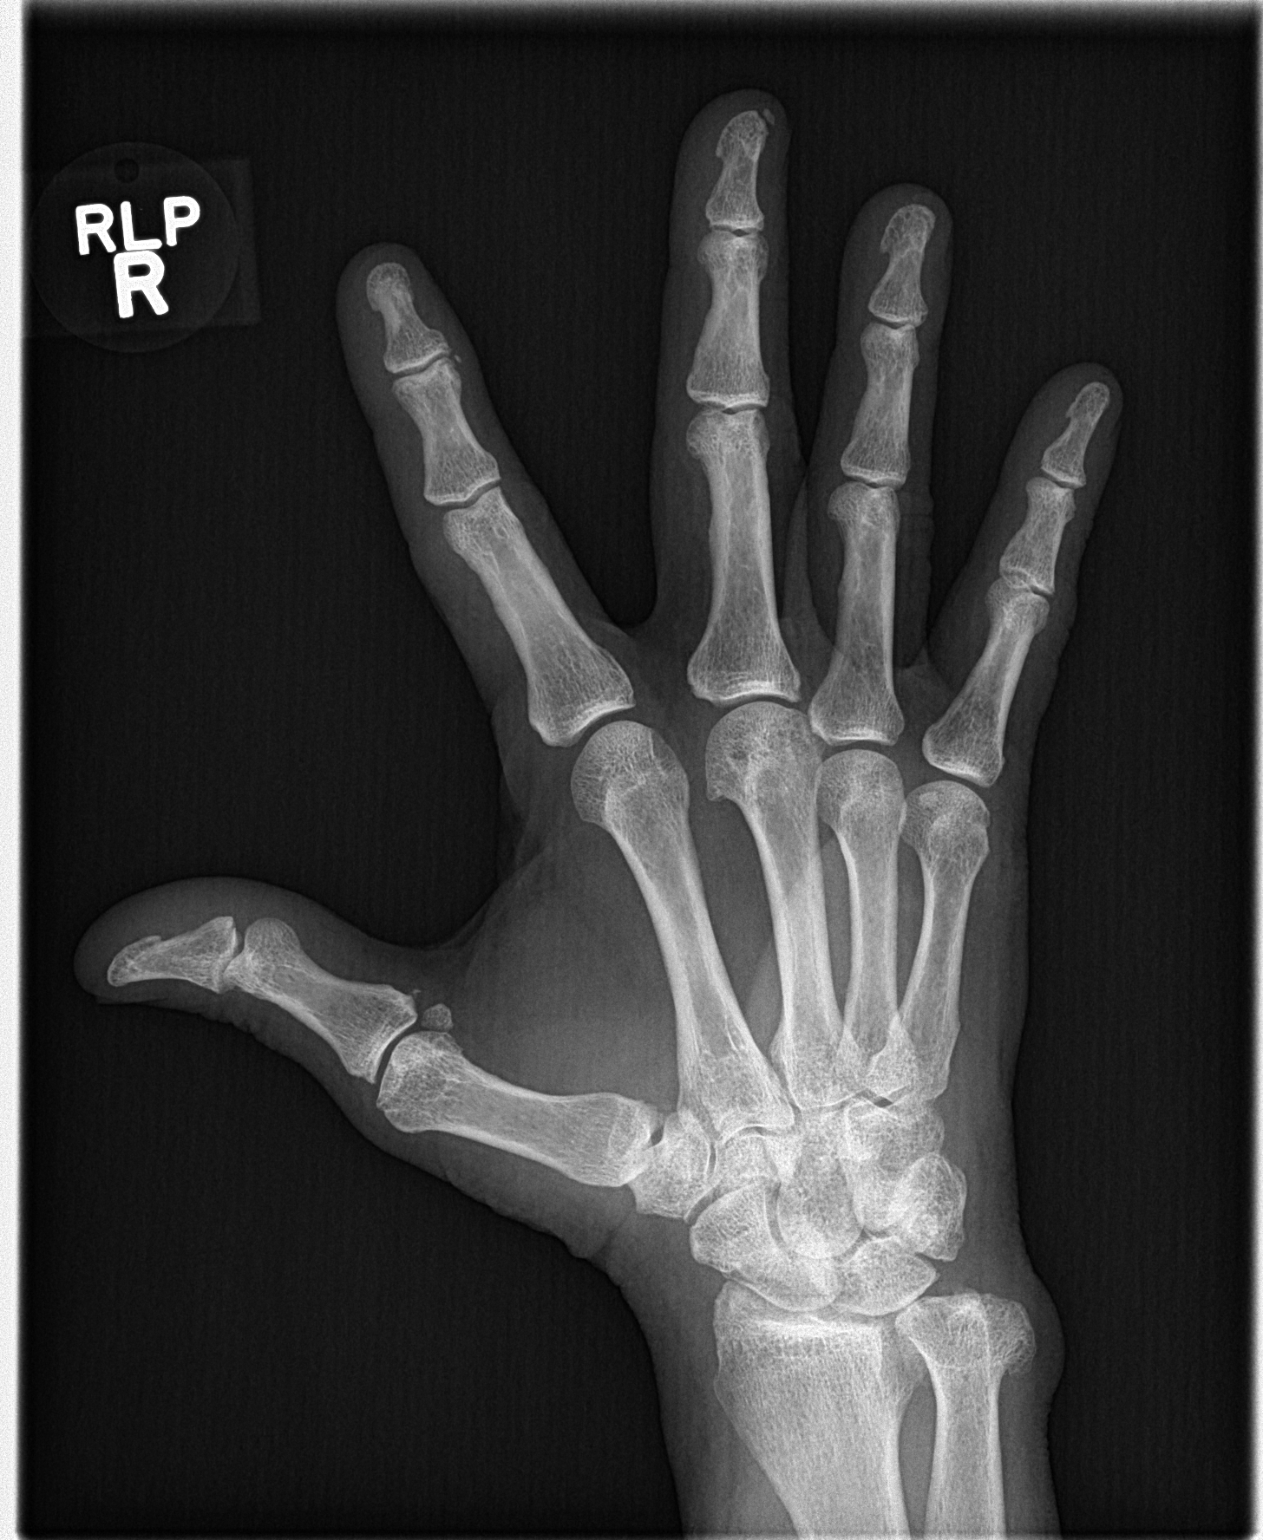
[im 3/3]
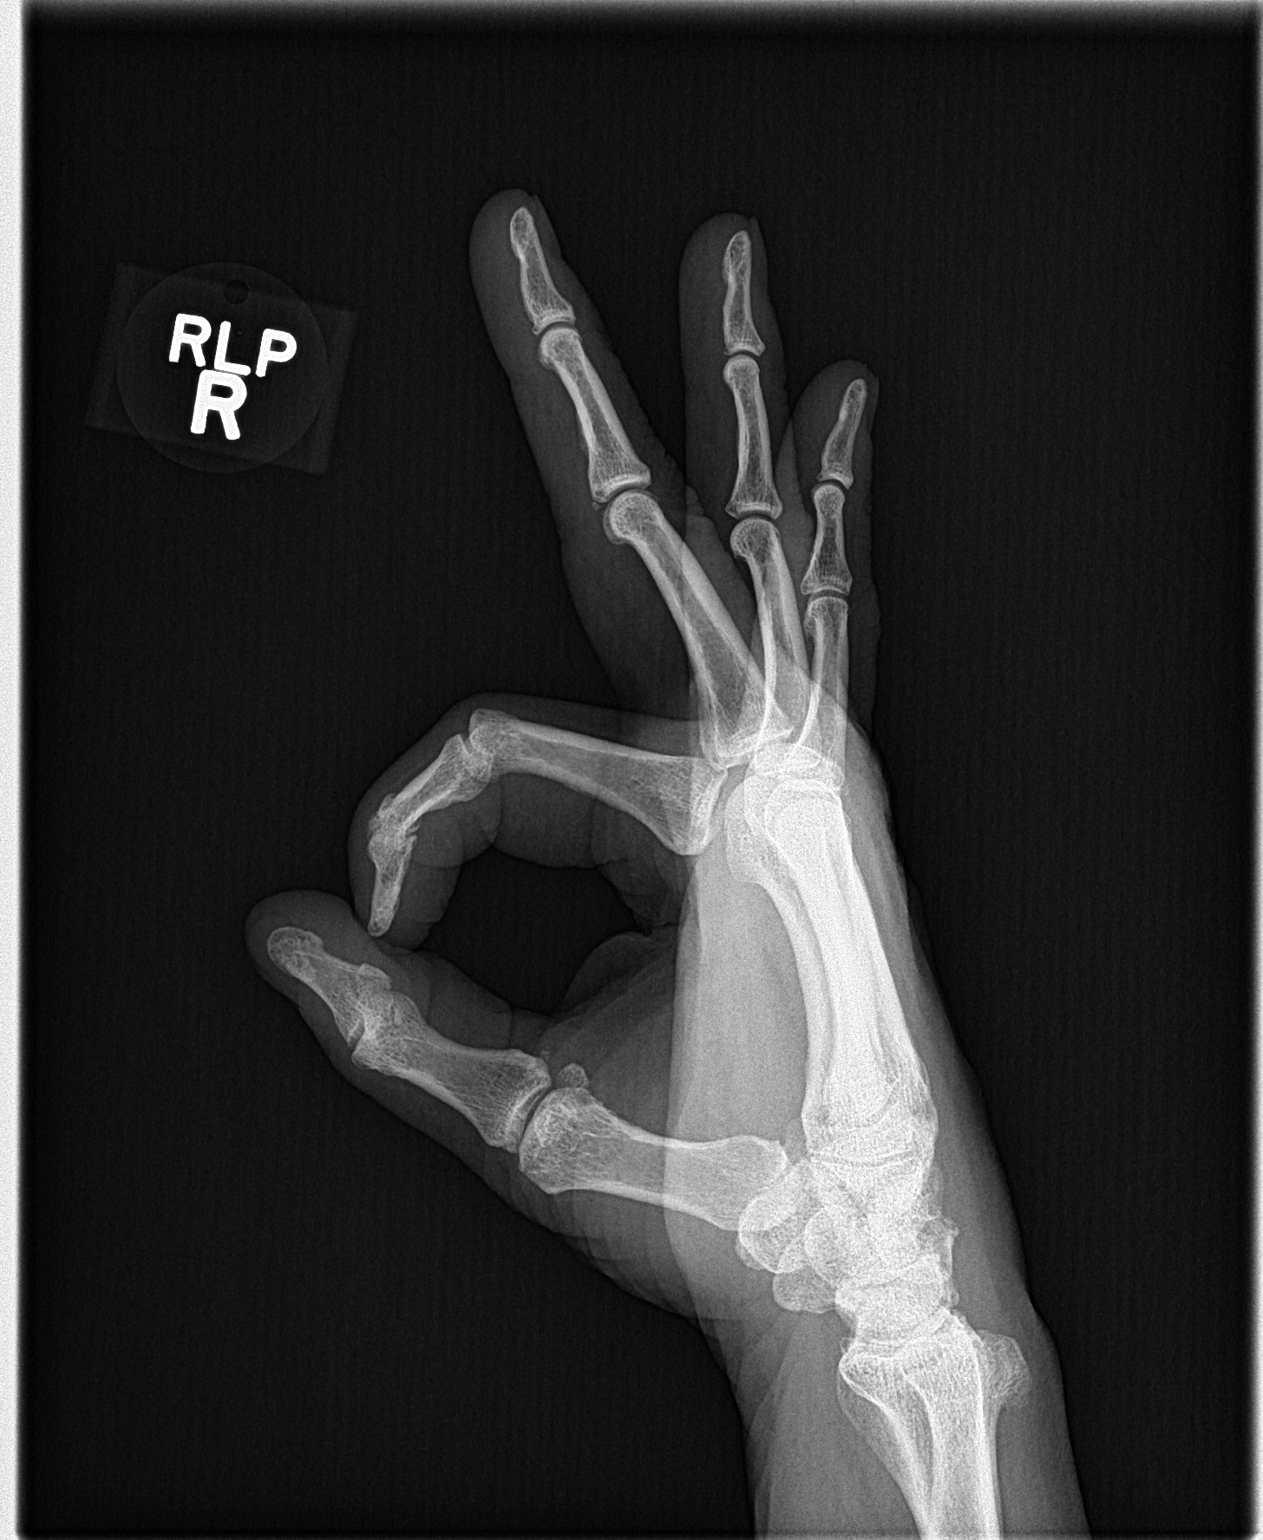

[3 of 3 positions shown; findings below may reference images not displayed]

FINDINGS: No evidence of acute fracture. Mild degenerative changes are present
of multiple fingers without erosions. There is healed deformity
involving the distal ulna which may relate to prior injury. Soft
tissues are unremarkable without evidence of foreign body or
abnormal gas.
IMPRESSION: No acute findings. Mild degenerative osteoarthritis of multiple
fingers and healed deformity of the distal ulna.

## 2017-10-02 IMAGING — CR DG WRIST COMPLETE 3+V*R*
1 series · 4 of 4 positions shown · non-contrast
Comparison: None

CLINICAL DATA: Right wrist pain for several years.

EXAM:
RIGHT WRIST - COMPLETE 3+ VIEW

[Series 1: dg wrist complete right · 0.14mm/px · 4 of 4 slices shown]
[im 1/4]
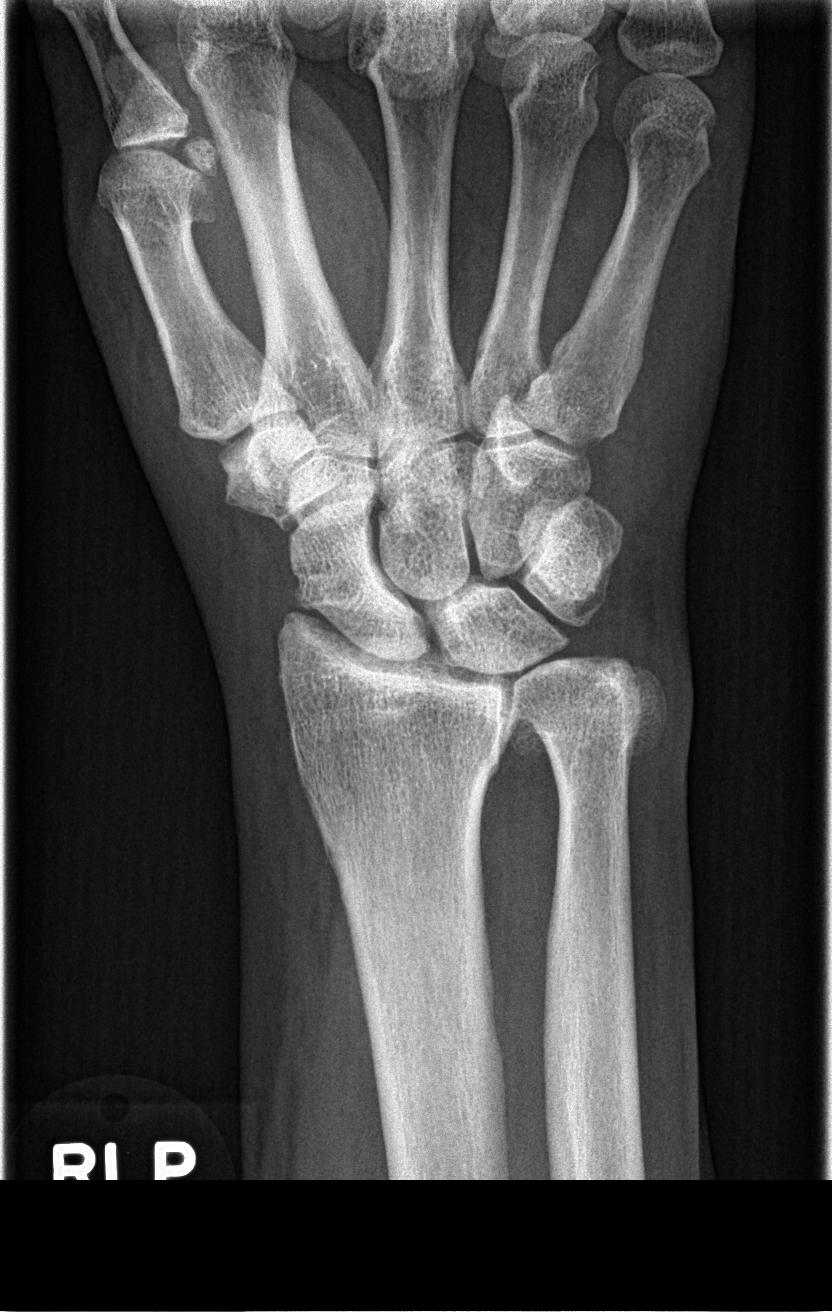
[im 2/4]
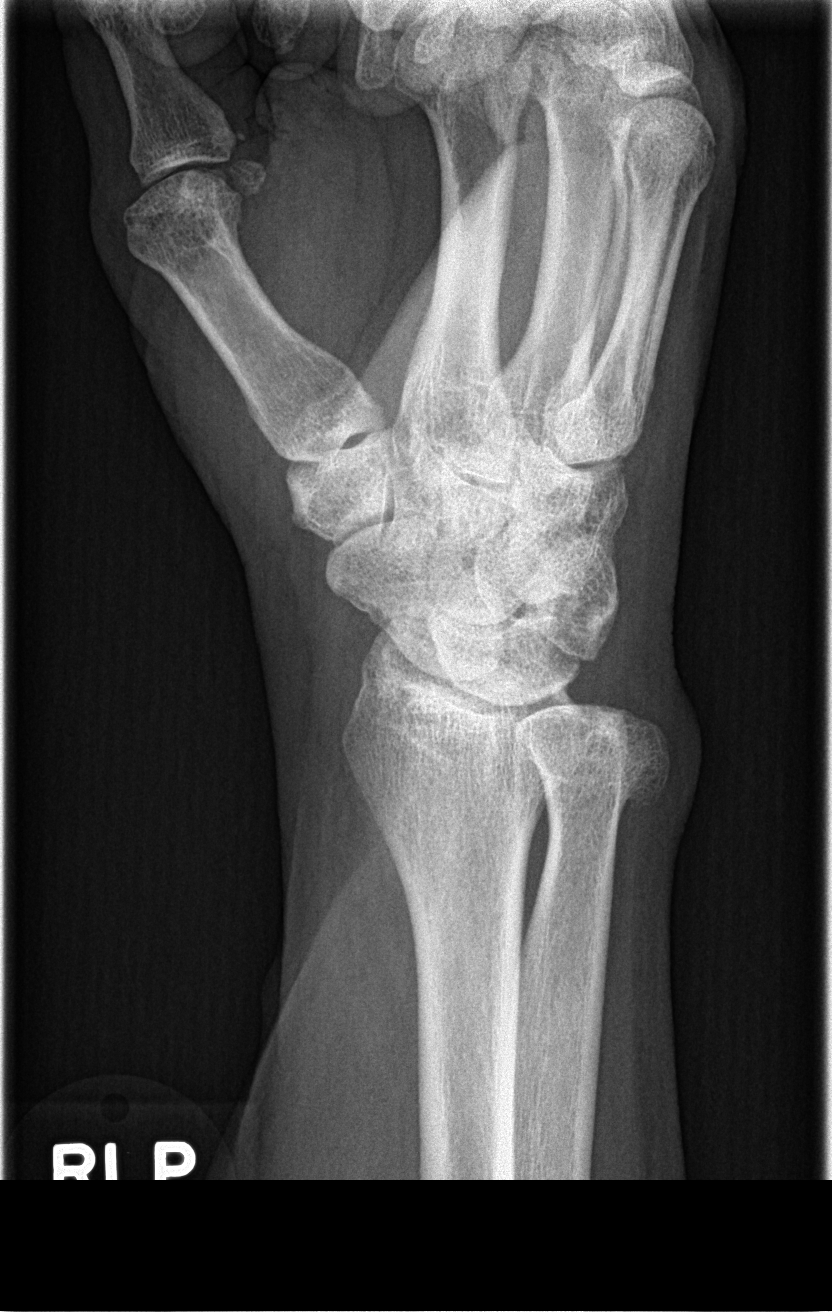
[im 3/4]
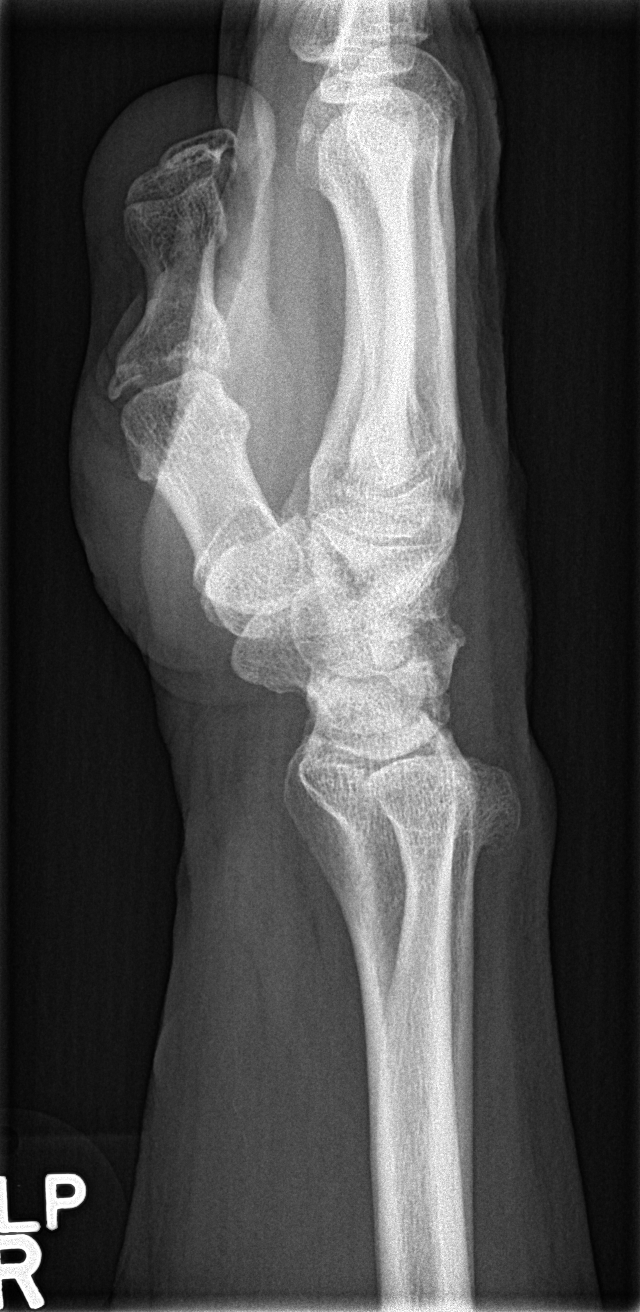
[im 4/4]
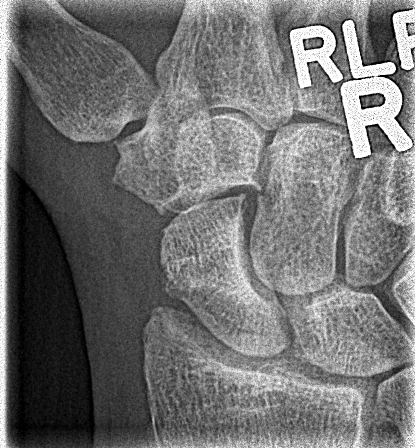

[4 of 4 positions shown; findings below may reference images not displayed]

FINDINGS: No acute fracture or subluxation. Mild positive ulnar variance is
identified. Degenerative changes at the distal radial ulnar joint is
noted. The soft tissues are unremarkable.
IMPRESSION: 1. Mild positive ulnar variance
2. Distal radial ulnar joint osteoarthritis.

## 2017-10-27 ENCOUNTER — Encounter
Admission: EM | Disposition: A | Discharge status: Home / Self Care | Payer: Self-pay | Source: Home / Self Care | Attending: Emergency Medicine

## 2017-10-27 ENCOUNTER — Emergency Department: Payer: Medicare Other | Admitting: Anesthesiology

## 2017-10-27 ENCOUNTER — Other Ambulatory Visit: Payer: Self-pay

## 2017-10-27 ENCOUNTER — Ambulatory Visit: Payer: Medicare Other | Admitting: Adult Health

## 2017-10-27 ENCOUNTER — Encounter: Payer: Self-pay | Admitting: Emergency Medicine

## 2017-10-27 ENCOUNTER — Emergency Department
Admission: EM | Admit: 2017-10-27 | Discharge: 2017-10-27 | Disposition: A | Discharge status: Home / Self Care | Payer: Medicare Other | Attending: Emergency Medicine | Admitting: Emergency Medicine

## 2017-10-27 DIAGNOSIS — F419 Anxiety disorder, unspecified: Secondary | ICD-10-CM | POA: Insufficient documentation

## 2017-10-27 DIAGNOSIS — X58XXXA Exposure to other specified factors, initial encounter: Secondary | ICD-10-CM | POA: Insufficient documentation

## 2017-10-27 DIAGNOSIS — Z7982 Long term (current) use of aspirin: Secondary | ICD-10-CM | POA: Insufficient documentation

## 2017-10-27 DIAGNOSIS — I2510 Atherosclerotic heart disease of native coronary artery without angina pectoris: Secondary | ICD-10-CM | POA: Insufficient documentation

## 2017-10-27 DIAGNOSIS — K222 Esophageal obstruction: Secondary | ICD-10-CM | POA: Diagnosis not present

## 2017-10-27 DIAGNOSIS — Z951 Presence of aortocoronary bypass graft: Secondary | ICD-10-CM | POA: Insufficient documentation

## 2017-10-27 DIAGNOSIS — R1312 Dysphagia, oropharyngeal phase: Secondary | ICD-10-CM

## 2017-10-27 DIAGNOSIS — M109 Gout, unspecified: Secondary | ICD-10-CM | POA: Insufficient documentation

## 2017-10-27 DIAGNOSIS — Z79899 Other long term (current) drug therapy: Secondary | ICD-10-CM | POA: Insufficient documentation

## 2017-10-27 DIAGNOSIS — F329 Major depressive disorder, single episode, unspecified: Secondary | ICD-10-CM | POA: Diagnosis not present

## 2017-10-27 DIAGNOSIS — T18128A Food in esophagus causing other injury, initial encounter: Secondary | ICD-10-CM

## 2017-10-27 DIAGNOSIS — K219 Gastro-esophageal reflux disease without esophagitis: Secondary | ICD-10-CM | POA: Diagnosis not present

## 2017-10-27 DIAGNOSIS — I10 Essential (primary) hypertension: Secondary | ICD-10-CM | POA: Insufficient documentation

## 2017-10-27 DIAGNOSIS — Z8249 Family history of ischemic heart disease and other diseases of the circulatory system: Secondary | ICD-10-CM | POA: Insufficient documentation

## 2017-10-27 HISTORY — PX: ESOPHAGOGASTRODUODENOSCOPY: SHX5428

## 2017-10-27 SURGERY — EGD (ESOPHAGOGASTRODUODENOSCOPY)
Anesthesia: General

## 2017-10-27 MED ORDER — GLUCAGON HCL RDNA (DIAGNOSTIC) 1 MG IJ SOLR
INTRAMUSCULAR | Status: AC
  Administered 2017-10-27: 1 mg via INTRAVENOUS
  Filled 2017-10-27: qty 1

## 2017-10-27 MED ORDER — SUCCINYLCHOLINE CHLORIDE 20 MG/ML IJ SOLN
INTRAMUSCULAR | Status: AC
  Filled 2017-10-27: qty 1

## 2017-10-27 MED ORDER — SODIUM CHLORIDE 0.9 % IV SOLN
INTRAVENOUS | Status: DC
  Administered 2017-10-27: 1000 mL via INTRAVENOUS

## 2017-10-27 MED ORDER — SUCCINYLCHOLINE CHLORIDE 20 MG/ML IJ SOLN
INTRAMUSCULAR | Status: DC | PRN
  Administered 2017-10-27: 100 mg via INTRAVENOUS

## 2017-10-27 MED ORDER — LORAZEPAM 2 MG/ML IJ SOLN
0.5000 mg | Freq: Once | INTRAMUSCULAR | Status: AC
  Administered 2017-10-27: 0.5 mg via INTRAVENOUS
  Filled 2017-10-27: qty 1

## 2017-10-27 MED ORDER — GLUCAGON HCL (RDNA) 1 MG IJ SOLR
1.0000 mg | Freq: Once | INTRAMUSCULAR | Status: AC
  Administered 2017-10-27: 1 mg via INTRAVENOUS

## 2017-10-27 MED ORDER — SODIUM CHLORIDE 0.9 % IV BOLUS
1000.0000 mL | Freq: Once | INTRAVENOUS | Status: AC
  Administered 2017-10-27: 1000 mL via INTRAVENOUS

## 2017-10-27 MED ORDER — EPHEDRINE SULFATE 50 MG/ML IJ SOLN
INTRAMUSCULAR | Status: DC | PRN
  Administered 2017-10-27: 5 mg via INTRAVENOUS
  Administered 2017-10-27: 10 mg via INTRAVENOUS

## 2017-10-27 MED ORDER — PROPOFOL 500 MG/50ML IV EMUL
INTRAVENOUS | Status: AC
  Filled 2017-10-27: qty 50

## 2017-10-27 MED ORDER — PROPOFOL 10 MG/ML IV BOLUS
INTRAVENOUS | Status: DC | PRN
  Administered 2017-10-27: 150 mg via INTRAVENOUS
  Administered 2017-10-27: 30 mg via INTRAVENOUS

## 2017-10-27 MED ORDER — LACTATED RINGERS IV SOLN
INTRAVENOUS | Status: DC | PRN
  Administered 2017-10-27: 07:00:00 via INTRAVENOUS

## 2017-10-27 NOTE — Anesthesia Preprocedure Evaluation (Addendum)
Anesthesia Evaluation  Patient identified by MRN, date of birth, ID band Patient awake    Reviewed: Allergy & Precautions, NPO status , Patient's Chart, lab work & pertinent test results  History of Anesthesia Complications Negative for: history of anesthetic complications  Airway Mallampati: III       Dental  (+) Partial Upper, Dental Advidsory Given   Pulmonary neg sleep apnea, neg COPD,           Cardiovascular hypertension, Pt. on medications (-) angina+ CAD and + CABG  (-) Past MI, (-) Cardiac Stents and (-) CHF (-) dysrhythmias (-) Valvular Problems/Murmurs     Neuro/Psych neg Seizures Anxiety Depression    GI/Hepatic Neg liver ROS, GERD  Medicated,  Endo/Other  neg diabetes  Renal/GU CRFRenal disease     Musculoskeletal   Abdominal   Peds  Hematology   Anesthesia Other Findings Past Medical History: No date: Anxiety No date: Coronary artery disease No date: Depression No date: GERD (gastroesophageal reflux disease) No date: Gout No date: Hypertension   Reproductive/Obstetrics                             Anesthesia Physical  Anesthesia Plan  ASA: III and emergent  Anesthesia Plan: General   Post-op Pain Management:    Induction: Intravenous, Rapid sequence and Cricoid pressure planned  PONV Risk Score and Plan: 2 and Ondansetron and Dexamethasone  Airway Management Planned: Oral ETT  Additional Equipment:   Intra-op Plan:   Post-operative Plan:   Informed Consent: I have reviewed the patients History and Physical, chart, labs and discussed the procedure including the risks, benefits and alternatives for the proposed anesthesia with the patient or authorized representative who has indicated his/her understanding and acceptance.   Dental Advisory Given  Plan Discussed with: Anesthesiologist and CRNA  Anesthesia Plan Comments:        Anesthesia Quick  Evaluation

## 2017-10-27 NOTE — ED Notes (Signed)
Pt was notified the endo was called and they said that it will be sent to endo in 1.5 hrs.

## 2017-10-27 NOTE — Anesthesia Post-op Follow-up Note (Signed)
Anesthesia QCDR form completed.        

## 2017-10-27 NOTE — ED Notes (Signed)
Pt is on his way to endo.

## 2017-10-27 NOTE — Anesthesia Postprocedure Evaluation (Signed)
Anesthesia Post Note  Patient: Loistine ChanceKenneth H Averhart Jr.  Procedure(s) Performed: ESOPHAGOGASTRODUODENOSCOPY (EGD) (N/A )  Patient location during evaluation: Endoscopy Anesthesia Type: General Level of consciousness: awake and alert Pain management: pain level controlled Vital Signs Assessment: post-procedure vital signs reviewed and stable Respiratory status: spontaneous breathing, nonlabored ventilation, respiratory function stable and patient connected to nasal cannula oxygen Cardiovascular status: blood pressure returned to baseline and stable Postop Assessment: no apparent nausea or vomiting Anesthetic complications: no     Last Vitals:  Vitals:   10/27/17 0737 10/27/17 0744  BP: 118/76 124/76  Pulse: 97 89  Resp: 19 19  Temp:  36.6 C  SpO2: 97% 97%    Last Pain:  Vitals:   10/27/17 0744  TempSrc:   PainSc: 0-No pain                 Sydnee Lamour S

## 2017-10-27 NOTE — Anesthesia Procedure Notes (Signed)
Procedure Name: Intubation Date/Time: 10/27/2017 6:47 AM Performed by: Lendon Colonel, CRNA Pre-anesthesia Checklist: Patient identified, Patient being monitored, Timeout performed, Emergency Drugs available and Suction available Patient Re-evaluated:Patient Re-evaluated prior to induction Oxygen Delivery Method: Circle system utilized Preoxygenation: Pre-oxygenation with 100% oxygen Induction Type: IV induction, Rapid sequence and Cricoid Pressure applied Laryngoscope Size: Mac and 3 Grade View: Grade I Tube type: Oral Tube size: 7.5 mm Number of attempts: 1 Airway Equipment and Method: Stylet Placement Confirmation: ETT inserted through vocal cords under direct vision,  positive ETCO2 and breath sounds checked- equal and bilateral Secured at: 20 cm Tube secured with: Tape Dental Injury: Teeth and Oropharynx as per pre-operative assessment

## 2017-10-27 NOTE — Consult Note (Signed)
Cephas Darby, MD [address]  [place]  [place], [place] [address]  Main: [phone]  Fax: [phone] Pager: [phone]   Consultation  Referring Provider:     No ref. provider found Primary Care Physician:  Adin Hector, MD Primary Gastroenterologist:  Dr. Vicente Males         Reason for Consultation:  Food impaction  Date of Admission:  10/27/2017 Date of Consultation:  10/27/2017         HPI:   Billy Stick. is a 67 y.o. male with h/o chronic GERD, prior food impaction, esophageal stricture, s/p dilation about 3-4 yrs ago presents to ER last night as a piece of chicken got stuck at dinner. He is spitting up saliva, drank gatorade but no relief. He is on prilosec '20mg'$  as needed for heart burn.   NSAIDs: none  Antiplts/Anticoagulants/Anti thrombotics: none  GI Procedures: Colonoscopy by Dr Vicente Males in 05/2017 normal  Past Medical History:  Diagnosis Date  . Anxiety   . Coronary artery disease   . Depression   . GERD (gastroesophageal reflux disease)   . Gout   . Hypertension     Past Surgical History:  Procedure Laterality Date  . CARDIAC SURGERY    . COLONOSCOPY WITH PROPOFOL N/A 05/25/2017   Procedure: COLONOSCOPY WITH PROPOFOL;  Surgeon: Jonathon Bellows, MD;  Location[facility] ENDOSCOPY;  Service: Gastroenterology;  Laterality: N/A;  . CORONARY ARTERY BYPASS GRAFT      Prior to Admission medications   Medication Sig Start Date End Date Taking? Authorizing Provider  ALPRAZolam (XANAX) 0.5 MG tablet TAKE 1 TABLET BY MOUTH 3 TIMES A DAY AS NEEDED FOR ANXIETY. 06/24/17  Yes Roselee Nova, MD  aspirin EC 81 MG tablet Take 1 tablet (81 mg total) by mouth daily. For heart attack prevention. 11/22/11  Yes Mashburn, Marlane Hatcher, PA-C  buPROPion (WELLBUTRIN XL) 300 MG 24 hr tablet Take 1 tablet (300 mg total) by mouth daily. 06/24/17  Yes Roselee Nova, MD  lisinopril (PRINIVIL,ZESTRIL) 10 MG tablet Take 1 tablet (10 mg total) by mouth daily. 06/24/17  Yes Roselee Nova, MD  omeprazole (PRILOSEC) 20 MG capsule Take 1 capsule (20 mg total) by mouth 2 (two) times daily before a meal. 06/24/17  Yes Roselee Nova, MD  simvastatin (ZOCOR) 40 MG tablet Take 1 tablet (40 mg total) by mouth at bedtime. 06/24/17  Yes Roselee Nova, MD  valACYclovir (VALTREX) 1000 MG tablet Take 1,000 mg by mouth 2 (two) times daily as needed (outbreak).   Yes [provider]  zolpidem (AMBIEN) 10 MG tablet Take 1 tablet (10 mg total) by mouth at bedtime. 06/24/17  Yes Roselee Nova, MD    Family History  Problem Relation Age of Onset  . Heart attack Mother   . Hyperlipidemia Mother   . Hypertension Mother   . Cancer Father        lung  . Diabetes Sister   . Diabetes Brother   . Asthma Daughter   . Cancer Maternal Grandmother   . Stroke Maternal Grandfather   . Heart attack Paternal Grandfather      Social History   Tobacco Use  . Smoking status: Never Smoker  . Smokeless tobacco: Never Used  Substance Use Topics  . Alcohol use: Yes    Alcohol/week: 0.0 oz    Comment: 3 beer a month  . Drug use: Yes  Frequency: 2.0 times per week    Types: Cocaine    Comment: last use 15-20 years per patient    Allergies as of 10/27/2017  . (No Known Allergies)    Review of Systems:    All systems reviewed and negative except where noted in HPI.   Physical Exam:  Vital signs in last 24 hours: Temp:  [96.3 F (35.7 C)-98.3 F (36.8 C)] 96.3 F (35.7 C) (06/18 0626) Pulse Rate:  [75-88] 81 (06/18 0626) Resp:  [16-18] 16 (06/18 0626) BP: (107-146)/(65-87) 134/83 (06/18 0626) SpO2:  [97 %-99 %] 99 % (06/18 0626) Weight:  [168 lb (76.2 kg)] 168 lb (76.2 kg) (06/18 0049)   General:   Pleasant, cooperative in NAD Head:  Normocephalic and atraumatic. Eyes:   No icterus.   Conjunctiva pink. PERRLA. Ears:  Normal auditory acuity. Neck:  Supple; no masses or thyroidomegaly Lungs: Respirations even and unlabored. Lungs clear to auscultation  bilaterally.   No wheezes, crackles, or rhonchi.  Heart:  Regular rate and rhythm;  Without murmur, clicks, rubs or gallops Abdomen:  Soft, nondistended, nontender. Normal bowel sounds. No appreciable masses or hepatomegaly.  No rebound or guarding.  Rectal:  Not performed. Msk:  Symmetrical without gross deformities.  Strength normal  Extremities:  Without edema, cyanosis or clubbing. Neurologic:  Alert and oriented x3;  grossly normal neurologically. Skin:  Intact without significant lesions or rashes. Cervical Nodes:  No significant cervical adenopathy. Psych:  Alert and cooperative. Normal affect.  LAB RESULTS: CBC Latest Ref Rng & Units 09/05/2017 09/04/2017 04/16/2017  WBC 4.0 - 10.5 K/uL 5.7 7.2 3.9  Hemoglobin 13.0 - 17.0 g/dL 13.8 13.8 13.1(L)  Hematocrit 39.0 - 52.0 % 40.2 40.6 38.5  Platelets 150 - 400 K/uL 210 244 253    BMET BMP Latest Ref Rng & Units 09/04/2017 04/16/2017 11/20/2016  Glucose 65 - 99 mg/dL 73 91 111(H)  BUN 6 - 20 mg/dL 26(H) 17 17  Creatinine 0.61 - 1.24 mg/dL 1.69(H) 1.63(H) 1.73(H)  BUN/Creat Ratio 6 - 22 (calc) - 10 -  Sodium 135 - 145 mmol/L 139 143 140  Potassium 3.5 - 5.1 mmol/L 4.1 4.6 4.7  Chloride 101 - 111 mmol/L 106 105 104  CO2 22 - 32 mmol/L 22 32 28  Calcium 8.9 - 10.3 mg/dL 9.0 9.3 9.4    LFT Hepatic Function Latest Ref Rng & Units 04/16/2017 11/20/2016 02/19/2016  Total Protein 6.1 - 8.1 g/dL 6.4 6.6 6.7  Albumin 3.6 - 5.1 g/dL - 4.5 4.3  AST 10 - 35 U/L '16 16 14  '$ ALT 9 - 46 U/L '14 19 14  '$ Alk Phosphatase 40 - 115 U/L - 92 109  Total Bilirubin 0.2 - 1.2 mg/dL 0.7 0.7 0.5     STUDIES: No results found.    Impression / Plan:   Billy Cisneros. is a 67 y.o. male with chronic GERD, prior food impaction presents with another food impaction  - Proceed with EGD  I have discussed alternative options, risks & benefits,  which include, but are not limited to, bleeding, infection, perforation,respiratory complication & drug  reaction.  The patient agrees with this plan & written consent will be obtained.    Thank you for involving me in the care of this patient.      LOS: 0 days   Sherri Sear, MD  10/27/2017, 6:42 AM   Note: This dictation was prepared with Dragon dictation along with smaller phrase technology. Any transcriptional errors that result  from this process are unintentional.

## 2017-10-27 NOTE — ED Triage Notes (Signed)
Patient ambulatory to triage with steady gait, without difficulty or distress noted; pt reports chicken stuck in throat since 630pm; st hx of same with required endoscopic removal; st unable to swallow any liquids

## 2017-10-27 NOTE — Op Note (Signed)
[facility] Gastroenterology Patient Name: Billy ChristineKenneth Postell Procedure Date: 10/27/2017 6:05 AM MRN: [id number] Account #: [id number] Date of Birth: 1951/05/04 Admit Type: Inpatient Age: [age] [facility] END[facility]OM 4 Gender: Male Note Status: Finalized Procedure:            Upper GI endoscopy Indications:          Esophageal dysphagia, Foreign body in the esophagus Providers:            Toney Reilohini Reddy Merland Holness MD, MD Medicines:            General Anesthesia Complications:        No immediate complications. Estimated blood loss: None. Procedure:            Pre-Anesthesia Assessment:                       - Prior to the procedure, a History and Physical was                        performed, and patient medications and allergies were                        reviewed. The patient is competent. The risks and                        benefits of the procedure and the sedation options and                        risks were discussed with the patient. All questions                        were answered and informed consent was obtained.                        Patient identification and proposed procedure were                        verified by the physician, the nurse, the                        anesthesiologist, the anesthetist and the technician in                        the pre-procedure area in the procedure room in the                        endoscopy suite. Mental Status Examination: alert and                        oriented. Airway Examination: normal oropharyngeal                        airway and neck mobility. Respiratory Examination:                        clear to auscultation. CV Examination: normal.                        Prophylactic Antibiotics: The patient does not require  prophylactic antibiotics. Prior Anticoagulants: The                        patient has taken no previous anticoagulant or                        antiplatelet agents. ASA Grade  Assessment: III - A                        patient with severe systemic disease. After reviewing                        the risks and benefits, the patient was deemed in                        satisfactory condition to undergo the procedure. The                        anesthesia plan was to use general anesthesia.                        Immediately prior to administration of medications, the                        patient was re-assessed for adequacy to receive                        sedatives. The heart rate, respiratory rate, oxygen                        saturations, blood pressure, adequacy of pulmonary                        ventilation, and response to care were monitored                        throughout the procedure. The physical status of the                        patient was re-assessed after the procedure.                       After obtaining informed consent, the endoscope was                        passed under direct vision. Throughout the procedure,                        the patient's blood pressure, pulse, and oxygen                        saturations were monitored continuously. The Endoscope                        was introduced through the mouth, and advanced to the                        second part of duodenum. The upper GI endoscopy was  accomplished without difficulty. The patient tolerated                        the procedure well. Findings:      Food was found in the lower third of the esophagus. Removal of food was       accomplished with raptor and tripod.      One benign-appearing, intrinsic moderate (circumferential scarring or       stenosis; an endoscope may pass) stenosis was found in the lower third       of the esophagus. The stenosis was traversed.      The entire examined stomach was normal.      The cardia and gastric fundus were normal on retroflexion.      The duodenal bulb and second portion of the duodenum were  normal. Impression:           - Food in the lower third of the esophagus. Removal was                        successful.                       - Benign-appearing esophageal stenosis.                       - Normal stomach.                       - Normal duodenal bulb and second portion of the                        duodenum. Recommendation:       - Discharge patient to home (with escort).                       - Full liquid diet today.                       - Continue present medications.                       - Use Prilosec (omeprazole) 40 mg PO BID for 3 months.                       - Repeat upper endoscopy in 2 weeks to check healing                        and possible dilation.                       - Return to GI office in 4 weeks. Procedure Code(s):    --- Professional ---                       [phone], Esophagogastroduodenoscopy, flexible, transoral;                        with removal of foreign body(s) Diagnosis Code(s):    --- Professional ---                       U04.540J, Food in esophagus causing other injury,  initial encounter                       K22.2, Esophageal obstruction                       R13.14, Dysphagia, pharyngoesophageal phase                       T18.108A, Unspecified foreign body in esophagus causing                        other injury, initial encounter CPT copyright 2017 American Medical Association. All rights reserved. The codes documented in this report are preliminary and upon coder review may  be revised to meet current compliance requirements. Dr. Libby Maw Toney Reil MD, MD 10/27/2017 7:19:35 AM This report has been signed electronically. Number of Addenda: 0 Note Initiated On: 10/27/2017 6:05 AM      [facility]

## 2017-10-27 NOTE — ED Provider Notes (Signed)
[facility] Emergency Department Provider Note    First MD Initiated Contact with Patient 10/27/17 [phone]     (approximate)  I have reviewed the triage vital signs and the nursing notes.   HISTORY  Chief Complaint Foreign Body    HPI Billy Cisneros. is a 67 y.o. male with below list of chronic medical conditions including 2 previous episodes of esophageal food bolus impaction presents to the emergency department with food bolus impaction since 6:30 PM.  Patient states unable to tolerate drinking since that time with vomiting with each episode.   Past Medical History:  Diagnosis Date  . Anxiety   . Coronary artery disease   . Depression   . GERD (gastroesophageal reflux disease)   . Gout   . Hypertension     Patient Active Problem List   Diagnosis Date Noted  . CKD (chronic kidney disease), stage III (HCC) 09/05/2017  . Acute gout 02/19/2016  . GERD (gastroesophageal reflux disease) 01/21/2016  . Pain and swelling of right wrist 08/13/2015  . Productive cough 08/13/2015  . Chronic right shoulder pain 06/12/2015  . Cannot sleep 06/12/2015  . Dyslipidemia 03/12/2015  . Cervical radiculopathy 01/08/2015  . Annual physical exam 01/01/2015  . H/O coronary artery bypass surgery 12/11/2014  . Hypertension 12/04/2014  . Anxiety 12/04/2014  . Chest pain 12/04/2014  . Major depression 11/21/2011    Past Surgical History:  Procedure Laterality Date  . CARDIAC SURGERY    . COLONOSCOPY WITH PROPOFOL N/A 05/25/2017   Procedure: COLONOSCOPY WITH PROPOFOL;  Surgeon: Wyline Mood, MD;  Location: [facility] ENDOSCOPY;  Service: Gastroenterology;  Laterality: N/A;  . CORONARY ARTERY BYPASS GRAFT      Prior to Admission medications   Medication Sig Start Date End Date Taking? Authorizing Provider  ALPRAZolam (XANAX) 0.5 MG tablet TAKE 1 TABLET BY MOUTH 3 TIMES A DAY AS NEEDED FOR ANXIETY. 06/24/17   Ellyn Hack, MD  aspirin EC 81 MG tablet Take 1  tablet (81 mg total) by mouth daily. For heart attack prevention. 11/22/11   Tamala Julian, PA-C  buPROPion (WELLBUTRIN XL) 300 MG 24 hr tablet Take 1 tablet (300 mg total) by mouth daily. 06/24/17   Ellyn Hack, MD  lisinopril (PRINIVIL,ZESTRIL) 10 MG tablet Take 1 tablet (10 mg total) by mouth daily. 06/24/17   Ellyn Hack, MD  omeprazole (PRILOSEC) 20 MG capsule Take 1 capsule (20 mg total) by mouth 2 (two) times daily before a meal. 06/24/17   Ellyn Hack, MD  simvastatin (ZOCOR) 40 MG tablet Take 1 tablet (40 mg total) by mouth at bedtime. 06/24/17   Ellyn Hack, MD  valACYclovir (VALTREX) 1000 MG tablet Take 1,000 mg by mouth 2 (two) times daily as needed (outbreak).    [provider]  zolpidem (AMBIEN) 10 MG tablet Take 1 tablet (10 mg total) by mouth at bedtime. 06/24/17   Ellyn Hack, MD    Allergies No known drug allergies  Family History  Problem Relation Age of Onset  . Heart attack Mother   . Hyperlipidemia Mother   . Hypertension Mother   . Cancer Father        lung  . Diabetes Sister   . Diabetes Brother   . Asthma Daughter   . Cancer Maternal Grandmother   . Stroke Maternal Grandfather   . Heart attack Paternal Grandfather     Social History Social History  Tobacco Use  . Smoking status: Never Smoker  . Smokeless tobacco: Never Used  Substance Use Topics  . Alcohol use: Yes    Alcohol/week: 0.0 oz    Comment: 3 beer a month  . Drug use: Yes    Frequency: 2.0 times per week    Types: Cocaine    Comment: last use 15-20 years per patient    Review of Systems Constitutional: No fever/chills Eyes: No visual changes. ENT: No sore throat. Cardiovascular: Denies chest pain. Respiratory: Denies shortness of breath. Gastrointestinal: No abdominal pain.  No nausea, no vomiting.  No diarrhea.  No constipation. Genitourinary: Negative for dysuria. Musculoskeletal: Negative for neck pain.  Negative for back  pain. Integumentary: Negative for rash. Neurological: Negative for headaches, focal weakness or numbness.  ____________________________________________   PHYSICAL EXAM:  VITAL SIGNS: ED Triage Vitals  Enc Vitals Group     BP 10/27/17 0048 124/69     Pulse Rate 10/27/17 0048 75     Resp 10/27/17 0048 18     Temp 10/27/17 0048 98.3 F (36.8 C)     Temp Source 10/27/17 0048 Oral     SpO2 10/27/17 0048 98 %     Weight 10/27/17 0049 76.2 kg (168 lb)     Height 10/27/17 0049 1.651 m (5\' 5" )     Head Circumference --      Peak Flow --      Pain Score 10/27/17 0048 8     Pain Loc --      Pain Edu? --      Excl. in GC? --     Constitutional: Alert and oriented. Well appearing and in no acute distress. Eyes: Conjunctivae are normal. Head: Atraumatic. Mouth/Throat: Mucous membranes are moist.  Oropharynx non-erythematous. Neck: No stridor.   Cardiovascular: Normal rate, regular rhythm. Good peripheral circulation. Grossly normal heart sounds. Respiratory: Normal respiratory effort.  No retractions. Lungs CTAB. Gastrointestinal: Soft and nontender. No distention.   Neurologic:  Normal speech and language. No gross focal neurologic deficits are appreciated.  Skin:  Skin is warm, dry and intact. No rash noted. Psychiatric: Mood and affect are normal. Speech and behavior are normal.   Procedures   ____________________________________________   INITIAL IMPRESSION / ASSESSMENT AND PLAN / ED COURSE  As part of my medical decision making, I reviewed the following data within the electronic MEDICAL RECORD NUMBER 67 year old male presenting with above-stated history and physical exam secondary to esophageal food bolus impaction.  Patient given glucagon 1 mg without any improvement of symptoms.  Carbonated beverage tried as well.  Patient discussed with Dr. Allegra LaiVanga gastroenterologist at 1:15 AM with plan for endoscopy.  ____________________________________________  FINAL CLINICAL  IMPRESSION(S) / ED DIAGNOSES  Final diagnoses:  Esophageal obstruction due to food impaction     MEDICATIONS GIVEN DURING THIS VISIT:  Medications  glucagon (GLUCAGEN) injection 1 mg (1 mg Intravenous Given 10/27/17 0118)     ED Discharge Orders    None       Note:  This document was prepared using Dragon voice recognition software and may include unintentional dictation errors.    Darci CurrentBrown, Longton N, MD 10/27/17 [phone]

## 2017-10-27 NOTE — Transfer of Care (Signed)
Immediate Anesthesia Transfer of Care Note  Patient: Billy ChanceKenneth H Finnan Jr.  Procedure(s) Performed: ESOPHAGOGASTRODUODENOSCOPY (EGD) (N/A )  Patient Location: PACU  Anesthesia Type:General  Level of Consciousness: awake, alert  and oriented  Airway & Oxygen Therapy: Patient Spontanous Breathing and Patient connected to nasal cannula oxygen  Post-op Assessment: Report given to RN and Post -op Vital signs reviewed and stable  Post vital signs: stable  Last Vitals:  Vitals Value Taken Time  BP 131/81 10/27/2017  7:22 AM  Temp 37.2 C 10/27/2017  7:22 AM  Pulse 101 10/27/2017  7:24 AM  Resp 19 10/27/2017  7:24 AM  SpO2 98 % 10/27/2017  7:24 AM  Vitals shown include unvalidated device data.  Last Pain:  Vitals:   10/27/17 0722  TempSrc:   PainSc: 0-No pain      Patients Stated Pain Goal: 0 (10/27/17 0600)  Complications: No apparent anesthesia complications

## 2017-10-27 NOTE — H&P (Signed)
Arlyss Repressohini R Sonya Pucci, MD [address]  [address]  [place], [place] [address]  Main: [phone]  Fax: [phone] Pager: [phone]  Primary Care Physician:  Lynnea FerrierKlein, Bert J III, MD Primary Gastroenterologist:  Dr. Arlyss Repressohini R Genell Thede  Pre-Procedure History & Physical: HPI:  Billy ChanceKenneth H Mobley Jr. is a 67 y.o. male is here for an endoscopy.   Past Medical History:  Diagnosis Date  . Anxiety   . Coronary artery disease   . Depression   . GERD (gastroesophageal reflux disease)   . Gout   . Hypertension     Past Surgical History:  Procedure Laterality Date  . CARDIAC SURGERY    . COLONOSCOPY WITH PROPOFOL N/A 05/25/2017   Procedure: COLONOSCOPY WITH PROPOFOL;  Surgeon: Wyline MoodAnna, Kiran, MD;  [facility] END[facility]COPY;  Service: Gastroenterology;  Laterality: N/A;  . CORONARY ARTERY BYPASS GRAFT      Prior to Admission medications   Medication Sig Start Date End Date Taking? Authorizing Provider  ALPRAZolam (XANAX) 0.5 MG tablet TAKE 1 TABLET BY MOUTH 3 TIMES A DAY AS NEEDED FOR ANXIETY. 06/24/17  Yes Ellyn HackShah, Syed Asad A, MD  aspirin EC 81 MG tablet Take 1 tablet (81 mg total) by mouth daily. For heart attack prevention. 11/22/11  Yes Mashburn, Rona RavensNeil T, PA-C  buPROPion (WELLBUTRIN XL) 300 MG 24 hr tablet Take 1 tablet (300 mg total) by mouth daily. 06/24/17  Yes Ellyn HackShah, Syed Asad A, MD  lisinopril (PRINIVIL,ZESTRIL) 10 MG tablet Take 1 tablet (10 mg total) by mouth daily. 06/24/17  Yes Ellyn HackShah, Syed Asad A, MD  omeprazole (PRILOSEC) 20 MG capsule Take 1 capsule (20 mg total) by mouth 2 (two) times daily before a meal. 06/24/17  Yes Ellyn HackShah, Syed Asad A, MD  simvastatin (ZOCOR) 40 MG tablet Take 1 tablet (40 mg total) by mouth at bedtime. 06/24/17  Yes Ellyn HackShah, Syed Asad A, MD  valACYclovir (VALTREX) 1000 MG tablet Take 1,000 mg by mouth 2 (two) times daily as needed (outbreak).   Yes [provider]  zolpidem (AMBIEN) 10 MG tablet Take 1 tablet (10 mg total) by mouth at bedtime. 06/24/17  Yes Ellyn HackShah,  Syed Asad A, MD    Allergies as of 10/27/2017  . (No Known Allergies)    Family History  Problem Relation Age of Onset  . Heart attack Mother   . Hyperlipidemia Mother   . Hypertension Mother   . Cancer Father        lung  . Diabetes Sister   . Diabetes Brother   . Asthma Daughter   . Cancer Maternal Grandmother   . Stroke Maternal Grandfather   . Heart attack Paternal Grandfather     Social History   Socioeconomic History  . Marital status: Married    Spouse name: Not on file  . Number of children: Not on file  . Years of education: Not on file  . Highest education level: Not on file  Occupational History  . Not on file  Social Needs  . Financial resource strain: Not on file  . Food insecurity:    Worry: Not on file    Inability: Not on file  . Transportation needs:    Medical: Not on file    Non-medical: Not on file  Tobacco Use  . Smoking status: Never Smoker  . Smokeless tobacco: Never Used  Substance and Sexual Activity  . Alcohol use: Yes    Alcohol/week: 0.0 oz    Comment: 3 beer a month  . Drug  use: Yes    Frequency: 2.0 times per week    Types: Cocaine    Comment: last use 15-20 years per patient  . Sexual activity: Yes    Birth control/protection: None  Lifestyle  . Physical activity:    Days per week: Not on file    Minutes per session: Not on file  . Stress: Not on file  Relationships  . Social connections:    Talks on phone: Not on file    Gets together: Not on file    Attends religious service: Not on file    Active member of club or organization: Not on file    Attends meetings of clubs or organizations: Not on file    Relationship status: Not on file  . Intimate partner violence:    Fear of current or ex partner: Not on file    Emotionally abused: Not on file    Physically abused: Not on file    Forced sexual activity: Not on file  Other Topics Concern  . Not on file  Social History Narrative  . Not on file    Review of  Systems: See HPI, otherwise negative ROS  Physical Exam: BP 134/83   Pulse 81   Temp (!) 96.3 F (35.7 C) (Tympanic)   Resp 16   Ht 5\' 5"  (1.651 m)   Wt 168 lb (76.2 kg)   SpO2 99%   BMI 27.96 kg/m  General:   Alert,  pleasant and cooperative in NAD Head:  Normocephalic and atraumatic. Neck:  Supple; no masses or thyromegaly. Lungs:  Clear throughout to auscultation.    Heart:  Regular rate and rhythm. Abdomen:  Soft, nontender and nondistended. Normal bowel sounds, without guarding, and without rebound.   Neurologic:  Alert and  oriented x4;  grossly normal neurologically.  Impression/Plan: Billy Cisneros. is here for an endoscopy to be performed for food impaction  Risks, benefits, limitations, and alternatives regarding  endoscopy have been reviewed with the patient.  Questions have been answered.  All parties agreeable.   Lannette Donath, MD  10/27/2017, 6:36 AM

## 2017-10-28 ENCOUNTER — Encounter: Payer: Self-pay | Admitting: Gastroenterology

## 2017-10-28 ENCOUNTER — Other Ambulatory Visit: Payer: Self-pay

## 2017-10-28 DIAGNOSIS — R1314 Dysphagia, pharyngoesophageal phase: Secondary | ICD-10-CM

## 2017-10-28 DIAGNOSIS — K222 Esophageal obstruction: Secondary | ICD-10-CM

## 2017-10-28 NOTE — Addendum Note (Signed)
Addendum  created 10/28/17 [id number] by Junious SilkNoles, Pluma Diniz, CRNA   Charge Capture section accepted

## 2017-11-06 ENCOUNTER — Other Ambulatory Visit: Payer: Self-pay | Admitting: Physical Medicine and Rehabilitation

## 2017-11-06 DIAGNOSIS — M5412 Radiculopathy, cervical region: Secondary | ICD-10-CM

## 2017-11-10 ENCOUNTER — Encounter: Payer: Self-pay | Admitting: Gastroenterology

## 2017-11-19 ENCOUNTER — Ambulatory Visit
Admission: RE | Admit: 2017-11-19 | Discharge: 2017-11-19 | Disposition: A | Discharge status: Home / Self Care | Payer: Medicare Other | Source: Ambulatory Visit | Attending: Gastroenterology | Admitting: Gastroenterology

## 2017-11-19 ENCOUNTER — Encounter
Admission: RE | Disposition: A | Discharge status: Home / Self Care | Payer: Self-pay | Source: Ambulatory Visit | Attending: Gastroenterology

## 2017-11-19 ENCOUNTER — Ambulatory Visit: Payer: Medicare Other | Admitting: Anesthesiology

## 2017-11-19 ENCOUNTER — Encounter: Payer: Self-pay | Admitting: *Deleted

## 2017-11-19 DIAGNOSIS — R1319 Other dysphagia: Secondary | ICD-10-CM

## 2017-11-19 DIAGNOSIS — Z79899 Other long term (current) drug therapy: Secondary | ICD-10-CM | POA: Diagnosis not present

## 2017-11-19 DIAGNOSIS — K2960 Other gastritis without bleeding: Secondary | ICD-10-CM | POA: Diagnosis not present

## 2017-11-19 DIAGNOSIS — Z7982 Long term (current) use of aspirin: Secondary | ICD-10-CM | POA: Insufficient documentation

## 2017-11-19 DIAGNOSIS — F419 Anxiety disorder, unspecified: Secondary | ICD-10-CM | POA: Insufficient documentation

## 2017-11-19 DIAGNOSIS — R1314 Dysphagia, pharyngoesophageal phase: Secondary | ICD-10-CM | POA: Diagnosis not present

## 2017-11-19 DIAGNOSIS — F329 Major depressive disorder, single episode, unspecified: Secondary | ICD-10-CM | POA: Insufficient documentation

## 2017-11-19 DIAGNOSIS — Z8249 Family history of ischemic heart disease and other diseases of the circulatory system: Secondary | ICD-10-CM | POA: Diagnosis not present

## 2017-11-19 DIAGNOSIS — K222 Esophageal obstruction: Secondary | ICD-10-CM

## 2017-11-19 DIAGNOSIS — K3189 Other diseases of stomach and duodenum: Secondary | ICD-10-CM | POA: Diagnosis not present

## 2017-11-19 DIAGNOSIS — I10 Essential (primary) hypertension: Secondary | ICD-10-CM | POA: Diagnosis not present

## 2017-11-19 DIAGNOSIS — L83 Acanthosis nigricans: Secondary | ICD-10-CM | POA: Diagnosis not present

## 2017-11-19 DIAGNOSIS — K219 Gastro-esophageal reflux disease without esophagitis: Secondary | ICD-10-CM | POA: Diagnosis not present

## 2017-11-19 DIAGNOSIS — M109 Gout, unspecified: Secondary | ICD-10-CM | POA: Insufficient documentation

## 2017-11-19 DIAGNOSIS — R1310 Dysphagia, unspecified: Secondary | ICD-10-CM

## 2017-11-19 DIAGNOSIS — I2510 Atherosclerotic heart disease of native coronary artery without angina pectoris: Secondary | ICD-10-CM | POA: Diagnosis not present

## 2017-11-19 HISTORY — PX: ESOPHAGOGASTRODUODENOSCOPY (EGD) WITH PROPOFOL: SHX5813

## 2017-11-19 SURGERY — ESOPHAGOGASTRODUODENOSCOPY (EGD) WITH PROPOFOL
Anesthesia: General

## 2017-11-19 MED ORDER — SODIUM CHLORIDE 0.9 % IV SOLN
INTRAVENOUS | Status: DC
  Administered 2017-11-19: 1000 mL via INTRAVENOUS

## 2017-11-19 MED ORDER — LIDOCAINE HCL (CARDIAC) PF 100 MG/5ML IV SOSY
PREFILLED_SYRINGE | INTRAVENOUS | Status: DC | PRN
  Administered 2017-11-19: 100 mg via INTRAVENOUS

## 2017-11-19 MED ORDER — PHENYLEPHRINE HCL 10 MG/ML IJ SOLN
INTRAMUSCULAR | Status: DC | PRN
  Administered 2017-11-19: 100 ug via INTRAVENOUS

## 2017-11-19 MED ORDER — PROPOFOL 10 MG/ML IV BOLUS
INTRAVENOUS | Status: DC | PRN
  Administered 2017-11-19: 30 mg via INTRAVENOUS
  Administered 2017-11-19 (×2): 50 mg via INTRAVENOUS
  Administered 2017-11-19: 20 mg via INTRAVENOUS

## 2017-11-19 NOTE — Anesthesia Post-op Follow-up Note (Signed)
Anesthesia QCDR form completed.        

## 2017-11-19 NOTE — H&P (Signed)
Arlyss Repress, MD [address]  [address]  [place], [place] [address]  Main: [phone]  Fax: [phone] Pager: [phone]  Primary Care Physician:  Lynnea Ferrier, MD Primary Gastroenterologist:  Dr. Arlyss Repress  Pre-Procedure History & Physical: HPI:  Demorris Choyce. is a 67 y.o. male is here for an endoscopy.   Past Medical History:  Diagnosis Date  . Anxiety   . Coronary artery disease   . Depression   . GERD (gastroesophageal reflux disease)   . Gout   . Hypertension     Past Surgical History:  Procedure Laterality Date  . CARDIAC CATHETERIZATION    . CARDIAC SURGERY    . COLONOSCOPY WITH PROPOFOL N/A 05/25/2017   Procedure: COLONOSCOPY WITH PROPOFOL;  Surgeon: Wyline Mood, MD;  Location: [facility] ENDOSCOPY;  Service: Gastroenterology;  Laterality: N/A;  . CORONARY ARTERY BYPASS GRAFT    . ESOPHAGOGASTRODUODENOSCOPY N/A 10/27/2017   Procedure: ESOPHAGOGASTRODUODENOSCOPY (EGD);  Surgeon: Toney Reil, MD;  Location: [facility] ENDOSCOPY;  Service: Gastroenterology;  Laterality: N/A;    Prior to Admission medications   Medication Sig Start Date End Date Taking? Authorizing Provider  ALPRAZolam (XANAX) 0.5 MG tablet TAKE 1 TABLET BY MOUTH 3 TIMES A DAY AS NEEDED FOR ANXIETY. 06/24/17  Yes Ellyn Hack, MD  aspirin EC 81 MG tablet Take 1 tablet (81 mg total) by mouth daily. For heart attack prevention. 11/22/11  Yes Mashburn, Rona Ravens, PA-C  buPROPion (WELLBUTRIN XL) 300 MG 24 hr tablet Take 1 tablet (300 mg total) by mouth daily. 06/24/17  Yes Ellyn Hack, MD  lisinopril (PRINIVIL,ZESTRIL) 10 MG tablet Take 1 tablet (10 mg total) by mouth daily. 06/24/17  Yes Ellyn Hack, MD  omeprazole (PRILOSEC) 20 MG capsule Take 1 capsule (20 mg total) by mouth 2 (two) times daily before a meal. 06/24/17  Yes Ellyn Hack, MD  simvastatin (ZOCOR) 40 MG tablet Take 1 tablet (40 mg total) by mouth at bedtime. 06/24/17  Yes Ellyn Hack, MD    valACYclovir (VALTREX) 1000 MG tablet Take 1,000 mg by mouth 2 (two) times daily as needed (outbreak).   Yes [provider]  zolpidem (AMBIEN) 10 MG tablet Take 1 tablet (10 mg total) by mouth at bedtime. 06/24/17  Yes Ellyn Hack, MD    Allergies as of 10/28/2017  . (No Known Allergies)    Family History  Problem Relation Age of Onset  . Heart attack Mother   . Hyperlipidemia Mother   . Hypertension Mother   . Cancer Father        lung  . Diabetes Sister   . Diabetes Brother   . Asthma Daughter   . Cancer Maternal Grandmother   . Stroke Maternal Grandfather   . Heart attack Paternal Grandfather     Social History   Socioeconomic History  . Marital status: Married    Spouse name: Not on file  . Number of children: Not on file  . Years of education: Not on file  . Highest education level: Not on file  Occupational History  . Not on file  Social Needs  . Financial resource strain: Not on file  . Food insecurity:    Worry: Not on file    Inability: Not on file  . Transportation needs:    Medical: Not on file    Non-medical: Not on file  Tobacco Use  . Smoking status: Never Smoker  .  Smokeless tobacco: Never Used  Substance and Sexual Activity  . Alcohol use: Yes    Alcohol/week: 0.0 oz    Comment: 3 beer a month  . Drug use: Yes    Frequency: 2.0 times per week    Types: Cocaine    Comment: last use 15-20 years per patient  . Sexual activity: Yes    Birth control/protection: None  Lifestyle  . Physical activity:    Days per week: Not on file    Minutes per session: Not on file  . Stress: Not on file  Relationships  . Social connections:    Talks on phone: Not on file    Gets together: Not on file    Attends religious service: Not on file    Active member of club or organization: Not on file    Attends meetings of clubs or organizations: Not on file    Relationship status: Not on file  . Intimate partner violence:    Fear of current or  ex partner: Not on file    Emotionally abused: Not on file    Physically abused: Not on file    Forced sexual activity: Not on file  Other Topics Concern  . Not on file  Social History Narrative  . Not on file    Review of Systems: See HPI, otherwise negative ROS  Physical Exam: BP 109/72   Pulse 80   Temp (!) 96.9 F (36.1 C) (Tympanic)   Resp 18   Ht 5\' 5"  (1.651 m)   Wt 160 lb (72.6 kg)   SpO2 100%   BMI 26.63 kg/m  General:   Alert,  pleasant and cooperative in NAD Head:  Normocephalic and atraumatic. Neck:  Supple; no masses or thyromegaly. Lungs:  Clear throughout to auscultation.    Heart:  Regular rate and rhythm. Abdomen:  Soft, nontender and nondistended. Normal bowel sounds, without guarding, and without rebound.   Neurologic:  Alert and  oriented x4;  grossly normal neurologically.  Impression/Plan: Loistine ChanceKenneth H Modisette Jr. is here for an endoscopy to be performed for esophageal stricture  Risks, benefits, limitations, and alternatives regarding  endoscopy have been reviewed with the patient.  Questions have been answered.  All parties agreeable.   Lannette Donathohini Vanga, MD  11/19/2017, 10:33 AM

## 2017-11-19 NOTE — Op Note (Signed)
[facility]astroenter[facility] Patient Name: Billy Cisneros Procedure Date: 11/19/2017 10:26 AM MRN: [id number] Account #: [id number] Date of Birth: [birth year] Admit Type: Outpatient Age: 67 Room: [facility] ENDO ROOM 4 Gender: Male Note Status: Finalized Procedure:            Upper GI endoscopy Indications:          Esophageal dysphagia Providers:            Lin Landsman MD, MD Referring MD:         Ramonita Lab, MD (Referring MD) Medicines:            Monitored Anesthesia Care Complications:        No immediate complications. Estimated blood loss: None. Procedure:            Pre-Anesthesia Assessment:                       - Prior to the procedure, a History and Physical was                        performed, and patient medications and allergies were                        reviewed. The patient is competent. The risks and                        benefits of the procedure and the sedation options and                        risks were discussed with the patient. All questions                        were answered and informed consent was obtained.                        Patient identification and proposed procedure were                        verified by the physician, the nurse, the                        anesthesiologist, the anesthetist and the technician in                        the pre-procedure area in the procedure room in the                        endoscopy suite. Mental Status Examination: alert and                        oriented. Airway Examination: normal oropharyngeal                        airway and neck mobility. Respiratory Examination:                        clear to auscultation. CV Examination: normal.                        Prophylactic Antibiotics: The patient does not require  prophylactic antibiotics. Prior Anticoagulants: The                        patient has taken aspirin, last dose was day of   procedure. ASA Grade Assessment: II - A patient with                        mild systemic disease. After reviewing the risks and                        benefits, the patient was deemed in satisfactory                        condition to undergo the procedure. The anesthesia plan                        was to use monitored anesthesia care (MAC). Immediately                        prior to administration of medications, the patient was                        re-assessed for adequacy to receive sedatives. The                        heart rate, respiratory rate, oxygen saturations, blood                        pressure, adequacy of pulmonary ventilation, and                        response to care were monitored throughout the                        procedure. The physical status of the patient was                        re-assessed after the procedure.                       After obtaining informed consent, the endoscope was                        passed under direct vision. Throughout the procedure,                        the patient's blood pressure, pulse, and oxygen                        saturations were monitored continuously. The Endoscope                        was introduced through the mouth, and advanced to the                        second part of duodenum. The upper GI endoscopy was                        accomplished without difficulty. The patient tolerated  the procedure well. Findings:      The duodenal bulb and second portion of the duodenum were normal.      A single localized, diminutive non-bleeding erosion was found in the       prepyloric region of the stomach. There were no stigmata of recent       bleeding.      The entire examined stomach was normal. Biopsies were taken with a cold       forceps for Helicobacter pylori testing.      The cardia and gastric fundus were normal on retroflexion.      Esophagogastric landmarks were identified: the  gastroesophageal junction       was found at 36 cm from the incisors.      The gastroesophageal junction and examined esophagus were normal.       Biopsies were obtained from the proximal and distal esophagus with cold       forceps for histology of suspected eosinophilic esophagitis. Impression:           - Normal duodenal bulb and second portion of the                        duodenum.                       - Non-bleeding erosive gastropathy.                       - Normal stomach. Biopsied.                       - Esophagogastric landmarks identified.                       - Normal gastroesophageal junction and esophagus.                        Biopsied. Recommendation:       - Discharge patient to home (with escort).                       - Chopped diet.                       - Continue present medications.                       - Await pathology results.                       - Return to my office as previously scheduled. Procedure Code(s):    --- Professional ---                       [phone], Esophagogastroduodenoscopy, flexible, transoral;                        with biopsy, single or multiple Diagnosis Code(s):    --- Professional ---                       K31.89, Other diseases of stomach and duodenum                       R13.14, Dysphagia, pharyngoesophageal phase CPT copyright 2017 American Medical Association. All rights reserved.  The codes documented in this report are preliminary and upon coder review may  be revised to meet current compliance requirements. Dr. Ulyess Mort Lin Landsman MD, MD 11/19/2017 11:00:10 AM This report has been signed electronically. Number of Addenda: 0 Note Initiated On: 11/19/2017 10:26 AM      [facility]

## 2017-11-19 NOTE — Transfer of Care (Signed)
Immediate Anesthesia Transfer of Care Note  Patient: Billy ChanceKenneth H Dattilo Jr.  Procedure(s) Performed: ESOPHAGOGASTRODUODENOSCOPY (EGD) WITH PROPOFOL (N/A )  Patient Location: PACU  Anesthesia Type:General  Level of Consciousness: sedated  Airway & Oxygen Therapy: Patient Spontanous Breathing and Patient connected to nasal cannula oxygen  Post-op Assessment: Report given to RN and Post -op Vital signs reviewed and stable  Post vital signs: Reviewed and stable  Last Vitals:  Vitals Value Taken Time  BP 86/57 11/19/2017 11:04 AM  Temp 36.1 C 11/19/2017 11:00 AM  Pulse 69 11/19/2017 11:13 AM  Resp 16 11/19/2017 11:13 AM  SpO2 98 % 11/19/2017 11:13 AM  Vitals shown include unvalidated device data.  Last Pain:  Vitals:   11/19/17 1100  TempSrc: Tympanic  PainSc:          Complications: No apparent anesthesia complications

## 2017-11-19 NOTE — Anesthesia Postprocedure Evaluation (Signed)
Anesthesia Post Note  Patient: Billy ChanceKenneth H Largent Jr.  Procedure(s) Performed: ESOPHAGOGASTRODUODENOSCOPY (EGD) WITH PROPOFOL (N/A )  Patient location during evaluation: Endoscopy Anesthesia Type: General Level of consciousness: awake and alert Pain management: pain level controlled Vital Signs Assessment: post-procedure vital signs reviewed and stable Respiratory status: spontaneous breathing, nonlabored ventilation and respiratory function stable Cardiovascular status: blood pressure returned to baseline and stable Postop Assessment: no apparent nausea or vomiting Anesthetic complications: no     Last Vitals:  Vitals:   11/19/17 1120 11/19/17 1130  BP: 97/68 100/66  Pulse: 71 69  Resp: 15 13  Temp:    SpO2: 98% 99%    Last Pain:  Vitals:   11/19/17 1100  TempSrc: Tympanic  PainSc:                  Christia ReadingScott T Gertude Benito

## 2017-11-19 NOTE — Anesthesia Preprocedure Evaluation (Signed)
Anesthesia Evaluation  Patient identified by MRN, date of birth, ID band Patient awake    Reviewed: Allergy & Precautions, H&P , NPO status , reviewed documented beta blocker date and time   Airway Mallampati: II  TM Distance: >3 FB Neck ROM: full    Dental  (+) Partial Upper, Missing   Pulmonary    Pulmonary exam normal        Cardiovascular hypertension, + CAD  Normal cardiovascular exam     Neuro/Psych PSYCHIATRIC DISORDERS Anxiety Depression  Neuromuscular disease    GI/Hepatic GERD  ,  Endo/Other    Renal/GU Renal disease     Musculoskeletal   Abdominal   Peds  Hematology   Anesthesia Other Findings Past Medical History: No date: Anxiety No date: Coronary artery disease No date: Depression No date: GERD (gastroesophageal reflux disease) No date: Gout No date: Hypertension  Past Surgical History: No date: CARDIAC CATHETERIZATION No date: CARDIAC SURGERY 05/25/2017: COLONOSCOPY WITH PROPOFOL; N/A     Comment:  Procedure: COLONOSCOPY WITH PROPOFOL;  Surgeon: Wyline MoodAnna,               Kiran, MD;  [facility] END[facility];  Service:               Gastroenterology;  Laterality: N/A; No date: CORONARY ARTERY BYPASS GRAFT 10/27/2017: ESOPHAGOGASTRODUODENOSCOPY; N/A     Comment:  Procedure: ESOPHAGOGASTRODUODENOSCOPY (EGD);  Surgeon:               Toney ReilVanga, Rohini Reddy, MD;  [facility];                Service: Gastroenterology;  Laterality: N/A;     Reproductive/Obstetrics                             Anesthesia Physical Anesthesia Plan  ASA: III  Anesthesia Plan: General   Post-op Pain Management:    Induction:   PONV Risk Score and Plan: Treatment may vary due to age or medical condition and TIVA  Airway Management Planned:   Additional Equipment:   Intra-op Plan:   Post-operative Plan:   Informed Consent: I have reviewed the patients History and Physical,  chart, labs and discussed the procedure including the risks, benefits and alternatives for the proposed anesthesia with the patient or authorized representative who has indicated his/her understanding and acceptance.   Dental Advisory Given  Plan Discussed with: CRNA  Anesthesia Plan Comments:         Anesthesia Quick Evaluation

## 2017-11-20 LAB — SURGICAL PATHOLOGY

## 2017-11-22 ENCOUNTER — Encounter: Payer: Self-pay | Admitting: Gastroenterology

## 2017-11-23 ENCOUNTER — Emergency Department: Payer: Medicare Other

## 2017-11-23 ENCOUNTER — Encounter: Payer: Self-pay | Admitting: Emergency Medicine

## 2017-11-23 ENCOUNTER — Emergency Department
Admission: EM | Admit: 2017-11-23 | Discharge: 2017-11-23 | Disposition: A | Discharge status: Home / Self Care | Payer: Medicare Other | Attending: Emergency Medicine | Admitting: Emergency Medicine

## 2017-11-23 DIAGNOSIS — S0990XA Unspecified injury of head, initial encounter: Secondary | ICD-10-CM | POA: Diagnosis not present

## 2017-11-23 DIAGNOSIS — F329 Major depressive disorder, single episode, unspecified: Secondary | ICD-10-CM | POA: Insufficient documentation

## 2017-11-23 DIAGNOSIS — Y998 Other external cause status: Secondary | ICD-10-CM | POA: Insufficient documentation

## 2017-11-23 DIAGNOSIS — Z951 Presence of aortocoronary bypass graft: Secondary | ICD-10-CM | POA: Diagnosis not present

## 2017-11-23 DIAGNOSIS — N189 Chronic kidney disease, unspecified: Secondary | ICD-10-CM | POA: Diagnosis not present

## 2017-11-23 DIAGNOSIS — I2510 Atherosclerotic heart disease of native coronary artery without angina pectoris: Secondary | ICD-10-CM | POA: Diagnosis not present

## 2017-11-23 DIAGNOSIS — W11XXXA Fall on and from ladder, initial encounter: Secondary | ICD-10-CM | POA: Insufficient documentation

## 2017-11-23 DIAGNOSIS — Z7982 Long term (current) use of aspirin: Secondary | ICD-10-CM | POA: Diagnosis not present

## 2017-11-23 DIAGNOSIS — I129 Hypertensive chronic kidney disease with stage 1 through stage 4 chronic kidney disease, or unspecified chronic kidney disease: Secondary | ICD-10-CM | POA: Diagnosis not present

## 2017-11-23 DIAGNOSIS — S161XXA Strain of muscle, fascia and tendon at neck level, initial encounter: Secondary | ICD-10-CM | POA: Diagnosis not present

## 2017-11-23 DIAGNOSIS — S300XXA Contusion of lower back and pelvis, initial encounter: Secondary | ICD-10-CM | POA: Insufficient documentation

## 2017-11-23 DIAGNOSIS — Z79899 Other long term (current) drug therapy: Secondary | ICD-10-CM | POA: Insufficient documentation

## 2017-11-23 DIAGNOSIS — Y9389 Activity, other specified: Secondary | ICD-10-CM | POA: Insufficient documentation

## 2017-11-23 DIAGNOSIS — F419 Anxiety disorder, unspecified: Secondary | ICD-10-CM | POA: Insufficient documentation

## 2017-11-23 DIAGNOSIS — Y929 Unspecified place or not applicable: Secondary | ICD-10-CM | POA: Insufficient documentation

## 2017-11-23 DIAGNOSIS — S199XXA Unspecified injury of neck, initial encounter: Secondary | ICD-10-CM | POA: Diagnosis present

## 2017-11-23 MED ORDER — OXYCODONE-ACETAMINOPHEN 5-325 MG PO TABS: 1.0000 | ORAL_TABLET | Freq: Four times a day (QID) | ORAL | 0 refills | Status: AC | PRN

## 2017-11-23 MED ORDER — KETOROLAC TROMETHAMINE 60 MG/2ML IM SOLN
15.0000 mg | Freq: Once | INTRAMUSCULAR | Status: AC
  Administered 2017-11-23: 15 mg via INTRAMUSCULAR
  Filled 2017-11-23: qty 2

## 2017-11-23 MED ORDER — OXYCODONE-ACETAMINOPHEN 5-325 MG PO TABS
1.0000 | ORAL_TABLET | Freq: Once | ORAL | Status: AC
  Administered 2017-11-23: 1 via ORAL
  Filled 2017-11-23: qty 1

## 2017-11-23 NOTE — ED Triage Notes (Signed)
Billy SeatFell about 9 feet on Saturday.   Hit his head with no loc.  Was okay, but yesterday started feeling stiff in neck.  Patient in nad.

## 2017-11-23 NOTE — ED Triage Notes (Signed)
Patient presents to ED via POV post fall on Saturday afternoon. Patient was getting off a latter when he fell 8 feet. His left leg got caught in between the steps. Hemostatic wound noted to left shin. Patient reports the latter landed on him. Patient denies LOC, reports taking a daily asa. Patient reports he was able to get up by himself.

## 2017-11-23 NOTE — ED Notes (Signed)
Patient transported to X-ray 

## 2017-11-23 NOTE — ED Provider Notes (Signed)
[facility] Emergency Department Provider Note  ____________________________________________  Time seen: Approximately 6:39 PM  I have reviewed the triage vital signs and the nursing notes.   HISTORY  Chief Complaint Fall    HPI Billy Cisneros. is a 67 y.o. male with a history of CAD depression hypertension and anxiety who comes to the ED today after a fall.  He complains of pain diffusely in the neck which radiates up to the back of the head, severe, worse with movement.  No alleviating factors.  Constant.  No associated vision changes extremity weakness or paresthesia.  No vomiting or fever.  No chest pain or shortness of breath.  Only other complaint is gluteal tenderness, worse with sitting after the fall.  He was on a ladder when his foot missed the rung, causing him to fall down.  He grabbed the ladder and actually pulled a letter down on top of them.  On his way to the ground, he reports that the back of his head hit a metal deck chair.  He lost consciousness briefly.   Does not take blood thinners.  He has been ambulatory since the fall.   Past Medical History:  Diagnosis Date  . Anxiety   . Coronary artery disease   . Depression   . GERD (gastroesophageal reflux disease)   . Gout   . Hypertension      Patient Active Problem List   Diagnosis Date Noted  . Esophageal dysphagia   . Esophageal obstruction due to food impaction   . CKD (chronic kidney disease), stage III (HCC) 09/05/2017  . Acute gout 02/19/2016  . GERD (gastroesophageal reflux disease) 01/21/2016  . Pain and swelling of right wrist 08/13/2015  . Productive cough 08/13/2015  . Chronic right shoulder pain 06/12/2015  . Cannot sleep 06/12/2015  . Dyslipidemia 03/12/2015  . Cervical radiculopathy 01/08/2015  . Annual physical exam 01/01/2015  . H/O coronary artery bypass surgery 12/11/2014  . Hypertension 12/04/2014  . Anxiety 12/04/2014  . Chest pain 12/04/2014  .  Major depression 11/21/2011     Past Surgical History:  Procedure Laterality Date  . CARDIAC CATHETERIZATION    . CARDIAC SURGERY    . COLONOSCOPY WITH PROPOFOL N/A 05/25/2017   Procedure: COLONOSCOPY WITH PROPOFOL;  Surgeon: Wyline Mood, MD;  Location: [facility] ENDOSCOPY;  Service: Gastroenterology;  Laterality: N/A;  . CORONARY ARTERY BYPASS GRAFT    . ESOPHAGOGASTRODUODENOSCOPY N/A 10/27/2017   Procedure: ESOPHAGOGASTRODUODENOSCOPY (EGD);  Surgeon: Toney Reil, MD;  Location: [facility] ENDOSCOPY;  Service: Gastroenterology;  Laterality: N/A;  . ESOPHAGOGASTRODUODENOSCOPY (EGD) WITH PROPOFOL N/A 11/19/2017   Procedure: ESOPHAGOGASTRODUODENOSCOPY (EGD) WITH PROPOFOL;  Surgeon: Toney Reil, MD;  Location: [facility] ENDOSCOPY;  Service: Gastroenterology;  Laterality: N/A;     Prior to Admission medications   Medication Sig Start Date End Date Taking? Authorizing Provider  ALPRAZolam (XANAX) 0.5 MG tablet TAKE 1 TABLET BY MOUTH 3 TIMES A DAY AS NEEDED FOR ANXIETY. 06/24/17   Ellyn Hack, MD  aspirin EC 81 MG tablet Take 1 tablet (81 mg total) by mouth daily. For heart attack prevention. 11/22/11   Tamala Julian, PA-C  buPROPion (WELLBUTRIN XL) 300 MG 24 hr tablet Take 1 tablet (300 mg total) by mouth daily. 06/24/17   Ellyn Hack, MD  lisinopril (PRINIVIL,ZESTRIL) 10 MG tablet Take 1 tablet (10 mg total) by mouth daily. 06/24/17   Ellyn Hack, MD  omeprazole (PRILOSEC) 20 MG capsule Take 1  capsule (20 mg total) by mouth 2 (two) times daily before a meal. 06/24/17   Ellyn HackShah, Syed Asad A, MD  oxyCODONE-acetaminophen (PERCOCET) 5-325 MG tablet Take 1 tablet by mouth every 6 (six) hours as needed for severe pain. 11/23/17 11/23/18  Sharman CheekStafford, Maikol Grassia, MD  simvastatin (ZOCOR) 40 MG tablet Take 1 tablet (40 mg total) by mouth at bedtime. 06/24/17   Ellyn HackShah, Syed Asad A, MD  valACYclovir (VALTREX) 1000 MG tablet Take 1,000 mg by mouth 2 (two) times daily as needed (outbreak).    [provider]  zolpidem (AMBIEN) 10 MG tablet Take 1 tablet (10 mg total) by mouth at bedtime. 06/24/17   Ellyn HackShah, Syed Asad A, MD     Allergies Patient has no known allergies.   Family History  Problem Relation Age of Onset  . Heart attack Mother   . Hyperlipidemia Mother   . Hypertension Mother   . Cancer Father        lung  . Diabetes Sister   . Diabetes Brother   . Asthma Daughter   . Cancer Maternal Grandmother   . Stroke Maternal Grandfather   . Heart attack Paternal Grandfather     Social History Social History   Tobacco Use  . Smoking status: Never Smoker  . Smokeless tobacco: Never Used  Substance Use Topics  . Alcohol use: Yes    Alcohol/week: 0.0 oz    Comment: 3 beer a month  . Drug use: Yes    Frequency: 2.0 times per week    Types: Cocaine    Comment: last use 15-20 years per patient    Review of Systems  Constitutional:   No fever or chills.  ENT:   No sore throat. No rhinorrhea. Cardiovascular:   No chest pain or syncope. Respiratory:   No dyspnea or cough. Gastrointestinal:   Negative for abdominal pain, vomiting and diarrhea.  Musculoskeletal: Right gluteal pain.  Neck pain. All other systems reviewed and are negative except as documented above in ROS and HPI.  ____________________________________________   PHYSICAL EXAM:  VITAL SIGNS: ED Triage Vitals  Enc Vitals Group     BP 11/23/17 1429 97/61     Pulse Rate 11/23/17 1429 82     Resp 11/23/17 1429 17     Temp 11/23/17 1429 98.6 F (37 C)     Temp Source 11/23/17 1429 Oral     SpO2 11/23/17 1429 100 %     Weight 11/23/17 1430 160 lb (72.6 kg)     Height 11/23/17 1430 5\' 5"  (1.651 m)     Head Circumference --      Peak Flow --      Pain Score 11/23/17 1429 10     Pain Loc --      Pain Edu? --      Excl. in GC? --     Vital signs reviewed, nursing assessments reviewed.   Constitutional:   Alert and oriented. Non-toxic appearance. Eyes:   Conjunctivae are normal. EOMI.  PERRL.  No nystagmus ENT      Head:   Normocephalic and atraumatic.      Nose:   No congestion/rhinnorhea.       Mouth/Throat:   MMM, no pharyngeal erythema. No peritonsillar mass.       Neck:   No meningismus. Full ROM.  Diffuse tenderness in the midline and in the muscular soft tissues.  Bilaterally. Hematological/Lymphatic/Immunilogical:   No cervical lymphadenopathy. Cardiovascular:   RRR. Symmetric bilateral radial and DP  pulses.  No murmurs. Cap refill less than 2 seconds. Respiratory:   Normal respiratory effort without tachypnea/retractions. Breath sounds are clear and equal bilaterally. No wheezes/rales/rhonchi. Gastrointestinal:   Soft and nontender. Non distended. There is no CVA tenderness.  No rebound, rigidity, or guarding. Musculoskeletal:   Normal range of motion in all extremities. No joint effusions.  No lower extremity tenderness.  No edema.  Neck tenderness as above.  No other midline spinal tenderness. Neurologic:   Normal speech and language.  Motor grossly intact. No acute focal neurologic deficits are appreciated.  Skin:    Skin is warm, dry and intact. No rash noted.  No petechiae, purpura, or bullae.  There is ecchymosis on the right gluteus in the area of the ischial spine.  No point tenderness, no swelling or apparent hematoma formation.  No laceration  ____________________________________________    LABS (pertinent positives/negatives) (all labs ordered are listed, but only abnormal results are displayed) Labs Reviewed - No data to display ____________________________________________   EKG    ____________________________________________    RADIOLOGY  Dg Chest 1 View  Result Date: 11/23/2017 CLINICAL DATA:  Status post fall EXAM: CHEST  1 VIEW COMPARISON:  None. FINDINGS: There is no focal parenchymal opacity. There is no pleural effusion or pneumothorax. The heart and mediastinal contours are unremarkable. There is evidence of prior CABG. The osseous  structures are unremarkable. IMPRESSION: No active disease. Electronically Signed   By: Elige Ko   On: 11/23/2017 15:13   Ct Head Wo Contrast  Result Date: 11/23/2017 CLINICAL DATA:  67 year old male status post fall 2 days ago. Occipital laceration. Loss of consciousness. Severe neck pain. EXAM: CT HEAD WITHOUT CONTRAST CT CERVICAL SPINE WITHOUT CONTRAST TECHNIQUE: Multidetector CT imaging of the head and cervical spine was performed following the standard protocol without intravenous contrast. Multiplanar CT image reconstructions of the cervical spine were also generated. COMPARISON:  Cervical radiographs 01/01/2015 and earlier. FINDINGS: CT HEAD FINDINGS Brain: Cerebral volume is within normal limits for age. Patchy cerebral white matter hypodensity, most pronounced along the right frontal horn and anterior limb of the right internal capsule. Otherwise normal gray-white matter differentiation throughout the brain. No midline shift, ventriculomegaly, mass effect, evidence of mass lesion, intracranial hemorrhage or evidence of cortically based acute infarction. Vascular: Calcified atherosclerosis at the skull base. No suspicious intracranial vascular hyperdensity. Skull: Intact, negative. Sinuses/Orbits: Mild to moderate posterior right ethmoid and right sphenoid mucosal thickening with some bubbly opacity. Other visible paranasal sinuses and mastoids are well pneumatized. Other: No posterior scalp subcutaneous gas. Mild broad-based posterior vertex contusion or hematoma on series 3, image 66. Underlying calvarium intact. Visualized orbit soft tissues are within normal limits. CT CERVICAL SPINE FINDINGS Alignment: Preserved cervical lordosis. Cervicothoracic junction alignment is within normal limits. Bilateral posterior element alignment is within normal limits. Skull base and vertebrae: Visualized skull base is intact. No atlanto-occipital dissociation. No cervical spine fracture identified. Soft tissues  and spinal canal: No prevertebral fluid or swelling. No visible canal hematoma. Negative noncontrast neck soft tissues. Disc levels: Multilevel moderate and severe cervical facet degeneration, more pronounced on the left. Intermittent cervical disc space loss and endplate spurring. Up to mild associated cervical spinal stenosis is suspected, including at C6-C7. Upper chest: The visible upper thoracic levels appear intact. Negative lung apices. Negative noncontrast thoracic inlet. IMPRESSION: 1. Mild posterior scalp soft tissue injury without underlying skull fracture. 2. No other acute traumatic injury identified in the head or cervical spine. 3. Nonspecific cerebral white matter  changes, most commonly due to chronic small vessel disease. 4. Multilevel cervical spine degeneration, especially facet arthropathy. Mild associated spinal stenosis suspected. Electronically Signed   By: Odessa Fleming M.D.   On: 11/23/2017 15:07   Ct Cervical Spine Wo Contrast  Result Date: 11/23/2017 CLINICAL DATA:  67 year old male status post fall 2 days ago. Occipital laceration. Loss of consciousness. Severe neck pain. EXAM: CT HEAD WITHOUT CONTRAST CT CERVICAL SPINE WITHOUT CONTRAST TECHNIQUE: Multidetector CT imaging of the head and cervical spine was performed following the standard protocol without intravenous contrast. Multiplanar CT image reconstructions of the cervical spine were also generated. COMPARISON:  Cervical radiographs 01/01/2015 and earlier. FINDINGS: CT HEAD FINDINGS Brain: Cerebral volume is within normal limits for age. Patchy cerebral white matter hypodensity, most pronounced along the right frontal horn and anterior limb of the right internal capsule. Otherwise normal gray-white matter differentiation throughout the brain. No midline shift, ventriculomegaly, mass effect, evidence of mass lesion, intracranial hemorrhage or evidence of cortically based acute infarction. Vascular: Calcified atherosclerosis at the  skull base. No suspicious intracranial vascular hyperdensity. Skull: Intact, negative. Sinuses/Orbits: Mild to moderate posterior right ethmoid and right sphenoid mucosal thickening with some bubbly opacity. Other visible paranasal sinuses and mastoids are well pneumatized. Other: No posterior scalp subcutaneous gas. Mild broad-based posterior vertex contusion or hematoma on series 3, image 66. Underlying calvarium intact. Visualized orbit soft tissues are within normal limits. CT CERVICAL SPINE FINDINGS Alignment: Preserved cervical lordosis. Cervicothoracic junction alignment is within normal limits. Bilateral posterior element alignment is within normal limits. Skull base and vertebrae: Visualized skull base is intact. No atlanto-occipital dissociation. No cervical spine fracture identified. Soft tissues and spinal canal: No prevertebral fluid or swelling. No visible canal hematoma. Negative noncontrast neck soft tissues. Disc levels: Multilevel moderate and severe cervical facet degeneration, more pronounced on the left. Intermittent cervical disc space loss and endplate spurring. Up to mild associated cervical spinal stenosis is suspected, including at C6-C7. Upper chest: The visible upper thoracic levels appear intact. Negative lung apices. Negative noncontrast thoracic inlet. IMPRESSION: 1. Mild posterior scalp soft tissue injury without underlying skull fracture. 2. No other acute traumatic injury identified in the head or cervical spine. 3. Nonspecific cerebral white matter changes, most commonly due to chronic small vessel disease. 4. Multilevel cervical spine degeneration, especially facet arthropathy. Mild associated spinal stenosis suspected. Electronically Signed   By: Odessa Fleming M.D.   On: 11/23/2017 15:07   Dg Cervical Spine Flex & Extend Only  Result Date: 11/23/2017 CLINICAL DATA:  Pain after fall. Patient hit back of head and sustained neck pain. Flexion and extension views requested. EXAM:  CERVICAL SPINE - FLEXION AND EXTENSION VIEWS ONLY COMPARISON:  None. FINDINGS: Neutral, flexion and extension lateral views of the cervical spine are provided. Maintained cervical lordosis between flexion, neutral and extension without listhesis. No fractures identified. Disc spaces are maintained. The atlantodental interval and craniocervical relationship appear intact. The overlying airway is unremarkable. The epiglottis is within normal limits. Bony posterior occipital protuberance is seen of the skull base. IMPRESSION: No fracture or listhesis between flexion and extension noted of the cervical spine. Electronically Signed   By: Tollie Eth M.D.   On: 11/23/2017 17:41    ____________________________________________   PROCEDURES Procedures  ____________________________________________  DIFFERENTIAL DIAGNOSIS   Subdural hematoma, C-spine fracture, C-spine ligamentous injury, contusion, cervical sprain and strain  CLINICAL IMPRESSION / ASSESSMENT AND PLAN / ED COURSE  Pertinent labs & imaging results that were available during my care  of the patient were reviewed by me and considered in my medical decision making (see chart for details).    Patient presents after a fall.  Mechanism is concerning for C-spine or intracranial injury.  CT head is unremarkable without evidence of intracranial hemorrhage.  CT cervical spine is negative for fracture, so I proceeded with a flex ex view x-ray series which was negative for any signs of instability.  Chest x-ray was also unremarkable.  Vital signs are normal, patient is in good condition for discharge home and follow-up with primary care.  Counseled on contusions and muscle strain, likely to continue being sore for several days.  Provided a short prescription for Percocet after reviewing the controlled substance reporting system.      ____________________________________________   FINAL CLINICAL IMPRESSION(S) / ED DIAGNOSES    Final diagnoses:   Cervical strain, acute, initial encounter  Contusion of pelvic region, initial encounter     ED Discharge Orders        Ordered    oxyCODONE-acetaminophen (PERCOCET) 5-325 MG tablet  Every 6 hours PRN     11/23/17 1837      Portions of this note were generated with dragon dictation software. Dictation errors may occur despite best attempts at proofreading.    Sharman Cheek, MD 11/23/17 [phone]

## 2017-11-23 NOTE — Discharge Instructions (Addendum)
Your tests today were okay and do not show any serious traumatic injuries. Please follow up with your doctor for continued monitoring of your symptoms.

## 2017-11-23 NOTE — ED Notes (Signed)
Pt discharged home after verbalizing understanding of discharge instructions; nad noted. 

## 2017-11-23 NOTE — ED Notes (Signed)
Pt presents post fall (fell on Saturday). He fell about 9 feet onto a deck after cleaning gutters. He states that he has pain in his shoulders and neck, and that he hit his head on a metal chair. Reports that he passed out for a few seconds. He has abrasion to left shin, bruise on right buttock, and pain in his neck and shoulders. NAD noted.

## 2017-11-23 NOTE — ED Notes (Signed)
Dr. Roxan Hockeyobinson notified of patient presentation and BP. See orders.

## 2017-11-25 ENCOUNTER — Ambulatory Visit
Admission: RE | Admit: 2017-11-25 | Discharge: 2017-11-25 | Disposition: A | Discharge status: Home / Self Care | Payer: Medicare Other | Source: Ambulatory Visit | Attending: Physical Medicine and Rehabilitation | Admitting: Physical Medicine and Rehabilitation

## 2017-11-25 DIAGNOSIS — M4722 Other spondylosis with radiculopathy, cervical region: Secondary | ICD-10-CM | POA: Insufficient documentation

## 2017-11-25 DIAGNOSIS — M5412 Radiculopathy, cervical region: Secondary | ICD-10-CM | POA: Diagnosis present

## 2017-11-27 ENCOUNTER — Ambulatory Visit: Payer: Medicare Other | Admitting: Physician Assistant

## 2018-01-07 ENCOUNTER — Encounter: Payer: Self-pay | Admitting: Internal Medicine

## 2018-01-07 ENCOUNTER — Ambulatory Visit: Payer: Medicare Other | Admitting: Internal Medicine

## 2018-01-07 VITALS — BP 120/70 | HR 78 | Ht 65.0 in | Wt 166.0 lb

## 2018-01-07 DIAGNOSIS — E782 Mixed hyperlipidemia: Secondary | ICD-10-CM | POA: Diagnosis not present

## 2018-01-07 DIAGNOSIS — Z951 Presence of aortocoronary bypass graft: Secondary | ICD-10-CM | POA: Diagnosis not present

## 2018-01-07 DIAGNOSIS — I10 Essential (primary) hypertension: Secondary | ICD-10-CM

## 2018-01-07 NOTE — Patient Instructions (Signed)
Your physician wants you to follow-up in: ONE YEAR with Dr. Rennis GoldenHilty. You will receive a reminder letter in the mail two months in advance. If you don't receive a letter, please call our office to schedule the follow-up appointment.   Dr. Donalee CitrinGary Cram

## 2018-01-07 NOTE — Progress Notes (Signed)
OFFICE NOTE  Chief Complaint:  Hospital follow-up  Primary Care Physician: Lynnea FerrierKlein, Bert J III, MD  HPI:  Billy ChanceKenneth H Mach Jr. is a 67 y.o. male with a past medial history significant for coronary artery disease, formerly followed by Dr. Darrold JunkerParaschos in [place], with a history of 5 vessel CABG in 2000 (LIMA to LAD, RIMA to RCA, SVG to OM1, diagonal and ramus), patent grafts by catheterization 2011, who presented in April 2019 with chest pain.  He was last seen in the office in 2016.  He had a stress test in 2014 which showed no reversible ischemia at [facility].  He also has a history of hypertension, anxiety, gout and GERD.  He presented with left-sided chest pain that was exertional over the past 3 to 4 days.  He has been working mowing lawns.  He noted pain that radiated to his neck and down his left arm.  Blood pressure was initially elevated in the emergency department.  Creatinine is elevated 1.69.  Troponin was negative overnight x4.  Lipid profile showed total cholesterol 173, HDL 39, LDL 94 and triglycerides 201.  Chest x-ray showed no acute cardiopulmonary disease.  EKG shows normal sinus rhythm without ischemia, personally reviewed.  He underwent nuclear stress testing which showed no reversible ischemia and LVEF of 68%.  This was considered a low risk study.  My concern was for cervical radiculopathy causing his symptoms.  Subsequently he unfortunately had a fall off of a ladder was seen in the [place] ER.  They did do cervical C-spine films as well as an MRI which showed multilevel disc disease some disc protrusion and spondylolysis.  These were considered pre-existing findings however could likely explain his symptoms.  Subsequently was referred to a physiatrist and has been undergoing injections of the neck, but has not noted much improvement.  He denies any further chest pain or worsening shortness of breath.  PMHx:  Past Medical History:  Diagnosis Date  .  Anxiety   . Coronary artery disease   . Depression   . GERD (gastroesophageal reflux disease)   . Gout   . Hypertension     Past Surgical History:  Procedure Laterality Date  . CARDIAC CATHETERIZATION    . CARDIAC SURGERY    . COLONOSCOPY WITH PROPOFOL N/A 05/25/2017   Procedure: COLONOSCOPY WITH PROPOFOL;  Surgeon: Wyline MoodAnna, Kiran, MD;  Location: [facility] ENDOSCOPY;  Service: Gastroenterology;  Laterality: N/A;  . CORONARY ARTERY BYPASS GRAFT    . ESOPHAGOGASTRODUODENOSCOPY N/A 10/27/2017   Procedure: ESOPHAGOGASTRODUODENOSCOPY (EGD);  Surgeon: Toney ReilVanga, Rohini Reddy, MD;  Location[facility] ENDOSCOPY;  Service: Gastroenterology;  Laterality: N/A;  . ESOPHAGOGASTRODUODENOSCOPY (EGD) WITH PROPOFOL N/A 11/19/2017   Procedure: ESOPHAGOGASTRODUODENOSCOPY (EGD) WITH PROPOFOL;  Surgeon: Toney ReilVanga, Rohini Reddy, MD;  Location: [facility] ENDOSCOPY;  Service: Gastroenterology;  Laterality: N/A;    FAMHx:  Family History  Problem Relation Age of Onset  . Heart attack Mother   . Hyperlipidemia Mother   . Hypertension Mother   . Cancer Father        lung  . Diabetes Sister   . Diabetes Brother   . Asthma Daughter   . Cancer Maternal Grandmother   . Stroke Maternal Grandfather   . Heart attack Paternal Grandfather     SOCHx:   reports that he has never smoked. He has never used smokeless tobacco. He reports that he drinks alcohol. He reports that he has current or past drug history. Drug: Cocaine. Frequency: 2.00 times per week.  ALLERGIES:  No Known Allergies  ROS: Pertinent items noted in HPI and remainder of comprehensive ROS otherwise negative.  HOME MEDS: Current Outpatient Medications on File Prior to Visit  Medication Sig Dispense Refill  . ALPRAZolam (XANAX) 0.5 MG tablet TAKE 1 TABLET BY MOUTH 3 TIMES A DAY AS NEEDED FOR ANXIETY. 90 tablet 2  . aspirin EC 81 MG tablet Take 1 tablet (81 mg total) by mouth daily. For heart attack prevention.    Marland Kitchen buPROPion (WELLBUTRIN XL) 300 MG 24 hr tablet Take  1 tablet (300 mg total) by mouth daily. 90 tablet 0  . HYDROcodone-acetaminophen (NORCO/VICODIN) 5-325 MG tablet Take 5 tablets by mouth as needed.    Marland Kitchen lisinopril (PRINIVIL,ZESTRIL) 10 MG tablet Take 1 tablet (10 mg total) by mouth daily. 90 tablet 0  . omeprazole (PRILOSEC) 20 MG capsule Take 1 capsule (20 mg total) by mouth 2 (two) times daily before a meal. 180 capsule 0  . simvastatin (ZOCOR) 40 MG tablet Take 1 tablet (40 mg total) by mouth at bedtime. 90 tablet 0  . valACYclovir (VALTREX) 1000 MG tablet Take 1,000 mg by mouth 2 (two) times daily as needed (outbreak).    Marland Kitchen zolpidem (AMBIEN) 10 MG tablet Take 1 tablet (10 mg total) by mouth at bedtime. 30 tablet 2  . oxyCODONE-acetaminophen (PERCOCET) 5-325 MG tablet Take 1 tablet by mouth every 6 (six) hours as needed for severe pain. (Patient not taking: Reported on 01/07/2018) 10 tablet 0   No current facility-administered medications on file prior to visit.     LABS/IMAGING: No results found for this or any previous visit (from the past 48 hour(s)). No results found.  LIPID PANEL:    Component Value Date/Time   CHOL 173 09/04/2017 2338   CHOL 146 01/08/2015 0925   CHOL 141 08/06/2011 0327   TRIG 201 (H) 09/04/2017 2338   TRIG 375 (H) 08/06/2011 0327   HDL 39 (L) 09/04/2017 2338   HDL 42 01/08/2015 0925   HDL 25 (L) 08/06/2011 0327   CHOLHDL 4.4 09/04/2017 2338   VLDL 40 09/04/2017 2338   VLDL 75 (H) 08/06/2011 0327   LDLCALC 94 09/04/2017 2338   LDLCALC 76 01/08/2015 0925   LDLCALC 41 08/06/2011 0327     WEIGHTS: Wt Readings from Last 3 Encounters:  01/07/18 166 lb (75.3 kg)  11/23/17 160 lb (72.6 kg)  11/19/17 160 lb (72.6 kg)    VITALS: BP 120/70 (BP Location: Right Arm, Patient Position: Sitting, Cuff Size: Normal)   Pulse 78   Ht 5\' 5"  (1.651 m)   Wt 166 lb (75.3 kg)   BMI 27.62 kg/m   EXAM: General appearance: alert and no distress Neck: no carotid bruit, no JVD and thyroid not enlarged, symmetric, no  tenderness/mass/nodules Lungs: clear to auscultation bilaterally Heart: regular rate and rhythm, S1, S2 normal, no murmur, click, rub or gallop Abdomen: soft, non-tender; bowel sounds normal; no masses,  no organomegaly Extremities: extremities normal, atraumatic, no cyanosis or edema Pulses: 2+ and symmetric Skin: Skin color, texture, turgor normal. No rashes or lesions Neurologic: Grossly normal Psych: Pleasant  EKG: Normal sinus rhythm 78-personally reviewed- personally reviewed  ASSESSMENT: 1. Coronary artery disease status post 5 vessel CABG in 2000 (LIMA to LAD, RIMA to RCA, SVG to OM1, diagonal and ramus) 2. Low risk Myoview stress test in 08/2017 with LVEF 68% 3. Cervical radiculopathy with anterior chest and left arm involvement 4. Hypertension 5. Dyslipidemia  PLAN: 1.   Mr. Jaclynn Major has  multivessel coronary disease status post CABG in 2000 but had a low risk stress test this year for somewhat atypical chest pain ruled out for MI.  LVEF was normal.  He is experiencing cervical radiculopathy related to likely multilevel cervical spine disease is seen by MRI.  He has been undergoing injections without much benefit.  He may need neurosurgery evaluation.  I would recommend Dr. Donalee Citrin for further evaluation if necessary.  His blood pressure is very well controlled today.  Cholesterol is been at goal as well and he has an upcoming test in about a month.  I will review those results and hopefully he is at goal LDL less than 70.  Follow-up with me annually or sooner as necessary.  Chrystie Nose, MD, [facility], FACP  [place]  [facility]  Medical Director of the Advanced Lipid Disorders &  Cardiovascular Risk Reduction Clinic Diplomate of the American Board of Clinical Lipidology Attending Cardiologist  Direct Dial: [phone]  Fax: [phone]  Website:  [email or web address] 01/07/2018, 9:35 AM

## 2019-05-18 ENCOUNTER — Other Ambulatory Visit: Admission: RE | Admit: 2019-05-18 | Payer: Medicare Other | Source: Ambulatory Visit

## 2019-05-20 ENCOUNTER — Other Ambulatory Visit: Admission: RE | Admit: 2019-05-20 | Payer: Medicare Other | Source: Ambulatory Visit

## 2019-09-23 ENCOUNTER — Other Ambulatory Visit: Payer: Self-pay

## 2019-09-23 ENCOUNTER — Ambulatory Visit: Payer: Medicare Other | Admitting: Internal Medicine

## 2019-09-23 ENCOUNTER — Encounter: Payer: Self-pay | Admitting: Internal Medicine

## 2019-09-23 VITALS — BP 125/70 | HR 80 | Temp 96.3°F | Ht 64.0 in | Wt 177.8 lb

## 2019-09-23 DIAGNOSIS — Z951 Presence of aortocoronary bypass graft: Secondary | ICD-10-CM

## 2019-09-23 DIAGNOSIS — E782 Mixed hyperlipidemia: Secondary | ICD-10-CM

## 2019-09-23 DIAGNOSIS — I200 Unstable angina: Secondary | ICD-10-CM

## 2019-09-23 MED ORDER — ICOSAPENT ETHYL 1 G PO CAPS: 2.0000 g | ORAL_CAPSULE | Freq: Two times a day (BID) | ORAL | 11 refills | Status: DC

## 2019-09-23 NOTE — Progress Notes (Signed)
 OFFICE NOTE  Chief Complaint:  Routine follow-up  Primary Care Physician: Klein, Bert J III, MD  HPI:  Billy H Key Jr. is a 68 y.o. male with a past medial history significant for coronary artery disease, formerly followed by Dr. Paraschos in [place], with a history of 5 vessel CABG in 2000 (LIMA to LAD, RIMA to RCA, SVG to OM1, diagonal and ramus), patent grafts by catheterization 2011, who presented in April 2019 with chest pain.  He was last seen in the office in 2016.  He had a stress test in 2014 which showed no reversible ischemia at [facility].  He also has a history of hypertension, anxiety, gout and GERD.  He presented with left-sided chest pain that was exertional over the past 3 to 4 days.  He has been working mowing lawns.  He noted pain that radiated to his neck and down his left arm.  Blood pressure was initially elevated in the emergency department.  Creatinine is elevated 1.69.  Troponin was negative overnight x4.  Lipid profile showed total cholesterol 173, HDL 39, LDL 94 and triglycerides 201.  Chest x-ray showed no acute cardiopulmonary disease.  EKG shows normal sinus rhythm without ischemia, personally reviewed.  He underwent nuclear stress testing which showed no reversible ischemia and LVEF of 68%.  This was considered a low risk study.  My concern was for cervical radiculopathy causing his symptoms.  Subsequently he unfortunately had a fall off of a ladder was seen in the [place] ER.  They did do cervical C-spine films as well as an MRI which showed multilevel disc disease some disc protrusion and spondylolysis.  These were considered pre-existing findings however could likely explain his symptoms.  Subsequently was referred to a physiatrist and has been undergoing injections of the neck, but has not noted much improvement.  He denies any further chest pain or worsening shortness of breath.  09/23/2019  Billy Cisneros returns for follow-up.  Its  been sometime since he was here.  More recently though he has been having some difficulty with discomfort in his chest.  He says this is exertional and improves at rest at times has been complicated by some left arm heaviness.  Symptoms are obviously a little concerning for coronary disease.  He is more than 20 years out from his bypass grafting and had had patent grafts in 2011 and a stress test in 2019 with no ischemia.  Recent labs were reasonably well-controlled with an LDL 62 however his triglycerides remain very elevated at 320.  This is a possible persistent cardiovascular risk factor and I suspect will need treatment.  PMHx:  Past Medical History:  Diagnosis Date  . Anxiety   . Coronary artery disease   . Depression   . GERD (gastroesophageal reflux disease)   . Gout   . Hypertension     Past Surgical History:  Procedure Laterality Date  . CARDIAC CATHETERIZATION    . CARDIAC SURGERY    . COLONOSCOPY WITH PROPOFOL N/A 05/25/2017   Procedure: COLONOSCOPY WITH PROPOFOL;  Surgeon: Anna, Kiran, MD;  Location: [facility] ENDOSCOPY;  Service: Gastroenterology;  Laterality: N/A;  . CORONARY ARTERY BYPASS GRAFT    . ESOPHAGOGASTRODUODENOSCOPY N/A 10/27/2017   Procedure: ESOPHAGOGASTRODUODENOSCOPY (EGD);  Surgeon: Vanga, Rohini Reddy, MD;  Location: [facility] ENDOSCOPY;  Service: Gastroenterology;  Laterality: N/A;  . ESOPHAGOGASTRODUODENOSCOPY (EGD) WITH PROPOFOL N/A 11/19/2017   Procedure: ESOPHAGOGASTRODUODENOSCOPY (EGD) WITH PROPOFOL;  Surgeon: Vanga, Rohini Reddy, MD;  Location: [facility] ENDOSCOPY;    Service: Gastroenterology;  Laterality: N/A;    FAMHx:  Family History  Problem Relation Age of Onset  . Heart attack Mother   . Hyperlipidemia Mother   . Hypertension Mother   . Cancer Father        lung  . Diabetes Sister   . Diabetes Brother   . Asthma Daughter   . Cancer Maternal Grandmother   . Stroke Maternal Grandfather   . Heart attack Paternal Grandfather     SOCHx:   reports that he  has never smoked. He has never used smokeless tobacco. He reports current alcohol use. He reports current drug use. Frequency: 2.00 times per week. Drug: Cocaine.  ALLERGIES:  No Known Allergies  ROS: Pertinent items noted in HPI and remainder of comprehensive ROS otherwise negative.  HOME MEDS: Current Outpatient Medications on File Prior to Visit  Medication Sig Dispense Refill  . ALPRAZolam (XANAX) 0.5 MG tablet TAKE 1 TABLET BY MOUTH 3 TIMES A DAY AS NEEDED FOR ANXIETY. 90 tablet 2  . aspirin EC 81 MG tablet Take 1 tablet (81 mg total) by mouth daily. For heart attack prevention.    Marland Kitchen buPROPion (WELLBUTRIN XL) 300 MG 24 hr tablet Take 1 tablet (300 mg total) by mouth daily. 90 tablet 0  . HYDROcodone-acetaminophen (NORCO/VICODIN) 5-325 MG tablet Take 5 tablets by mouth as needed.    Marland Kitchen lisinopril (PRINIVIL,ZESTRIL) 10 MG tablet Take 1 tablet (10 mg total) by mouth daily. 90 tablet 0  . omeprazole (PRILOSEC) 20 MG capsule Take 1 capsule (20 mg total) by mouth 2 (two) times daily before a meal. 180 capsule 0  . simvastatin (ZOCOR) 40 MG tablet Take 1 tablet (40 mg total) by mouth at bedtime. 90 tablet 0  . valACYclovir (VALTREX) 1000 MG tablet Take 1,000 mg by mouth 2 (two) times daily as needed (outbreak).    Marland Kitchen zolpidem (AMBIEN) 10 MG tablet Take 1 tablet (10 mg total) by mouth at bedtime. 30 tablet 2   No current facility-administered medications on file prior to visit.    LABS/IMAGING: No results found for this or any previous visit (from the past 48 hour(s)). No results found.  LIPID PANEL:    Component Value Date/Time   CHOL 173 09/04/2017 2338   CHOL 146 01/08/2015 0925   CHOL 141 08/06/2011 0327   TRIG 201 (H) 09/04/2017 2338   TRIG 375 (H) 08/06/2011 0327   HDL 39 (L) 09/04/2017 2338   HDL 42 01/08/2015 0925   HDL 25 (L) 08/06/2011 0327   CHOLHDL 4.4 09/04/2017 2338   VLDL 40 09/04/2017 2338   VLDL 75 (H) 08/06/2011 0327   LDLCALC 94 09/04/2017 2338   LDLCALC 76  01/08/2015 0925   LDLCALC 41 08/06/2011 0327     WEIGHTS: Wt Readings from Last 3 Encounters:  09/23/19 177 lb 12.8 oz (80.6 kg)  01/07/18 166 lb (75.3 kg)  11/23/17 160 lb (72.6 kg)    VITALS: BP 125/70   Pulse 80   Temp (!) 96.3 F (35.7 C)   Ht 5\' 4"  (1.626 m)   Wt 177 lb 12.8 oz (80.6 kg)   SpO2 96%   BMI 30.52 kg/m   EXAM: General appearance: alert and no distress Neck: no carotid bruit, no JVD and thyroid not enlarged, symmetric, no tenderness/mass/nodules Lungs: clear to auscultation bilaterally Heart: regular rate and rhythm, S1, S2 normal, no murmur, click, rub or gallop Abdomen: soft, non-tender; bowel sounds normal; no masses,  no organomegaly Extremities: extremities normal,  atraumatic, no cyanosis or edema Pulses: 2+ and symmetric Skin: Skin color, texture, turgor normal. No rashes or lesions Neurologic: Grossly normal Psych: Pleasant  EKG:  Sinus rhythm 80-personally reviewed  ASSESSMENT: 1. Possible unstable angina 2. Coronary artery disease status post 5 vessel CABG in 2000 (LIMA to LAD, RIMA to RCA, SVG to OM1, diagonal and ramus) 3. Low risk Myoview stress test in 08/2017 with LVEF 68% 4. Cervical radiculopathy with anterior chest and left arm involvement 5. Hypertension 6. Dyslipidemia -elevated triglycerides  PLAN: 1.   Billy Cisneros is describing possible unstable angina with chest pain and left arm pain with exertion that improves at rest.  Given his history of coronary disease and prior grafts, I would recommend repeat stress testing.  His last Myoview was negative however that was 2 years ago.  In addition his triglycerides remain elevated.  I recommend starting Vascepa 2 g twice daily.  We will repeat lipids in about 3 months.  Plan follow-up in 6 months or sooner if necessary according to your stress test results.  Chrystie Nose, MD, [facility], FACP  [place]  [facility]  Medical Director of the Advanced Lipid Disorders &  Cardiovascular  Risk Reduction Clinic Diplomate of the American Board of Clinical Lipidology Attending Cardiologist  Direct Dial: [phone]  Fax: [phone]  Website:  [email or web address] 09/23/2019, 10:52 AM

## 2019-09-23 NOTE — Patient Instructions (Signed)
Medication Instructions:  START vascepa 2 capsules twice daily This medication may need a pre-approval with insurance If you need co-pay assistance, please apply via [email or web address] under "disease funds" and then "hypercholesterolemia"  *If you need a refill on your cardiac medications before your next appointment, please call your pharmacy*   Testing/Procedures: LEXISCAN MYOVIEW stress test to be completed @ [address]   Follow-Up: At [facility], you and your health needs are our priority.  As part of our continuing mission to provide you with exceptional heart care, we have created designated Provider Care Teams.  These Care Teams include your primary Cardiologist (physician) and Advanced Practice Providers (APPs -  Physician Assistants and Nurse Practitioners) who all work together to provide you with the care you need, when you need it.  We recommend signing up for the patient portal called "MyChart".  Sign up information is provided on this After Visit Summary.  MyChart is used to connect with patients for Virtual Visits (Telemedicine).  Patients are able to view lab/test results, encounter notes, upcoming appointments, etc.  Non-urgent messages can be sent to your provider as well.   To learn more about what you can do with MyChart, go to [email or web address].    Your next appointment:   3-4 week(s) - after stress test  The format for your next appointment:   In Person  Provider:   You may see Dr. Rennis Golden or one of the following Advanced Practice Providers on your designated Care Team:    Azalee Course, PA-C  Micah Flesher, [place] or   Judy Pimple, [place]    Other Instructions

## 2019-09-23 NOTE — H&P (View-Only) (Signed)
OFFICE NOTE  Chief Complaint:  Routine follow-up  Primary Care Physician: Lynnea Ferrier, MD  HPI:  Billy Cisneros. is a 69 y.o. male with a past medial history significant for coronary artery disease, formerly followed by Dr. Darrold Junker in [place], with a history of 5 vessel CABG in 2000 (LIMA to LAD, RIMA to RCA, SVG to OM1, diagonal and ramus), patent grafts by catheterization 2011, who presented in April 2019 with chest pain.  He was last seen in the office in 2016.  He had a stress test in 2014 which showed no reversible ischemia at [facility].  He also has a history of hypertension, anxiety, gout and GERD.  He presented with left-sided chest pain that was exertional over the past 3 to 4 days.  He has been working mowing lawns.  He noted pain that radiated to his neck and down his left arm.  Blood pressure was initially elevated in the emergency department.  Creatinine is elevated 1.69.  Troponin was negative overnight x4.  Lipid profile showed total cholesterol 173, HDL 39, LDL 94 and triglycerides 201.  Chest x-ray showed no acute cardiopulmonary disease.  EKG shows normal sinus rhythm without ischemia, personally reviewed.  He underwent nuclear stress testing which showed no reversible ischemia and LVEF of 68%.  This was considered a low risk study.  My concern was for cervical radiculopathy causing his symptoms.  Subsequently he unfortunately had a fall off of a ladder was seen in the [place] ER.  They did do cervical C-spine films as well as an MRI which showed multilevel disc disease some disc protrusion and spondylolysis.  These were considered pre-existing findings however could likely explain his symptoms.  Subsequently was referred to a physiatrist and has been undergoing injections of the neck, but has not noted much improvement.  He denies any further chest pain or worsening shortness of breath.  09/23/2019  Billy Cisneros returns for follow-up.  Its  been sometime since he was here.  More recently though he has been having some difficulty with discomfort in his chest.  He says this is exertional and improves at rest at times has been complicated by some left arm heaviness.  Symptoms are obviously a little concerning for coronary disease.  He is more than 20 years out from his bypass grafting and had had patent grafts in 2011 and a stress test in 2019 with no ischemia.  Recent labs were reasonably well-controlled with an LDL 62 however his triglycerides remain very elevated at 320.  This is a possible persistent cardiovascular risk factor and I suspect will need treatment.  PMHx:  Past Medical History:  Diagnosis Date  . Anxiety   . Coronary artery disease   . Depression   . GERD (gastroesophageal reflux disease)   . Gout   . Hypertension     Past Surgical History:  Procedure Laterality Date  . CARDIAC CATHETERIZATION    . CARDIAC SURGERY    . COLONOSCOPY WITH PROPOFOL N/A 05/25/2017   Procedure: COLONOSCOPY WITH PROPOFOL;  Surgeon: Wyline Mood, MD;  Location: [facility] ENDOSCOPY;  Service: Gastroenterology;  Laterality: N/A;  . CORONARY ARTERY BYPASS GRAFT    . ESOPHAGOGASTRODUODENOSCOPY N/A 10/27/2017   Procedure: ESOPHAGOGASTRODUODENOSCOPY (EGD);  Surgeon: Toney Reil, MD;  Location: [facility] ENDOSCOPY;  Service: Gastroenterology;  Laterality: N/A;  . ESOPHAGOGASTRODUODENOSCOPY (EGD) WITH PROPOFOL N/A 11/19/2017   Procedure: ESOPHAGOGASTRODUODENOSCOPY (EGD) WITH PROPOFOL;  Surgeon: Toney Reil, MD;  Location: [facility] ENDOSCOPY;  Service: Gastroenterology;  Laterality: N/A;    FAMHx:  Family History  Problem Relation Age of Onset  . Heart attack Mother   . Hyperlipidemia Mother   . Hypertension Mother   . Cancer Father        lung  . Diabetes Sister   . Diabetes Brother   . Asthma Daughter   . Cancer Maternal Grandmother   . Stroke Maternal Grandfather   . Heart attack Paternal Grandfather     SOCHx:   reports that he  has never smoked. He has never used smokeless tobacco. He reports current alcohol use. He reports current drug use. Frequency: 2.00 times per week. Drug: Cocaine.  ALLERGIES:  No Known Allergies  ROS: Pertinent items noted in HPI and remainder of comprehensive ROS otherwise negative.  HOME MEDS: Current Outpatient Medications on File Prior to Visit  Medication Sig Dispense Refill  . ALPRAZolam (XANAX) 0.5 MG tablet TAKE 1 TABLET BY MOUTH 3 TIMES A DAY AS NEEDED FOR ANXIETY. 90 tablet 2  . aspirin EC 81 MG tablet Take 1 tablet (81 mg total) by mouth daily. For heart attack prevention.    Marland Kitchen buPROPion (WELLBUTRIN XL) 300 MG 24 hr tablet Take 1 tablet (300 mg total) by mouth daily. 90 tablet 0  . HYDROcodone-acetaminophen (NORCO/VICODIN) 5-325 MG tablet Take 5 tablets by mouth as needed.    Marland Kitchen lisinopril (PRINIVIL,ZESTRIL) 10 MG tablet Take 1 tablet (10 mg total) by mouth daily. 90 tablet 0  . omeprazole (PRILOSEC) 20 MG capsule Take 1 capsule (20 mg total) by mouth 2 (two) times daily before a meal. 180 capsule 0  . simvastatin (ZOCOR) 40 MG tablet Take 1 tablet (40 mg total) by mouth at bedtime. 90 tablet 0  . valACYclovir (VALTREX) 1000 MG tablet Take 1,000 mg by mouth 2 (two) times daily as needed (outbreak).    Marland Kitchen zolpidem (AMBIEN) 10 MG tablet Take 1 tablet (10 mg total) by mouth at bedtime. 30 tablet 2   No current facility-administered medications on file prior to visit.    LABS/IMAGING: No results found for this or any previous visit (from the past 48 hour(s)). No results found.  LIPID PANEL:    Component Value Date/Time   CHOL 173 09/04/2017 2338   CHOL 146 01/08/2015 0925   CHOL 141 08/06/2011 0327   TRIG 201 (H) 09/04/2017 2338   TRIG 375 (H) 08/06/2011 0327   HDL 39 (L) 09/04/2017 2338   HDL 42 01/08/2015 0925   HDL 25 (L) 08/06/2011 0327   CHOLHDL 4.4 09/04/2017 2338   VLDL 40 09/04/2017 2338   VLDL 75 (H) 08/06/2011 0327   LDLCALC 94 09/04/2017 2338   LDLCALC 76  01/08/2015 0925   LDLCALC 41 08/06/2011 0327     WEIGHTS: Wt Readings from Last 3 Encounters:  09/23/19 177 lb 12.8 oz (80.6 kg)  01/07/18 166 lb (75.3 kg)  11/23/17 160 lb (72.6 kg)    VITALS: BP 125/70   Pulse 80   Temp (!) 96.3 F (35.7 C)   Ht 5\' 4"  (1.626 m)   Wt 177 lb 12.8 oz (80.6 kg)   SpO2 96%   BMI 30.52 kg/m   EXAM: General appearance: alert and no distress Neck: no carotid bruit, no JVD and thyroid not enlarged, symmetric, no tenderness/mass/nodules Lungs: clear to auscultation bilaterally Heart: regular rate and rhythm, S1, S2 normal, no murmur, click, rub or gallop Abdomen: soft, non-tender; bowel sounds normal; no masses,  no organomegaly Extremities: extremities normal,  atraumatic, no cyanosis or edema Pulses: 2+ and symmetric Skin: Skin color, texture, turgor normal. No rashes or lesions Neurologic: Grossly normal Psych: Pleasant  EKG:  Sinus rhythm 80-personally reviewed  ASSESSMENT: 1. Possible unstable angina 2. Coronary artery disease status post 5 vessel CABG in 2000 (LIMA to LAD, RIMA to RCA, SVG to OM1, diagonal and ramus) 3. Low risk Myoview stress test in 08/2017 with LVEF 68% 4. Cervical radiculopathy with anterior chest and left arm involvement 5. Hypertension 6. Dyslipidemia -elevated triglycerides  PLAN: 1.   Billy Cisneros is describing possible unstable angina with chest pain and left arm pain with exertion that improves at rest.  Given his history of coronary disease and prior grafts, I would recommend repeat stress testing.  His last Myoview was negative however that was 2 years ago.  In addition his triglycerides remain elevated.  I recommend starting Vascepa 2 g twice daily.  We will repeat lipids in about 3 months.  Plan follow-up in 6 months or sooner if necessary according to your stress test results.  Chrystie Nose, MD, [facility], FACP  [place]  [facility]  Medical Director of the [facility] Lipid Disorders &  Cardiovascular  Risk Reduction Clinic Diplomate of the American Board of Clinical Lipidology Attending Cardiologist  Direct Dial: [phone]  Fax: [phone]  Website:  [email or web address] Vorhees Lilly Gasser 09/23/2019, 10:52 AM

## 2019-09-27 ENCOUNTER — Telehealth: Payer: Self-pay | Admitting: Internal Medicine

## 2019-09-27 NOTE — Telephone Encounter (Signed)
MyChart message sent to patient with [employer] grant information

## 2019-09-27 NOTE — Telephone Encounter (Signed)
New message   Patient wants to know if there is a substiute for icosapent Ethyl (VASCEPA) 1 g capsule . Patient states that he can't afford this medication.

## 2019-09-28 ENCOUNTER — Telehealth (HOSPITAL_COMMUNITY): Payer: Self-pay | Admitting: *Deleted

## 2019-09-28 ENCOUNTER — Other Ambulatory Visit: Payer: Self-pay

## 2019-09-28 NOTE — Telephone Encounter (Signed)
Patient given detailed instructions per Myocardial Perfusion Study Information Sheet for the test on 10/03/19 at 7:45. Patient notified to arrive 15 minutes early and that it is imperative to arrive on time for appointment to keep from having the test rescheduled.  If you need to cancel or reschedule your appointment, please call the office within 24 hours of your appointment. . Patient verbalized understanding.Daneil Dolin

## 2019-09-29 NOTE — Telephone Encounter (Signed)
Patient communicated via MyChart message that he was able to use GoodRx coupon

## 2019-09-30 ENCOUNTER — Inpatient Hospital Stay (HOSPITAL_COMMUNITY): Admission: RE | Admit: 2019-09-30 | Payer: Medicare Other | Source: Ambulatory Visit

## 2019-09-30 ENCOUNTER — Encounter (HOSPITAL_COMMUNITY): Payer: Medicare Other

## 2019-10-03 ENCOUNTER — Other Ambulatory Visit: Payer: Self-pay

## 2019-10-03 ENCOUNTER — Ambulatory Visit (HOSPITAL_COMMUNITY): Payer: Medicare Other | Attending: Cardiovascular Disease

## 2019-10-03 VITALS — Ht 64.0 in | Wt 177.0 lb

## 2019-10-03 DIAGNOSIS — I200 Unstable angina: Secondary | ICD-10-CM | POA: Diagnosis not present

## 2019-10-03 DIAGNOSIS — Z951 Presence of aortocoronary bypass graft: Secondary | ICD-10-CM | POA: Diagnosis not present

## 2019-10-03 DIAGNOSIS — R110 Nausea: Secondary | ICD-10-CM | POA: Diagnosis present

## 2019-10-03 LAB — MYOCARDIAL PERFUSION IMAGING
LV dias vol: 44 mL (ref 62–150)
LV sys vol: 15 mL
Peak HR: 95 {beats}/min
Rest HR: 74 {beats}/min
SDS: 1
SRS: 0
SSS: 1
TID: 0.97

## 2019-10-03 MED ORDER — AMINOPHYLLINE 25 MG/ML IV SOLN
75.0000 mg | Freq: Once | INTRAVENOUS | Status: AC
  Administered 2019-10-03: 75 mg via INTRAVENOUS

## 2019-10-03 MED ORDER — TECHNETIUM TC 99M TETROFOSMIN IV KIT
31.6000 | PACK | Freq: Once | INTRAVENOUS | Status: AC | PRN
  Administered 2019-10-03: 31.6 via INTRAVENOUS
  Filled 2019-10-03: qty 32

## 2019-10-03 MED ORDER — REGADENOSON 0.4 MG/5ML IV SOLN
0.4000 mg | Freq: Once | INTRAVENOUS | Status: AC
  Administered 2019-10-03: 0.4 mg via INTRAVENOUS

## 2019-10-03 MED ORDER — TECHNETIUM TC 99M TETROFOSMIN IV KIT
10.4000 | PACK | Freq: Once | INTRAVENOUS | Status: AC | PRN
  Administered 2019-10-03: 10.4 via INTRAVENOUS
  Filled 2019-10-03: qty 11

## 2019-10-05 ENCOUNTER — Telehealth: Payer: Self-pay | Admitting: Internal Medicine

## 2019-10-05 DIAGNOSIS — Z01812 Encounter for preprocedural laboratory examination: Secondary | ICD-10-CM

## 2019-10-05 NOTE — Telephone Encounter (Signed)
-----   Message from Chrystie Nose, MD sent at 10/04/2019  9:43 AM EDT ----- Abnormal myoview stress test - 2 areas of ischemia. Given history of old bypass grafts and typical anginal symptoms, would recommend left heart cath to evaluate graft patency.  Dr Rexene Edison

## 2019-10-05 NOTE — Telephone Encounter (Signed)
Follow up  Pt's wife called, she said he has questions about pt's upcoming procedure

## 2019-10-05 NOTE — Telephone Encounter (Signed)
Spoke with patient about stress test results. He is agreeable to left heart cath.   Spoke with Clydie Braun in cath lab and scheduled procedure for Friday October 14, 2019 @ 10:30am with Dr. Katrinka Blazing Patient will have labs & COVID screening on Tuesday October 11, 2019 @ [facility]  Instructions sent to patient via MyChart & called

## 2019-10-05 NOTE — Telephone Encounter (Signed)
Spoke with the patients wife, Ms. Hibler and answered several questions regarding the patients upcoming cath. She verbalized understanding and thanked me for the call.

## 2019-10-06 NOTE — Telephone Encounter (Signed)
Called Billy Cisneros back today - reviewed the risks, benefits and alternatives of cath (Which he understands from previous procedures) - he is willing to proceed. No further questions.  Dr. Rennis Golden

## 2019-10-07 ENCOUNTER — Other Ambulatory Visit: Payer: Self-pay | Admitting: Interventional Cardiology

## 2019-10-07 DIAGNOSIS — R9439 Abnormal result of other cardiovascular function study: Secondary | ICD-10-CM

## 2019-10-07 DIAGNOSIS — I200 Unstable angina: Secondary | ICD-10-CM

## 2019-10-07 DIAGNOSIS — Z951 Presence of aortocoronary bypass graft: Secondary | ICD-10-CM

## 2019-10-11 ENCOUNTER — Other Ambulatory Visit (HOSPITAL_COMMUNITY)
Admission: RE | Admit: 2019-10-11 | Discharge: 2019-10-11 | Disposition: A | Discharge status: Home / Self Care | Payer: Medicare Other | Source: Ambulatory Visit | Attending: Interventional Cardiology | Admitting: Interventional Cardiology

## 2019-10-11 DIAGNOSIS — Z20822 Contact with and (suspected) exposure to covid-19: Secondary | ICD-10-CM | POA: Insufficient documentation

## 2019-10-11 DIAGNOSIS — Z01812 Encounter for preprocedural laboratory examination: Secondary | ICD-10-CM | POA: Insufficient documentation

## 2019-10-12 ENCOUNTER — Telehealth: Payer: Self-pay

## 2019-10-12 LAB — BASIC METABOLIC PANEL
BUN/Creatinine Ratio: 14 (ref 10–24)
BUN: 24 mg/dL (ref 8–27)
CO2: 23 mmol/L (ref 20–29)
Calcium: 9.8 mg/dL (ref 8.6–10.2)
Chloride: 101 mmol/L (ref 96–106)
Creatinine, Ser: 1.68 mg/dL — ABNORMAL HIGH (ref 0.76–1.27)
GFR calc Af Amer: 48 mL/min/{1.73_m2} — ABNORMAL LOW (ref >59)
GFR calc non Af Amer: 41 mL/min/{1.73_m2} — ABNORMAL LOW (ref >59)
Glucose: 119 mg/dL — ABNORMAL HIGH (ref 65–99)
Potassium: 4.8 mmol/L (ref 3.5–5.2)
Sodium: 141 mmol/L (ref 134–144)

## 2019-10-12 LAB — CBC
Hematocrit: 44.2 % (ref 37.5–51.0)
Hemoglobin: 15.2 g/dL (ref 13.0–17.7)
MCH: 31.4 pg (ref 26.6–33.0)
MCHC: 34.4 g/dL (ref 31.5–35.7)
MCV: 91 fL (ref 79–97)
Platelets: 235 10*3/uL (ref 150–450)
RBC: 4.84 x10E6/uL (ref 4.14–5.80)
RDW: 12.8 % (ref 11.6–15.4)
WBC: 7 10*3/uL (ref 3.4–10.8)

## 2019-10-12 LAB — SARS CORONAVIRUS 2 (TAT 6-24 HRS): SARS Coronavirus 2: NEGATIVE

## 2019-10-12 NOTE — Telephone Encounter (Signed)
Pt contacted pre-catheterization scheduled at [facility] for: 10/14/19 Verified arrival time and place: [facility] Main Entrance [facility]) at: 5:30 am.   GFR 41 pt to have 4 hours pre procedure hydration (cath lab guideline GFR <45 arrange 4 hours IVF pre procedure). Message sent to Dr. Katrinka Blazing.    No solid food after midnight prior to cath, clear liquids until 5 AM day of procedure. Contrast allergy:NO  DO NOT TAKE LISINOPRIL DAY BEFORE OR AFTER THE MORNING OF THE PROCEDURE. AM meds can be  taken pre-cath with sip of water including: ASA 81 mg  Confirmed patient has responsible adult to drive home post procedure and observe 24 hours after arriving home:   You are allowed ONE visitor in the waiting room during your procedure. Both you and your visitor must wear masks.      COVID-19 Pre-Screening Questions:   In the past 7 to 10 days have you had a cough,  shortness of breath, headache, congestion, fever (100 or greater) body aches, chills, sore throat, or sudden loss of taste or sense of smell? NO  Have you been around anyone with known Covid 19 in the past 7 to 10 days? NO  Have you been around anyone who is awaiting Covid 19 test results in the past 7 to 10 days? NO  Have you been around anyone who has mentioned symptoms of Covid 19 within the past 7 to 10 days? NO

## 2019-10-13 NOTE — H&P (Signed)
Abnormal myovue with CABG 20 years ago.

## 2019-10-14 ENCOUNTER — Ambulatory Visit (HOSPITAL_COMMUNITY)
Admission: RE | Admit: 2019-10-14 | Discharge: 2019-10-14 | Disposition: A | Discharge status: Home / Self Care | Payer: Medicare Other | Attending: Interventional Cardiology | Admitting: Interventional Cardiology

## 2019-10-14 ENCOUNTER — Other Ambulatory Visit: Payer: Self-pay

## 2019-10-14 ENCOUNTER — Encounter (HOSPITAL_COMMUNITY)
Admission: RE | Disposition: A | Discharge status: Home / Self Care | Payer: Self-pay | Source: Home / Self Care | Attending: Interventional Cardiology

## 2019-10-14 DIAGNOSIS — I2510 Atherosclerotic heart disease of native coronary artery without angina pectoris: Secondary | ICD-10-CM | POA: Insufficient documentation

## 2019-10-14 DIAGNOSIS — I200 Unstable angina: Secondary | ICD-10-CM

## 2019-10-14 DIAGNOSIS — Z7982 Long term (current) use of aspirin: Secondary | ICD-10-CM | POA: Insufficient documentation

## 2019-10-14 DIAGNOSIS — E785 Hyperlipidemia, unspecified: Secondary | ICD-10-CM | POA: Diagnosis present

## 2019-10-14 DIAGNOSIS — F419 Anxiety disorder, unspecified: Secondary | ICD-10-CM | POA: Diagnosis not present

## 2019-10-14 DIAGNOSIS — N183 Chronic kidney disease, stage 3 unspecified: Secondary | ICD-10-CM | POA: Diagnosis present

## 2019-10-14 DIAGNOSIS — E781 Pure hyperglyceridemia: Secondary | ICD-10-CM | POA: Insufficient documentation

## 2019-10-14 DIAGNOSIS — F329 Major depressive disorder, single episode, unspecified: Secondary | ICD-10-CM | POA: Diagnosis not present

## 2019-10-14 DIAGNOSIS — I10 Essential (primary) hypertension: Secondary | ICD-10-CM | POA: Diagnosis not present

## 2019-10-14 DIAGNOSIS — I25708 Atherosclerosis of coronary artery bypass graft(s), unspecified, with other forms of angina pectoris: Secondary | ICD-10-CM | POA: Diagnosis present

## 2019-10-14 DIAGNOSIS — Z951 Presence of aortocoronary bypass graft: Secondary | ICD-10-CM

## 2019-10-14 DIAGNOSIS — Z79899 Other long term (current) drug therapy: Secondary | ICD-10-CM | POA: Diagnosis not present

## 2019-10-14 DIAGNOSIS — K219 Gastro-esophageal reflux disease without esophagitis: Secondary | ICD-10-CM | POA: Diagnosis not present

## 2019-10-14 DIAGNOSIS — R079 Chest pain, unspecified: Secondary | ICD-10-CM | POA: Diagnosis present

## 2019-10-14 DIAGNOSIS — M109 Gout, unspecified: Secondary | ICD-10-CM | POA: Diagnosis not present

## 2019-10-14 DIAGNOSIS — Z8249 Family history of ischemic heart disease and other diseases of the circulatory system: Secondary | ICD-10-CM | POA: Diagnosis not present

## 2019-10-14 DIAGNOSIS — M5412 Radiculopathy, cervical region: Secondary | ICD-10-CM | POA: Insufficient documentation

## 2019-10-14 DIAGNOSIS — I25118 Atherosclerotic heart disease of native coronary artery with other forms of angina pectoris: Secondary | ICD-10-CM | POA: Insufficient documentation

## 2019-10-14 DIAGNOSIS — R9439 Abnormal result of other cardiovascular function study: Secondary | ICD-10-CM

## 2019-10-14 HISTORY — PX: LEFT HEART CATH AND CORS/GRAFTS ANGIOGRAPHY: CATH118250

## 2019-10-14 SURGERY — LEFT HEART CATH AND CORS/GRAFTS ANGIOGRAPHY
Anesthesia: LOCAL

## 2019-10-14 MED ORDER — IOHEXOL 350 MG/ML SOLN
INTRAVENOUS | Status: DC | PRN
  Administered 2019-10-14: 85 mL

## 2019-10-14 MED ORDER — IOHEXOL 350 MG/ML SOLN
INTRAVENOUS | Status: AC
  Filled 2019-10-14: qty 1

## 2019-10-14 MED ORDER — FENTANYL CITRATE (PF) 100 MCG/2ML IJ SOLN
INTRAMUSCULAR | Status: DC | PRN
  Administered 2019-10-14: 25 ug via INTRAVENOUS

## 2019-10-14 MED ORDER — ISOSORBIDE MONONITRATE ER 30 MG PO TB24
30.0000 mg | ORAL_TABLET | Freq: Every day | ORAL | 11 refills | Status: DC
Start: 2019-10-14 — End: 2020-10-30

## 2019-10-14 MED ORDER — FENTANYL CITRATE (PF) 100 MCG/2ML IJ SOLN
INTRAMUSCULAR | Status: AC
  Filled 2019-10-14: qty 2

## 2019-10-14 MED ORDER — HEPARIN (PORCINE) IN NACL 1000-0.9 UT/500ML-% IV SOLN
INTRAVENOUS | Status: AC
  Filled 2019-10-14: qty 1000

## 2019-10-14 MED ORDER — METOPROLOL SUCCINATE ER 25 MG PO TB24
25.0000 mg | ORAL_TABLET | Freq: Every day | ORAL | 11 refills | Status: DC
Start: 2019-10-14 — End: 2020-10-30

## 2019-10-14 MED ORDER — LIDOCAINE HCL (PF) 1 % IJ SOLN
INTRAMUSCULAR | Status: DC | PRN
  Administered 2019-10-14: 15 mL via INTRADERMAL

## 2019-10-14 MED ORDER — MIDAZOLAM HCL 2 MG/2ML IJ SOLN
INTRAMUSCULAR | Status: DC | PRN
  Administered 2019-10-14 (×2): 1 mg via INTRAVENOUS

## 2019-10-14 MED ORDER — SODIUM CHLORIDE 0.9% FLUSH: 3.0000 mL | INTRAVENOUS | Status: DC | PRN

## 2019-10-14 MED ORDER — SODIUM CHLORIDE 0.9 % IV SOLN: INTRAVENOUS | Status: DC

## 2019-10-14 MED ORDER — ASPIRIN 81 MG PO CHEW: 81.0000 mg | CHEWABLE_TABLET | ORAL | Status: DC

## 2019-10-14 MED ORDER — LIDOCAINE HCL (PF) 1 % IJ SOLN
INTRAMUSCULAR | Status: AC
  Filled 2019-10-14: qty 30

## 2019-10-14 MED ORDER — MIDAZOLAM HCL 2 MG/2ML IJ SOLN
INTRAMUSCULAR | Status: AC
  Filled 2019-10-14: qty 2

## 2019-10-14 SURGICAL SUPPLY — 10 items
CATH INFINITI 5 FR IM (CATHETERS) ×1 IMPLANT
CATH INFINITI 5FR MPB2 (CATHETERS) ×1 IMPLANT
CLOSURE MYNX CONTROL 5F (Vascular Products) ×1 IMPLANT
KIT HEART LEFT (KITS) ×2 IMPLANT
PACK CARDIAC CATHETERIZATION (CUSTOM PROCEDURE TRAY) ×2 IMPLANT
SHEATH PINNACLE 5F 10CM (SHEATH) ×1 IMPLANT
SHEATH PROBE COVER 6X72 (BAG) ×1 IMPLANT
TRANSDUCER W/STOPCOCK (MISCELLANEOUS) ×2 IMPLANT
TUBING CIL FLEX 10 FLL-RA (TUBING) ×2 IMPLANT
WIRE EMERALD 3MM-J .035X150CM (WIRE) ×1 IMPLANT

## 2019-10-14 NOTE — Op Note (Signed)
·   Diagnostic coronary angiography and bypass graft angiography via right femoral using real-time vascular ultrasound for arterial access with single anterior wall stick of the common femoral artery above the bifurcation.  Occluded saphenous vein graft to diagonal  Occluded sequential saphenous vein graft  to obtuse marginal, with patent ramus intermedius anastomosis and widely patent vessel.  The ramus fills diagonals retrograde.  Distal circumflex has left to left collaterals.  Patent RIMA to PDA  Patent LIMA to LAD with LAD occlusion distal to the diagonal branches.  Left main is small, diffusely diseased, and contains 70% stenosis distally before supplying 2 small twiglike branches.  Native LAD is totally occluded at the ostium and total occlusion and beyond diagonals in the mid vessel.  Circumflex is totally occluded at the ostium.  The native right coronary is dominant, contains severe diffuse mid to distal disease and there is competitive flow with the RIMA to the PDA.  Left ventricular hemodynamics were recorded but contrast injection was not performed to decrease contrast load.  IMPRESSION: Occlusion of the obtuse marginal limb of the sequential graft to ramus intermedius and obtuse marginal..  Medical management.  No PCI options.

## 2019-10-14 NOTE — Interval H&P Note (Signed)
Cath Lab Visit (complete for each Cath Lab visit)  Clinical Evaluation Leading to the Procedure:   ACS: No.  Non-ACS:    Anginal Classification: CCS III  Anti-ischemic medical therapy: Minimal Therapy (1 class of medications)  Non-Invasive Test Results: Intermediate-risk stress test findings: cardiac mortality 1-3%/year  Prior CABG: Previous CABG      History and Physical Interval Note:  10/14/2019 11:51 AM  [name].  has presented today for surgery, with the diagnosis of unstable angina.  The various methods of treatment have been discussed with the patient and family. After consideration of risks, benefits and other options for treatment, the patient has consented to  Procedure(s): LEFT HEART CATH AND CORS/GRAFTS ANGIOGRAPHY (N/A) as a surgical intervention.  The patient's history has been reviewed, patient examined, no change in status, stable for surgery.  I have reviewed the patient's chart and labs.  Questions were answered to the patient's satisfaction.     Lyn Records III

## 2019-10-14 NOTE — Discharge Instructions (Signed)
Start Toprol-XL 25 mg/day for 1 week.    If still having angina on exertion after 1 week of Toprol-XL, add Imdur 30 mg daily.  Dr. Rennis Golden will further titrate antianginal therapy to achieve better control of symptoms.     Radial Site Care  This sheet gives you information about how to care for yourself after your procedure. Your health care provider may also give you more specific instructions. If you have problems or questions, contact your health care provider. What can I expect after the procedure? After the procedure, it is common to have:  Bruising and tenderness at the catheter insertion area. Follow these instructions at home: Medicines  Take over-the-counter and prescription medicines only as told by your health care provider. Insertion site care  Follow instructions from your health care provider about how to take care of your insertion site. Make sure you: ? Wash your hands with soap and water before you change your bandage (dressing). If soap and water are not available, use hand sanitizer. ? Change your dressing as told by your health care provider.   Check your insertion site every day for signs of infection. Check for: ? Redness, swelling, or pain. ? Fluid or blood. ? Pus or a bad smell. ? Warmth.  Do not take baths, swim, or use a hot tub until your health care provider approves.  You may shower 24-48 hours after the procedure, or as directed by your health care provider. ? Remove the dressing and gently wash the site with plain soap and water. ? Pat the area dry with a clean towel. ? Do not rub the site. That could cause bleeding.  Do not apply powder or lotion to the site. Activity   For 24 hours after the procedure, or as directed by your health care provider: ? Do not flex or bend the affected arm. ? Do not push or pull heavy objects with the affected arm. ? Do not drive yourself home from the hospital or clinic. You may drive 24 hours after the  procedure unless your health care provider tells you not to. ? Do not operate machinery or power tools.  Do not lift anything that is heavier than 10 lb (4.5 kg), or the limit that you are told, until your health care provider says that it is safe.  Ask your health care provider when it is okay to: ? Return to work or school. ? Resume usual physical activities or sports. ? Resume sexual activity. General instructions  If the catheter site starts to bleed, raise your arm and put firm pressure on the site. If the bleeding does not stop, get help right away. This is a medical emergency.  If you went home on the same day as your procedure, a responsible adult should be with you for the first 24 hours after you arrive home.  Keep all follow-up visits as told by your health care provider. This is important. Contact a health care provider if:  You have a fever.  You have redness, swelling, or yellow drainage around your insertion site. Get help right away if:  You have unusual pain at the radial site.  The catheter insertion area swells very fast.  The insertion area is bleeding, and the bleeding does not stop when you hold steady pressure on the area.  Your arm or hand becomes pale, cool, tingly, or numb. These symptoms may represent a serious problem that is an emergency. Do not wait to see if the  symptoms will go away. Get medical help right away. Call your local emergency services (911 in the [place] Do not drive yourself to the hospital. Summary  After the procedure, it is common to have bruising and tenderness at the site.  Follow instructions from your health care provider about how to take care of your radial site wound. Check the wound every day for signs of infection.  Do not lift anything that is heavier than 10 lb (4.5 kg), or the limit that you are told, until your health care provider says that it is safe. This information is not intended to replace advice given to you by  your health care provider. Make sure you discuss any questions you have with your health care provider. Document Revised: 06/03/2017 Document Reviewed: 06/03/2017 Elsevier Patient Education  [place]. Moderate Conscious Sedation, Adult, Care After These instructions provide you with information about caring for yourself after your procedure. Your health care provider may also give you more specific instructions. Your treatment has been planned according to current medical practices, but problems sometimes occur. Call your health care provider if you have any problems or questions after your procedure. What can I expect after the procedure? After your procedure, it is common:  To feel sleepy for several hours.  To feel clumsy and have poor balance for several hours.  To have poor judgment for several hours.  To vomit if you eat too soon. Follow these instructions at home: For at least 24 hours after the procedure:   Do not: ? Participate in activities where you could fall or become injured. ? Drive. ? Use heavy machinery. ? Drink alcohol. ? Take sleeping pills or medicines that cause drowsiness. ? Make important decisions or sign legal documents. ? Take care of children on your own.  Rest. Eating and drinking  Follow the diet recommended by your health care provider.  If you vomit: ? Drink water, juice, or soup when you can drink without vomiting. ? Make sure you have little or no nausea before eating solid foods. General instructions  Have a responsible adult stay with you until you are awake and alert.  Take over-the-counter and prescription medicines only as told by your health care provider.  If you smoke, do not smoke without supervision.  Keep all follow-up visits as told by your health care provider. This is important. Contact a health care provider if:  You keep feeling nauseous or you keep vomiting.  You feel light-headed.  You develop a  rash.  You have a fever. Get help right away if:  You have trouble breathing. This information is not intended to replace advice given to you by your health care provider. Make sure you discuss any questions you have with your health care provider. Document Revised: 04/10/2017 Document Reviewed: 08/18/2015 Elsevier Patient Education  [place].   Femoral Site Care This sheet gives you information about how to care for yourself after your procedure. Your health care provider may also give you more specific instructions. If you have problems or questions, contact your health care provider. What can I expect after the procedure? After the procedure, it is common to have:  Bruising that usually fades within 1-2 weeks.  Tenderness at the site. Follow these instructions at home: Wound care  Follow instructions from your health care provider about how to take care of your insertion site. Make sure you: ? Wash your hands with soap and water before you change your bandage (dressing). If  soap and water are not available, use hand sanitizer. ? Change your dressing as told by your health care provider. ? Leave stitches (sutures), skin glue, or adhesive strips in place. These skin closures may need to stay in place for 2 weeks or longer. If adhesive strip edges start to loosen and curl up, you may trim the loose edges. Do not remove adhesive strips completely unless your health care provider tells you to do that.  Do not take baths, swim, or use a hot tub until your health care provider approves.  You may shower 24-48 hours after the procedure or as told by your health care provider. ? Gently wash the site with plain soap and water. ? Pat the area dry with a clean towel. ? Do not rub the site. This may cause bleeding.  Do not apply powder or lotion to the site. Keep the site clean and dry.  Check your femoral site every day for signs of infection. Check for: ? Redness, swelling, or  pain. ? Fluid or blood. ? Warmth. ? Pus or a bad smell. Activity  For the first 2-3 days after your procedure, or as long as directed: ? Avoid climbing stairs as much as possible. ? Do not squat.  Do not lift anything that is heavier than 10 lb (4.5 kg), or the limit that you are told, until your health care provider says that it is safe.  Rest as directed. ? Avoid sitting for a long time without moving. Get up to take short walks every 1-2 hours.  Do not drive for 24 hours if you were given a medicine to help you relax (sedative). General instructions  Take over-the-counter and prescription medicines only as told by your health care provider.  Keep all follow-up visits as told by your health care provider. This is important. Contact a health care provider if you have:  A fever or chills.  You have redness, swelling, or pain around your insertion site. Get help right away if:  The catheter insertion area swells very fast.  You pass out.  You suddenly start to sweat or your skin gets clammy.  The catheter insertion area is bleeding, and the bleeding does not stop when you hold steady pressure on the area.  The area near or just beyond the catheter insertion site becomes pale, cool, tingly, or numb. These symptoms may represent a serious problem that is an emergency. Do not wait to see if the symptoms will go away. Get medical help right away. Call your local emergency services (911 in the [place] Do not drive yourself to the hospital. Summary  After the procedure, it is common to have bruising that usually fades within 1-2 weeks.  Check your femoral site every day for signs of infection.  Do not lift anything that is heavier than 10 lb (4.5 kg), or the limit that you are told, until your health care provider says that it is safe. This information is not intended to replace advice given to you by your health care provider. Make sure you discuss any questions you have with  your health care provider. Document Revised: 05/11/2017 Document Reviewed: 05/11/2017 Elsevier Patient Education  2020 [employer].

## 2019-10-17 MED FILL — Heparin Sod (Porcine)-NaCl IV Soln 1000 Unit/500ML-0.9%: INTRAVENOUS | Qty: 1000 | Status: AC

## 2019-11-23 ENCOUNTER — Ambulatory Visit: Payer: Medicare Other | Admitting: Internal Medicine

## 2020-01-24 ENCOUNTER — Ambulatory Visit: Payer: Medicare Other | Admitting: Internal Medicine

## 2020-03-15 ENCOUNTER — Ambulatory Visit: Payer: Medicare Other | Admitting: Internal Medicine

## 2020-04-03 ENCOUNTER — Ambulatory Visit: Payer: Medicare Other | Admitting: Internal Medicine

## 2020-05-14 ENCOUNTER — Other Ambulatory Visit: Payer: Self-pay | Admitting: Orthopedic Surgery

## 2020-05-14 DIAGNOSIS — M19012 Primary osteoarthritis, left shoulder: Secondary | ICD-10-CM

## 2020-05-14 DIAGNOSIS — M75102 Unspecified rotator cuff tear or rupture of left shoulder, not specified as traumatic: Secondary | ICD-10-CM

## 2020-05-14 DIAGNOSIS — M25512 Pain in left shoulder: Secondary | ICD-10-CM

## 2020-05-21 ENCOUNTER — Other Ambulatory Visit: Payer: Self-pay

## 2020-05-21 ENCOUNTER — Ambulatory Visit
Admission: RE | Admit: 2020-05-21 | Discharge: 2020-05-21 | Disposition: A | Discharge status: Home / Self Care | Payer: Medicare Other | Source: Ambulatory Visit | Attending: Orthopedic Surgery | Admitting: Orthopedic Surgery

## 2020-05-21 DIAGNOSIS — M19012 Primary osteoarthritis, left shoulder: Secondary | ICD-10-CM

## 2020-05-21 DIAGNOSIS — M25512 Pain in left shoulder: Secondary | ICD-10-CM

## 2020-05-21 DIAGNOSIS — M75102 Unspecified rotator cuff tear or rupture of left shoulder, not specified as traumatic: Secondary | ICD-10-CM | POA: Diagnosis present

## 2020-06-14 ENCOUNTER — Ambulatory Visit: Payer: Medicare Other | Admitting: Internal Medicine

## 2020-08-06 ENCOUNTER — Ambulatory Visit: Payer: Medicare Other | Admitting: Internal Medicine

## 2020-09-26 ENCOUNTER — Ambulatory Visit: Payer: Medicare Other | Admitting: Internal Medicine

## 2020-10-29 NOTE — Progress Notes (Signed)
Cardiology Clinic Note   Patient Name: Billy Cisneros. Date of Encounter: 10/30/2020  Primary Care Provider:  Lynnea Ferrier, MD Primary Cardiologist:  None  Patient Profile    Billy Cisneros. 70 year old male presents the clinic today for follow-up evaluation of his coronary artery disease and essential hypertension.    Past Medical History    Past Medical History:  Diagnosis Date   Anxiety    Coronary artery disease    Depression    GERD (gastroesophageal reflux disease)    Gout    Hypertension    Past Surgical History:  Procedure Laterality Date   CARDIAC CATHETERIZATION     CARDIAC SURGERY     COLONOSCOPY WITH PROPOFOL N/A 05/25/2017   Procedure: COLONOSCOPY WITH PROPOFOL;  Surgeon: Wyline Mood, MD;  Location: [facility] ENDOSCOPY;  Service: Gastroenterology;  Laterality: N/A;   CORONARY ARTERY BYPASS GRAFT     ESOPHAGOGASTRODUODENOSCOPY N/A 10/27/2017   Procedure: ESOPHAGOGASTRODUODENOSCOPY (EGD);  Surgeon: Toney Reil, MD;  Location: [facility] ENDOSCOPY;  Service: Gastroenterology;  Laterality: N/A;   ESOPHAGOGASTRODUODENOSCOPY (EGD) WITH PROPOFOL N/A 11/19/2017   Procedure: ESOPHAGOGASTRODUODENOSCOPY (EGD) WITH PROPOFOL;  Surgeon: Toney Reil, MD;  Location: [facility] ENDOSCOPY;  Service: Gastroenterology;  Laterality: N/A;   LEFT HEART CATH AND CORS/GRAFTS ANGIOGRAPHY N/A 10/14/2019   Procedure: LEFT HEART CATH AND CORS/GRAFTS ANGIOGRAPHY;  Surgeon: Lyn Records, MD;  Location: [facility] INVASIVE CV LAB;  Service: Cardiovascular;  Laterality: N/A;    Allergies  No Known Allergies  History of Present Illness    Billy Cisneros. has a PMH of coronary artery disease status post CABG in 2000 (LIMA to LAD, RIMA to RCA, SVG to OM1, diagonal and ramus) underwent cardiac catheterization 10/2019 which showed patent LIMA-LAD, patent RIMA-PDA, total occluded distal left main, totally occluded ostial circumflex, totally occluded ostial LAD and normal LVEDP.   Medical management was recommended.  His PMH also includes HTN, anxiety, gout, and GERD.  He was seen in follow-up by Dr. Rennis Golden on 09/23/2019.  During that time he reported difficulty with chest discomfort.  He described exertional type chest pain with left arm heaviness.  His symptoms improved with rest.  It was noted that his LDL cholesterol was 62 but his triglycerides remained elevated at 320.  He underwent cardiac catheterization by Dr. Katrinka Blazing 6/21.  At that time his Imdur was uptitrated.  He presents the clinic today for follow-up evaluation states he has not noticed a difference in his chest discomfort with the addition of metoprolol.  He did not start his Imdur medication.  He reports that with increased physical activity he notices chest discomfort.  He gives an example of taking out his trash.  When he rests after increased activity his chest discomfort eases off and goes away.  We reviewed his angiography and he and his wife expressed understanding.  He reports that he generally is fairly sedentary.  He also reports that he does not eat much fiber in his diet.  We reviewed his cholesterol panel and he reports that over the last several months he has not watched his diet as closely.  I will have him start his Imdur, continue metoprolol, increase his physical activity, start fiber supplementation, and follow-up in 6 months.  We will refill his metoprolol.  Today he denies chest pain, shortness of breath, lower extremity edema, fatigue, palpitations, melena, hematuria, hemoptysis, diaphoresis, weakness, presyncope, syncope, orthopnea, and PND.     Home Medications    Prior  to Admission medications   Medication Sig Start Date End Date Taking? Authorizing Provider  ALPRAZolam (XANAX) 0.5 MG tablet TAKE 1 TABLET BY MOUTH 3 TIMES A DAY AS NEEDED FOR ANXIETY. Patient taking differently: Take 0.5 mg by mouth 3 (three) times daily as needed for anxiety. TAKE 1 TABLET BY MOUTH 3 TIMES A DAY AS NEEDED  FOR ANXIETY. 06/24/17   Ellyn Hack, MD  aspirin EC 81 MG tablet Take 1 tablet (81 mg total) by mouth daily. For heart attack prevention. 11/22/11   Tamala Julian, PA-C  buPROPion (WELLBUTRIN XL) 300 MG 24 hr tablet Take 1 tablet (300 mg total) by mouth daily. 06/24/17   Ellyn Hack, MD  HYDROcodone-acetaminophen (NORCO/VICODIN) 5-325 MG tablet Take 1 tablet by mouth every 6 (six) hours as needed for moderate pain.  12/29/17   [provider]  icosapent Ethyl (VASCEPA) 1 g capsule Take 2 capsules (2 g total) by mouth 2 (two) times daily. 09/23/19   Hilty, Lisette Abu, MD  isosorbide mononitrate (IMDUR) 30 MG 24 hr tablet Take 1 tablet (30 mg total) by mouth daily. 10/14/19 10/13/20  Lyn Records, MD  lisinopril (PRINIVIL,ZESTRIL) 10 MG tablet Take 1 tablet (10 mg total) by mouth daily. 06/24/17   Ellyn Hack, MD  metoprolol succinate (TOPROL XL) 25 MG 24 hr tablet Take 1 tablet (25 mg total) by mouth daily. 10/14/19 10/13/20  Lyn Records, MD  Multiple Vitamins-Minerals (MULTIVITAMIN WITH MINERALS) tablet Take 1 tablet by mouth daily.    [provider]  omeprazole (PRILOSEC) 20 MG capsule Take 1 capsule (20 mg total) by mouth 2 (two) times daily before a meal. 06/24/17   Ellyn Hack, MD  simvastatin (ZOCOR) 40 MG tablet Take 1 tablet (40 mg total) by mouth at bedtime. 06/24/17   Ellyn Hack, MD  valACYclovir (VALTREX) 500 MG tablet Take 500 mg by mouth 2 (two) times daily as needed (outbreak).     [provider]  vitamin B-12 (CYANOCOBALAMIN) 500 MCG tablet Take 500 mcg by mouth daily.    [provider]  zolpidem (AMBIEN) 10 MG tablet Take 1 tablet (10 mg total) by mouth at bedtime. Patient taking differently: Take 10 mg by mouth at bedtime as needed for sleep.  06/24/17   Ellyn Hack, MD    Family History    Family History  Problem Relation Age of Onset   Heart attack Mother    Hyperlipidemia Mother    Hypertension Mother     Cancer Father        lung   Diabetes Sister    Diabetes Brother    Asthma Daughter    Cancer Maternal Grandmother    Stroke Maternal Grandfather    Heart attack Paternal Grandfather    He indicated that his mother is deceased. He indicated that his father is deceased. He indicated that his sister is alive. He indicated that his brother is alive. He indicated that his maternal grandmother is deceased. He indicated that his maternal grandfather is deceased. He indicated that his paternal grandmother is deceased. He indicated that his paternal grandfather is deceased. He indicated that his daughter is alive.  Social History    Social History   Socioeconomic History   Marital status: Married    Spouse name: Not on file   Number of children: Not on file   Years of education: Not on file   Highest education level: Not  on file  Occupational History   Not on file  Tobacco Use   Smoking status: Never   Smokeless tobacco: Never  Vaping Use   Vaping Use: Never used  Substance and Sexual Activity   Alcohol use: Yes    Alcohol/week: 0.0 standard drinks    Comment: 3 beer a month   Drug use: Yes    Frequency: 2.0 times per week    Types: Cocaine    Comment: last use 15-20 years per patient   Sexual activity: Yes    Birth control/protection: None  Other Topics Concern   Not on file  Social History Narrative   Not on file   Social Determinants of Health   Financial Resource Strain: Not on file  Food Insecurity: Not on file  Transportation Needs: Not on file  Physical Activity: Not on file  Stress: Not on file  Social Connections: Not on file  Intimate Partner Violence: Not on file     Review of Systems    General:  No chills, fever, night sweats or weight changes.  Cardiovascular:  No chest pain, dyspnea on exertion, edema, orthopnea, palpitations, paroxysmal nocturnal dyspnea. Dermatological: No rash, lesions/masses Respiratory: No cough, dyspnea Urologic: No hematuria,  dysuria Abdominal:   No nausea, vomiting, diarrhea, bright red blood per rectum, melena, or hematemesis Neurologic:  No visual changes, wkns, changes in mental status. All other systems reviewed and are otherwise negative except as noted above.  Physical Exam    VS:  BP 130/80 (BP Location: Left Arm)   Pulse 68   Ht 5\' 5"  (1.651 m)   Wt 169 lb 6.4 oz (76.8 kg)   SpO2 97%   BMI 28.19 kg/m  , BMI Body mass index is 28.19 kg/m. GEN: Well nourished, well developed, in no acute distress. HEENT: normal. Neck: Supple, no JVD, carotid bruits, or masses. Cardiac: RRR, no murmurs, rubs, or gallops. No clubbing, cyanosis, edema.  Radials/DP/PT 2+ and equal bilaterally.  Respiratory:  Respirations regular and unlabored, clear to auscultation bilaterally. GI: Soft, nontender, nondistended, BS + x 4. MS: no deformity or atrophy. Skin: warm and dry, no rash. Neuro:  Strength and sensation are intact. Psych: Normal affect.  Accessory Clinical Findings    Recent Labs: No results found for requested labs within last 8760 hours.   Recent Lipid Panel    Component Value Date/Time   CHOL 173 09/04/2017 2338   CHOL 146 01/08/2015 0925   CHOL 141 08/06/2011 0327   TRIG 201 (H) 09/04/2017 2338   TRIG 375 (H) 08/06/2011 0327   HDL 39 (L) 09/04/2017 2338   HDL 42 01/08/2015 0925   HDL 25 (L) 08/06/2011 0327   CHOLHDL 4.4 09/04/2017 2338   VLDL 40 09/04/2017 2338   VLDL 75 (H) 08/06/2011 0327   LDLCALC 94 09/04/2017 2338   LDLCALC 76 01/08/2015 0925   LDLCALC 41 08/06/2011 0327    ECG personally reviewed by me today-none today.  Cardiac catheterization 10/14/2019 Patent saphenous vein graft to the ramus intermedius but occlusion occlusion of the saphenous vein graft limb to the obtuse marginal.   Patent LIMA to distal LAD Patent RIMA to PDA Totally occluded distal left main Totally occluded ostial circumflex Totally occluded ostial LAD Normal LV end-diastolic pressure.    RECOMMENDATIONS:   Toprol-XL 25 mg/day After 1 week if angina is still significant, add Imdur 30 mg/day. Further titrate anti-ischemic therapy until relief of symptoms. No interventional targets.    Diagnostic Dominance: Right  Intervention   Assessment & Plan   1.  Coronary artery disease-no chest pain today.  Underwent cardiac catheterization 6/21which showed patent LIMA-LAD, patent RIMA-PDA, total occluded distal left main, totally occluded ostial circumflex, totally occluded ostial LAD and normal LVEDP.  Medical management was recommended.  Details above. Continue aspirin, Vascepa, start Imdur, metoprolol, simvastatin Heart healthy low-sodium diet-salty 6 given Increase physical activity as tolerated  Essential hypertension-BP today 130/80.  Well-controlled at home. Continue metoprolol, Imdur, lisinopril Heart healthy low-sodium diet-salty 6 given Increase physical activity as tolerated  Hyperlipidemia-LDL 84 on 05/21/2020 Continue Vascepa, simvastatin Heart healthy low-sodium high-fiber diet Increase physical activity as tolerated Follows with PCP Fiber supplement recommended  Disposition: Follow-up with Dr. Rennis GoldenHilty in 6 months.  Thomasene RippleJesse M. Jaycee Pelzer NP-C    10/30/2020, 12:18 PM [facility] [address] Office [phone] Fax [phone]  Notice: This dictation was prepared with Dragon dictation along with smaller phrase technology. Any transcriptional errors that result from this process are unintentional and may not be corrected upon review.  I spent 15 minutes examining this patient, reviewing medications, and using patient centered shared decision making involving her cardiac care.  Prior to her visit I spent greater than 20 minutes reviewing her past medical history,  medications, and prior cardiac tests.

## 2020-10-30 ENCOUNTER — Other Ambulatory Visit: Payer: Self-pay

## 2020-10-30 ENCOUNTER — Telehealth: Payer: Self-pay | Admitting: General Practice

## 2020-10-30 ENCOUNTER — Encounter: Payer: Self-pay | Admitting: General Practice

## 2020-10-30 ENCOUNTER — Ambulatory Visit: Payer: Medicare Other | Admitting: General Practice

## 2020-10-30 VITALS — BP 130/80 | HR 68 | Ht 65.0 in | Wt 169.4 lb

## 2020-10-30 DIAGNOSIS — E782 Mixed hyperlipidemia: Secondary | ICD-10-CM | POA: Diagnosis not present

## 2020-10-30 DIAGNOSIS — I200 Unstable angina: Secondary | ICD-10-CM | POA: Diagnosis not present

## 2020-10-30 DIAGNOSIS — I10 Essential (primary) hypertension: Secondary | ICD-10-CM | POA: Diagnosis not present

## 2020-10-30 DIAGNOSIS — Z951 Presence of aortocoronary bypass graft: Secondary | ICD-10-CM | POA: Diagnosis not present

## 2020-10-30 MED ORDER — ISOSORBIDE MONONITRATE ER 30 MG PO TB24: 30.0000 mg | ORAL_TABLET | Freq: Every day | ORAL | 11 refills | Status: DC

## 2020-10-30 MED ORDER — ISOSORBIDE MONONITRATE ER 30 MG PO TB24: 30.0000 mg | ORAL_TABLET | Freq: Every day | ORAL | 3 refills | Status: DC

## 2020-10-30 MED ORDER — METOPROLOL SUCCINATE ER 25 MG PO TB24: 25.0000 mg | ORAL_TABLET | Freq: Every day | ORAL | 6 refills | Status: DC

## 2020-10-30 NOTE — Telephone Encounter (Signed)
*  STAT* If patient is at the pharmacy, call can be transferred to refill team.   1. Which medications need to be refilled? (please list name of each medication and dose if known) isosorbide mononitrate (IMDUR) 30 MG 24 hr tablet  2. Which pharmacy/location (including street and city if local pharmacy) is medication to be sent to?[id number] - [place], [place] - [address]  3. Do they need a 30 day or 90 day supply? 90 day supply   PT is out of this medication

## 2020-10-30 NOTE — Patient Instructions (Signed)
Medication Instructions:  START ISOSORBIDE 30MG  DAILY *If you need a refill on your cardiac medications before your next appointment, please call your pharmacy*  Lab Work: NONE  Testing/Procedures: NONE  Special Instructions TAKE EITHER BENEFIBER OR METAMUCIL   PLEASE READ AND FOLLOW SALTY 6-ATTACHED-1,800 mg daily  PLEASE INCREASE PHYSICAL ACTIVITY SLOWLY AS TOLERATED-GOAL IS 150 MINUTES/WEEKLY  Follow-Up: Your next appointment:  6 month(s) In Person with K. Hilty, MD OR IF UNAVAILABLE JESSE CLEAVER, FNP-C   Please call our office 2 months in advance to schedule this appointment   At [facility], you and your health needs are our priority.  As part of our continuing mission to provide you with exceptional heart care, we have created designated Provider Care Teams.  These Care Teams include your primary Cardiologist (physician) and Advanced Practice Providers (APPs -  Physician Assistants and Nurse Practitioners) who all work together to provide you with the care you need, when you need it.

## 2020-10-30 NOTE — Telephone Encounter (Signed)
Refills for patient's Isosorbide Mononitrate has been sent to the pharmacy.

## 2021-01-21 ENCOUNTER — Telehealth: Payer: Self-pay | Admitting: General Practice

## 2021-01-21 MED ORDER — OMEGA-3-ACID ETHYL ESTERS 1 G PO CAPS: 1.0000 g | ORAL_CAPSULE | Freq: Two times a day (BID) | ORAL | 1 refills | Status: DC

## 2021-01-21 NOTE — Telephone Encounter (Signed)
*  STAT* If patient is at the pharmacy, call can be transferred to refill team.   1. Which medications need to be refilled? (please list name of each medication and dose if known)  omega-3 acid ethyl esters (LOVAZA) 1 g capsule  2. Which pharmacy/location (including street and city if local pharmacy) is medication to be sent to? [facility] [address] - [place], [place] - [address]  3. Do they need a 30 day or 90 day supply?  30 day supply  Patient is completely out of medication

## 2021-06-21 ENCOUNTER — Ambulatory Visit: Payer: Medicare Other | Admitting: General Practice

## 2021-08-06 NOTE — Progress Notes (Deleted)
? ?Cardiology Clinic Note  ? ?Patient Name: Billy Cisneros. ?Date of Encounter: 08/06/2021 ? ?Primary Care Provider:  Lynnea Ferrier, MD ?Primary Cardiologist:  None ? ?Patient Profile  ?  ?Billy Cisneros 71 year old male presents the clinic today for follow-up evaluation of his coronary artery disease and essential hypertension.   ? ?Past Medical History  ?  ?Past Medical History:  ?Diagnosis Date  ? Anxiety   ? Coronary artery disease   ? Depression   ? GERD (gastroesophageal reflux disease)   ? Gout   ? Hypertension   ? ?Past Surgical History:  ?Procedure Laterality Date  ? CARDIAC CATHETERIZATION    ? CARDIAC SURGERY    ? COLONOSCOPY WITH PROPOFOL N/A 05/25/2017  ? Procedure: COLONOSCOPY WITH PROPOFOL;  Surgeon: Wyline Mood, MD;  Location: [facility] ENDOSCOPY;  Service: Gastroenterology;  Laterality: N/A;  ? CORONARY ARTERY BYPASS GRAFT    ? ESOPHAGOGASTRODUODENOSCOPY N/A 10/27/2017  ? Procedure: ESOPHAGOGASTRODUODENOSCOPY (EGD);  Surgeon: Toney Reil, MD;  Location: [facility] ENDOSCOPY;  Service: Gastroenterology;  Laterality: N/A;  ? ESOPHAGOGASTRODUODENOSCOPY (EGD) WITH PROPOFOL N/A 11/19/2017  ? Procedure: ESOPHAGOGASTRODUODENOSCOPY (EGD) WITH PROPOFOL;  Surgeon: Toney Reil, MD;  Location: [facility] ENDOSCOPY;  Service: Gastroenterology;  Laterality: N/A;  ? LEFT HEART CATH AND CORS/GRAFTS ANGIOGRAPHY N/A 10/14/2019  ? Procedure: LEFT HEART CATH AND CORS/GRAFTS ANGIOGRAPHY;  Surgeon: Lyn Records, MD;  Location: [facility] INVASIVE CV LAB;  Service: Cardiovascular;  Laterality: N/A;  ? ? ?Allergies ? ?No Known Allergies ? ?History of Present Illness  ?  ?Billy Cisneros. has a PMH of coronary artery disease status post CABG in 2000 (LIMA to LAD, RIMA to RCA, SVG to OM1, diagonal and ramus) underwent cardiac catheterization 10/2019 which showed patent LIMA-LAD, patent RIMA-PDA, total occluded distal left main, totally occluded ostial circumflex, totally occluded ostial LAD and normal LVEDP.   Medical management was recommended.  His PMH also includes HTN, anxiety, gout, and GERD. ? ?He was seen in follow-up by Dr. Rennis Golden on 09/23/2019.  During that time he reported difficulty with chest discomfort.  He described exertional type chest pain with left arm heaviness.  His symptoms improved with rest.  It was noted that his LDL cholesterol was 62 but his triglycerides remained elevated at 320.  He underwent cardiac catheterization by Dr. Katrinka Blazing 6/21.  At that time his Imdur was uptitrated. ? ?He presented to the clinic 10/30/20 for follow-up evaluation stated he had not noticed a difference in his chest discomfort with the addition of metoprolol.  He did not start his Imdur medication.  He reported that with increased physical activity he noticed chest discomfort.  He gave an example of taking out his trash.  When he would rest after increased activity his chest discomfort would ease off and go away.  We reviewed his angiography and he and his wife expressed understanding.  He reported that he generally was fairly sedentary.  He also reported that he did not eat much fiber.  We reviewed his cholesterol panel and he reported that over the last several months he had not watched his diet as closely.  I had him start his Imdur, continue metoprolol, increase his physical activity, start fiber supplementation, and planned follow-up in 6 months.   ? ?He presents to the clinic today for follow-up evaluation and states*** ? ?Today he denies chest pain, shortness of breath, lower extremity edema, fatigue, palpitations, melena, hematuria, hemoptysis, diaphoresis, weakness, presyncope, syncope, orthopnea, and PND. ? ? ? ? ?  Home Medications  ?  ?Prior to Admission medications   ?Medication Sig Start Date End Date Taking? Authorizing Provider  ?ALPRAZolam (XANAX) 0.5 MG tablet TAKE 1 TABLET BY MOUTH 3 TIMES A DAY AS NEEDED FOR ANXIETY. ?Patient taking differently: Take 0.5 mg by mouth 3 (three) times daily as needed for  anxiety. TAKE 1 TABLET BY MOUTH 3 TIMES A DAY AS NEEDED FOR ANXIETY. 06/24/17   Ellyn Hack, MD  ?aspirin EC 81 MG tablet Take 1 tablet (81 mg total) by mouth daily. For heart attack prevention. 11/22/11   Tamala Julian, PA-C  ?buPROPion (WELLBUTRIN XL) 300 MG 24 hr tablet Take 1 tablet (300 mg total) by mouth daily. 06/24/17   Ellyn Hack, MD  ?HYDROcodone-acetaminophen (NORCO/VICODIN) 5-325 MG tablet Take 1 tablet by mouth every 6 (six) hours as needed for moderate pain.  12/29/17   [provider]  ?icosapent Ethyl (VASCEPA) 1 g capsule Take 2 capsules (2 g total) by mouth 2 (two) times daily. 09/23/19   Hilty, Lisette Abu, MD  ?isosorbide mononitrate (IMDUR) 30 MG 24 hr tablet Take 1 tablet (30 mg total) by mouth daily. 10/14/19 10/13/20  Lyn Records, MD  ?lisinopril (PRINIVIL,ZESTRIL) 10 MG tablet Take 1 tablet (10 mg total) by mouth daily. 06/24/17   Ellyn Hack, MD  ?metoprolol succinate (TOPROL XL) 25 MG 24 hr tablet Take 1 tablet (25 mg total) by mouth daily. 10/14/19 10/13/20  Lyn Records, MD  ?Multiple Vitamins-Minerals (MULTIVITAMIN WITH MINERALS) tablet Take 1 tablet by mouth daily.    [provider]  ?omeprazole (PRILOSEC) 20 MG capsule Take 1 capsule (20 mg total) by mouth 2 (two) times daily before a meal. 06/24/17   Ellyn Hack, MD  ?simvastatin (ZOCOR) 40 MG tablet Take 1 tablet (40 mg total) by mouth at bedtime. 06/24/17   Ellyn Hack, MD  ?valACYclovir (VALTREX) 500 MG tablet Take 500 mg by mouth 2 (two) times daily as needed (outbreak).     [provider]  ?vitamin B-12 (CYANOCOBALAMIN) 500 MCG tablet Take 500 mcg by mouth daily.    [provider]  ?zolpidem (AMBIEN) 10 MG tablet Take 1 tablet (10 mg total) by mouth at bedtime. ?Patient taking differently: Take 10 mg by mouth at bedtime as needed for sleep.  06/24/17   Ellyn Hack, MD  ? ? ?Family History  ?  ?Family History  ?Problem Relation Age of Onset  ? Heart attack Mother    ? Hyperlipidemia Mother   ? Hypertension Mother   ? Cancer Father   ?     lung  ? Diabetes Sister   ? Diabetes Brother   ? Asthma Daughter   ? Cancer Maternal Grandmother   ? Stroke Maternal Grandfather   ? Heart attack Paternal Grandfather   ? ?He indicated that his mother is deceased. He indicated that his father is deceased. He indicated that his sister is alive. He indicated that his brother is alive. He indicated that his maternal grandmother is deceased. He indicated that his maternal grandfather is deceased. He indicated that his paternal grandmother is deceased. He indicated that his paternal grandfather is deceased. He indicated that his daughter is alive. ? ? ?Social History  ?  ?Social History  ? ?Socioeconomic History  ? Marital status: Married  ?  Spouse name: Not on file  ? Number of children: Not on file  ? Years of education: Not on  file  ? Highest education level: Not on file  ?Occupational History  ? Not on file  ?Tobacco Use  ? Smoking status: Never  ? Smokeless tobacco: Never  ?Vaping Use  ? Vaping Use: Never used  ?Substance and Sexual Activity  ? Alcohol use: Yes  ?  Alcohol/week: 0.0 standard drinks  ?  Comment: 3 beer a month  ? Drug use: Yes  ?  Frequency: 2.0 times per week  ?  Types: Cocaine  ?  Comment: last use 15-20 years per patient  ? Sexual activity: Yes  ?  Birth control/protection: None  ?Other Topics Concern  ? Not on file  ?Social History Narrative  ? Not on file  ? ?Social Determinants of Health  ? ?Financial Resource Strain: Not on file  ?Food Insecurity: Not on file  ?Transportation Needs: Not on file  ?Physical Activity: Not on file  ?Stress: Not on file  ?Social Connections: Not on file  ?Intimate Partner Violence: Not on file  ?  ? ?Review of Systems  ?  ?General:  No chills, fever, night sweats or weight changes.  ?Cardiovascular:  No chest pain, dyspnea on exertion, edema, orthopnea, palpitations, paroxysmal nocturnal dyspnea. ?Dermatological: No rash,  lesions/masses ?Respiratory: No cough, dyspnea ?Urologic: No hematuria, dysuria ?Abdominal:   No nausea, vomiting, diarrhea, bright red blood per rectum, melena, or hematemesis ?Neurologic:  No visual changes, wkns, changes in ment

## 2021-08-12 ENCOUNTER — Ambulatory Visit: Payer: Medicare Other | Admitting: General Practice

## 2021-09-10 ENCOUNTER — Other Ambulatory Visit: Payer: Self-pay

## 2021-09-10 MED ORDER — OMEGA-3-ACID ETHYL ESTERS 1 G PO CAPS: 1.0000 g | ORAL_CAPSULE | Freq: Two times a day (BID) | ORAL | 1 refills | Status: DC

## 2021-09-10 NOTE — Progress Notes (Signed)
? ?Cardiology Clinic Note  ? ?Patient Name: Billy ChanceKenneth H Stegall Jr. ?Date of Encounter: 09/12/2021 ? ?Primary Care Provider:  Lynnea FerrierKlein, Bert J III, MD ?Primary Cardiologist:  Chrystie NoseKenneth C Hilty, MD ? ?Patient Profile  ?  ?Billy ChanceKenneth H Cahalan Jr. 71 year old male presents the clinic today for follow-up evaluation of his coronary artery disease . ? ?Past Medical History  ?  ?Past Medical History:  ?Diagnosis Date  ? Anxiety   ? Coronary artery disease   ? Depression   ? GERD (gastroesophageal reflux disease)   ? Gout   ? Hypertension   ? ?Past Surgical History:  ?Procedure Laterality Date  ? CARDIAC CATHETERIZATION    ? CARDIAC SURGERY    ? COLONOSCOPY WITH PROPOFOL N/A 05/25/2017  ? Procedure: COLONOSCOPY WITH PROPOFOL;  Surgeon: Wyline MoodAnna, Kiran, MD;  [facility];  Service: Gastroenterology;  Laterality: N/A;  ? CORONARY ARTERY BYPASS GRAFT    ? ESOPHAGOGASTRODUODENOSCOPY N/A 10/27/2017  ? Procedure: ESOPHAGOGASTRODUODENOSCOPY (EGD);  Surgeon: Toney ReilVanga, Rohini Reddy, MD;  [facility];  Service: Gastroenterology;  Laterality: N/A;  ? ESOPHAGOGASTRODUODENOSCOPY (EGD) WITH PROPOFOL N/A 11/19/2017  ? Procedure: ESOPHAGOGASTRODUODENOSCOPY (EGD) WITH PROPOFOL;  Surgeon: Toney ReilVanga, Rohini Reddy, MD;  [facility];  Service: Gastroenterology;  Laterality: N/A;  ? LEFT HEART CATH AND CORS/GRAFTS ANGIOGRAPHY N/A 10/14/2019  ? Procedure: LEFT HEART CATH AND CORS/GRAFTS ANGIOGRAPHY;  Surgeon: Lyn RecordsSmith, Henry W, MD;  [facility];  Service: Cardiovascular;  Laterality: N/A;  ? ? ?Allergies ? ?No Known Allergies ? ?History of Present Illness  ?  ?Billy ChanceKenneth H Philippi Jr. has a PMH of coronary artery disease status post CABG in 2000 (LIMA to LAD, RIMA to RCA, SVG to OM1, diagonal and ramus) underwent cardiac catheterization 10/2019 which showed patent LIMA-LAD, patent RIMA-PDA, total occluded distal left main, totally occluded ostial circumflex, totally occluded ostial LAD and normal LVEDP.  Medical management  was recommended.  His PMH also includes HTN, anxiety, gout, and GERD. ? ?He was seen in follow-up by Dr. Rennis GoldenHilty on 09/23/2019.  During that time he reported difficulty with chest discomfort.  He described exertional type chest pain with left arm heaviness.  His symptoms improved with rest.  It was noted that his LDL cholesterol was 62 but his triglycerides remained elevated at 320.  He underwent cardiac catheterization by Dr. Katrinka BlazingSmith 6/21.  At that time his Imdur was uptitrated. ? ?He presented to the clinic 10/30/20 for follow-up evaluation stated he had not noticed a difference in his chest discomfort with the addition of metoprolol.  He did not start his Imdur medication.  He reported that with increased physical activity he noticed chest discomfort.  He gave an example of taking out his trash.  When he would rest after increased activity his chest discomfort would ease off and go away.  We reviewed his angiography and he and his wife expressed understanding.  He reported that he generally was fairly sedentary.  He also reported that he did not eat much fiber.  We reviewed his cholesterol panel and he reported that over the last several months he had not watched his diet as closely.  I had him start his Imdur, continue metoprolol, increase his physical activity, start fiber supplementation, and planned follow-up in 6 months.   ? ?He presents to the clinic today for follow-up evaluation and states he feels well.  He was unable to come to his last 2 appointments due to his daughter being in a car accident and then having flu.  We reviewed his current medication list and he is tolerating his medications well without side effects.  He does note some atypical chest discomfort at night when he lays down.  However, he denies exertional chest discomfort.  He has been walking on the treadmill daily and going to the [facility] 1 time per week.  He reports that he has been losing some weight.  I will refill his metoprolol, request his  updated lab work from his PCP and plan follow-up for 12 months. ? ?Today he denies chest pain, shortness of breath, lower extremity edema, fatigue, palpitations, melena, hematuria, hemoptysis, diaphoresis, weakness, presyncope, syncope, orthopnea, and PND. ? ? ? ? ?Home Medications  ?  ?Prior to Admission medications   ?Medication Sig Start Date End Date Taking? Authorizing Provider  ?ALPRAZolam (XANAX) 0.5 MG tablet TAKE 1 TABLET BY MOUTH 3 TIMES A DAY AS NEEDED FOR ANXIETY. ?Patient taking differently: Take 0.5 mg by mouth 3 (three) times daily as needed for anxiety. TAKE 1 TABLET BY MOUTH 3 TIMES A DAY AS NEEDED FOR ANXIETY. 06/24/17   Ellyn Hack, MD  ?aspirin EC 81 MG tablet Take 1 tablet (81 mg total) by mouth daily. For heart attack prevention. 11/22/11   Tamala Julian, PA-C  ?buPROPion (WELLBUTRIN XL) 300 MG 24 hr tablet Take 1 tablet (300 mg total) by mouth daily. 06/24/17   Ellyn Hack, MD  ?HYDROcodone-acetaminophen (NORCO/VICODIN) 5-325 MG tablet Take 1 tablet by mouth every 6 (six) hours as needed for moderate pain.  12/29/17   [provider]  ?icosapent Ethyl (VASCEPA) 1 g capsule Take 2 capsules (2 g total) by mouth 2 (two) times daily. 09/23/19   Hilty, Lisette Abu, MD  ?isosorbide mononitrate (IMDUR) 30 MG 24 hr tablet Take 1 tablet (30 mg total) by mouth daily. 10/14/19 10/13/20  Lyn Records, MD  ?lisinopril (PRINIVIL,ZESTRIL) 10 MG tablet Take 1 tablet (10 mg total) by mouth daily. 06/24/17   Ellyn Hack, MD  ?metoprolol succinate (TOPROL XL) 25 MG 24 hr tablet Take 1 tablet (25 mg total) by mouth daily. 10/14/19 10/13/20  Lyn Records, MD  ?Multiple Vitamins-Minerals (MULTIVITAMIN WITH MINERALS) tablet Take 1 tablet by mouth daily.    [provider]  ?omeprazole (PRILOSEC) 20 MG capsule Take 1 capsule (20 mg total) by mouth 2 (two) times daily before a meal. 06/24/17   Ellyn Hack, MD  ?simvastatin (ZOCOR) 40 MG tablet Take 1 tablet (40 mg total) by mouth at  bedtime. 06/24/17   Ellyn Hack, MD  ?valACYclovir (VALTREX) 500 MG tablet Take 500 mg by mouth 2 (two) times daily as needed (outbreak).     [provider]  ?vitamin B-12 (CYANOCOBALAMIN) 500 MCG tablet Take 500 mcg by mouth daily.    [provider]  ?zolpidem (AMBIEN) 10 MG tablet Take 1 tablet (10 mg total) by mouth at bedtime. ?Patient taking differently: Take 10 mg by mouth at bedtime as needed for sleep.  06/24/17   Ellyn Hack, MD  ? ? ?Family History  ?  ?Family History  ?Problem Relation Age of Onset  ? Heart attack Mother   ? Hyperlipidemia Mother   ? Hypertension Mother   ? Cancer Father   ?     lung  ? Diabetes Sister   ? Diabetes Brother   ? Asthma Daughter   ? Cancer Maternal Grandmother   ? Stroke Maternal Grandfather   ? Heart attack  Paternal Grandfather   ? ?He indicated that his mother is deceased. He indicated that his father is deceased. He indicated that his sister is alive. He indicated that his brother is alive. He indicated that his maternal grandmother is deceased. He indicated that his maternal grandfather is deceased. He indicated that his paternal grandmother is deceased. He indicated that his paternal grandfather is deceased. He indicated that his daughter is alive. ? ? ?Social History  ?  ?Social History  ? ?Socioeconomic History  ? Marital status: Married  ?  Spouse name: Not on file  ? Number of children: Not on file  ? Years of education: Not on file  ? Highest education level: Not on file  ?Occupational History  ? Not on file  ?Tobacco Use  ? Smoking status: Never  ? Smokeless tobacco: Never  ?Vaping Use  ? Vaping Use: Never used  ?Substance and Sexual Activity  ? Alcohol use: Yes  ?  Alcohol/week: 0.0 standard drinks  ?  Comment: 3 beer a month  ? Drug use: Yes  ?  Frequency: 2.0 times per week  ?  Types: Cocaine  ?  Comment: last use 15-20 years per patient  ? Sexual activity: Yes  ?  Birth control/protection: None  ?Other Topics Concern  ? Not on  file  ?Social History Narrative  ? Not on file  ? ?Social Determinants of Health  ? ?Financial Resource Strain: Not on file  ?Food Insecurity: Not on file  ?Transportation Needs: Not on file  ?Physical Acti

## 2021-09-12 ENCOUNTER — Encounter: Payer: Self-pay | Admitting: General Practice

## 2021-09-12 ENCOUNTER — Ambulatory Visit: Payer: Medicare Other | Admitting: General Practice

## 2021-09-12 VITALS — BP 118/64 | HR 80 | Ht 65.0 in | Wt 182.2 lb

## 2021-09-12 DIAGNOSIS — E782 Mixed hyperlipidemia: Secondary | ICD-10-CM

## 2021-09-12 DIAGNOSIS — I2510 Atherosclerotic heart disease of native coronary artery without angina pectoris: Secondary | ICD-10-CM

## 2021-09-12 DIAGNOSIS — I10 Essential (primary) hypertension: Secondary | ICD-10-CM | POA: Diagnosis not present

## 2021-09-12 MED ORDER — ROSUVASTATIN CALCIUM 20 MG PO TABS
10.0000 mg | ORAL_TABLET | Freq: Every day | ORAL | 0 refills | Status: DC
Start: 2021-09-12 — End: 2022-10-03

## 2021-09-12 MED ORDER — METOPROLOL SUCCINATE ER 25 MG PO TB24: 25.0000 mg | ORAL_TABLET | Freq: Every day | ORAL | 3 refills | Status: DC

## 2021-09-12 NOTE — Patient Instructions (Signed)
Medication Instructions:  ?Your Physician recommend you continue on your current medication as directed.   ?*If you need a refill on your cardiac medications before your next appointment, please call your pharmacy* ? ?Lab Work: ?Get lab work done at your primary care provider. Have them fax to Coletta Memos, [place] at [phone]. ? ?If you have labs (blood work) drawn today and your tests are completely normal, you will receive your results only by: ?MyChart Message (if you have MyChart) OR ?A paper copy in the mail ?If you have any lab test that is abnormal or we need to change your treatment, we will call you to review the results. ? ?Follow-Up: ?At [facility], you and your health needs are our priority.  As part of our continuing mission to provide you with exceptional heart care, we have created designated Provider Care Teams.  These Care Teams include your primary Cardiologist (physician) and Advanced Practice Providers (APPs -  Physician Assistants and Nurse Practitioners) who all work together to provide you with the care you need, when you need it. ? ?We recommend signing up for the patient portal called "MyChart".  Sign up information is provided on this After Visit Summary.  MyChart is used to connect with patients for Virtual Visits (Telemedicine).  Patients are able to view lab/test results, encounter notes, upcoming appointments, etc.  Non-urgent messages can be sent to your provider as well.   ?To learn more about what you can do with MyChart, go to [email or web address].   ? ?Your next appointment:   ?1 year(s) ? ?The format for your next appointment:   ?In Person ? ?Provider:   ?Pixie Casino, MD  ? ? ?Other Instructions ?Maintain physical activity. ?Heart-Healthy Eating Plan ?Many factors influence your heart (coronary) health, including eating and exercise habits. Coronary risk increases with abnormal blood fat (lipid) levels. Heart-healthy meal planning includes limiting unhealthy fats,  increasing healthy fats, and making other diet and lifestyle changes. ?What is my plan? ?Your health care provider may recommend that you: ?Limit your fat intake to _________% or less of your total calories each day. ?Limit your saturated fat intake to _________% or less of your total calories each day. ?Limit the amount of cholesterol in your diet to less than _________ mg per day. ?What are tips for following this plan? ?Cooking ?Cook foods using methods other than frying. Baking, boiling, grilling, and broiling are all good options. Other ways to reduce fat include: ?Removing the skin from poultry. ?Removing all visible fats from meats. ?Steaming vegetables in water or broth. ?Meal planning ? ?At meals, imagine dividing your plate into fourths: ?Fill one-half of your plate with vegetables and green salads. ?Fill one-fourth of your plate with whole grains. ?Fill one-fourth of your plate with lean protein foods. ?Eat 4-5 servings of vegetables per day. One serving equals 1 cup raw or cooked vegetable, or 2 cups raw leafy greens. ?Eat 4-5 servings of fruit per day. One serving equals 1 medium whole fruit, ? cup dried fruit, ? cup fresh, frozen, or canned fruit, or ? cup 100% fruit juice. ?Eat more foods that contain soluble fiber. Examples include apples, broccoli, carrots, beans, peas, and barley. Aim to get 25-30 g of fiber per day. ?Increase your consumption of legumes, nuts, and seeds to 4-5 servings per week. One serving of dried beans or legumes equals ? cup cooked, 1 serving of nuts is ? cup, and 1 serving of seeds equals 1 tablespoon. ?Fats ?Choose healthy fats more often.  Choose monounsaturated and polyunsaturated fats, such as olive and canola oils, flaxseeds, walnuts, almonds, and seeds. ?Eat more omega-3 fats. Choose salmon, mackerel, sardines, tuna, flaxseed oil, and ground flaxseeds. Aim to eat fish at least 2 times each week. ?Check food labels carefully to identify foods with trans fats or high  amounts of saturated fat. ?Limit saturated fats. These are found in animal products, such as meats, butter, and cream. Plant sources of saturated fats include palm oil, palm kernel oil, and coconut oil. ?Avoid foods with partially hydrogenated oils in them. These contain trans fats. Examples are stick margarine, some tub margarines, cookies, crackers, and other baked goods. ?Avoid fried foods. ?General information ?Eat more home-cooked food and less restaurant, buffet, and fast food. ?Limit or avoid alcohol. ?Limit foods that are high in starch and sugar. ?Lose weight if you are overweight. Losing just 5-10% of your body weight can help your overall health and prevent diseases such as diabetes and heart disease. ?Monitor your salt (sodium) intake, especially if you have high blood pressure. Talk with your health care provider about your sodium intake. ?Try to incorporate more vegetarian meals weekly. ?What foods can I eat? ?Fruits ?All fresh, canned (in natural juice), or frozen fruits. ?Vegetables ?Fresh or frozen vegetables (raw, steamed, roasted, or grilled). Green salads. ?Grains ?Most grains. Choose whole wheat and whole grains most of the time. Rice and pasta, including brown rice and pastas made with whole wheat. ?Meats and other proteins ?Lean, well-trimmed beef, veal, pork, and lamb. Chicken and [place] without skin. All fish and shellfish. Wild duck, rabbit, pheasant, and venison. Egg whites or low-cholesterol egg substitutes. Dried beans, peas, lentils, and tofu. Seeds and most nuts. ?Dairy ?Low-fat or nonfat cheeses, including ricotta and mozzarella. Skim or 1% milk (liquid, powdered, or evaporated). Buttermilk made with low-fat milk. Nonfat or low-fat yogurt. ?Fats and oils ?Non-hydrogenated (trans-free) margarines. Vegetable oils, including soybean, sesame, sunflower, olive, peanut, safflower, corn, canola, and cottonseed. Salad dressings or mayonnaise made with a vegetable oil. ?Beverages ?Water  (mineral or sparkling). Coffee and tea. Diet carbonated beverages. ?Sweets and desserts ?Sherbet, gelatin, and fruit ice. Small amounts of dark chocolate. ?Limit all sweets and desserts. ?Seasonings and condiments ?All seasonings and condiments. ?The items listed above may not be a complete list of foods and beverages you can eat. Contact a dietitian for more options. ?What foods are not recommended? ?Fruits ?Canned fruit in heavy syrup. Fruit in cream or butter sauce. Fried fruit. Limit coconut. ?Vegetables ?Vegetables cooked in cheese, cream, or butter sauce. Fried vegetables. ?Grains ?Breads made with saturated or trans fats, oils, or whole milk. Croissants. Sweet rolls. Donuts. High-fat crackers, such as cheese crackers. ?Meats and other proteins ?Fatty meats, such as hot dogs, ribs, sausage, bacon, rib-eye roast or steak. High-fat deli meats, such as salami and bologna. Caviar. Domestic duck and goose. Organ meats, such as liver. ?Dairy ?Cream, sour cream, cream cheese, and creamed cottage cheese. Whole-milk cheeses. Whole or 2% milk (liquid, evaporated, or condensed). Whole buttermilk. Cream sauce or high-fat cheese sauce. Whole-milk yogurt. ?Fats and oils ?Meat fat, or shortening. Cocoa butter, hydrogenated oils, palm oil, coconut oil, palm kernel oil. Solid fats and shortenings, including bacon fat, salt pork, lard, and butter. Nondairy cream substitutes. Salad dressings with cheese or sour cream. ?Beverages ?Regular sodas and any drinks with added sugar. ?Sweets and desserts ?Frosting. Pudding. Cookies. Cakes. Pies. Milk chocolate or white chocolate. Buttered syrups. Full-fat ice cream or ice cream drinks. ?The items listed above may not  be a complete list of foods and beverages to avoid. Contact a dietitian for more information. ?Summary ?Heart-healthy meal planning includes limiting unhealthy fats, increasing healthy fats, and making other diet and lifestyle changes. ?Lose weight if you are overweight.  Losing just 5-10% of your body weight can help your overall health and prevent diseases such as diabetes and heart disease. ?Focus on eating a balance of foods, including fruits and vegetables, low-fat or nonfat dair

## 2021-11-11 ENCOUNTER — Emergency Department (HOSPITAL_COMMUNITY): Payer: Medicare Other

## 2021-11-11 ENCOUNTER — Telehealth: Payer: Self-pay | Admitting: Internal Medicine

## 2021-11-11 ENCOUNTER — Encounter (HOSPITAL_COMMUNITY)
Admission: EM | Disposition: A | Discharge status: Home / Self Care | Payer: Self-pay | Source: Home / Self Care | Attending: Emergency Medicine

## 2021-11-11 ENCOUNTER — Observation Stay (HOSPITAL_COMMUNITY)
Admission: EM | Admit: 2021-11-11 | Discharge: 2021-11-12 | Disposition: A | Discharge status: Home / Self Care | Payer: Medicare Other | Attending: Internal Medicine | Admitting: Internal Medicine

## 2021-11-11 ENCOUNTER — Encounter (HOSPITAL_COMMUNITY): Payer: Self-pay

## 2021-11-11 DIAGNOSIS — Z79899 Other long term (current) drug therapy: Secondary | ICD-10-CM | POA: Insufficient documentation

## 2021-11-11 DIAGNOSIS — Z955 Presence of coronary angioplasty implant and graft: Secondary | ICD-10-CM | POA: Diagnosis not present

## 2021-11-11 DIAGNOSIS — N183 Chronic kidney disease, stage 3 unspecified: Secondary | ICD-10-CM | POA: Diagnosis not present

## 2021-11-11 DIAGNOSIS — I129 Hypertensive chronic kidney disease with stage 1 through stage 4 chronic kidney disease, or unspecified chronic kidney disease: Secondary | ICD-10-CM | POA: Insufficient documentation

## 2021-11-11 DIAGNOSIS — E785 Hyperlipidemia, unspecified: Secondary | ICD-10-CM | POA: Diagnosis present

## 2021-11-11 DIAGNOSIS — K219 Gastro-esophageal reflux disease without esophagitis: Secondary | ICD-10-CM | POA: Diagnosis not present

## 2021-11-11 DIAGNOSIS — I25110 Atherosclerotic heart disease of native coronary artery with unstable angina pectoris: Secondary | ICD-10-CM | POA: Diagnosis not present

## 2021-11-11 DIAGNOSIS — Z7982 Long term (current) use of aspirin: Secondary | ICD-10-CM | POA: Diagnosis not present

## 2021-11-11 DIAGNOSIS — I10 Essential (primary) hypertension: Secondary | ICD-10-CM | POA: Diagnosis present

## 2021-11-11 DIAGNOSIS — I200 Unstable angina: Principal | ICD-10-CM | POA: Diagnosis present

## 2021-11-11 DIAGNOSIS — Z951 Presence of aortocoronary bypass graft: Secondary | ICD-10-CM | POA: Diagnosis not present

## 2021-11-11 DIAGNOSIS — R079 Chest pain, unspecified: Secondary | ICD-10-CM | POA: Diagnosis present

## 2021-11-11 HISTORY — DX: Unstable angina: I20.0

## 2021-11-11 HISTORY — PX: LEFT HEART CATH AND CORS/GRAFTS ANGIOGRAPHY: CATH118250

## 2021-11-11 LAB — COMPREHENSIVE METABOLIC PANEL
ALT: 26 U/L (ref 0–44)
AST: 25 U/L (ref 15–41)
Albumin: 3.7 g/dL (ref 3.5–5.0)
Alkaline Phosphatase: 83 U/L (ref 38–126)
Anion gap: 10 (ref 5–15)
BUN: 20 mg/dL (ref 8–23)
CO2: 24 mmol/L (ref 22–32)
Calcium: 9.6 mg/dL (ref 8.9–10.3)
Chloride: 105 mmol/L (ref 98–111)
Creatinine, Ser: 1.55 mg/dL — ABNORMAL HIGH (ref 0.61–1.24)
GFR, Estimated: 48 mL/min — ABNORMAL LOW (ref >60)
Glucose, Bld: 113 mg/dL — ABNORMAL HIGH (ref 70–99)
Potassium: 4.9 mmol/L (ref 3.5–5.1)
Sodium: 139 mmol/L (ref 135–145)
Total Bilirubin: 0.7 mg/dL (ref 0.3–1.2)
Total Protein: 6.3 g/dL — ABNORMAL LOW (ref 6.5–8.1)

## 2021-11-11 LAB — TROPONIN I (HIGH SENSITIVITY)
Troponin I (High Sensitivity): 6 ng/L (ref <18)
Troponin I (High Sensitivity): 13 ng/L (ref <18)

## 2021-11-11 LAB — CBC
HCT: 44.1 % (ref 39.0–52.0)
HCT: 43 % (ref 39.0–52.0)
Hemoglobin: 14.8 g/dL (ref 13.0–17.0)
Hemoglobin: 14.9 g/dL (ref 13.0–17.0)
MCH: 30.6 pg (ref 26.0–34.0)
MCH: 31.3 pg (ref 26.0–34.0)
MCHC: 33.6 g/dL (ref 30.0–36.0)
MCHC: 34.7 g/dL (ref 30.0–36.0)
MCV: 91.3 fL (ref 80.0–100.0)
MCV: 90.3 fL (ref 80.0–100.0)
Platelets: 245 10*3/uL (ref 150–400)
Platelets: 244 10*3/uL (ref 150–400)
RBC: 4.83 MIL/uL (ref 4.22–5.81)
RBC: 4.76 MIL/uL (ref 4.22–5.81)
RDW: 12.6 % (ref 11.5–15.5)
RDW: 12.6 % (ref 11.5–15.5)
WBC: 7 10*3/uL (ref 4.0–10.5)
WBC: 6.5 10*3/uL (ref 4.0–10.5)
nRBC: 0 % (ref 0.0–0.2)
nRBC: 0 % (ref 0.0–0.2)

## 2021-11-11 LAB — I-STAT CHEM 8, ED
BUN: 22 mg/dL (ref 8–23)
Calcium, Ion: 1.08 mmol/L — ABNORMAL LOW (ref 1.15–1.40)
Chloride: 106 mmol/L (ref 98–111)
Creatinine, Ser: 1.7 mg/dL — ABNORMAL HIGH (ref 0.61–1.24)
Glucose, Bld: 109 mg/dL — ABNORMAL HIGH (ref 70–99)
HCT: 42 % (ref 39.0–52.0)
Hemoglobin: 14.3 g/dL (ref 13.0–17.0)
Potassium: 4.8 mmol/L (ref 3.5–5.1)
Sodium: 139 mmol/L (ref 135–145)
TCO2: 24 mmol/L (ref 22–32)

## 2021-11-11 SURGERY — LEFT HEART CATH AND CORS/GRAFTS ANGIOGRAPHY
Anesthesia: LOCAL

## 2021-11-11 MED ORDER — NITROGLYCERIN 0.4 MG SL SUBL
SUBLINGUAL_TABLET | SUBLINGUAL | Status: AC
  Filled 2021-11-11: qty 1

## 2021-11-11 MED ORDER — HEPARIN (PORCINE) IN NACL 1000-0.9 UT/500ML-% IV SOLN
INTRAVENOUS | Status: AC
  Filled 2021-11-11: qty 1000

## 2021-11-11 MED ORDER — SODIUM CHLORIDE 0.9% FLUSH: 3.0000 mL | Freq: Two times a day (BID) | INTRAVENOUS | Status: DC

## 2021-11-11 MED ORDER — FENTANYL CITRATE (PF) 100 MCG/2ML IJ SOLN
INTRAMUSCULAR | Status: DC | PRN
  Administered 2021-11-11 (×3): 25 ug via INTRAVENOUS

## 2021-11-11 MED ORDER — SODIUM CHLORIDE 0.9% FLUSH
3.0000 mL | Freq: Two times a day (BID) | INTRAVENOUS | Status: DC
  Administered 2021-11-12: 3 mL via INTRAVENOUS

## 2021-11-11 MED ORDER — ZOLPIDEM TARTRATE 5 MG PO TABS: 10.0000 mg | ORAL_TABLET | Freq: Every evening | ORAL | Status: DC | PRN

## 2021-11-11 MED ORDER — LIDOCAINE HCL (PF) 1 % IJ SOLN
INTRAMUSCULAR | Status: AC
  Filled 2021-11-11: qty 30

## 2021-11-11 MED ORDER — SODIUM CHLORIDE 0.9 % IV SOLN: INTRAVENOUS | Status: AC

## 2021-11-11 MED ORDER — OMEGA-3-ACID ETHYL ESTERS 1 G PO CAPS
1.0000 g | ORAL_CAPSULE | Freq: Two times a day (BID) | ORAL | Status: DC
  Administered 2021-11-12: 1 g via ORAL
  Filled 2021-11-11: qty 1

## 2021-11-11 MED ORDER — ASPIRIN 81 MG PO CHEW: 81.0000 mg | CHEWABLE_TABLET | ORAL | Status: DC

## 2021-11-11 MED ORDER — ISOSORBIDE MONONITRATE ER 30 MG PO TB24
30.0000 mg | ORAL_TABLET | Freq: Every day | ORAL | Status: DC
  Administered 2021-11-11 – 2021-11-12 (×2): 30 mg via ORAL
  Filled 2021-11-11 (×2): qty 1

## 2021-11-11 MED ORDER — SODIUM CHLORIDE 0.9 % IV SOLN: 250.0000 mL | INTRAVENOUS | Status: DC | PRN

## 2021-11-11 MED ORDER — SODIUM CHLORIDE 0.9 % IV SOLN: INTRAVENOUS | Status: DC

## 2021-11-11 MED ORDER — MIDAZOLAM HCL 2 MG/2ML IJ SOLN
INTRAMUSCULAR | Status: DC | PRN
  Administered 2021-11-11 (×2): 1 mg via INTRAVENOUS

## 2021-11-11 MED ORDER — ZOLPIDEM TARTRATE 5 MG PO TABS
5.0000 mg | ORAL_TABLET | Freq: Every evening | ORAL | Status: DC | PRN
  Administered 2021-11-12: 5 mg via ORAL
  Filled 2021-11-11: qty 1

## 2021-11-11 MED ORDER — ROSUVASTATIN CALCIUM 5 MG PO TABS
10.0000 mg | ORAL_TABLET | Freq: Every day | ORAL | Status: DC
Start: 2021-11-11 — End: 2021-11-12
  Administered 2021-11-11: 10 mg via ORAL
  Filled 2021-11-11: qty 2

## 2021-11-11 MED ORDER — NITROGLYCERIN 0.4 MG SL SUBL
SUBLINGUAL_TABLET | SUBLINGUAL | Status: DC | PRN
  Administered 2021-11-11: .4 mg via SUBLINGUAL

## 2021-11-11 MED ORDER — ACETAMINOPHEN 325 MG PO TABS
650.0000 mg | ORAL_TABLET | ORAL | Status: DC | PRN
  Administered 2021-11-12: 650 mg via ORAL
  Filled 2021-11-11: qty 2

## 2021-11-11 MED ORDER — ONDANSETRON HCL 4 MG/2ML IJ SOLN
4.0000 mg | Freq: Once | INTRAMUSCULAR | Status: AC
  Administered 2021-11-11: 4 mg via INTRAVENOUS
  Filled 2021-11-11: qty 2

## 2021-11-11 MED ORDER — PANTOPRAZOLE SODIUM 40 MG PO TBEC
40.0000 mg | DELAYED_RELEASE_TABLET | Freq: Every day | ORAL | Status: DC
Start: 2021-11-12 — End: 2021-11-12
  Administered 2021-11-12: 40 mg via ORAL
  Filled 2021-11-11: qty 1

## 2021-11-11 MED ORDER — IOHEXOL 350 MG/ML SOLN
INTRAVENOUS | Status: DC | PRN
  Administered 2021-11-11: 55 mL

## 2021-11-11 MED ORDER — ASPIRIN 81 MG PO TBEC
81.0000 mg | DELAYED_RELEASE_TABLET | Freq: Every day | ORAL | Status: DC
  Administered 2021-11-12: 81 mg via ORAL
  Filled 2021-11-11: qty 1

## 2021-11-11 MED ORDER — SODIUM CHLORIDE 0.9% FLUSH: 3.0000 mL | INTRAVENOUS | Status: DC | PRN

## 2021-11-11 MED ORDER — ENOXAPARIN SODIUM 40 MG/0.4ML IJ SOSY
40.0000 mg | PREFILLED_SYRINGE | INTRAMUSCULAR | Status: DC
  Administered 2021-11-12: 40 mg via SUBCUTANEOUS
  Filled 2021-11-11: qty 0.4

## 2021-11-11 MED ORDER — ADULT MULTIVITAMIN W/MINERALS CH
1.0000 | ORAL_TABLET | Freq: Every day | ORAL | Status: DC
  Administered 2021-11-12: 1 via ORAL
  Filled 2021-11-11 (×2): qty 1

## 2021-11-11 MED ORDER — LISINOPRIL 10 MG PO TABS
10.0000 mg | ORAL_TABLET | Freq: Every day | ORAL | Status: DC
  Administered 2021-11-12: 10 mg via ORAL
  Filled 2021-11-11: qty 1

## 2021-11-11 MED ORDER — HEPARIN (PORCINE) IN NACL 1000-0.9 UT/500ML-% IV SOLN
INTRAVENOUS | Status: DC | PRN
  Administered 2021-11-11 (×2): 500 mL

## 2021-11-11 MED ORDER — ALPRAZOLAM 0.5 MG PO TABS: 0.5000 mg | ORAL_TABLET | Freq: Three times a day (TID) | ORAL | Status: DC | PRN

## 2021-11-11 MED ORDER — BUPROPION HCL ER (XL) 150 MG PO TB24
300.0000 mg | ORAL_TABLET | Freq: Every day | ORAL | Status: DC
  Administered 2021-11-12: 300 mg via ORAL
  Filled 2021-11-11: qty 2

## 2021-11-11 MED ORDER — ENOXAPARIN SODIUM 40 MG/0.4ML IJ SOSY
40.0000 mg | PREFILLED_SYRINGE | INTRAMUSCULAR | Status: DC
Start: 2021-11-12 — End: 2021-11-11

## 2021-11-11 MED ORDER — HYDRALAZINE HCL 20 MG/ML IJ SOLN: 10.0000 mg | INTRAMUSCULAR | Status: AC | PRN

## 2021-11-11 MED ORDER — MIDAZOLAM HCL 2 MG/2ML IJ SOLN
INTRAMUSCULAR | Status: AC
  Filled 2021-11-11: qty 2

## 2021-11-11 MED ORDER — LABETALOL HCL 5 MG/ML IV SOLN: 10.0000 mg | INTRAVENOUS | Status: AC | PRN

## 2021-11-11 MED ORDER — ONDANSETRON HCL 4 MG/2ML IJ SOLN: 4.0000 mg | Freq: Four times a day (QID) | INTRAMUSCULAR | Status: DC | PRN

## 2021-11-11 MED ORDER — METOPROLOL SUCCINATE ER 25 MG PO TB24
25.0000 mg | ORAL_TABLET | Freq: Every day | ORAL | Status: DC
  Administered 2021-11-12: 25 mg via ORAL
  Filled 2021-11-11: qty 1

## 2021-11-11 MED ORDER — FENTANYL CITRATE (PF) 100 MCG/2ML IJ SOLN
INTRAMUSCULAR | Status: AC
  Filled 2021-11-11: qty 2

## 2021-11-11 MED ORDER — MORPHINE SULFATE (PF) 4 MG/ML IV SOLN
4.0000 mg | Freq: Once | INTRAVENOUS | Status: AC
  Administered 2021-11-11: 4 mg via INTRAVENOUS
  Filled 2021-11-11: qty 1

## 2021-11-11 MED ORDER — COLCHICINE 0.6 MG PO TABS
0.6000 mg | ORAL_TABLET | Freq: Every day | ORAL | Status: DC
  Administered 2021-11-11 – 2021-11-12 (×2): 0.6 mg via ORAL
  Filled 2021-11-11 (×2): qty 1

## 2021-11-11 MED ORDER — LIDOCAINE HCL (PF) 1 % IJ SOLN
INTRAMUSCULAR | Status: DC | PRN
  Administered 2021-11-11: 20 mL

## 2021-11-11 SURGICAL SUPPLY — 12 items
CATH INFINITI 5 FR IM (CATHETERS) ×1 IMPLANT
CATH INFINITI 5FR MULTPACK ANG (CATHETERS) ×1 IMPLANT
DEVICE CLOSURE MYNXGRIP 5F (Vascular Products) ×1 IMPLANT
GUIDEWIRE ANGLED .035X150CM (WIRE) ×1 IMPLANT
KIT HEART LEFT (KITS) ×2 IMPLANT
KIT MICROPUNCTURE NIT STIFF (SHEATH) ×1 IMPLANT
PACK CARDIAC CATHETERIZATION (CUSTOM PROCEDURE TRAY) ×2 IMPLANT
SHEATH PINNACLE 5F 10CM (SHEATH) ×1 IMPLANT
SHEATH PROBE COVER 6X72 (BAG) ×1 IMPLANT
TRANSDUCER W/STOPCOCK (MISCELLANEOUS) ×2 IMPLANT
TUBING CIL FLEX 10 FLL-RA (TUBING) ×2 IMPLANT
WIRE EMERALD 3MM-J .035X150CM (WIRE) ×1 IMPLANT

## 2021-11-11 NOTE — ED Notes (Signed)
Repeat EKG done at this time. Provider notified of results.

## 2021-11-11 NOTE — ED Triage Notes (Signed)
Chest pain that started last night radiating to left arm. Hx of 5 stents. Alert and oriented x 4. 324mg  Aspirin and 1 nitro SL given by EMS.

## 2021-11-11 NOTE — H&P (Addendum)
Cardiology Consultation:   Patient ID: Billy Cisneros. MRN: [id number]; DOB: 06-25-1950  Admit date: 11/11/2021 Date of Consult: 11/11/2021  PCP:  Lynnea Ferrier, MD   [facility] Providers Cardiologist:  Chrystie Nose, MD       CC: chest pain  Patient Profile:   Billy Cisneros. is a 71 y.o. male with a hx of CAD with CABG 2000 and last cath 2021 with medical therapy also with HTN, anxiety and gout, GERD who is being seen 11/11/2021 for the evaluation of chest pain at the request of Dr. Estell Harpin.  History of Present Illness:   Mr. Sidor with hx coronary artery disease status post CABG in 2000 (LIMA to LAD, RIMA to RCA, SVG to OM1, diagonal and ramus) underwent cardiac catheterization 10/2019 which showed patent LIMA-LAD, patent RIMA-PDA, total occluded distal left main, totally occluded ostial circumflex, totally occluded ostial LAD and normal LVEDP.  Medical management was recommended.  His PMH also includes HTN, anxiety, gout, and GERD.  Pt presented to ER today after developing chest pain during the night woke him from sleep but he was able to return to sleep without NTG.  Today has been having chest pain every 30 min or so but did not take NTG until EMS, though the NTG relieved the pain.  No pain currently.  EKG abnormal.  Troponin only 6.   EKG:  The EKG was personally reviewed and demonstrates:  SR with minimal ST elevation Telemetry:  Telemetry was personally reviewed and demonstrates:  SR   Na 139, K+ 4.8 BUN 1.70  Hgb 14.8 plts 245 WBC 7.0  FINDINGS: Prior CABG. Heart size within normal limits. Chronic right middle lobe scarring. The lungs appear otherwise clear. No blunting of the costophrenic angles. Heart size within normal limits. Atherosclerotic calcification of the aortic arch.   IMPRESSION: 1.  Aortic Atherosclerosis (ICD10-I70.0).  Prior CABG. 2. Stable right middle lobe scarring.   BP 103/64 P 63 R 16   Past Medical History:  Diagnosis  Date   Anxiety    Coronary artery disease    Depression    GERD (gastroesophageal reflux disease)    Gout    Hypertension     Past Surgical History:  Procedure Laterality Date   CARDIAC CATHETERIZATION     CARDIAC SURGERY     COLONOSCOPY WITH PROPOFOL N/A 05/25/2017   Procedure: COLONOSCOPY WITH PROPOFOL;  Surgeon: Wyline Mood, MD;  Location: [facility] ENDOSCOPY;  Service: Gastroenterology;  Laterality: N/A;   CORONARY ARTERY BYPASS GRAFT     ESOPHAGOGASTRODUODENOSCOPY N/A 10/27/2017   Procedure: ESOPHAGOGASTRODUODENOSCOPY (EGD);  Surgeon: Toney Reil, MD;  Location: [facility] ENDOSCOPY;  Service: Gastroenterology;  Laterality: N/A;   ESOPHAGOGASTRODUODENOSCOPY (EGD) WITH PROPOFOL N/A 11/19/2017   Procedure: ESOPHAGOGASTRODUODENOSCOPY (EGD) WITH PROPOFOL;  Surgeon: Toney Reil, MD;  Location: [facility] ENDOSCOPY;  Service: Gastroenterology;  Laterality: N/A;   LEFT HEART CATH AND CORS/GRAFTS ANGIOGRAPHY N/A 10/14/2019   Procedure: LEFT HEART CATH AND CORS/GRAFTS ANGIOGRAPHY;  Surgeon: Lyn Records, MD;  Location: [facility] INVASIVE CV LAB;  Service: Cardiovascular;  Laterality: N/A;       Inpatient Medications: Scheduled Meds:  Continuous Infusions:  PRN Meds:   Allergies:   No Known Allergies  Social History:   Social History   Socioeconomic History   Marital status: Married    Spouse name: Not on file   Number of children: Not on file   Years of education: Not on file   Highest education  level: Not on file  Occupational History   Not on file  Tobacco Use   Smoking status: Never   Smokeless tobacco: Never  Vaping Use   Vaping Use: Never used  Substance and Sexual Activity   Alcohol use: Yes    Alcohol/week: 0.0 standard drinks of alcohol    Comment: 3 beer a month   Drug use: Yes    Frequency: 2.0 times per week    Types: Cocaine    Comment: last use 15-20 years per patient   Sexual activity: Yes    Birth control/protection: None  Other Topics Concern   Not on  file  Social History Narrative   Not on file   Social Determinants of Health   Financial Resource Strain: Not on file  Food Insecurity: Not on file  Transportation Needs: Not on file  Physical Activity: Not on file  Stress: Not on file  Social Connections: Not on file  Intimate Partner Violence: Not on file    Family History:    Family History  Problem Relation Age of Onset   Heart attack Mother    Hyperlipidemia Mother    Hypertension Mother    Cancer Father        lung   Diabetes Sister    Diabetes Brother    Asthma Daughter    Cancer Maternal Grandmother    Stroke Maternal Grandfather    Heart attack Paternal Grandfather      ROS:  Please see the history of present illness.  General:no colds or fevers, no weight changes Skin:no rashes or ulcers HEENT:no blurred vision, no congestion CV:see HPI PUL:see HPI GI:no diarrhea constipation or melena, no indigestion GU:no hematuria, no dysuria MS:no joint pain, no claudication Neuro:no syncope, no lightheadedness Endo:no diabetes, no thyroid disease  All other ROS reviewed and negative.     Physical Exam/Data:   Vitals:   11/11/21 1336 11/11/21 1337 11/11/21 1345  BP: 124/74  118/78  Pulse: 64  63  Resp: 15  16  Temp: 97.6 F (36.4 C)    TempSrc: Oral    SpO2: 97%  95%  Weight:  77.6 kg   Height:  5\' 5"  (1.651 m)    No intake or output data in the 24 hours ending 11/11/21 1422    11/11/2021    1:37 PM 09/12/2021   11:30 AM 10/30/2020   11:57 AM  Last 3 Weights  Weight (lbs) 171 lb 182 lb 3.2 oz 169 lb 6.4 oz  Weight (kg) 77.565 kg 82.645 kg 76.839 kg     Body mass index is 28.46 kg/m.  General:  Well nourished, well developed, in no acute distress HEENT: normal Neck: no JVD Vascular: No carotid bruits; Distal pulses 2+ bilaterally Cardiac:  normal S1, S2; RRR; no murmur gallup rub or click Lungs:  clear to auscultation bilaterally, no wheezing, rhonchi or rales  Abd: soft, nontender, no hepatomegaly   Ext: no edema Musculoskeletal:  No deformities, BUE and BLE strength normal and equal Skin: warm and dry  Neuro:  CNs 2-12 intact, no focal abnormalities noted Psych:  Normal affect   Relevant CV Studies: Cardiac cath 10/14/19  Patent saphenous vein graft to the ramus intermedius but occlusion occlusion of the saphenous vein graft limb to the obtuse marginal.   Patent LIMA to distal LAD Patent RIMA to PDA Totally occluded distal left main Totally occluded ostial circumflex Totally occluded ostial LAD Normal LV end-diastolic pressure.   RECOMMENDATIONS:   Toprol-XL 25 mg/day After  1 week if angina is still significant, add Imdur 30 mg/day. Further titrate anti-ischemic therapy until relief of symptoms. No interventional targets.  Diagnostic Dominance: Right    Laboratory Data:  High Sensitivity Troponin:  No results for input(s): "TROPONINIHS" in the last 720 hours.   Chemistry Recent Labs  Lab 11/11/21 1405  NA 139  K 4.8  CL 106  GLUCOSE 109*  BUN 22  CREATININE 1.70*    No results for input(s): "PROT", "ALBUMIN", "AST", "ALT", "ALKPHOS", "BILITOT" in the last 168 hours. Lipids No results for input(s): "CHOL", "TRIG", "HDL", "LABVLDL", "LDLCALC", "CHOLHDL" in the last 168 hours.  Hematology Recent Labs  Lab 11/11/21 1347 11/11/21 1405  WBC 7.0  --   RBC 4.83  --   HGB 14.8 14.3  HCT 44.1 42.0  MCV 91.3  --   MCH 30.6  --   MCHC 33.6  --   RDW 12.6  --   PLT 245  --    Thyroid No results for input(s): "TSH", "FREET4" in the last 168 hours.  BNPNo results for input(s): "BNP", "PROBNP" in the last 168 hours.  DDimer No results for input(s): "DDIMER" in the last 168 hours.   Radiology/Studies:  DG Chest 2 View  Result Date: 11/11/2021 CLINICAL DATA:  Chest pain radiating into the left arm. Shortness of breath. Symptoms started last night. EXAM: CHEST - 2 VIEW COMPARISON:  11/23/2017 FINDINGS: Prior CABG. Heart size within normal limits. Chronic right  middle lobe scarring. The lungs appear otherwise clear. No blunting of the costophrenic angles. Heart size within normal limits. Atherosclerotic calcification of the aortic arch. IMPRESSION: 1.  Aortic Atherosclerosis (ICD10-I70.0).  Prior CABG. 2. Stable right middle lobe scarring. Electronically Signed   By: Gaylyn Rong M.D.   On: 11/11/2021 14:18     Assessment and Plan:   Unstable angina with abnormal EKG but neg troponins - his chest pain returns while at rest.  Second EKG without pain improved.  Plan for cardiac cath with his hx of CABG  HTN controlled to low with sl NTG  continue meds  HLD continue statins    Risk Assessment/Risk Scores:     TIMI Risk Score for Unstable Angina or Non-ST Elevation MI:   The patient's TIMI risk score is  , which indicates a  % risk of all cause mortality, new or recurrent myocardial infarction or need for urgent revascularization in the next 14 days.       For questions or updates, please contact [facility] Please consult [email or web address] for contact info under    Signed, Nada Boozer, NP  11/11/2021 2:22 PM   Personally seen and examined. Agree with APP above with the following comments:  Briefly 71 yo M with a history of CAD s/p CABG (LIMA to LAD patent 21, RIMA to RCA patent 21, SVG to OM1 occluded, diagonal and ramus) who has been without his IMDUR for 5 days with unstable angina  Patient notes that in 2000 he had exertion fatigue and diaphoresis. Was doing well despite ~5 days of imdur because of miscommunications with [facility] or with [facility].  Yesterday had radiating chest pain that woke him from sleep.  It felt worse than prior to his CABG.   No SOB no Palpitations No syncope. Improved on exam with the advent of nitroglycerin.  Has also been given morphine. Despite this he has had chest pain that worsens just walking to the ED restroom.  Exam notable for L radial +2, R femoral +2,  mild distress on exam otherwise as above.  Labs  notable for troponin 6 EKG SR with inferior ST elevations, resolved by 1500 EKG Tele: SR Personally reviewed relevant tests; Creatinine 1.7 which appears to be his baseline HS troponin 6, pending second lab test   Would recommend  - s/p ASA load, continue asa, increase imdur to 60 mg continue rosuvastatin - will get echo - given his recurrent pain, will give IVF; we would ideally like to LHC today, if we are unable to cath today would cath 11/13/21 unless need for urgent cath - lovenox ppx post cath - full code - labs ordered from tomorrow - if we are not able to cath today cardiac diet  Riley Lam, MD FASE Cardiologist [facility]  [address] [place], [address] [place], [place] [address] [phone]  4:11 PM  -

## 2021-11-11 NOTE — ED Notes (Signed)
Got patient into a gown on the monitor did ekg shown to Dr Zammit patient is resting with call bell in reach  

## 2021-11-11 NOTE — Telephone Encounter (Signed)
*  STAT* If patient is at the pharmacy, call can be transferred to refill team.   1. Which medications need to be refilled? (please list name of each medication and dose if known) isosorbide mononitrate (IMDUR) 30 MG 24 hr tablet  2. Which pharmacy/location (including street and city if local pharmacy) is medication to be sent to? [facility] [address] - [place], [place] - [address]  3. Do they need a 30 day or 90 day supply? 90 day

## 2021-11-11 NOTE — Interval H&P Note (Signed)
History and Physical Interval Note:  11/11/2021 5:33 PM  Billy Cisneros.  has presented today for surgery, with the diagnosis of unstable angina.  The various methods of treatment have been discussed with the patient and family. After consideration of risks, benefits and other options for treatment, the patient has consented to  Procedure(s): LEFT HEART CATH AND CORS/GRAFTS ANGIOGRAPHY (N/A) as a surgical intervention.  The patient's history has been reviewed, patient examined, no change in status, stable for surgery.  I have reviewed the patient's chart and labs.  Questions were answered to the patient's satisfaction.    Cath Lab Visit (complete for each Cath Lab visit)  Clinical Evaluation Leading to the Procedure:   ACS: Yes.    Non-ACS:  N/A  [name]

## 2021-11-11 NOTE — ED Notes (Signed)
Taken to cath lab by cath lab tech.  

## 2021-11-11 NOTE — ED Provider Notes (Signed)
[facility] EMERGENCY DEPARTMENT Provider Note   CSN: [id number] Arrival date & time: 11/11/21  1327     History {Add pertinent medical, surgical, social history, OB history to HPI:1} Chief Complaint  Patient presents with  . Chest Pain    Billy Cisneros. is a 71 y.o. male with Hx of HTN, prior CABG (2000), dyslipidemia with chief complaint of chest pain today.  Began as discomfort randomly this morning around 1 AM, then progressed to chest pain causing him to come into the ED.  Chest pain described as sharp, initially localized to the left middle chest, then radiated down his left arm, with some radiation into left neck and left upper back.  Took 324mg  ASA and given 1 sublingual nitro via EMS prior to arrival.  Patient notes improvement after receiving the nitro.  Takes metoprolol for blood pressure, which he took this morning.  Takes Imdur daily, but ran out of it 3 days ago.  Also recently started working at an [employer], but denies pain when moving his arms around.  Denies N/V, vision changes, headache.  Endorses some dark bowel movements over the past 3 weeks, did not think much of it.  Additional Hx of CKD stage III, GERD, gout, anxiety, depression  The history is provided by the patient and medical records.  Chest Pain      Home Medications Prior to Admission medications   Medication Sig Start Date End Date Taking? Authorizing Provider  allopurinol (ZYLOPRIM) 100 MG tablet Take 100 mg by mouth as needed. 08/25/21   [provider]  ALPRAZolam (XANAX) 0.5 MG tablet TAKE 1 TABLET BY MOUTH 3 TIMES A DAY AS NEEDED FOR ANXIETY. Patient taking differently: Take 0.5 mg by mouth 3 (three) times daily as needed for anxiety. TAKE 1 TABLET BY MOUTH 3 TIMES A DAY AS NEEDED FOR ANXIETY. 06/24/17   06/26/17, MD  aspirin EC 81 MG tablet Take 1 tablet (81 mg total) by mouth daily. For heart attack prevention. 11/22/11   11/24/11, PA-C   buPROPion (WELLBUTRIN XL) 300 MG 24 hr tablet Take 1 tablet (300 mg total) by mouth daily. 06/24/17   06/26/17, MD  colchicine 0.6 MG tablet Take 0.6 mg by mouth daily. 08/24/21   [provider]  HYDROcodone-acetaminophen (NORCO/VICODIN) 5-325 MG tablet Take 1 tablet by mouth every 6 (six) hours as needed for moderate pain.  Patient not taking: Reported on 09/12/2021 12/29/17   [provider]  isosorbide mononitrate (IMDUR) 30 MG 24 hr tablet Take 1 tablet (30 mg total) by mouth daily. 10/30/20 10/30/21  11/01/21, NP  lisinopril (PRINIVIL,ZESTRIL) 10 MG tablet Take 1 tablet (10 mg total) by mouth daily. 06/24/17   06/26/17, MD  metoprolol succinate (TOPROL XL) 25 MG 24 hr tablet Take 1 tablet (25 mg total) by mouth daily for 365 doses. 09/12/21 09/12/22  11/12/22, NP  Multiple Vitamins-Minerals (MULTIVITAMIN WITH MINERALS) tablet Take 1 tablet by mouth daily.    [provider]  omega-3 acid ethyl esters (LOVAZA) 1 g capsule Take 1 capsule (1 g total) by mouth 2 (two) times daily. 09/10/21   Hilty, 11/10/21, MD  omeprazole (PRILOSEC) 20 MG capsule Take 1 capsule (20 mg total) by mouth 2 (two) times daily before a meal. 06/24/17   06/26/17, MD  predniSONE (DELTASONE) 10 MG tablet Take by mouth as needed. 07/04/21  [provider]  rosuvastatin (CRESTOR) 20 MG tablet Take 0.5 tablets (10 mg total) by mouth at bedtime. 09/12/21   Ronney Asters, NP  valACYclovir (VALTREX) 500 MG tablet Take 500 mg by mouth 2 (two) times daily as needed (outbreak).     [provider]  zolpidem (AMBIEN) 10 MG tablet Take 1 tablet (10 mg total) by mouth at bedtime. Patient taking differently: Take 10 mg by mouth at bedtime as needed for sleep. 06/24/17   Ellyn Hack, MD      Allergies    Patient has no known allergies.    Review of Systems   Review of Systems  Cardiovascular:  Positive for chest pain.    Physical Exam Updated  Vital Signs BP 103/64   Pulse 63   Temp 97.6 F (36.4 C) (Oral)   Resp 16   Ht 5\' 5"  (1.651 m)   Wt 77.6 kg   SpO2 95%   BMI 28.46 kg/m  Physical Exam Vitals and nursing note reviewed.  Constitutional:      General: He is not in acute distress.    Appearance: He is well-developed. He is not ill-appearing, toxic-appearing or diaphoretic.  HENT:     Head: Normocephalic and atraumatic.  Eyes:     Conjunctiva/sclera: Conjunctivae normal.  Cardiovascular:     Rate and Rhythm: Normal rate and regular rhythm.     Pulses:          Radial pulses are 2+ on the right side and 2+ on the left side.     Heart sounds: Normal heart sounds. No murmur heard. Pulmonary:     Effort: Pulmonary effort is normal. No tachypnea, accessory muscle usage or respiratory distress.     Breath sounds: Normal breath sounds.  Chest:     Chest wall: Tenderness (Mild mid-sternal tenderness) present. No mass.  Abdominal:     Palpations: Abdomen is soft.     Tenderness: There is no abdominal tenderness. There is no guarding.  Musculoskeletal:        General: No swelling.     Cervical back: Neck supple.     Right lower leg: No tenderness. No edema.     Left lower leg: No tenderness. No edema.  Skin:    General: Skin is warm and dry.     Capillary Refill: Capillary refill takes less than 2 seconds.  Neurological:     Mental Status: He is alert and oriented to person, place, and time.  Psychiatric:        Mood and Affect: Mood normal.     ED Results / Procedures / Treatments   Labs (all labs ordered are listed, but only abnormal results are displayed) Labs Reviewed  COMPREHENSIVE METABOLIC PANEL - Abnormal; Notable for the following components:      Result Value   Glucose, Bld 113 (*)    Creatinine, Ser 1.55 (*)    Total Protein 6.3 (*)    GFR, Estimated 48 (*)    All other components within normal limits  I-STAT CHEM 8, ED - Abnormal; Notable for the following components:   Creatinine, Ser 1.70  (*)    Glucose, Bld 109 (*)    Calcium, Ion 1.08 (*)    All other components within normal limits  CBC  TROPONIN I (HIGH SENSITIVITY)  TROPONIN I (HIGH SENSITIVITY)    EKG None  Radiology DG Chest 2 View  Result Date: 11/11/2021 CLINICAL DATA:  Chest pain radiating into the left  arm. Shortness of breath. Symptoms started last night. EXAM: CHEST - 2 VIEW COMPARISON:  11/23/2017 FINDINGS: Prior CABG. Heart size within normal limits. Chronic right middle lobe scarring. The lungs appear otherwise clear. No blunting of the costophrenic angles. Heart size within normal limits. Atherosclerotic calcification of the aortic arch. IMPRESSION: 1.  Aortic Atherosclerosis (ICD10-I70.0).  Prior CABG. 2. Stable right middle lobe scarring. Electronically Signed   By: Van Clines M.D.   On: 11/11/2021 14:18    Procedures Procedures  {Document cardiac monitor, telemetry assessment procedure when appropriate:1}  Medications Ordered in ED Medications  0.9 %  sodium chloride infusion (has no administration in time range)    ED Course/ Medical Decision Making/ A&P Clinical Course as of 11/11/21 1554  Mon Nov 11, 2021  1554 GFR, Estimated(!): 48 [AC]    Clinical Course User Index [AC] Prince Rome, PA-C                           Medical Decision Making Amount and/or Complexity of Data Reviewed Labs: ordered. Radiology: ordered.   71 y.o. male presents to the ED for concern of Chest Pain     This involves an extensive number of treatment options, and is a complaint that carries with it a high risk of complications and morbidity.  The emergent differential diagnosis prior to evaluation includes, but is not limited to: ACS, pneumonia, pneumothorax, pulmonary embolism,pericarditis/myocarditis, GERD, PUD, musculoskeletal  This is not an exhaustive differential.   Past Medical History / Co-morbidities / Social History: Hx of HTN, dyslipidemia, prior CABG, prior PCI's, CKD stage III,  GERD, gout, anxiety, depression Social Determinants of Health: None  Additional History:  Internal and external records from outside source obtained and reviewed including ED visits, cardiology  Lab Tests: I ordered, and personally interpreted labs.  The pertinent results include:   I-stat chem 8: Elevated creatinine 1.70, close to baseline CBC: Unremarkable CMP/BMP: GFR 48 Troponin: Initial 6, sequential pending  Imaging Studies: I ordered imaging studies including CXR .   I independently visualized and interpreted imaging which showed no evidence of pneumothorax or pneumonia I agree with the radiologist interpretation.  Cardiac Monitoring: The patient was maintained on a cardiac monitor.  I personally viewed and interpreted the cardiac monitored which showed an underlying rhythm of: normal sinus rhythm  ED Course / Critical Interventions: Pt well-appearing on exam.  Nontoxic, nonseptic, and no acute distress.  Presenting with suspicious history for ACS.  Hx of CABG, multiple PCI's, most recent 1 to 2 years ago.  Chest pain increasing since 1 AM this morning, with radiation into the left arm left jaw and left upper back.  Without N/V, headache, vision changes, lower extremity weakness.  HEART score of 6, moderate risk. Patient received some relief after being given ASA 324 mg and nitroglycerin via EMS prior to arrival. Well's score, low probability of pulmonary embolism.  Labs unremarkable.  Unlikely pneumonia, no cough, no leukocytosis, no fevers, CXR and exam without acute findings.  Unlikely pneumothorax, no findings on CXR.  Unlikely pericarditis/ myocarditis, GERD, PUD, as does not fit clinical picture. Chest pain not exertional.  No evidence of pleural effusion or pulmonary edema on CXR.  Unlikely dissection, no pulse deficit, no tearing chest pain, no neurologic complaints.  Due to patient's high risk plan to continue with ACS work-up.   EKG: Normal sinus rhythm with a rate of 64 and  possible ST elevation in the inferior leads.  In addition to patient's clinical presentation strongly suggestive of ACS.  Consultation placed for cardiology. Spoke with Regan Rakers NP of Cardiology via secure chat, plan for patient to undergo cardiac cath today be admitted via cardiology team. Upon reevaluation, patient's chest pain has began to return.  Another sublingual nitro provided, and IV fluid bolus, BP 130/70 before administration. I have reviewed the patients home medicines and have made adjustments as needed.  Disposition: Considered admission and after reviewing the patient's encounter today, I feel that the patient would benefit from admission with cardiology for further work-up and treatment.  Discussed course of treatment with the patient, whom demonstrated understanding.  Patient in agreement and has no further questions.    I discussed this case with my attending, Dr. Roderic Palau, who agreed with the proposed treatment course and cosigned this note including patient's presenting symptoms, physical exam, and planned diagnostics and interventions.  Attending physician stated agreement with plan or made changes to plan which were implemented.     This chart was dictated using voice recognition software.  Despite best efforts to proofread, errors can occur which can change the documentation meaning.   {Document critical care time when appropriate:1} {Document review of labs and clinical decision tools ie heart score, Chads2Vasc2 etc:1}  {Document your independent review of radiology images, and any outside records:1} {Document your discussion with family members, caretakers, and with consultants:1} {Document social determinants of health affecting pt's care:1} {Document your decision making why or why not admission, treatments were needed:1} Final Clinical Impression(s) / ED Diagnoses Final diagnoses:  Unstable angina ([place])  H/O coronary artery bypass surgery  Primary hypertension   Stage 3 chronic kidney disease, unspecified whether stage 3a or 3b CKD ([place])  Gastroesophageal reflux disease, unspecified whether esophagitis present  Dyslipidemia    Rx / DC Orders ED Discharge Orders     None

## 2021-11-12 ENCOUNTER — Other Ambulatory Visit (HOSPITAL_COMMUNITY): Payer: Medicare Other

## 2021-11-12 DIAGNOSIS — I200 Unstable angina: Secondary | ICD-10-CM | POA: Diagnosis not present

## 2021-11-12 DIAGNOSIS — Z951 Presence of aortocoronary bypass graft: Secondary | ICD-10-CM

## 2021-11-12 LAB — BASIC METABOLIC PANEL
Anion gap: 8 (ref 5–15)
BUN: 22 mg/dL (ref 8–23)
CO2: 23 mmol/L (ref 22–32)
Calcium: 8.9 mg/dL (ref 8.9–10.3)
Chloride: 109 mmol/L (ref 98–111)
Creatinine, Ser: 1.69 mg/dL — ABNORMAL HIGH (ref 0.61–1.24)
GFR, Estimated: 43 mL/min — ABNORMAL LOW (ref >60)
Glucose, Bld: 100 mg/dL — ABNORMAL HIGH (ref 70–99)
Potassium: 4.1 mmol/L (ref 3.5–5.1)
Sodium: 140 mmol/L (ref 135–145)

## 2021-11-12 LAB — CBC
HCT: 41.2 % (ref 39.0–52.0)
Hemoglobin: 14.3 g/dL (ref 13.0–17.0)
MCH: 31.2 pg (ref 26.0–34.0)
MCHC: 34.7 g/dL (ref 30.0–36.0)
MCV: 89.8 fL (ref 80.0–100.0)
Platelets: 220 10*3/uL (ref 150–400)
RBC: 4.59 MIL/uL (ref 4.22–5.81)
RDW: 12.8 % (ref 11.5–15.5)
WBC: 7.6 10*3/uL (ref 4.0–10.5)
nRBC: 0 % (ref 0.0–0.2)

## 2021-11-12 MED ORDER — ISOSORBIDE MONONITRATE ER 60 MG PO TB24
60.0000 mg | ORAL_TABLET | Freq: Every day | ORAL | 3 refills | Status: DC
Start: 2021-11-13 — End: 2021-11-12

## 2021-11-12 MED ORDER — ISOSORBIDE MONONITRATE ER 60 MG PO TB24: 60.0000 mg | ORAL_TABLET | Freq: Every day | ORAL | 3 refills | Status: DC

## 2021-11-12 NOTE — Progress Notes (Signed)
I reviewed d/c instructions with patient and his wife. No further questions, d/c'd via wheelchair to private vehicle in stable condition.

## 2021-11-12 NOTE — Discharge Instructions (Signed)

## 2021-11-12 NOTE — Discharge Summary (Signed)
Discharge Summary    Patient ID: Billy Cisneros. MRN: [id number]; DOB: January 06, 1951  Admit date: 11/11/2021 Discharge date: 11/12/2021  PCP:  Adin Hector, MD   [place] Providers Cardiologist:  Pixie Casino, MD        Discharge Diagnoses    Principal Problem:   Unstable angina [facility]) Active Problems:   Essential hypertension   H/O coronary artery bypass surgery   Hyperlipidemia LDL goal <70   CKD (chronic kidney disease), stage III    Diagnostic Studies/Procedures    LHC 11/11/21: Conclusions: Severe native coronary artery disease including total/subtotal chronic occlusions of the ostial LAD, proximal ramus, and proximal LCx as well as severe diffuse RCA disease. Patent LIMA-LAD with 30% ostial graft stenosis. Widely patent RIMA-rPDA. Patent SVG-ramus intermedius with chronic occlusion of jump segment from ramus to OM. Chronically occluded SVG-D. Upper normal left ventricular filling pressure (LVEDP 15 mmHg).   Recommendations: Continue medical therapy, as angiographic appearance of coronary arteries and bypass grafts is very similar to 2021.  It is possible that running out of isosorbide mononitrate may have brought on recurrent angina. Aggressive secondary prevention. Follow-up echocardiogram.   _____________   History of Present Illness     Billy Cisneros. is a 71 y.o. male with a PMH of CAD s/p CABG in 2000, HTN, HLD, GERD, gout, and anxiety, who presented with chest pain. He reported being woken from sleep 11/11/21 with chest pain. He did not take any SL nitro prior to EMS arrival, though CP was relieved with SL nitro administered by EMS. On arrival to the ED, EKG showed minimal ST elevation. HS Trops were negative. He reported being out of his imdur for 5 days. Decision made to pursue LHC given concerns for unstable angina.  Hospital Course     Consultants: None   1. Unstable angina in patient with CAD s/p CABG in 2000: patient presented  with chest pain waking him from sleep. Symptoms reportedly worse than prior to his CABG. EKG with minimal ST changes. HsTrop negative. He underwent a LHC 11/11/21 which showed stable CAD with patent grafts. Symptoms were felt to be due to missing imdur x5 days. Patient was chest pain free on the day of discharge - Continue aspirin - Continue statin and lovaza - Continue metoprolol  - Restart imdur at 60mg  daily  2. HTN: BP well controlled - Continue metoprolol  3. HLD: no recent lipids; last LDL 27 06/2021 - Continue low dose crestor and lovaza  4. Gout: no problems this admission - Continue home colchicine and allopurinol  5. GERD:  - Continue home omeprazole   6. Anxiety: - Continue home bupropion and prn xanax  Did the patient have an acute coronary syndrome (MI, NSTEMI, STEMI, etc) this admission?:  No                               Did the patient have a percutaneous coronary intervention (stent / angioplasty)?:  No.        A TOC appointment has been scheduled for 11/22/21 with Coletta Memos, NP.  _____________  Discharge Vitals Blood pressure 110/69, pulse 70, temperature 97.7 F (36.5 C), temperature source Oral, resp. rate 16, height 5\' 5"  (1.651 m), weight 77.6 kg, SpO2 98 %.  Filed Weights   11/11/21 1337  Weight: 77.6 kg    Labs & Radiologic Studies    CBC Recent Labs  11/11/21 1955 11/12/21 0122  WBC 6.5 7.6  HGB 14.9 14.3  HCT 43.0 41.2  MCV 90.3 89.8  PLT 244 220   Basic Metabolic Panel Recent Labs    02/25/50 1347 11/11/21 1405 11/12/21 0122  NA 139 139 140  K 4.9 4.8 4.1  CL 105 106 109  CO2 24  --  23  GLUCOSE 113* 109* 100*  BUN 20 22 22   CREATININE 1.55* 1.70* 1.69*  CALCIUM 9.6  --  8.9   Liver Function Tests Recent Labs    11/11/21 1347  AST 25  ALT 26  ALKPHOS 83  BILITOT 0.7  PROT 6.3*  ALBUMIN 3.7   No results for input(s): "LIPASE", "AMYLASE" in the last 72 hours. High Sensitivity Troponin:   Recent Labs  Lab  11/11/21 1347 11/11/21 1548  TROPONINIHS 6 13    BNP Invalid input(s): "POCBNP" D-Dimer No results for input(s): "DDIMER" in the last 72 hours. Hemoglobin A1C No results for input(s): "HGBA1C" in the last 72 hours. Fasting Lipid Panel No results for input(s): "CHOL", "HDL", "LDLCALC", "TRIG", "CHOLHDL", "LDLDIRECT" in the last 72 hours. Thyroid Function Tests No results for input(s): "TSH", "T4TOTAL", "T3FREE", "THYROIDAB" in the last 72 hours.  Invalid input(s): "FREET3" _____________  CARDIAC CATHETERIZATION  Result Date: 11/11/2021 Conclusions: Severe native coronary artery disease including total/subtotal chronic occlusions of the ostial LAD, proximal ramus, and proximal LCx as well as severe diffuse RCA disease. Patent LIMA-LAD with 30% ostial graft stenosis. Widely patent RIMA-rPDA. Patent SVG-ramus intermedius with chronic occlusion of jump segment from ramus to OM. Chronically occluded SVG-D. Upper normal left ventricular filling pressure (LVEDP 15 mmHg). Recommendations: Continue medical therapy, as angiographic appearance of coronary arteries and bypass grafts is very similar to 2021.  It is possible that running out of isosorbide mononitrate may have brought on recurrent angina. Aggressive secondary prevention. Follow-up echocardiogram. 2022, MD [facility]  DG Chest 2 View  Result Date: 11/11/2021 CLINICAL DATA:  Chest pain radiating into the left arm. Shortness of breath. Symptoms started last night. EXAM: CHEST - 2 VIEW COMPARISON:  11/23/2017 FINDINGS: Prior CABG. Heart size within normal limits. Chronic right middle lobe scarring. The lungs appear otherwise clear. No blunting of the costophrenic angles. Heart size within normal limits. Atherosclerotic calcification of the aortic arch. IMPRESSION: 1.  Aortic Atherosclerosis (ICD10-I70.0).  Prior CABG. 2. Stable right middle lobe scarring. Electronically Signed   By: 11/25/2017 M.D.   On: 11/11/2021 14:18     Disposition   Pt is being discharged home today in good condition.  Follow-up Plans & Appointments     Follow-up Information     01/12/2022, NP Follow up on 11/22/2021.   Specialty: Cardiology Why: Please arrive 15 minutes early for your 8:25am post-hospital cardiology appointment Contact information: [address] [place] [phone]                Discharge Instructions     Diet - low sodium heart healthy   Complete by: As directed    Increase activity slowly   Complete by: As directed        Discharge Medications   Allergies as of 11/12/2021   No Known Allergies      Medication List     TAKE these medications    allopurinol 100 MG tablet Commonly known as: ZYLOPRIM Take 100 mg by mouth as needed (gout flare up).   ALPRAZolam 0.5 MG tablet Commonly known as: 01/13/2022  TAKE 1 TABLET BY MOUTH 3 TIMES A DAY AS NEEDED FOR ANXIETY. What changed:  how much to take how to take this when to take this reasons to take this   aspirin EC 81 MG tablet Take 1 tablet (81 mg total) by mouth daily. For heart attack prevention.   buPROPion 300 MG 24 hr tablet Commonly known as: WELLBUTRIN XL Take 1 tablet (300 mg total) by mouth daily.   colchicine 0.6 MG tablet Take 0.6 mg by mouth daily as needed (gout).   HYDROcodone-acetaminophen 5-325 MG tablet Commonly known as: NORCO/VICODIN Take 1 tablet by mouth every 6 (six) hours as needed for moderate pain.   isosorbide mononitrate 60 MG 24 hr tablet Commonly known as: IMDUR Take 1 tablet (60 mg total) by mouth daily. Start taking on: November 13, 2021 What changed:  medication strength how much to take   lisinopril 10 MG tablet Commonly known as: ZESTRIL Take 1 tablet (10 mg total) by mouth daily.   metoprolol succinate 25 MG 24 hr tablet Commonly known as: Toprol XL Take 1 tablet (25 mg total) by mouth daily for 365 doses.   multivitamin with minerals tablet Take 1  tablet by mouth daily.   omega-3 acid ethyl esters 1 g capsule Commonly known as: LOVAZA Take 1 capsule (1 g total) by mouth 2 (two) times daily.   omeprazole 20 MG capsule Commonly known as: PRILOSEC Take 1 capsule (20 mg total) by mouth 2 (two) times daily before a meal.   predniSONE 10 MG tablet Commonly known as: DELTASONE Take by mouth as needed.   rosuvastatin 20 MG tablet Commonly known as: CRESTOR Take 0.5 tablets (10 mg total) by mouth at bedtime. What changed: how much to take   valACYclovir 500 MG tablet Commonly known as: VALTREX Take 500 mg by mouth 2 (two) times daily as needed (outbreak).   zolpidem 10 MG tablet Commonly known as: AMBIEN Take 1 tablet (10 mg total) by mouth at bedtime. What changed:  when to take this reasons to take this           Outstanding Labs/Studies   None  Duration of Discharge Encounter   Greater than 30 minutes including physician time.  Signed, Beatriz Stallion, PA-C 11/12/2021, 11:30 AM

## 2021-11-12 NOTE — Progress Notes (Signed)
DAILY PROGRESS NOTE   Patient Name: Billy Cisneros. Date of Encounter: 11/12/2021 Cardiologist: Chrystie Nose, MD  Chief Complaint   No complaints  Patient Profile   Billy Irani. is a 72 y.o. male with a hx of CAD with CABG 2000 and last cath 2021 with medical therapy also with HTN, anxiety and gout, GERD who is being seen 11/11/2021 for the evaluation of chest pain at the request of Dr. Estell Harpin.  Subjective   No issues overnight - chest pain resolved. Cath yesterday showed stable anatomy. Suspect the fact that he ran out of isosorbide was an issue.  Objective   Vitals:   11/11/21 2205 11/12/21 0004 11/12/21 0300 11/12/21 0805  BP: 99/63 118/79 96/61 110/69  Pulse: 61 70  70  Resp:  18 17 16   Temp:  97.7 F (36.5 C) 97.7 F (36.5 C) 97.7 F (36.5 C)  TempSrc:  Oral Oral Oral  SpO2: 93% 97%  98%  Weight:      Height:        Intake/Output Summary (Last 24 hours) at 11/12/2021 0837 Last data filed at 11/12/2021 0500 Gross per 24 hour  Intake 360 ml  Output 1350 ml  Net -990 ml   Filed Weights   11/11/21 1337  Weight: 77.6 kg    Physical Exam   General appearance: alert and no distress Lungs: clear to auscultation bilaterally Heart: regular rate and rhythm, S1, S2 normal, no murmur, click, rub or gallop Extremities: extremities normal, atraumatic, no cyanosis or edema Psych: Pleasant  Inpatient Medications    Scheduled Meds:  aspirin EC  81 mg Oral Daily   buPROPion  300 mg Oral Daily   colchicine  0.6 mg Oral Daily   enoxaparin (LOVENOX) injection  40 mg Subcutaneous Q24H   isosorbide mononitrate  30 mg Oral Daily   lisinopril  10 mg Oral Daily   metoprolol succinate  25 mg Oral Daily   multivitamin with minerals  1 tablet Oral Daily   omega-3 acid ethyl esters  1 g Oral BID   pantoprazole  40 mg Oral Daily   rosuvastatin  10 mg Oral QHS   sodium chloride flush  3 mL Intravenous Q12H   sodium chloride flush  3 mL Intravenous Q12H     Continuous Infusions:  sodium chloride 100 mL/hr at 11/11/21 1553   sodium chloride     sodium chloride     sodium chloride      PRN Meds: sodium chloride, sodium chloride, acetaminophen, ALPRAZolam, ondansetron (ZOFRAN) IV, sodium chloride flush, sodium chloride flush, zolpidem   Labs   Results for orders placed or performed during the hospital encounter of 11/11/21 (from the past 48 hour(s))  CBC     Status: None   Collection Time: 11/11/21  1:47 PM  Result Value Ref Range   WBC 7.0 4.0 - 10.5 K/uL   RBC 4.83 4.22 - 5.81 MIL/uL   Hemoglobin 14.8 13.0 - 17.0 g/dL   HCT 01/12/22 45.4 - 09.8 %   MCV 91.3 80.0 - 100.0 fL   MCH 30.6 26.0 - 34.0 pg   MCHC 33.6 30.0 - 36.0 g/dL   RDW 11.9 14.7 - 82.9 %   Platelets 245 150 - 400 K/uL   nRBC 0.0 0.0 - 0.2 %    Comment: Performed at [facility], [address], [place], [place]  Troponin I (High Sensitivity)     Status: None   Collection  Time: 11/11/21  1:47 PM  Result Value Ref Range   Troponin I (High Sensitivity) 6 <18 ng/L    Comment: (NOTE) Elevated high sensitivity troponin I (hsTnI) values and significant  changes across serial measurements may suggest ACS but many other  chronic and acute conditions are known to elevate hsTnI results.  Refer to the "Links" section for chest pain algorithms and additional  guidance. Performed at [facility], [address], [place], [place] [address]   Comprehensive metabolic panel     Status: Abnormal   Collection Time: 11/11/21  1:47 PM  Result Value Ref Range   Sodium 139 135 - 145 mmol/L   Potassium 4.9 3.5 - 5.1 mmol/L   Chloride 105 98 - 111 mmol/L   CO2 24 22 - 32 mmol/L   Glucose, Bld 113 (H) 70 - 99 mg/dL    Comment: Glucose reference range applies only to samples taken after fasting for at least 8 hours.   BUN 20 8 - 23 mg/dL   Creatinine, Ser 8.18 (H) 0.61 - 1.24 mg/dL   Calcium 9.6 8.9 - 56.3 mg/dL   Total Protein 6.3 (L) 6.5 - 8.1 g/dL    Albumin 3.7 3.5 - 5.0 g/dL   AST 25 15 - 41 U/L   ALT 26 0 - 44 U/L   Alkaline Phosphatase 83 38 - 126 U/L   Total Bilirubin 0.7 0.3 - 1.2 mg/dL   GFR, Estimated 48 (L) >60 mL/min    Comment: (NOTE) Calculated using the CKD-EPI Creatinine Equation (2021)    Anion gap 10 5 - 15    Comment: Performed at [facility], [address], [place], [place] [address]  I-Stat Chem 8, ED     Status: Abnormal   Collection Time: 11/11/21  2:05 PM  Result Value Ref Range   Sodium 139 135 - 145 mmol/L   Potassium 4.8 3.5 - 5.1 mmol/L   Chloride 106 98 - 111 mmol/L   BUN 22 8 - 23 mg/dL   Creatinine, Ser 2.63 (H) 0.61 - 1.24 mg/dL   Glucose, Bld 785 (H) 70 - 99 mg/dL    Comment: Glucose reference range applies only to samples taken after fasting for at least 8 hours.   Calcium, Ion 1.08 (L) 1.15 - 1.40 mmol/L   TCO2 24 22 - 32 mmol/L   Hemoglobin 14.3 13.0 - 17.0 g/dL   HCT 88.5 02.7 - 74.1 %  Troponin I (High Sensitivity)     Status: None   Collection Time: 11/11/21  3:48 PM  Result Value Ref Range   Troponin I (High Sensitivity) 13 <18 ng/L    Comment: (NOTE) Elevated high sensitivity troponin I (hsTnI) values and significant  changes across serial measurements may suggest ACS but many other  chronic and acute conditions are known to elevate hsTnI results.  Refer to the "Links" section for chest pain algorithms and additional  guidance. Performed at [facility], [address] [place], [place] [address]   CBC     Status: None   Collection Time: 11/11/21  7:55 PM  Result Value Ref Range   WBC 6.5 4.0 - 10.5 K/uL   RBC 4.76 4.22 - 5.81 MIL/uL   Hemoglobin 14.9 13.0 - 17.0 g/dL   HCT 76.7 20.9 - 47.0 %   MCV 90.3 80.0 - 100.0 fL   MCH 31.3 26.0 - 34.0 pg   MCHC 34.7 30.0 - 36.0 g/dL   RDW 96.2 83.6 -  15.5 %   Platelets 244 150 - 400 K/uL   nRBC 0.0 0.0 - 0.2 %    Comment: Performed at [facility], [address], [place], [place] [address]  CBC     Status: None    Collection Time: 11/12/21  1:22 AM  Result Value Ref Range   WBC 7.6 4.0 - 10.5 K/uL   RBC 4.59 4.22 - 5.81 MIL/uL   Hemoglobin 14.3 13.0 - 17.0 g/dL   HCT 81.8 29.9 - 37.1 %   MCV 89.8 80.0 - 100.0 fL   MCH 31.2 26.0 - 34.0 pg   MCHC 34.7 30.0 - 36.0 g/dL   RDW 69.6 78.9 - 38.1 %   Platelets 220 150 - 400 K/uL   nRBC 0.0 0.0 - 0.2 %    Comment: Performed at [facility], [address] [place], [place] [address]  Basic metabolic panel     Status: Abnormal   Collection Time: 11/12/21  1:22 AM  Result Value Ref Range   Sodium 140 135 - 145 mmol/L   Potassium 4.1 3.5 - 5.1 mmol/L   Chloride 109 98 - 111 mmol/L   CO2 23 22 - 32 mmol/L   Glucose, Bld 100 (H) 70 - 99 mg/dL    Comment: Glucose reference range applies only to samples taken after fasting for at least 8 hours.   BUN 22 8 - 23 mg/dL   Creatinine, Ser 0.25 (H) 0.61 - 1.24 mg/dL   Calcium 8.9 8.9 - 85.2 mg/dL   GFR, Estimated 43 (L) >60 mL/min    Comment: (NOTE) Calculated using the CKD-EPI Creatinine Equation (2021)    Anion gap 8 5 - 15    Comment: Performed at [facility], [address], [place], [place] [address]    ECG   N/A  Telemetry   Sinus rhythm - Personally Reviewed  Radiology    CARDIAC CATHETERIZATION  Result Date: 11/11/2021 Conclusions: Severe native coronary artery disease including total/subtotal chronic occlusions of the ostial LAD, proximal ramus, and proximal LCx as well as severe diffuse RCA disease. Patent LIMA-LAD with 30% ostial graft stenosis. Widely patent RIMA-rPDA. Patent SVG-ramus intermedius with chronic occlusion of jump segment from ramus to OM. Chronically occluded SVG-D. Upper normal left ventricular filling pressure (LVEDP 15 mmHg). Recommendations: Continue medical therapy, as angiographic appearance of coronary arteries and bypass grafts is very similar to 2021.  It is possible that running out of isosorbide mononitrate may have brought on recurrent angina.  Aggressive secondary prevention. Follow-up echocardiogram. Yvonne Kendall, MD [facility]  DG Chest 2 View  Result Date: 11/11/2021 CLINICAL DATA:  Chest pain radiating into the left arm. Shortness of breath. Symptoms started last night. EXAM: CHEST - 2 VIEW COMPARISON:  11/23/2017 FINDINGS: Prior CABG. Heart size within normal limits. Chronic right middle lobe scarring. The lungs appear otherwise clear. No blunting of the costophrenic angles. Heart size within normal limits. Atherosclerotic calcification of the aortic arch. IMPRESSION: 1.  Aortic Atherosclerosis (ICD10-I70.0).  Prior CABG. 2. Stable right middle lobe scarring. Electronically Signed   By: Gaylyn Rong M.D.   On: 11/11/2021 14:18    Cardiac Studies   N/A  Assessment   Active Problems:   Unstable angina [facility])   Plan   Chest pain has improved - cath showed stable anatomy. Will restart isosorbide but increase dose to 60 mg daily. Will provide hard copy given the holiday since he has no meds at home. Ok to  d/c home today. Follow-up with me or Edd Fabian, NP in 2-3 weeks.  Time Spent Directly with Patient:  I have spent a total of 25 minutes with the patient reviewing hospital notes, telemetry, EKGs, labs and examining the patient as well as establishing an assessment and plan that was discussed personally with the patient.  > 50% of time was spent in direct patient care.  Length of Stay:  LOS: 1 day   Chrystie Nose, MD, [facility], FACP  [place]  [facility]  Medical Director of the Advanced Lipid Disorders &  Cardiovascular Risk Reduction Clinic Diplomate of the American Board of Clinical Lipidology Attending Cardiologist  Direct Dial: [phone]  Fax: [phone]  Website:  [email or web address] 11/12/2021, 8:38 AM

## 2021-11-13 ENCOUNTER — Encounter (HOSPITAL_COMMUNITY): Payer: Self-pay | Admitting: Internal Medicine

## 2021-11-13 LAB — LIPOPROTEIN A (LPA): Lipoprotein (a): 81.1 nmol/L — ABNORMAL HIGH (ref <75.0)

## 2021-11-19 NOTE — Progress Notes (Signed)
Cardiology Clinic Note   Patient Name: Billy Cisneros. Date of Encounter: 11/22/2021  Primary Care Provider:  Lynnea Ferrier, MD Primary Cardiologist:  Chrystie Nose, MD  Patient Profile    Billy Cisneros. 71 year old male presents the clinic today for follow-up evaluation of his coronary artery disease .  Past Medical History    Past Medical History:  Diagnosis Date   Anxiety    Coronary artery disease    Depression    GERD (gastroesophageal reflux disease)    Gout    Hypertension    Unstable angina (HCC) 11/11/2021   Past Surgical History:  Procedure Laterality Date   CARDIAC CATHETERIZATION     CARDIAC SURGERY     COLONOSCOPY WITH PROPOFOL N/A 05/25/2017   Procedure: COLONOSCOPY WITH PROPOFOL;  Surgeon: Wyline Mood, MD;  Location: [facility] ENDOSCOPY;  Service: Gastroenterology;  Laterality: N/A;   CORONARY ARTERY BYPASS GRAFT     ESOPHAGOGASTRODUODENOSCOPY N/A 10/27/2017   Procedure: ESOPHAGOGASTRODUODENOSCOPY (EGD);  Surgeon: Toney Reil, MD;  Location: [facility] ENDOSCOPY;  Service: Gastroenterology;  Laterality: N/A;   ESOPHAGOGASTRODUODENOSCOPY (EGD) WITH PROPOFOL N/A 11/19/2017   Procedure: ESOPHAGOGASTRODUODENOSCOPY (EGD) WITH PROPOFOL;  Surgeon: Toney Reil, MD;  Location: [facility] ENDOSCOPY;  Service: Gastroenterology;  Laterality: N/A;   LEFT HEART CATH AND CORS/GRAFTS ANGIOGRAPHY N/A 10/14/2019   Procedure: LEFT HEART CATH AND CORS/GRAFTS ANGIOGRAPHY;  Surgeon: Lyn Records, MD;  Location: [facility] INVASIVE CV LAB;  Service: Cardiovascular;  Laterality: N/A;   LEFT HEART CATH AND CORS/GRAFTS ANGIOGRAPHY N/A 11/11/2021   Procedure: LEFT HEART CATH AND CORS/GRAFTS ANGIOGRAPHY;  Surgeon: Yvonne Kendall, MD;  Location: [facility] INVASIVE CV LAB;  Service: Cardiovascular;  Laterality: N/A;    Allergies  No Known Allergies  History of Present Illness    Billy Barb. has a PMH of coronary artery disease status post CABG in 2000 (LIMA to LAD, RIMA to  RCA, SVG to OM1, diagonal and ramus) underwent cardiac catheterization 10/2019 which showed patent LIMA-LAD, patent RIMA-PDA, total occluded distal left main, totally occluded ostial circumflex, totally occluded ostial LAD and normal LVEDP.  Medical management was recommended.  His PMH also includes HTN, anxiety, gout, and GERD.  He was seen in follow-up by Dr. Rennis Golden on 09/23/2019.  During that time he reported difficulty with chest discomfort.  He described exertional type chest pain with left arm heaviness.  His symptoms improved with rest.  It was noted that his LDL cholesterol was 62 but his triglycerides remained elevated at 320.  He underwent cardiac catheterization by Dr. Katrinka Blazing 6/21.  At that time his Imdur was uptitrated.  He presented to the clinic 10/30/20 for follow-up evaluation stated he had not noticed a difference in his chest discomfort with the addition of metoprolol.  He did not start his Imdur medication.  He reported that with increased physical activity he noticed chest discomfort.  He gave an example of taking out his trash.  When he would rest after increased activity his chest discomfort would ease off and go away.  We reviewed his angiography and he and his wife expressed understanding.  He reported that he generally was fairly sedentary.  He also reported that he did not eat much fiber.  We reviewed his cholesterol panel and he reported that over the last several months he had not watched his diet as closely.  I had him start his Imdur, continue metoprolol, increase his physical activity, start fiber supplementation, and planned follow-up in 6  months.    He presented to the clinic 09/12/21 for follow-up evaluation and stated he felt well.  He was unable to come to his last 2 appointments due to his daughter being in a car accident and then having flu.  We reviewed his current medication list and he is tolerating his medications well without side effects.  He did note some atypical chest  discomfort at night when he laid down.  However, he denied exertional chest discomfort.  He had been walking on the treadmill daily and going to the [facility] 1 time per week.  He reported that he had been losing some weight.  I refilled his metoprolol, requested his updated lab work from his PCP and planned follow-up for 12 months.  He presented to the emergency department on 11/11/2021 with unstable angina.  He underwent left heart cath which showed severe coronary artery disease including subtotal chronic occlusion of the ostial LAD, proximal ramus, proximal circumflex and severe diffuse RCA disease.  He had a patent LIMA-LAD 30% stenosed, widely patent RIMA-RPDA, patent SVG-ramus, chronically occluded SVG-D.  Medical management was continued.  It was felt that running out of his isosorbide mononitrate brought on his recurrent angina.  He was discharged in stable condition on 11/12/2021.  He presents to the clinic today for follow-up evaluation states that he has been off of his Imdur for about 5 days.  He did not have a new prescription of sublingual nitroglycerin.  He reports that he had nitro spray in the past but never used it and his prescription was old.  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  He occasionally lifts heavier materials in and out of his [employer].  He does report a fall out of bed yesterday.  He reports he rolled the wrong way.  We reviewed the importance of heart healthy low-sodium diet.  We reviewed his angiography and he expressed understanding.  I will refill his sublingual nitroglycerin, continue his current medication regimen, have him maintain his physical activity, give the salty 6 diet sheet, and plan follow-up in 4 to 6 months.  Today he denies chest pain, shortness of breath, lower extremity edema, fatigue, palpitations, melena, hematuria, hemoptysis, diaphoresis, weakness, presyncope, syncope, orthopnea, and PND.     Home Medications    Prior to Admission medications    Medication Sig Start Date End Date Taking? Authorizing Provider  ALPRAZolam (XANAX) 0.5 MG tablet TAKE 1 TABLET BY MOUTH 3 TIMES A DAY AS NEEDED FOR ANXIETY. Patient taking differently: Take 0.5 mg by mouth 3 (three) times daily as needed for anxiety. TAKE 1 TABLET BY MOUTH 3 TIMES A DAY AS NEEDED FOR ANXIETY. 06/24/17   Ellyn Hack, MD  aspirin EC 81 MG tablet Take 1 tablet (81 mg total) by mouth daily. For heart attack prevention. 11/22/11   Tamala Julian, PA-C  buPROPion (WELLBUTRIN XL) 300 MG 24 hr tablet Take 1 tablet (300 mg total) by mouth daily. 06/24/17   Ellyn Hack, MD  HYDROcodone-acetaminophen (NORCO/VICODIN) 5-325 MG tablet Take 1 tablet by mouth every 6 (six) hours as needed for moderate pain.  12/29/17   [provider]  icosapent Ethyl (VASCEPA) 1 g capsule Take 2 capsules (2 g total) by mouth 2 (two) times daily. 09/23/19   Hilty, Lisette Abu, MD  isosorbide mononitrate (IMDUR) 30 MG 24 hr tablet Take 1 tablet (30 mg total) by mouth daily. 10/14/19 10/13/20  Lyn Records, MD  lisinopril (PRINIVIL,ZESTRIL) 10 MG tablet  Take 1 tablet (10 mg total) by mouth daily. 06/24/17   Ellyn HackShah, Syed Asad A, MD  metoprolol succinate (TOPROL XL) 25 MG 24 hr tablet Take 1 tablet (25 mg total) by mouth daily. 10/14/19 10/13/20  Lyn RecordsSmith, Henry W, MD  Multiple Vitamins-Minerals (MULTIVITAMIN WITH MINERALS) tablet Take 1 tablet by mouth daily.    [provider]  omeprazole (PRILOSEC) 20 MG capsule Take 1 capsule (20 mg total) by mouth 2 (two) times daily before a meal. 06/24/17   Ellyn HackShah, Syed Asad A, MD  simvastatin (ZOCOR) 40 MG tablet Take 1 tablet (40 mg total) by mouth at bedtime. 06/24/17   Ellyn HackShah, Syed Asad A, MD  valACYclovir (VALTREX) 500 MG tablet Take 500 mg by mouth 2 (two) times daily as needed (outbreak).     [provider]  vitamin B-12 (CYANOCOBALAMIN) 500 MCG tablet Take 500 mcg by mouth daily.    [provider]  zolpidem (AMBIEN) 10 MG tablet Take 1 tablet  (10 mg total) by mouth at bedtime. Patient taking differently: Take 10 mg by mouth at bedtime as needed for sleep.  06/24/17   Ellyn HackShah, Syed Asad A, MD    Family History    Family History  Problem Relation Age of Onset   Heart attack Mother    Hyperlipidemia Mother    Hypertension Mother    Cancer Father        lung   Diabetes Sister    Diabetes Brother    Asthma Daughter    Cancer Maternal Grandmother    Stroke Maternal Grandfather    Heart attack Paternal Grandfather    He indicated that his mother is deceased. He indicated that his father is deceased. He indicated that his sister is alive. He indicated that his brother is alive. He indicated that his maternal grandmother is deceased. He indicated that his maternal grandfather is deceased. He indicated that his paternal grandmother is deceased. He indicated that his paternal grandfather is deceased. He indicated that his daughter is alive.   Social History    Social History   Socioeconomic History   Marital status: Married    Spouse name: Not on file   Number of children: Not on file   Years of education: Not on file   Highest education level: Not on file  Occupational History   Not on file  Tobacco Use   Smoking status: Never   Smokeless tobacco: Never  Vaping Use   Vaping Use: Never used  Substance and Sexual Activity   Alcohol use: Yes    Alcohol/week: 0.0 standard drinks of alcohol    Comment: 3 beer a month   Drug use: Yes    Frequency: 2.0 times per week    Types: Cocaine    Comment: last use 15-20 years per patient   Sexual activity: Yes    Birth control/protection: None  Other Topics Concern   Not on file  Social History Narrative   Not on file   Social Determinants of Health   Financial Resource Strain: Not on file  Food Insecurity: Not on file  Transportation Needs: Not on file  Physical Activity: Not on file  Stress: Not on file  Social Connections: Not on file  Intimate Partner Violence: Not on  file     Review of Systems    General:  No chills, fever, night sweats or weight changes.  Cardiovascular:  No chest pain, dyspnea on exertion, edema, orthopnea, palpitations, paroxysmal nocturnal dyspnea. Dermatological: No rash, lesions/masses  Respiratory: No cough, dyspnea Urologic: No hematuria, dysuria Abdominal:   No nausea, vomiting, diarrhea, bright red blood per rectum, melena, or hematemesis Neurologic:  No visual changes, wkns, changes in mental status. All other systems reviewed and are otherwise negative except as noted above.  Physical Exam    VS:  BP 132/78   Pulse 68   Ht 5\' 5"  (1.651 m)   Wt 175 lb (79.4 kg)   SpO2 98%   BMI 29.12 kg/m  , BMI Body mass index is 29.12 kg/m. GEN: Well nourished, well developed, in no acute distress. HEENT: normal. Neck: Supple, no JVD, carotid bruits, or masses. Cardiac: RRR, no murmurs, rubs, or gallops. No clubbing, cyanosis, edema.  Radials/DP/PT 2+ and equal bilaterally.  Respiratory:  Respirations regular and unlabored, clear to auscultation bilaterally. GI: Soft, nontender, nondistended, BS + x 4. MS: no deformity or atrophy. Skin: warm and dry, no rash. Neuro:  Strength and sensation are intact. Psych: Normal affect.  Accessory Clinical Findings    Recent Labs: 11/11/2021: ALT 26 11/12/2021: BUN 22; Creatinine, Ser 1.69; Hemoglobin 14.3; Platelets 220; Potassium 4.1; Sodium 140   Recent Lipid Panel    Component Value Date/Time   CHOL 173 09/04/2017 2338   CHOL 146 01/08/2015 0925   CHOL 141 08/06/2011 0327   TRIG 201 (H) 09/04/2017 2338   TRIG 375 (H) 08/06/2011 0327   HDL 39 (L) 09/04/2017 2338   HDL 42 01/08/2015 0925   HDL 25 (L) 08/06/2011 0327   CHOLHDL 4.4 09/04/2017 2338   VLDL 40 09/04/2017 2338   VLDL 75 (H) 08/06/2011 0327   LDLCALC 94 09/04/2017 2338   LDLCALC 76 01/08/2015 0925   LDLCALC 41 08/06/2011 0327    ECG personally reviewed by me today none today.  EKG 09/12/2021  normal sinus rhythm  80 bpm  Cardiac catheterization 10/14/2019 Patent saphenous vein graft to the ramus intermedius but occlusion occlusion of the saphenous vein graft limb to the obtuse marginal.   Patent LIMA to distal LAD Patent RIMA to PDA Totally occluded distal left main Totally occluded ostial circumflex Totally occluded ostial LAD Normal LV end-diastolic pressure.   RECOMMENDATIONS:   Toprol-XL 25 mg/day After 1 week if angina is still significant, add Imdur 30 mg/day. Further titrate anti-ischemic therapy until relief of symptoms. No interventional targets.    Diagnostic Dominance: Right    Intervention   Assessment & Plan   1.  Coronary artery disease-no chest pain today.  Cardiac catheterization 11/11/21 showed severe coronary artery disease including subtotal chronic occlusion of the ostial LAD, proximal ramus, proximal circumflex and severe diffuse RCA disease.  He had a patent LIMA-LAD 30% stenosed, widely patent RIMA-RPDA, patent SVG-ramus, chronically occluded SVG-D.  Medical management was continued.  It was felt that running out of his isosorbide mononitrate caused recurrent angina. Continue aspirin, Lovaza,  Imdur, metoprolol, Crestor Heart healthy low-sodium diet Increase physical activity as tolerated  Essential hypertension-BP today 132/78.  Well-controlled at home. Continue metoprolol, Imdur, lisinopril Heart healthy low-sodium diet Increase physical activity as tolerated  Hyperlipidemia-LDL 84 on 05/21/2020 Continue Lovaza, Crestor Heart healthy low-sodium high-fiber diet Increase physical activity as tolerated Follows with PCP   Disposition: Follow-up with Dr. 07/19/2020 in 4-6 months.  Rennis Golden. Enrico Eaddy NP-C    11/22/2021, 8:50 AM [facility] [address] Office [phone] Fax [phone]  Notice: This dictation was prepared with Dragon dictation along with smaller phrase technology. Any transcriptional errors that result  from this  process are unintentional and may not be corrected upon review.  I spent 13 minutes examining this patient, reviewing medications, and using patient centered shared decision making involving her cardiac care.  Prior to her visit I spent greater than 20 minutes reviewing her past medical history,  medications, and prior cardiac tests.

## 2021-11-22 ENCOUNTER — Encounter: Payer: Self-pay | Admitting: General Practice

## 2021-11-22 ENCOUNTER — Ambulatory Visit: Payer: Medicare Other | Admitting: General Practice

## 2021-11-22 VITALS — BP 132/78 | HR 68 | Ht 65.0 in | Wt 175.0 lb

## 2021-11-22 DIAGNOSIS — I2510 Atherosclerotic heart disease of native coronary artery without angina pectoris: Secondary | ICD-10-CM | POA: Diagnosis not present

## 2021-11-22 DIAGNOSIS — I10 Essential (primary) hypertension: Secondary | ICD-10-CM

## 2021-11-22 DIAGNOSIS — E782 Mixed hyperlipidemia: Secondary | ICD-10-CM

## 2021-11-22 MED ORDER — NITROGLYCERIN 0.4 MG SL SUBL: 0.4000 mg | SUBLINGUAL_TABLET | SUBLINGUAL | 3 refills | Status: DC | PRN

## 2021-11-22 NOTE — Patient Instructions (Signed)
Medication Instructions:  The current medical regimen is effective;  continue present plan and medications as directed. Please refer to the Current Medication list given to you today.   *If you need a refill on your cardiac medications before your next appointment, please call your pharmacy*  Lab Work:   Testing/Procedures:  none    none  If you have labs (blood work) drawn today and your tests are completely normal, you will receive your results only by: MyChart Message (if you have MyChart) OR  A paper copy in the mail If you have any lab test that is abnormal or we need to change your treatment, we will call you to review the results.  Special Instructions PLEASE READ AND FOLLOW SALTY 6-ATTACHED-1,800mg  daily  PLEASE MAINTAIN PHYSICAL ACTIVITY AS TOLERATED   Follow-Up: Your next appointment:  6 month(s) with Chrystie Nose, MD    Please call our office 2 months in advance to schedule this appointment  :1  At [facility], you and your health needs are our priority.  As part of our continuing mission to provide you with exceptional heart care, we have created designated Provider Care Teams.  These Care Teams include your primary Cardiologist (physician) and Advanced Practice Providers (APPs -  Physician Assistants and Nurse Practitioners) who all work together to provide you with the care you need, when you need it.  Important Information About Sugar             6 SALTY THINGS TO AVOID     1,800MG  DAILY

## 2021-12-16 ENCOUNTER — Telehealth: Payer: Self-pay | Admitting: Internal Medicine

## 2021-12-16 MED ORDER — OMEGA-3-ACID ETHYL ESTERS 1 G PO CAPS: 1.0000 g | ORAL_CAPSULE | Freq: Two times a day (BID) | ORAL | 3 refills | Status: DC

## 2021-12-16 NOTE — Telephone Encounter (Signed)
*  STAT* If patient is at the pharmacy, call can be transferred to refill team.   1. Which medications need to be refilled? (please list name of each medication and dose if known)   omega-3 acid ethyl esters (LOVAZA) 1 g capsule    2. Which pharmacy/location (including street and city if local pharmacy) is medication to be sent to?  [id number] [address] [facility] - [place], [place] - [address]. Do they need a 30 day or 90 day supply? 90   Pt already had an appt 11/22/21 He is due for a f/u next may and schedule is not open yet.

## 2021-12-16 NOTE — Telephone Encounter (Signed)
REFILLED

## 2022-06-30 ENCOUNTER — Other Ambulatory Visit: Payer: Self-pay

## 2022-06-30 MED ORDER — OMEGA-3-ACID ETHYL ESTERS 1 G PO CAPS: 1.0000 g | ORAL_CAPSULE | Freq: Two times a day (BID) | ORAL | 3 refills | Status: DC

## 2022-08-08 ENCOUNTER — Telehealth: Payer: Self-pay | Admitting: Internal Medicine

## 2022-08-08 MED ORDER — NITROGLYCERIN 0.4 MG SL SUBL: 0.4000 mg | SUBLINGUAL_TABLET | SUBLINGUAL | 3 refills | Status: DC | PRN

## 2022-08-08 NOTE — Telephone Encounter (Signed)
Refills has been sent to the pharmacy. 

## 2022-08-08 NOTE — Telephone Encounter (Signed)
*  STAT* If patient is at the pharmacy, call can be transferred to refill team.   1. Which medications need to be refilled? (please list name of each medication and dose if known)   nitroGLYCERIN (NITROSTAT) 0.4 MG SL tablet     2. Which pharmacy/location (including street and city if local pharmacy) is medication to be sent to?  [facility] [address] - [place], [place] [address]    3. Do they need a 30 day or 90 day supply? 30 day    Patient is currently completely out of medication.

## 2022-10-03 ENCOUNTER — Ambulatory Visit
Admission: EM | Admit: 2022-10-03 | Discharge: 2022-10-03 | Disposition: A | Discharge status: Home / Self Care | Payer: Medicare Other | Attending: Urgent Care | Admitting: Urgent Care

## 2022-10-03 ENCOUNTER — Ambulatory Visit: Payer: Medicare Other | Attending: Internal Medicine | Admitting: Internal Medicine

## 2022-10-03 ENCOUNTER — Encounter: Payer: Self-pay | Admitting: Internal Medicine

## 2022-10-03 VITALS — BP 116/62 | HR 78 | Ht 65.0 in | Wt 173.6 lb

## 2022-10-03 DIAGNOSIS — S61012A Laceration without foreign body of left thumb without damage to nail, initial encounter: Secondary | ICD-10-CM

## 2022-10-03 DIAGNOSIS — Z951 Presence of aortocoronary bypass graft: Secondary | ICD-10-CM | POA: Diagnosis not present

## 2022-10-03 DIAGNOSIS — I10 Essential (primary) hypertension: Secondary | ICD-10-CM | POA: Diagnosis not present

## 2022-10-03 DIAGNOSIS — E782 Mixed hyperlipidemia: Secondary | ICD-10-CM | POA: Diagnosis not present

## 2022-10-03 DIAGNOSIS — I2510 Atherosclerotic heart disease of native coronary artery without angina pectoris: Secondary | ICD-10-CM | POA: Diagnosis not present

## 2022-10-03 DIAGNOSIS — Z23 Encounter for immunization: Secondary | ICD-10-CM | POA: Diagnosis not present

## 2022-10-03 MED ORDER — ROSUVASTATIN CALCIUM 20 MG PO TABS: 20.0000 mg | ORAL_TABLET | Freq: Every day | ORAL | 3 refills | Status: AC

## 2022-10-03 MED ORDER — METOPROLOL SUCCINATE ER 25 MG PO TB24: 25.0000 mg | ORAL_TABLET | Freq: Every day | ORAL | 3 refills | Status: DC

## 2022-10-03 MED ORDER — TETANUS-DIPHTH-ACELL PERTUSSIS 5-2.5-18.5 LF-MCG/0.5 IM SUSY
0.5000 mL | PREFILLED_SYRINGE | Freq: Once | INTRAMUSCULAR | Status: AC
  Administered 2022-10-03: 0.5 mL via INTRAMUSCULAR

## 2022-10-03 NOTE — ED Triage Notes (Addendum)
Patient presents to UC for laceration to left thumb about an hr ago. He cut it with old saw. Last Tdap 06/23/16.

## 2022-10-03 NOTE — Progress Notes (Signed)
OFFICE NOTE  Chief Complaint:  Routine follow-up  Primary Care Physician: Lynnea Ferrier, MD  HPI:  Billy Gores. is a 72 y.o. male with a past medial history significant for coronary artery disease, formerly followed by Dr. Darrold Junker in [place], with a history of 5 vessel CABG in 2000 (LIMA to LAD, RIMA to RCA, SVG to OM1, diagonal and ramus), patent grafts by catheterization 2011, who presented in April 2019 with chest pain.  He was last seen in the office in 2016.  He had a stress test in 2014 which showed no reversible ischemia at [facility].  He also has a history of hypertension, anxiety, gout and GERD.  He presented with left-sided chest pain that was exertional over the past 3 to 4 days.  He has been working mowing lawns.  He noted pain that radiated to his neck and down his left arm.  Blood pressure was initially elevated in the emergency department.  Creatinine is elevated 1.69.  Troponin was negative overnight x4.  Lipid profile showed total cholesterol 173, HDL 39, LDL 94 and triglycerides 201.  Chest x-ray showed no acute cardiopulmonary disease.  EKG shows normal sinus rhythm without ischemia, personally reviewed.  He underwent nuclear stress testing which showed no reversible ischemia and LVEF of 68%.  This was considered a low risk study.  My concern was for cervical radiculopathy causing his symptoms.  Subsequently he unfortunately had a fall off of a ladder was seen in the [place] ER.  They did do cervical C-spine films as well as an MRI which showed multilevel disc disease some disc protrusion and spondylolysis.  These were considered pre-existing findings however could likely explain his symptoms.  Subsequently was referred to a physiatrist and has been undergoing injections of the neck, but has not noted much improvement.  He denies any further chest pain or worsening shortness of breath.  09/23/2019  Billy Cisneros returns for follow-up.  Its  been sometime since he was here.  More recently though he has been having some difficulty with discomfort in his chest.  He says this is exertional and improves at rest at times has been complicated by some left arm heaviness.  Symptoms are obviously a little concerning for coronary disease.  He is more than 20 years out from his bypass grafting and had had patent grafts in 2011 and a stress test in 2019 with no ischemia.  Recent labs were reasonably well-controlled with an LDL 62 however his triglycerides remain very elevated at 320.  This is a possible persistent cardiovascular risk factor and I suspect will need treatment.  10/03/2022  Billy Cisneros returns today for follow-up.  I saw him last year and he was actually referred for cardiac catheterization for ongoing chest pain symptoms.  Repeat cath showed essentially stable coronary anatomy.  He had run out of his isosorbide and he was suspicious that that was the cause of his chest pain.  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  As part of that he has to lift 70 pound batteries and he says he gets chest pain when he does this.  He often needs to take nitroglycerin to resolve it.  Unfortunately restricting that would not allow him to do his job and there is no one that can assist him with it.  He essentially can  lift the batteries up onto a cart but then is able to use the cart to carry them.  Repeat EKG today is normal.  PMHx:  Past Medical History:  Diagnosis Date   Anxiety    Coronary artery disease    Depression    GERD (gastroesophageal reflux disease)    Gout    Hypertension    Unstable angina (HCC) 11/11/2021    Past Surgical History:  Procedure Laterality Date   CARDIAC CATHETERIZATION     CARDIAC SURGERY     COLONOSCOPY WITH PROPOFOL N/A 05/25/2017   Procedure: COLONOSCOPY WITH PROPOFOL;  Surgeon: Wyline Mood, MD;   Location: [facility] ENDOSCOPY;  Service: Gastroenterology;  Laterality: N/A;   CORONARY ARTERY BYPASS GRAFT     ESOPHAGOGASTRODUODENOSCOPY N/A 10/27/2017   Procedure: ESOPHAGOGASTRODUODENOSCOPY (EGD);  Surgeon: Toney Reil, MD;  Location: [facility] ENDOSCOPY;  Service: Gastroenterology;  Laterality: N/A;   ESOPHAGOGASTRODUODENOSCOPY (EGD) WITH PROPOFOL N/A 11/19/2017   Procedure: ESOPHAGOGASTRODUODENOSCOPY (EGD) WITH PROPOFOL;  Surgeon: Toney Reil, MD;  Location: [facility] ENDOSCOPY;  Service: Gastroenterology;  Laterality: N/A;   LEFT HEART CATH AND CORS/GRAFTS ANGIOGRAPHY N/A 10/14/2019   Procedure: LEFT HEART CATH AND CORS/GRAFTS ANGIOGRAPHY;  Surgeon: Lyn Records, MD;  Location: [facility] INVASIVE CV LAB;  Service: Cardiovascular;  Laterality: N/A;   LEFT HEART CATH AND CORS/GRAFTS ANGIOGRAPHY N/A 11/11/2021   Procedure: LEFT HEART CATH AND CORS/GRAFTS ANGIOGRAPHY;  Surgeon: Yvonne Kendall, MD;  Location: [facility] INVASIVE CV LAB;  Service: Cardiovascular;  Laterality: N/A;    FAMHx:  Family History  Problem Relation Age of Onset   Heart attack Mother    Hyperlipidemia Mother    Hypertension Mother    Cancer Father        lung   Diabetes Sister    Diabetes Brother    Asthma Daughter    Cancer Maternal Grandmother    Stroke Maternal Grandfather    Heart attack Paternal Grandfather     SOCHx:   reports that he has never smoked. He has never used smokeless tobacco. He reports current alcohol use. He reports current drug use. Frequency: 2.00 times per week. Drug: Cocaine.  ALLERGIES:  No Known Allergies  ROS: Pertinent items noted in HPI and remainder of comprehensive ROS otherwise negative.  HOME MEDS: Current Outpatient Medications on File Prior to Visit  Medication Sig Dispense Refill   allopurinol (ZYLOPRIM) 100 MG tablet Take 100 mg by mouth as needed (gout flare up).     ALPRAZolam (XANAX) 0.5 MG tablet TAKE 1 TABLET BY MOUTH 3 TIMES A DAY AS NEEDED FOR ANXIETY. 90 tablet 2    aspirin EC 81 MG tablet Take 1 tablet (81 mg total) by mouth daily. For heart attack prevention.     buPROPion (WELLBUTRIN XL) 300 MG 24 hr tablet Take 1 tablet (300 mg total) by mouth daily. 90 tablet 0   colchicine 0.6 MG tablet Take 0.6 mg by mouth daily as needed (gout).     HYDROcodone-acetaminophen (NORCO/VICODIN) 5-325 MG tablet Take 1 tablet by mouth every 6 (six) hours as needed for moderate pain.     isosorbide mononitrate (IMDUR) 60 MG 24 hr tablet Take 1 tablet (60 mg total) by mouth daily. 90 tablet 3   lisinopril (PRINIVIL,ZESTRIL) 10 MG tablet Take 1 tablet (10 mg total) by mouth daily. 90 tablet 0   metoprolol succinate (TOPROL XL) 25 MG 24 hr tablet Take 1 tablet (25 mg total) by mouth daily for 365 doses. 90 tablet 3   Multiple  Vitamins-Minerals (MULTIVITAMIN WITH MINERALS) tablet Take 1 tablet by mouth daily.     nitroGLYCERIN (NITROSTAT) 0.4 MG SL tablet Place 1 tablet (0.4 mg total) under the tongue every 5 (five) minutes as needed for chest pain. 25 tablet 3   omega-3 acid ethyl esters (LOVAZA) 1 g capsule Take 1 capsule (1 g total) by mouth 2 (two) times daily. 90 capsule 3   omeprazole (PRILOSEC) 20 MG capsule Take 1 capsule (20 mg total) by mouth 2 (two) times daily before a meal. 180 capsule 0   predniSONE (DELTASONE) 10 MG tablet Take by mouth as needed.     rosuvastatin (CRESTOR) 20 MG tablet Take 0.5 tablets (10 mg total) by mouth at bedtime. 30 tablet 0   valACYclovir (VALTREX) 500 MG tablet Take 500 mg by mouth 2 (two) times daily as needed (outbreak).      zolpidem (AMBIEN) 10 MG tablet Take 1 tablet (10 mg total) by mouth at bedtime. 30 tablet 2   No current facility-administered medications on file prior to visit.    LABS/IMAGING: No results found for this or any previous visit (from the past 48 hour(s)). No results found.  LIPID PANEL:    Component Value Date/Time   CHOL 173 09/04/2017 2338   CHOL 146 01/08/2015 0925   CHOL 141 08/06/2011 0327   TRIG  201 (H) 09/04/2017 2338   TRIG 375 (H) 08/06/2011 0327   HDL 39 (L) 09/04/2017 2338   HDL 42 01/08/2015 0925   HDL 25 (L) 08/06/2011 0327   CHOLHDL 4.4 09/04/2017 2338   VLDL 40 09/04/2017 2338   VLDL 75 (H) 08/06/2011 0327   LDLCALC 94 09/04/2017 2338   LDLCALC 76 01/08/2015 0925   LDLCALC 41 08/06/2011 0327     WEIGHTS: Wt Readings from Last 3 Encounters:  10/03/22 173 lb 9.6 oz (78.7 kg)  11/22/21 175 lb (79.4 kg)  11/11/21 171 lb (77.6 kg)    VITALS: BP 116/62   Pulse 78   Ht 5\' 5"  (1.651 m)   Wt 173 lb 9.6 oz (78.7 kg)   SpO2 96%   BMI 28.89 kg/m   EXAM: General appearance: alert and no distress Neck: no carotid bruit, no JVD and thyroid not enlarged, symmetric, no tenderness/mass/nodules Lungs: clear to auscultation bilaterally Heart: regular rate and rhythm, S1, S2 normal, no murmur, click, rub or gallop Abdomen: soft, non-tender; bowel sounds normal; no masses,  no organomegaly Extremities: extremities normal, atraumatic, no cyanosis or edema Pulses: 2+ and symmetric Skin: Skin color, texture, turgor normal. No rashes or lesions Neurologic: Grossly normal Psych: Pleasant  EKG:  Normal sinus rhythm at 78-personally reviewed  ASSESSMENT: Stable angina Coronary artery disease status post 5 vessel CABG in 2000 (LIMA to LAD, RIMA to RCA, SVG to OM1, diagonal and ramus) -unchanged anatomy by cardiac catheterization (11/2021) Low risk Myoview stress test in 08/2017 with LVEF 68% Cervical radiculopathy with anterior chest and left arm involvement Hypertension Dyslipidemia -elevated triglycerides CKD  PLAN: 1.   Billy Cisneros has been having chest pain which is likely stable angina.  He seems to be doing well on isosorbide for most activities but when he has to lift heavy items like car batteries he gets chest pain.  This seems to resolve either with rest or nitroglycerin.  Will continue with his current medications.  He did have recent lipid testing showing total  cholesterol 130, triglycerides 204, HDL 39 and LDL 50.  Creatinine is 1.6 which is stable.  Plan follow-up with  me annually or sooner as necessary.  Chrystie Nose, MD, [facility], FACP  [place]  [facility]  Medical Director of the [facility]  Cardiovascular Risk Reduction [facility] Diplomate of the American Board of Clinical Lipidology Attending Cardiologist  Direct Dial: [phone]  Fax: [phone]  Website:  [email or web address] 10/03/2022, 2:01 PM

## 2022-10-03 NOTE — ED Provider Notes (Signed)
Billy Cisneros    CSN: [id number] Arrival date & time: 10/03/22  1934      History   Chief Complaint Chief Complaint  Patient presents with   Finger Injury    HPI Billy Cisneros. is a 72 y.o. male.   HPI  Patient presents to urgent care with concern for laceration to left thumb which occurred about 1 hour ago when he cut it with an old saw.  Past Medical History:  Diagnosis Date   Anxiety    Coronary artery disease    Depression    GERD (gastroesophageal reflux disease)    Gout    Hypertension    Unstable angina (HCC) 11/11/2021    Patient Active Problem List   Diagnosis Date Noted   Unstable angina (HCC) 11/11/2021   Esophageal dysphagia    Esophageal obstruction due to food impaction    CKD (chronic kidney disease), stage III 09/05/2017   Acute gout 02/19/2016   GERD (gastroesophageal reflux disease) 01/21/2016   Pain and swelling of right wrist 08/13/2015   Productive cough 08/13/2015   Chronic right shoulder pain 06/12/2015   Cannot sleep 06/12/2015   Hyperlipidemia LDL goal <70 03/12/2015   Cervical radiculopathy 01/08/2015   Annual physical exam 01/01/2015   H/O coronary artery bypass surgery 12/11/2014   Essential hypertension 12/04/2014   Anxiety 12/04/2014   Chest pain 12/04/2014   Major depression 11/21/2011    Past Surgical History:  Procedure Laterality Date   CARDIAC CATHETERIZATION     CARDIAC SURGERY     COLONOSCOPY WITH PROPOFOL N/A 05/25/2017   Procedure: COLONOSCOPY WITH PROPOFOL;  Surgeon: Wyline Mood, MD;  Location: [facility] ENDOSCOPY;  Service: Gastroenterology;  Laterality: N/A;   CORONARY ARTERY BYPASS GRAFT     ESOPHAGOGASTRODUODENOSCOPY N/A 10/27/2017   Procedure: ESOPHAGOGASTRODUODENOSCOPY (EGD);  Surgeon: Toney Reil, MD;  Location: [facility] ENDOSCOPY;  Service: Gastroenterology;  Laterality: N/A;   ESOPHAGOGASTRODUODENOSCOPY (EGD) WITH PROPOFOL N/A 11/19/2017   Procedure: ESOPHAGOGASTRODUODENOSCOPY (EGD) WITH  PROPOFOL;  Surgeon: Toney Reil, MD;  Location: [facility] ENDOSCOPY;  Service: Gastroenterology;  Laterality: N/A;   LEFT HEART CATH AND CORS/GRAFTS ANGIOGRAPHY N/A 10/14/2019   Procedure: LEFT HEART CATH AND CORS/GRAFTS ANGIOGRAPHY;  Surgeon: Lyn Records, MD;  Location: [facility] INVASIVE CV LAB;  Service: Cardiovascular;  Laterality: N/A;   LEFT HEART CATH AND CORS/GRAFTS ANGIOGRAPHY N/A 11/11/2021   Procedure: LEFT HEART CATH AND CORS/GRAFTS ANGIOGRAPHY;  Surgeon: Yvonne Kendall, MD;  Location: [facility] INVASIVE CV LAB;  Service: Cardiovascular;  Laterality: N/A;       Home Medications    Prior to Admission medications   Medication Sig Start Date End Date Taking? Authorizing Provider  allopurinol (ZYLOPRIM) 100 MG tablet Take 100 mg by mouth as needed (gout flare up). 08/25/21   [provider]  ALPRAZolam (XANAX) 0.5 MG tablet TAKE 1 TABLET BY MOUTH 3 TIMES A DAY AS NEEDED FOR ANXIETY. 06/24/17   Ellyn Hack, MD  aspirin EC 81 MG tablet Take 1 tablet (81 mg total) by mouth daily. For heart attack prevention. 11/22/11   Tamala Julian, PA-C  buPROPion (WELLBUTRIN XL) 300 MG 24 hr tablet Take 1 tablet (300 mg total) by mouth daily. 06/24/17   Ellyn Hack, MD  colchicine 0.6 MG tablet Take 0.6 mg by mouth daily as needed (gout). 08/24/21   [provider]  HYDROcodone-acetaminophen (NORCO/VICODIN) 5-325 MG tablet Take 1 tablet by mouth every 6 (six) hours as needed for moderate pain.  12/29/17   [provider]  isosorbide mononitrate (IMDUR) 60 MG 24 hr tablet Take 1 tablet (60 mg total) by mouth daily. 11/13/21   Kroeger, Ovidio Kin., PA-C  lisinopril (PRINIVIL,ZESTRIL) 10 MG tablet Take 1 tablet (10 mg total) by mouth daily. 06/24/17   Ellyn Hack, MD  metoprolol succinate (TOPROL XL) 25 MG 24 hr tablet Take 1 tablet (25 mg total) by mouth daily for 365 doses. 10/03/22 10/03/23  Chrystie Nose, MD  Multiple Vitamins-Minerals (MULTIVITAMIN WITH MINERALS) tablet  Take 1 tablet by mouth daily.    [provider]  nitroGLYCERIN (NITROSTAT) 0.4 MG SL tablet Place 1 tablet (0.4 mg total) under the tongue every 5 (five) minutes as needed for chest pain. 08/08/22 11/06/22  Chrystie Nose, MD  omega-3 acid ethyl esters (LOVAZA) 1 g capsule Take 1 capsule (1 g total) by mouth 2 (two) times daily. 06/30/22   Hilty, Lisette Abu, MD  omeprazole (PRILOSEC) 20 MG capsule Take 1 capsule (20 mg total) by mouth 2 (two) times daily before a meal. 06/24/17   Ellyn Hack, MD  predniSONE (DELTASONE) 10 MG tablet Take by mouth as needed. 07/04/21   [provider]  rosuvastatin (CRESTOR) 20 MG tablet Take 1 tablet (20 mg total) by mouth at bedtime. 10/03/22   Hilty, Lisette Abu, MD  valACYclovir (VALTREX) 500 MG tablet Take 500 mg by mouth 2 (two) times daily as needed (outbreak).     [provider]  zolpidem (AMBIEN) 10 MG tablet Take 1 tablet (10 mg total) by mouth at bedtime. 06/24/17   Ellyn Hack, MD    Family History Family History  Problem Relation Age of Onset   Heart attack Mother    Hyperlipidemia Mother    Hypertension Mother    Cancer Father        lung   Diabetes Sister    Diabetes Brother    Asthma Daughter    Cancer Maternal Grandmother    Stroke Maternal Grandfather    Heart attack Paternal Grandfather     Social History Social History   Tobacco Use   Smoking status: Never   Smokeless tobacco: Never  Vaping Use   Vaping Use: Never used  Substance Use Topics   Alcohol use: Yes    Alcohol/week: 0.0 standard drinks of alcohol    Comment: 3 beer a month   Drug use: Yes    Frequency: 2.0 times per week    Types: Cocaine    Comment: last use 15-20 years per patient     Allergies   Patient has no known allergies.   Review of Systems Review of Systems   Physical Exam Triage Vital Signs ED Triage Vitals  Enc Vitals Group     BP      Pulse      Resp      Temp      Temp src      SpO2      Weight       Height      Head Circumference      Peak Flow      Pain Score      Pain Loc      Pain Edu?      Excl. in GC?    No data found.  Updated Vital Signs There were no vitals taken for this visit.  Visual Acuity Right Eye Distance:   Left Eye Distance:   Bilateral Distance:  Right Eye Near:   Left Eye Near:    Bilateral Near:     Physical Exam Vitals reviewed.  Constitutional:      Appearance: Normal appearance.  Skin:    General: Skin is warm and dry.  Neurological:     General: No focal deficit present.     Mental Status: He is alert and oriented to person, place, and time.  Psychiatric:        Mood and Affect: Mood normal.        Behavior: Behavior normal.      UC Treatments / Results  Labs (all labs ordered are listed, but only abnormal results are displayed) Labs Reviewed - No data to display  EKG   Radiology No results found.  Procedures Laceration Repair  Date/Time: 10/03/2022 8:10 PM  Performed by: Charma Igo, FNP Authorized by: Charma Igo, FNP   Consent:    Consent obtained:  Verbal   Consent given by:  Patient   Risks discussed:  Infection, pain and poor wound healing   Alternatives discussed:  No treatment Universal protocol:    Procedure explained and questions answered to patient or proxy's satisfaction: yes     Relevant documents present and verified: yes     Test results available: no     Imaging studies available: no     Required blood products, implants, devices, and special equipment available: no     Site/side marked: no     Immediately prior to procedure, a time out was called: yes     Patient identity confirmed:  Verbally with patient Anesthesia:    Anesthesia method:  Local infiltration   Local anesthetic:  Lidocaine 1% WITH epi Laceration details:    Location:  Finger   Finger location:  L thumb   Length (cm):  1.5   Depth (mm):  2 Pre-procedure details:    Preparation:  Patient was prepped and  draped in usual sterile fashion Exploration:    Limited defect created (wound extended): no     Hemostasis achieved with:  Direct pressure   Wound exploration: wound explored through full range of motion     Contaminated: no   Treatment:    Area cleansed with:  Povidone-iodine   Amount of cleaning:  Standard   Irrigation solution:  Sterile saline   Irrigation volume:  10   Irrigation method:  Syringe   Visualized foreign bodies/material removed: no     Debridement:  None   Undermining:  None   Scar revision: no   Skin repair:    Repair method:  Sutures   Suture size:  4-0   Suture technique:  Simple interrupted   Number of sutures:  5 Approximation:    Approximation:  Close Repair type:    Repair type:  Simple Post-procedure details:    Dressing:  Antibiotic ointment and adhesive bandage   Procedure completion:  Tolerated Comments:     Care instructions given to patient.  (including critical care time)  Medications Ordered in UC Medications - No data to display  Initial Impression / Assessment and Plan / UC Course  I have reviewed the triage vital signs and the nursing notes.  Pertinent labs & imaging results that were available during my care of the patient were reviewed by me and considered in my medical decision making (see chart for details).   Repair of laceration of L thumb 1.5 cm long, 2 mm deep.  See procedure note.   Final Clinical Impressions(s) /  UC Diagnoses   Final diagnoses:  None   Discharge Instructions   None    ED Prescriptions   None    PDMP not reviewed this encounter.   Charma Igo, [facility] 10/03/22 2011

## 2022-10-03 NOTE — ED Provider Notes (Signed)
photo   Charma Igo, [place] 10/03/22 2012

## 2022-10-03 NOTE — Patient Instructions (Signed)
Medication Instructions:  Your physician recommends that you continue on your current medications as directed. Please refer to the Current Medication list given to you today.  *If you need a refill on your cardiac medications before your next appointment, please call your pharmacy*   Lab Work: None If you have labs (blood work) drawn today and your tests are completely normal, you will receive your results only by: MyChart Message (if you have MyChart) OR A paper copy in the mail If you have any lab test that is abnormal or we need to change your treatment, we will call you to review the results.   Testing/Procedures: None   Follow-Up: At [facility], you and your health needs are our priority.  As part of our continuing mission to provide you with exceptional heart care, we have created designated Provider Care Teams.  These Care Teams include your primary Cardiologist (physician) and Advanced Practice Providers (APPs -  Physician Assistants and Nurse Practitioners) who all work together to provide you with the care you need, when you need it.  We recommend signing up for the patient portal called "MyChart".  Sign up information is provided on this After Visit Summary.  MyChart is used to connect with patients for Virtual Visits (Telemedicine).  Patients are able to view lab/test results, encounter notes, upcoming appointments, etc.  Non-urgent messages can be sent to your provider as well.   To learn more about what you can do with MyChart, go to [email or web address].    Your next appointment:   1 year(s)  Provider:   Mohan C Hilty, MD     Other Instructions    

## 2022-10-03 NOTE — Discharge Instructions (Addendum)
Keep sutures clean and dry for 3 days.  Do not allow them to become wet.  Inspect daily for signs of infection including drainage, swelling, worsening pain, redness.  After 3 days you may wash the wound gently with soap and water.  Do not soak.  Return after 7 to 10 days for suture removal. Follow up here or with your primary care provider if your symptoms are worsening or not improving.

## 2022-10-09 ENCOUNTER — Emergency Department (HOSPITAL_COMMUNITY): Payer: Medicare Other

## 2022-10-09 ENCOUNTER — Inpatient Hospital Stay (HOSPITAL_COMMUNITY)
Admission: EM | Admit: 2022-10-09 | Discharge: 2022-10-11 | DRG: 281 | Disposition: A | Discharge status: Home / Self Care | Payer: Medicare Other | Attending: Internal Medicine | Admitting: Internal Medicine

## 2022-10-09 ENCOUNTER — Other Ambulatory Visit: Payer: Self-pay

## 2022-10-09 ENCOUNTER — Encounter (HOSPITAL_COMMUNITY): Payer: Self-pay | Admitting: Physician Assistant

## 2022-10-09 DIAGNOSIS — Z825 Family history of asthma and other chronic lower respiratory diseases: Secondary | ICD-10-CM

## 2022-10-09 DIAGNOSIS — I10 Essential (primary) hypertension: Secondary | ICD-10-CM

## 2022-10-09 DIAGNOSIS — N1831 Chronic kidney disease, stage 3a: Secondary | ICD-10-CM

## 2022-10-09 DIAGNOSIS — K219 Gastro-esophageal reflux disease without esophagitis: Secondary | ICD-10-CM | POA: Diagnosis present

## 2022-10-09 DIAGNOSIS — R072 Precordial pain: Secondary | ICD-10-CM

## 2022-10-09 DIAGNOSIS — I214 Non-ST elevation (NSTEMI) myocardial infarction: Principal | ICD-10-CM

## 2022-10-09 DIAGNOSIS — E782 Mixed hyperlipidemia: Secondary | ICD-10-CM

## 2022-10-09 DIAGNOSIS — M542 Cervicalgia: Secondary | ICD-10-CM

## 2022-10-09 DIAGNOSIS — M4800 Spinal stenosis, site unspecified: Secondary | ICD-10-CM

## 2022-10-09 DIAGNOSIS — M5030 Other cervical disc degeneration, unspecified cervical region: Secondary | ICD-10-CM

## 2022-10-09 DIAGNOSIS — E876 Hypokalemia: Secondary | ICD-10-CM

## 2022-10-09 DIAGNOSIS — I129 Hypertensive chronic kidney disease with stage 1 through stage 4 chronic kidney disease, or unspecified chronic kidney disease: Secondary | ICD-10-CM | POA: Diagnosis present

## 2022-10-09 DIAGNOSIS — Z7982 Long term (current) use of aspirin: Secondary | ICD-10-CM

## 2022-10-09 DIAGNOSIS — N179 Acute kidney failure, unspecified: Secondary | ICD-10-CM | POA: Diagnosis present

## 2022-10-09 DIAGNOSIS — I2510 Atherosclerotic heart disease of native coronary artery without angina pectoris: Secondary | ICD-10-CM

## 2022-10-09 DIAGNOSIS — I25810 Atherosclerosis of coronary artery bypass graft(s) without angina pectoris: Secondary | ICD-10-CM | POA: Diagnosis present

## 2022-10-09 DIAGNOSIS — R7989 Other specified abnormal findings of blood chemistry: Principal | ICD-10-CM

## 2022-10-09 DIAGNOSIS — Z79899 Other long term (current) drug therapy: Secondary | ICD-10-CM

## 2022-10-09 DIAGNOSIS — E785 Hyperlipidemia, unspecified: Secondary | ICD-10-CM | POA: Diagnosis present

## 2022-10-09 DIAGNOSIS — M4802 Spinal stenosis, cervical region: Secondary | ICD-10-CM | POA: Diagnosis present

## 2022-10-09 DIAGNOSIS — M549 Dorsalgia, unspecified: Secondary | ICD-10-CM | POA: Diagnosis not present

## 2022-10-09 DIAGNOSIS — M109 Gout, unspecified: Secondary | ICD-10-CM | POA: Diagnosis present

## 2022-10-09 DIAGNOSIS — Z8249 Family history of ischemic heart disease and other diseases of the circulatory system: Secondary | ICD-10-CM

## 2022-10-09 DIAGNOSIS — Z833 Family history of diabetes mellitus: Secondary | ICD-10-CM

## 2022-10-09 DIAGNOSIS — Z823 Family history of stroke: Secondary | ICD-10-CM

## 2022-10-09 HISTORY — DX: Non-ST elevation (NSTEMI) myocardial infarction: I21.4

## 2022-10-09 LAB — TROPONIN I (HIGH SENSITIVITY)
Troponin I (High Sensitivity): 6 ng/L (ref <18)
Troponin I (High Sensitivity): 153 ng/L (ref <18)
Troponin I (High Sensitivity): 441 ng/L (ref <18)
Troponin I (High Sensitivity): 461 ng/L (ref <18)

## 2022-10-09 LAB — CBC
HCT: 41.2 % (ref 39.0–52.0)
Hemoglobin: 14 g/dL (ref 13.0–17.0)
MCH: 30.7 pg (ref 26.0–34.0)
MCHC: 34 g/dL (ref 30.0–36.0)
MCV: 90.4 fL (ref 80.0–100.0)
Platelets: 249 10*3/uL (ref 150–400)
RBC: 4.56 MIL/uL (ref 4.22–5.81)
RDW: 12.7 % (ref 11.5–15.5)
WBC: 9.7 10*3/uL (ref 4.0–10.5)
nRBC: 0 % (ref 0.0–0.2)

## 2022-10-09 LAB — BASIC METABOLIC PANEL
Anion gap: 13 (ref 5–15)
BUN: 11 mg/dL (ref 8–23)
CO2: 21 mmol/L — ABNORMAL LOW (ref 22–32)
Calcium: 8.9 mg/dL (ref 8.9–10.3)
Chloride: 102 mmol/L (ref 98–111)
Creatinine, Ser: 1.6 mg/dL — ABNORMAL HIGH (ref 0.61–1.24)
GFR, Estimated: 46 mL/min — ABNORMAL LOW (ref >60)
Glucose, Bld: 124 mg/dL — ABNORMAL HIGH (ref 70–99)
Potassium: 3.4 mmol/L — ABNORMAL LOW (ref 3.5–5.1)
Sodium: 136 mmol/L (ref 135–145)

## 2022-10-09 MED ORDER — HYDROCODONE-ACETAMINOPHEN 5-325 MG PO TABS
1.0000 | ORAL_TABLET | Freq: Four times a day (QID) | ORAL | Status: DC | PRN
  Administered 2022-10-09 – 2022-10-10 (×2): 1 via ORAL
  Filled 2022-10-09 (×2): qty 1

## 2022-10-09 MED ORDER — ACETAMINOPHEN 325 MG PO TABS: 650.0000 mg | ORAL_TABLET | ORAL | Status: DC | PRN

## 2022-10-09 MED ORDER — ONDANSETRON HCL 4 MG/2ML IJ SOLN
4.0000 mg | Freq: Four times a day (QID) | INTRAMUSCULAR | Status: DC | PRN
  Administered 2022-10-10: 4 mg via INTRAVENOUS
  Filled 2022-10-09: qty 2

## 2022-10-09 MED ORDER — NITROGLYCERIN 0.4 MG SL SUBL
0.4000 mg | SUBLINGUAL_TABLET | SUBLINGUAL | Status: DC | PRN
  Administered 2022-10-09 (×3): 0.4 mg via SUBLINGUAL
  Filled 2022-10-09 (×3): qty 1

## 2022-10-09 MED ORDER — SODIUM CHLORIDE 0.9% FLUSH
3.0000 mL | Freq: Two times a day (BID) | INTRAVENOUS | Status: DC
  Administered 2022-10-09 – 2022-10-10 (×3): 3 mL via INTRAVENOUS

## 2022-10-09 MED ORDER — ASPIRIN 81 MG PO TBEC
81.0000 mg | DELAYED_RELEASE_TABLET | Freq: Every day | ORAL | Status: DC
  Administered 2022-10-11: 81 mg via ORAL
  Filled 2022-10-09 (×2): qty 1

## 2022-10-09 MED ORDER — METHOCARBAMOL 500 MG PO TABS
500.0000 mg | ORAL_TABLET | Freq: Once | ORAL | Status: AC
  Administered 2022-10-09: 500 mg via ORAL
  Filled 2022-10-09: qty 1

## 2022-10-09 MED ORDER — POTASSIUM CHLORIDE CRYS ER 20 MEQ PO TBCR
40.0000 meq | EXTENDED_RELEASE_TABLET | Freq: Once | ORAL | Status: AC
  Administered 2022-10-09: 40 meq via ORAL
  Filled 2022-10-09: qty 2

## 2022-10-09 MED ORDER — ALUM & MAG HYDROXIDE-SIMETH 200-200-20 MG/5ML PO SUSP
30.0000 mL | Freq: Once | ORAL | Status: AC
  Administered 2022-10-09: 30 mL via ORAL
  Filled 2022-10-09: qty 30

## 2022-10-09 MED ORDER — SODIUM CHLORIDE 0.9 % WEIGHT BASED INFUSION
1.0000 mL/kg/h | INTRAVENOUS | Status: DC
  Administered 2022-10-09 – 2022-10-10 (×2): 1 mL/kg/h via INTRAVENOUS

## 2022-10-09 MED ORDER — ZOLPIDEM TARTRATE 5 MG PO TABS
10.0000 mg | ORAL_TABLET | Freq: Every day | ORAL | Status: DC
  Administered 2022-10-09 – 2022-10-10 (×2): 10 mg via ORAL
  Filled 2022-10-09 (×2): qty 2

## 2022-10-09 MED ORDER — ROSUVASTATIN CALCIUM 20 MG PO TABS
20.0000 mg | ORAL_TABLET | Freq: Every day | ORAL | Status: DC
  Administered 2022-10-09 – 2022-10-10 (×2): 20 mg via ORAL
  Filled 2022-10-09 (×2): qty 1

## 2022-10-09 MED ORDER — ALPRAZOLAM 0.5 MG PO TABS: 0.5000 mg | ORAL_TABLET | Freq: Three times a day (TID) | ORAL | Status: DC | PRN

## 2022-10-09 MED ORDER — HEPARIN BOLUS VIA INFUSION
4000.0000 [IU] | Freq: Once | INTRAVENOUS | Status: AC
  Administered 2022-10-09: 4000 [IU] via INTRAVENOUS
  Filled 2022-10-09: qty 4000

## 2022-10-09 MED ORDER — HEPARIN (PORCINE) 25000 UT/250ML-% IV SOLN
1100.0000 [IU]/h | INTRAVENOUS | Status: DC
  Administered 2022-10-09: 950 [IU]/h via INTRAVENOUS
  Administered 2022-10-10: 1100 [IU]/h via INTRAVENOUS
  Filled 2022-10-09 (×2): qty 250

## 2022-10-09 MED ORDER — ASPIRIN 81 MG PO CHEW
324.0000 mg | CHEWABLE_TABLET | ORAL | Status: AC
  Administered 2022-10-09: 324 mg via ORAL
  Filled 2022-10-09: qty 4

## 2022-10-09 MED ORDER — LISINOPRIL 10 MG PO TABS
10.0000 mg | ORAL_TABLET | Freq: Every day | ORAL | Status: DC
  Administered 2022-10-10 – 2022-10-11 (×2): 10 mg via ORAL
  Filled 2022-10-09 (×2): qty 1

## 2022-10-09 MED ORDER — ASPIRIN 81 MG PO CHEW
81.0000 mg | CHEWABLE_TABLET | ORAL | Status: AC
  Administered 2022-10-10: 81 mg via ORAL
  Filled 2022-10-09: qty 1

## 2022-10-09 MED ORDER — SODIUM CHLORIDE 0.9 % WEIGHT BASED INFUSION
3.0000 mL/kg/h | INTRAVENOUS | Status: AC
  Administered 2022-10-09: 3 mL/kg/h via INTRAVENOUS

## 2022-10-09 MED ORDER — FAMOTIDINE 20 MG PO TABS
20.0000 mg | ORAL_TABLET | Freq: Once | ORAL | Status: AC
  Administered 2022-10-09: 20 mg via ORAL
  Filled 2022-10-09: qty 1

## 2022-10-09 MED ORDER — ACETAMINOPHEN 500 MG PO TABS
1000.0000 mg | ORAL_TABLET | Freq: Once | ORAL | Status: AC
  Administered 2022-10-09: 1000 mg via ORAL
  Filled 2022-10-09: qty 2

## 2022-10-09 MED ORDER — SODIUM CHLORIDE 0.9% FLUSH: 3.0000 mL | INTRAVENOUS | Status: DC | PRN

## 2022-10-09 MED ORDER — SODIUM CHLORIDE 0.9 % IV SOLN: 250.0000 mL | INTRAVENOUS | Status: DC | PRN

## 2022-10-09 MED ORDER — ASPIRIN 300 MG RE SUPP: 300.0000 mg | RECTAL | Status: DC

## 2022-10-09 MED ORDER — PREDNISONE 20 MG PO TABS: ORAL_TABLET | ORAL | 0 refills | Status: DC

## 2022-10-09 MED ORDER — IBUPROFEN 400 MG PO TABS
400.0000 mg | ORAL_TABLET | Freq: Once | ORAL | Status: AC
  Administered 2022-10-09: 400 mg via ORAL
  Filled 2022-10-09: qty 1

## 2022-10-09 MED ORDER — BUPROPION HCL ER (XL) 150 MG PO TB24
300.0000 mg | ORAL_TABLET | Freq: Every day | ORAL | Status: DC
  Administered 2022-10-09 – 2022-10-11 (×3): 300 mg via ORAL
  Filled 2022-10-09 (×3): qty 2

## 2022-10-09 MED ORDER — METHOCARBAMOL 500 MG PO TABS
500.0000 mg | ORAL_TABLET | Freq: Three times a day (TID) | ORAL | Status: DC | PRN
  Administered 2022-10-09 – 2022-10-10 (×2): 500 mg via ORAL
  Filled 2022-10-09 (×2): qty 1

## 2022-10-09 MED ORDER — OMEGA-3-ACID ETHYL ESTERS 1 G PO CAPS
1.0000 g | ORAL_CAPSULE | Freq: Two times a day (BID) | ORAL | Status: DC
  Administered 2022-10-09 – 2022-10-11 (×4): 1 g via ORAL
  Filled 2022-10-09 (×4): qty 1

## 2022-10-09 MED ORDER — METOPROLOL SUCCINATE ER 25 MG PO TB24
25.0000 mg | ORAL_TABLET | Freq: Every day | ORAL | Status: DC
  Administered 2022-10-10 – 2022-10-11 (×2): 25 mg via ORAL
  Filled 2022-10-09 (×2): qty 1

## 2022-10-09 MED ORDER — ISOSORBIDE MONONITRATE ER 60 MG PO TB24
60.0000 mg | ORAL_TABLET | Freq: Every day | ORAL | Status: DC
  Administered 2022-10-10 – 2022-10-11 (×2): 60 mg via ORAL
  Filled 2022-10-09 (×2): qty 1

## 2022-10-09 NOTE — ED Notes (Signed)
Pt complains of increase chest pain, given a nitro SL

## 2022-10-09 NOTE — ED Provider Notes (Signed)
3:47 PM Care assumed from Dr. Riverside Blas.  At time of transfer of care, patient is awaiting on delta troponin.  If second opponent is reassuring, plan of care is discharge home.  4:44 PM Second troponin turned positive from 6-1 53.  Will call cardiology.  Patient reports he has been having chest discomfort on and off recently and since he has been in the emergency department actually started having some discomfort earlier.   Suspect some degree of NSTEMI given the positive troponin.  Will call cardiology for evaluation and admission.   Oluwaseyi Raffel, Canary Brim, MD 10/09/22 2337

## 2022-10-09 NOTE — ED Notes (Addendum)
ED TO INPATIENT HANDOFF REPORT  ED Nurse Name and Phone #: Doyne Micke/[phone]  S Name/Age/Gender Billy Cisneros. 72 y.o. male Room/Bed: 043C/043C  Code Status   Code Status: Full Code  Home/SNF/Other Home Patient oriented to: self, place, time, and situation Is this baseline? Yes   Triage Complete: Triage complete  Chief Complaint NSTEMI (non-ST elevated myocardial infarction) [facility]) [I21.4]  Triage Note Pt. Stated, I've had 2 bulging disc in my upper neck,and this morning it was unbearable. I fell yesterday evening and almost difficult to get up   Allergies No Known Allergies  Level of Care/Admitting Diagnosis ED Disposition     ED Disposition  Admit   Condition  --   Comment  Hospital Area: [facility] [[id number]]  Level of Care: Telemetry Cardiac [[id number]]  May place patient in observation at [facility] or [facility] if equivalent level of care is available:: No  Covid Evaluation: Asymptomatic - no recent exposure (last 10 days) testing not required  Diagnosis: NSTEMI (non-ST elevated myocardial infarction) [facility]) [[id number]]  Admitting Physician: Maisie Fus [[id number]]  Attending Physician: Chrystie Nose [phone]          B Medical/Surgery History Past Medical History:  Diagnosis Date   Anxiety    Coronary artery disease    Depression    GERD (gastroesophageal reflux disease)    Gout    Hypertension    NSTEMI (non-ST elevated myocardial infarction) (HCC) 10/09/2022   Unstable angina (HCC) 11/11/2021   Past Surgical History:  Procedure Laterality Date   CARDIAC CATHETERIZATION     CARDIAC SURGERY     COLONOSCOPY WITH PROPOFOL N/A 05/25/2017   Procedure: COLONOSCOPY WITH PROPOFOL;  Surgeon: Wyline Mood, MD;  Location: [facility] ENDOSCOPY;  Service: Gastroenterology;  Laterality: N/A;   CORONARY ARTERY BYPASS GRAFT     ESOPHAGOGASTRODUODENOSCOPY N/A 10/27/2017   Procedure: ESOPHAGOGASTRODUODENOSCOPY (EGD);  Surgeon: Toney Reil, MD;   Location: [facility] ENDOSCOPY;  Service: Gastroenterology;  Laterality: N/A;   ESOPHAGOGASTRODUODENOSCOPY (EGD) WITH PROPOFOL N/A 11/19/2017   Procedure: ESOPHAGOGASTRODUODENOSCOPY (EGD) WITH PROPOFOL;  Surgeon: Toney Reil, MD;  Location: [facility] ENDOSCOPY;  Service: Gastroenterology;  Laterality: N/A;   LEFT HEART CATH AND CORS/GRAFTS ANGIOGRAPHY N/A 10/14/2019   Procedure: LEFT HEART CATH AND CORS/GRAFTS ANGIOGRAPHY;  Surgeon: Lyn Records, MD;  Location: [facility] INVASIVE CV LAB;  Service: Cardiovascular;  Laterality: N/A;   LEFT HEART CATH AND CORS/GRAFTS ANGIOGRAPHY N/A 11/11/2021   Procedure: LEFT HEART CATH AND CORS/GRAFTS ANGIOGRAPHY;  Surgeon: Yvonne Kendall, MD;  Location: [facility] INVASIVE CV LAB;  Service: Cardiovascular;  Laterality: N/A;     A IV Location/Drains/Wounds Patient Lines/Drains/Airways Status     Active Line/Drains/Airways     Name Placement date Placement time Site Days   Peripheral IV 10/09/22 20 G Anterior;Right Forearm 10/09/22  0922  Forearm  less than 1            Intake/Output Last 24 hours No intake or output data in the 24 hours ending 10/09/22 1835  Labs/Imaging Results for orders placed or performed during the hospital encounter of 10/09/22 (from the past 48 hour(s))  CBC     Status: None   Collection Time: 10/09/22  1:26 PM  Result Value Ref Range   WBC 9.7 4.0 - 10.5 K/uL   RBC 4.56 4.22 - 5.81 MIL/uL   Hemoglobin 14.0 13.0 - 17.0 g/dL   HCT 14.7 82.9 - 56.2 %   MCV 90.4 80.0 - 100.0 fL   MCH  30.7 26.0 - 34.0 pg   MCHC 34.0 30.0 - 36.0 g/dL   RDW 16.1 09.6 - 04.5 %   Platelets 249 150 - 400 K/uL   nRBC 0.0 0.0 - 0.2 %    Comment: Performed at [facility], [address], [place], [place] [address]  Basic metabolic panel     Status: Abnormal   Collection Time: 10/09/22  1:26 PM  Result Value Ref Range   Sodium 136 135 - 145 mmol/L   Potassium 3.4 (L) 3.5 - 5.1 mmol/L   Chloride 102 98 - 111 mmol/L   CO2 21 (L) 22 - 32 mmol/L    Glucose, Bld 124 (H) 70 - 99 mg/dL    Comment: Glucose reference range applies only to samples taken after fasting for at least 8 hours.   BUN 11 8 - 23 mg/dL   Creatinine, Ser 1.91 (H) 0.61 - 1.24 mg/dL   Calcium 8.9 8.9 - 47.8 mg/dL   GFR, Estimated 46 (L) >60 mL/min    Comment: (NOTE) Calculated using the CKD-EPI Creatinine Equation (2021)    Anion gap 13 5 - 15    Comment: Performed at [facility], [address], [place], [place] [address]  Troponin I (High Sensitivity)     Status: None   Collection Time: 10/09/22  1:26 PM  Result Value Ref Range   Troponin I (High Sensitivity) 6 <18 ng/L    Comment: (NOTE) Elevated high sensitivity troponin I (hsTnI) values and significant  changes across serial measurements may suggest ACS but many other  chronic and acute conditions are known to elevate hsTnI results.  Refer to the "Links" section for chest pain algorithms and additional  guidance. Performed at [facility], [address], [place], [place] [address]   Troponin I (High Sensitivity)     Status: Abnormal   Collection Time: 10/09/22  2:58 PM  Result Value Ref Range   Troponin I (High Sensitivity) 153 (HH) <18 ng/L    Comment: CRITICAL RESULT CALLED TO, READ BACK BY AND VERIFIED WITH L.KOEPPLINGER,RN @1627  10/09/2022 VANG.J (NOTE) Elevated high sensitivity troponin I (hsTnI) values and significant  changes across serial measurements may suggest ACS but many other  chronic and acute conditions are known to elevate hsTnI results.  Refer to the "Links" section for chest pain algorithms and additional  guidance. Performed at [facility], [address], [place], [place] [address]    DG Chest 1 View  Result Date: 10/09/2022 CLINICAL DATA:  Chest pain EXAM: CHEST  1 VIEW COMPARISON:  X-ray 11/11/2021 FINDINGS: Status post median sternotomy. Normal cardiopericardial silhouette without edema. No consolidation, pneumothorax or effusion. Tenting of the right  hemidiaphragm is stable. Overlapping cardiac leads. IMPRESSION: Sternal wires.  No acute cardiopulmonary disease. Electronically Signed   By: Karen Kays M.D.   On: 10/09/2022 14:04   CT Cervical Spine Wo Contrast  Result Date: 10/09/2022 CLINICAL DATA:  Cervical radiculopathy, no red flags EXAM: CT CERVICAL SPINE WITHOUT CONTRAST TECHNIQUE: Multidetector CT imaging of the cervical spine was performed without intravenous contrast. Multiplanar CT image reconstructions were also generated. RADIATION DOSE REDUCTION: This exam was performed according to the departmental dose-optimization program which includes automated exposure control, adjustment of the mA and/or kV according to patient size and/or use of iterative reconstruction technique. COMPARISON:  None Available. FINDINGS: Alignment: Normal. Skull base and vertebrae: Trace anterolisthesis of C4 on C5. Soft tissues and spinal canal: No prevertebral fluid or swelling.  No visible canal hematoma. Disc levels: There is a central disc extrusion at C4-C5. There is likely moderate spinal canal narrowing at C4-C5. Severe bilateral neural foraminal stenosis at C3-C4. Upper chest: Negative. Other: None IMPRESSION: 1. No acute fracture or traumatic listhesis. 2. Central disc extrusion at C4-C5 with likely moderate spinal canal narrowing. Consider further evaluation with MRI. 3. Severe bilateral neural foraminal stenosis at C3-C4. Electronically Signed   By: Lorenza Cambridge M.D.   On: 10/09/2022 12:15    Pending Labs [employer] (From admission, onward)     Start     Ordered   Signed and Held  Basic metabolic panel  Tomorrow morning,   R        Signed and Held            Vitals/Pain Today's Vitals   10/09/22 1435 10/09/22 1705 10/09/22 1825 10/09/22 1827  BP: 120/80     Pulse: 94     Resp: 18     Temp: 99.1 F (37.3 C)   97.7 F (36.5 C)  TempSrc: Oral   Oral  SpO2: 95%     Weight:    78.7 kg  PainSc:  7  6      Isolation Precautions No  active isolations  Medications Medications  nitroGLYCERIN (NITROSTAT) SL tablet 0.4 mg (0.4 mg Sublingual Given 10/09/22 1711)  ibuprofen (ADVIL) tablet 400 mg (400 mg Oral Given 10/09/22 0926)  acetaminophen (TYLENOL) tablet 1,000 mg (1,000 mg Oral Given 10/09/22 0925)  methocarbamol (ROBAXIN) tablet 500 mg (500 mg Oral Given 10/09/22 0925)  alum & mag hydroxide-simeth (MAALOX/MYLANTA) 200-200-20 MG/5ML suspension 30 mL (30 mLs Oral Given 10/09/22 1329)  famotidine (PEPCID) tablet 20 mg (20 mg Oral Given 10/09/22 1328)  potassium chloride SA (KLOR-CON M) CR tablet 40 mEq (40 mEq Oral Given 10/09/22 1513)    Mobility walks     Focused Assessments    R Recommendations: See Admitting Provider Note  Report given to:   Additional Notes: Pt came in for neck pain due to bulging discs. He also has had some CP. He has a 20 G in the R FA. 1st trop was 9 and 2nd trop was 153. His K is 3.4. He's A&Ox4. He has heparin going at 950 units.

## 2022-10-09 NOTE — Progress Notes (Signed)
ANTICOAGULATION CONSULT NOTE - Initial Consult  Pharmacy Consult for heparin  Indication: chest pain/ACS  No Known Allergies  Patient Measurements:   Heparin Dosing Weight: 77.4 kg   Vital Signs: Temp: 97.7 F (36.5 C) (05/30 1827) Temp Source: Oral (05/30 1827) BP: 120/80 (05/30 1435) Pulse Rate: 94 (05/30 1435)  Labs: Recent Labs    10/09/22 1326 10/09/22 1458  HGB 14.0  --   HCT 41.2  --   PLT 249  --   CREATININE 1.60*  --   TROPONINIHS 6 153*    Estimated Creatinine Clearance: 41.6 mL/min (A) (by C-G formula based on SCr of 1.6 mg/dL (H)).   Medical History: Past Medical History:  Diagnosis Date   Anxiety    Coronary artery disease    Depression    GERD (gastroesophageal reflux disease)    Gout    Hypertension    NSTEMI (non-ST elevated myocardial infarction) (HCC) 10/09/2022   Unstable angina (HCC) 11/11/2021    Medications:  Not on anticoagulation prior to admission.   Assessment: Billy Cisneros is presenting today with chest pain. He has a history of 5 vessel CABG in 2000. Cardiac catheterization in 2011 showed patent graphs. More recent catheterization shows patent LIMA to distal LAD, patent RIMA to PDA, totally occluded distal left main, totally occluded ostial left circumflex and ostial LAD. Medical therapy was recommended. Today, his troponins are slightly elevated. Will initiate heparin infusion at 12.2 units/kg/hour.   Goal of Therapy:  Heparin level 0.3-0.7 units/ml Monitor platelets by anticoagulation protocol: Yes   Plan:  Give 4000 units bolus x 1 Start heparin infusion at 950 units/hr Check anti-Xa level in 8 hours and daily while on heparin Continue to monitor H&H and platelets  Blane Ohara, PharmD  PGY1 Pharmacy Resident

## 2022-10-09 NOTE — H&P (Signed)
Cardiology Admission History and Physical   Patient ID: Billy Cisneros. MRN: [id number]; DOB: 04-Oct-1950   Admission date: 10/09/2022  PCP:  Lynnea Ferrier, MD    [facility] Providers Cardiologist:  Chrystie Nose, MD        Chief Complaint:  Chest pain  Patient Profile:   Billy Colla. is a 72 y.o. male with CAD s/p 5v CABG in 2000 (LIMA-LAD, RIMA-RPDA, SVG-OM1, diagonal and ramus), hyperlipidemia, cervical spinal stenosis with neck pain and CKD stage III who is being seen 10/09/2022 for the evaluation of Dr. Rush Landmark.  History of Present Illness:   Mr. Hommerding is a pleasant 72 year old male with past medical history of CAD s/p 5v CABG in 2000 (LIMA-LAD, RIMA-RPDA, SVG-OM1, diagonal and ramus), hyperlipidemia, cervical spinal stenosis with neck pain and CKD stage III.  Cardiac catheterization in 2011 showed patent grafts.  Myoview in May 2021 showed EF 66%, small area of ischemia in the apical inferior segment.  Subsequent cardiac catheterization showed patent LIMA to distal LAD, patent RIMA to PDA, totally occluded distal left main, totally occluded ostial left circumflex and ostial LAD.  Medical therapy was recommended.  Last cardiac catheterization was performed on 11/11/2021 which showed patent LIMA to LAD with 30% ostial graft stenosis, widely patent RIMA-RPDA, patent SVG to ramus intermedius with chronic occlusion of jump graft to OM, chronically occluded SVG to diagonal.  Medical therapy was recommended.  Patient was recently seen by Dr. Rennis Golden on 10/03/2022 at which time he described exertional type of chest pain that was felt to be stable angina.  Medical therapy was recommended at the time.  He currently works part-time moving heavy batteries and coolant.  He has been having worsening chest discomfort with less and less exertion.  He presented to the hospital today for evaluation of neck pain.  He has been having neck pain for the past several months.  CT  cervical spine showed no acute fracture however does show moderate spinal stenosis around C4-C5 and a severe bilateral neuroforaminal stenosis at C3-C4.  He says his neck hurt every time he move his head.  While in the ED, he has some mild chest discomfort.  First troponin was 6, second troponin came back elevated at 153.  EKG showed no acute changes.  Significant blood work include creatinine of 1.6, potassium 3.4.  Normal CBC.  Chest x-ray was normal.  Cardiology service consulted for NSTEMI.   Past Medical History:  Diagnosis Date   Anxiety    Coronary artery disease    Depression    GERD (gastroesophageal reflux disease)    Gout    Hypertension    Unstable angina (HCC) 11/11/2021    Past Surgical History:  Procedure Laterality Date   CARDIAC CATHETERIZATION     CARDIAC SURGERY     COLONOSCOPY WITH PROPOFOL N/A 05/25/2017   Procedure: COLONOSCOPY WITH PROPOFOL;  Surgeon: Wyline Mood, MD;  Location: [facility] ENDOSCOPY;  Service: Gastroenterology;  Laterality: N/A;   CORONARY ARTERY BYPASS GRAFT     ESOPHAGOGASTRODUODENOSCOPY N/A 10/27/2017   Procedure: ESOPHAGOGASTRODUODENOSCOPY (EGD);  Surgeon: Toney Reil, MD;  Location: [facility] ENDOSCOPY;  Service: Gastroenterology;  Laterality: N/A;   ESOPHAGOGASTRODUODENOSCOPY (EGD) WITH PROPOFOL N/A 11/19/2017   Procedure: ESOPHAGOGASTRODUODENOSCOPY (EGD) WITH PROPOFOL;  Surgeon: Toney Reil, MD;  Location[facility];  Service: Gastroenterology;  Laterality: N/A;   LEFT HEART CATH AND CORS/GRAFTS ANGIOGRAPHY N/A 10/14/2019   Procedure: LEFT HEART CATH AND CORS/GRAFTS ANGIOGRAPHY;  Surgeon: Katrinka Blazing,  Barry Dienes, MD;  Location: [facility] INVASIVE CV LAB;  Service: Cardiovascular;  Laterality: N/A;   LEFT HEART CATH AND CORS/GRAFTS ANGIOGRAPHY N/A 11/11/2021   Procedure: LEFT HEART CATH AND CORS/GRAFTS ANGIOGRAPHY;  Surgeon: Yvonne Kendall, MD;  Location: [facility] INVASIVE CV LAB;  Service: Cardiovascular;  Laterality: N/A;     Medications Prior to  Admission: Prior to Admission medications   Medication Sig Start Date End Date Taking? Authorizing Provider  predniSONE (DELTASONE) 20 MG tablet 3 po once a day for 2 days, then 2 po once a day for 3 days, then 1 po once a day for 3 days 10/09/22  Yes Cathren Laine, MD  allopurinol (ZYLOPRIM) 100 MG tablet Take 100 mg by mouth as needed (gout flare up). 08/25/21   [provider]  ALPRAZolam (XANAX) 0.5 MG tablet TAKE 1 TABLET BY MOUTH 3 TIMES A DAY AS NEEDED FOR ANXIETY. 06/24/17   Ellyn Hack, MD  aspirin EC 81 MG tablet Take 1 tablet (81 mg total) by mouth daily. For heart attack prevention. 11/22/11   Tamala Julian, PA-C  buPROPion (WELLBUTRIN XL) 300 MG 24 hr tablet Take 1 tablet (300 mg total) by mouth daily. 06/24/17   Ellyn Hack, MD  colchicine 0.6 MG tablet Take 0.6 mg by mouth daily as needed (gout). 08/24/21   [provider]  HYDROcodone-acetaminophen (NORCO/VICODIN) 5-325 MG tablet Take 1 tablet by mouth every 6 (six) hours as needed for moderate pain. 12/29/17   [provider]  isosorbide mononitrate (IMDUR) 60 MG 24 hr tablet Take 1 tablet (60 mg total) by mouth daily. 11/13/21   Kroeger, Ovidio Kin., PA-C  lisinopril (PRINIVIL,ZESTRIL) 10 MG tablet Take 1 tablet (10 mg total) by mouth daily. 06/24/17   Ellyn Hack, MD  metoprolol succinate (TOPROL XL) 25 MG 24 hr tablet Take 1 tablet (25 mg total) by mouth daily for 365 doses. 10/03/22 10/03/23  Chrystie Nose, MD  Multiple Vitamins-Minerals (MULTIVITAMIN WITH MINERALS) tablet Take 1 tablet by mouth daily.    [provider]  nitroGLYCERIN (NITROSTAT) 0.4 MG SL tablet Place 1 tablet (0.4 mg total) under the tongue every 5 (five) minutes as needed for chest pain. 08/08/22 11/06/22  Chrystie Nose, MD  omega-3 acid ethyl esters (LOVAZA) 1 g capsule Take 1 capsule (1 g total) by mouth 2 (two) times daily. 06/30/22   Hilty, Lisette Abu, MD  omeprazole (PRILOSEC) 20 MG capsule Take 1 capsule (20  mg total) by mouth 2 (two) times daily before a meal. 06/24/17   Ellyn Hack, MD  predniSONE (DELTASONE) 10 MG tablet Take by mouth as needed. 07/04/21   [provider]  rosuvastatin (CRESTOR) 20 MG tablet Take 1 tablet (20 mg total) by mouth at bedtime. 10/03/22   Hilty, Lisette Abu, MD  valACYclovir (VALTREX) 500 MG tablet Take 500 mg by mouth 2 (two) times daily as needed (outbreak).     [provider]  zolpidem (AMBIEN) 10 MG tablet Take 1 tablet (10 mg total) by mouth at bedtime. 06/24/17   Ellyn Hack, MD     Allergies:   No Known Allergies  Social History:   Social History   Socioeconomic History   Marital status: Married    Spouse name: Not on file   Number of children: Not on file   Years of education: Not on file   Highest education level: Not on file  Occupational History   Not on  file  Tobacco Use   Smoking status: Never   Smokeless tobacco: Never  Vaping Use   Vaping Use: Never used  Substance and Sexual Activity   Alcohol use: Yes    Alcohol/week: 0.0 standard drinks of alcohol    Comment: 3 beer a month   Drug use: Yes    Frequency: 2.0 times per week    Types: Cocaine    Comment: last use 15-20 years per patient   Sexual activity: Yes    Birth control/protection: None  Other Topics Concern   Not on file  Social History Narrative   Not on file   Social Determinants of Health   Financial Resource Strain: Not on file  Food Insecurity: Not on file  Transportation Needs: Not on file  Physical Activity: Not on file  Stress: Not on file  Social Connections: Not on file  Intimate Partner Violence: Not on file    Family History:   The patient's family history includes Asthma in his daughter; Cancer in his father and maternal grandmother; Diabetes in his brother and sister; Heart attack in his mother and paternal grandfather; Hyperlipidemia in his mother; Hypertension in his mother; Stroke in his maternal grandfather.    ROS:   Please see the history of present illness.  All other ROS reviewed and negative.     Physical Exam/Data:   Vitals:   10/09/22 0913 10/09/22 1005 10/09/22 1015 10/09/22 1435  BP: 133/81 (!) 120/95 116/76 120/80  Pulse: (!) 106 95 94 94  Resp: 18 18  18   Temp: 99.3 F (37.4 C)   99.1 F (37.3 C)  TempSrc: Oral   Oral  SpO2: 98% 96% 95% 95%   No intake or output data in the 24 hours ending 10/09/22 1812    10/03/2022    1:45 PM 11/22/2021    8:27 AM 11/11/2021    1:37 PM  Last 3 Weights  Weight (lbs) 173 lb 9.6 oz 175 lb 171 lb  Weight (kg) 78.744 kg 79.379 kg 77.565 kg     There is no height or weight on file to calculate BMI.  General:  Well nourished, well developed, in no acute distress HEENT: normal Neck: no JVD Vascular: No carotid bruits; Distal pulses 2+ bilaterally   Cardiac:  normal S1, S2; RRR; no murmur  Lungs:  clear to auscultation bilaterally, no wheezing, rhonchi or rales  Abd: soft, nontender, no hepatomegaly  Ext: no edema Musculoskeletal:  No deformities, BUE and BLE strength normal and equal Skin: warm and dry  Neuro:  CNs 2-12 intact, no focal abnormalities noted Psych:  Normal affect    EKG:  The ECG that was done and was personally reviewed and demonstrates normal sinus rhythm, no significant ST-T wave changes  Relevant CV Studies:  Cath 11/11/2021 Conclusions: Severe native coronary artery disease including total/subtotal chronic occlusions of the ostial LAD, proximal ramus, and proximal LCx as well as severe diffuse RCA disease. Patent LIMA-LAD with 30% ostial graft stenosis. Widely patent RIMA-rPDA. Patent SVG-ramus intermedius with chronic occlusion of jump segment from ramus to OM. Chronically occluded SVG-D. Upper normal left ventricular filling pressure (LVEDP 15 mmHg).   Recommendations: Continue medical therapy, as angiographic appearance of coronary arteries and bypass grafts is very similar to 2021.  It is possible that running out of  isosorbide mononitrate may have brought on recurrent angina. Aggressive secondary prevention. Follow-up echocardiogram.  Laboratory Data:  High Sensitivity Troponin:   Recent Labs  Lab 10/09/22 1326 10/09/22 1458  TROPONINIHS 6 153*      Chemistry Recent Labs  Lab 10/09/22 1326  NA 136  K 3.4*  CL 102  CO2 21*  GLUCOSE 124*  BUN 11  CREATININE 1.60*  CALCIUM 8.9  GFRNONAA 46*  ANIONGAP 13    No results for input(s): "PROT", "ALBUMIN", "AST", "ALT", "ALKPHOS", "BILITOT" in the last 168 hours. Lipids No results for input(s): "CHOL", "TRIG", "HDL", "LABVLDL", "LDLCALC", "CHOLHDL" in the last 168 hours. Hematology Recent Labs  Lab 10/09/22 1326  WBC 9.7  RBC 4.56  HGB 14.0  HCT 41.2  MCV 90.4  MCH 30.7  MCHC 34.0  RDW 12.7  PLT 249   Thyroid No results for input(s): "TSH", "FREET4" in the last 168 hours. BNPNo results for input(s): "BNP", "PROBNP" in the last 168 hours.  DDimer No results for input(s): "DDIMER" in the last 168 hours.   Radiology/Studies:  DG Chest 1 View  Result Date: 10/09/2022 CLINICAL DATA:  Chest pain EXAM: CHEST  1 VIEW COMPARISON:  X-ray 11/11/2021 FINDINGS: Status post median sternotomy. Normal cardiopericardial silhouette without edema. No consolidation, pneumothorax or effusion. Tenting of the right hemidiaphragm is stable. Overlapping cardiac leads. IMPRESSION: Sternal wires.  No acute cardiopulmonary disease. Electronically Signed   By: Karen Kays M.D.   On: 10/09/2022 14:04   CT Cervical Spine Wo Contrast  Result Date: 10/09/2022 CLINICAL DATA:  Cervical radiculopathy, no red flags EXAM: CT CERVICAL SPINE WITHOUT CONTRAST TECHNIQUE: Multidetector CT imaging of the cervical spine was performed without intravenous contrast. Multiplanar CT image reconstructions were also generated. RADIATION DOSE REDUCTION: This exam was performed according to the departmental dose-optimization program which includes automated exposure control,  adjustment of the mA and/or kV according to patient size and/or use of iterative reconstruction technique. COMPARISON:  None Available. FINDINGS: Alignment: Normal. Skull base and vertebrae: Trace anterolisthesis of C4 on C5. Soft tissues and spinal canal: No prevertebral fluid or swelling. No visible canal hematoma. Disc levels: There is a central disc extrusion at C4-C5. There is likely moderate spinal canal narrowing at C4-C5. Severe bilateral neural foraminal stenosis at C3-C4. Upper chest: Negative. Other: None IMPRESSION: 1. No acute fracture or traumatic listhesis. 2. Central disc extrusion at C4-C5 with likely moderate spinal canal narrowing. Consider further evaluation with MRI. 3. Severe bilateral neural foraminal stenosis at C3-C4. Electronically Signed   By: Lorenza Cambridge M.D.   On: 10/09/2022 12:15     Assessment and Plan:   NSTEMI  -Patient has been having increasing anginal symptoms with less exertion.  -Will admit overnight for observation.  Trend serial troponin.  Obtain echocardiogram.  -Plan for left heart cath tomorrow.  Patient says he typically gets cath via femoral approach.  -Risk and benefit of procedure explained to the patient who display clear understanding and agree to proceed.  Discussed with patient possible procedural risk include bleeding, vascular injury, renal injury, arrythmia, MI, stroke and loss of limb or life.  -He is a full code  CAD s/p 5v CABG in 2000 (LIMA-LAD, RIMA-RPDA, SVG-OM1, diagonal and ramus): Last cardiac catheterization on 11/11/2021  Hyperlipidemia: On rosuvastatin  Cervical spinal stenosis with neck pain: Neck pain is unlikely to be cardiac in nature.  Worse when he moves his neck.  Issue has been going on for more than 6 months.  CKD stage III: Hydrate overnight for cath tomorrow.   Risk Assessment/Risk Scores:    TIMI Risk Score for Unstable Angina or Non-ST Elevation MI:   The patient's TIMI risk score is  6, which indicates a 41% risk  of all cause mortality, new or recurrent myocardial infarction or need for urgent revascularization in the next 14 days.       Severity of Illness: The appropriate patient status for this patient is OBSERVATION. Observation status is judged to be reasonable and necessary in order to provide the required intensity of service to ensure the patient's safety. The patient's presenting symptoms, physical exam findings, and initial radiographic and laboratory data in the context of their medical condition is felt to place them at decreased risk for further clinical deterioration. Furthermore, it is anticipated that the patient will be medically stable for discharge from the hospital within 2 midnights of admission.    For questions or updates, please contact [facility] Please consult [email or web address] for contact info under     Ramond Dial, [place]  10/09/2022 6:12 PM

## 2022-10-09 NOTE — ED Triage Notes (Signed)
Pt. Stated, I've had 2 bulging disc in my upper neck,and this morning it was unbearable. I fell yesterday evening and almost difficult to get up

## 2022-10-09 NOTE — ED Notes (Signed)
Pt states that he is having stiffness in neck, especially on the left side.

## 2022-10-09 NOTE — ED Provider Notes (Signed)
[place] EMERGENCY DEPARTMENT AT [facility] Provider Note   CSN: [id number] Arrival date & time: 10/09/22  5956     History  Chief Complaint  Patient presents with   Neck Pain   Back Pain    Billy Cisneros. is a 72 y.o. male.  Pt c/o left neck pain in past 1-2 days. States hx ddd, and feels same as prior pain. Denies specific injury or trauma to area. Pain left neck, worse w certain movements of neck. No pain down arm. No numbness/weakness or loss of normal functional ability. Has not taken anything for pain yet today. Denies chest pain or discomfort. No sob or unusual doe. Denies fever or chills.   The history is provided by the patient and medical records.  Neck Pain Associated symptoms: no chest pain, no fever, no headaches, no numbness and no weakness   Back Pain Associated symptoms: no abdominal pain, no chest pain, no fever, no headaches, no numbness and no weakness        Home Medications Prior to Admission medications   Medication Sig Start Date End Date Taking? Authorizing Provider  allopurinol (ZYLOPRIM) 100 MG tablet Take 100 mg by mouth as needed (gout flare up). 08/25/21   [provider]  ALPRAZolam (XANAX) 0.5 MG tablet TAKE 1 TABLET BY MOUTH 3 TIMES A DAY AS NEEDED FOR ANXIETY. 06/24/17   Ellyn Hack, MD  aspirin EC 81 MG tablet Take 1 tablet (81 mg total) by mouth daily. For heart attack prevention. 11/22/11   Tamala Julian, PA-C  buPROPion (WELLBUTRIN XL) 300 MG 24 hr tablet Take 1 tablet (300 mg total) by mouth daily. 06/24/17   Ellyn Hack, MD  colchicine 0.6 MG tablet Take 0.6 mg by mouth daily as needed (gout). 08/24/21   [provider]  HYDROcodone-acetaminophen (NORCO/VICODIN) 5-325 MG tablet Take 1 tablet by mouth every 6 (six) hours as needed for moderate pain. 12/29/17   [provider]  isosorbide mononitrate (IMDUR) 60 MG 24 hr tablet Take 1 tablet (60 mg total) by mouth daily. 11/13/21   Kroeger,  Ovidio Kin., PA-C  lisinopril (PRINIVIL,ZESTRIL) 10 MG tablet Take 1 tablet (10 mg total) by mouth daily. 06/24/17   Ellyn Hack, MD  metoprolol succinate (TOPROL XL) 25 MG 24 hr tablet Take 1 tablet (25 mg total) by mouth daily for 365 doses. 10/03/22 10/03/23  Chrystie Nose, MD  Multiple Vitamins-Minerals (MULTIVITAMIN WITH MINERALS) tablet Take 1 tablet by mouth daily.    [provider]  nitroGLYCERIN (NITROSTAT) 0.4 MG SL tablet Place 1 tablet (0.4 mg total) under the tongue every 5 (five) minutes as needed for chest pain. 08/08/22 11/06/22  Chrystie Nose, MD  omega-3 acid ethyl esters (LOVAZA) 1 g capsule Take 1 capsule (1 g total) by mouth 2 (two) times daily. 06/30/22   Hilty, Lisette Abu, MD  omeprazole (PRILOSEC) 20 MG capsule Take 1 capsule (20 mg total) by mouth 2 (two) times daily before a meal. 06/24/17   Ellyn Hack, MD  predniSONE (DELTASONE) 10 MG tablet Take by mouth as needed. 07/04/21   [provider]  rosuvastatin (CRESTOR) 20 MG tablet Take 1 tablet (20 mg total) by mouth at bedtime. 10/03/22   Hilty, Lisette Abu, MD  valACYclovir (VALTREX) 500 MG tablet Take 500 mg by mouth 2 (two) times daily as needed (outbreak).     [provider]  zolpidem (AMBIEN) 10 MG tablet  Take 1 tablet (10 mg total) by mouth at bedtime. 06/24/17   Ellyn Hack, MD      Allergies    Patient has no known allergies.    Review of Systems   Review of Systems  Constitutional:  Negative for chills and fever.  HENT:  Negative for sore throat.   Respiratory:  Negative for shortness of breath.   Cardiovascular:  Negative for chest pain.  Gastrointestinal:  Negative for abdominal pain and nausea.  Genitourinary:  Negative for flank pain.  Musculoskeletal:  Positive for neck pain.  Skin:  Negative for rash.  Neurological:  Negative for weakness, numbness and headaches.    Physical Exam Updated Vital Signs BP 116/76   Pulse 94   Temp 99.3 F (37.4 C) (Oral)    Resp 18   SpO2 95%  Physical Exam Vitals and nursing note reviewed.  Constitutional:      Appearance: Normal appearance. He is well-developed.  HENT:     Head: Atraumatic.     Nose: Nose normal.     Mouth/Throat:     Mouth: Mucous membranes are moist.  Eyes:     General: No scleral icterus.    Conjunctiva/sclera: Conjunctivae normal.  Neck:     Trachea: No tracheal deviation.     Comments: No stiffness or rigidity. Left neck and left trapezius muscular tenderness. No sts noted. No skin changes, lesions or rash to area of pain.  Cardiovascular:     Rate and Rhythm: Normal rate and regular rhythm.     Pulses: Normal pulses.     Heart sounds: Normal heart sounds. No murmur heard.    No friction rub. No gallop.  Pulmonary:     Effort: Pulmonary effort is normal. No accessory muscle usage or respiratory distress.     Breath sounds: Normal breath sounds.  Abdominal:     General: There is no distension.     Palpations: Abdomen is soft.     Tenderness: There is no abdominal tenderness.  Musculoskeletal:        General: No swelling.     Cervical back: Normal range of motion and neck supple. No rigidity.     Comments: C/T spine non tender, aligned. Left neck/trapezius muscular tenderness reproducing symptoms.   Skin:    General: Skin is warm and dry.     Findings: No rash.  Neurological:     Mental Status: He is alert.     Comments: Alert, speech clear. Motor/sens grossly intact bil. LUE str 5/5. Steady gait.   Psychiatric:        Mood and Affect: Mood normal.     ED Results / Procedures / Treatments   Labs (all labs ordered are listed, but only abnormal results are displayed) Results for orders placed or performed during the hospital encounter of 11/11/21  CBC  Result Value Ref Range   WBC 7.0 4.0 - 10.5 K/uL   RBC 4.83 4.22 - 5.81 MIL/uL   Hemoglobin 14.8 13.0 - 17.0 g/dL   HCT 16.1 09.6 - 04.5 %   MCV 91.3 80.0 - 100.0 fL   MCH 30.6 26.0 - 34.0 pg   MCHC 33.6 30.0 -  36.0 g/dL   RDW 40.9 81.1 - 91.4 %   Platelets 245 150 - 400 K/uL   nRBC 0.0 0.0 - 0.2 %  Comprehensive metabolic panel  Result Value Ref Range   Sodium 139 135 - 145 mmol/L   Potassium 4.9 3.5 - 5.1 mmol/L  Chloride 105 98 - 111 mmol/L   CO2 24 22 - 32 mmol/L   Glucose, Bld 113 (H) 70 - 99 mg/dL   BUN 20 8 - 23 mg/dL   Creatinine, Ser 1.61 (H) 0.61 - 1.24 mg/dL   Calcium 9.6 8.9 - 09.6 mg/dL   Total Protein 6.3 (L) 6.5 - 8.1 g/dL   Albumin 3.7 3.5 - 5.0 g/dL   AST 25 15 - 41 U/L   ALT 26 0 - 44 U/L   Alkaline Phosphatase 83 38 - 126 U/L   Total Bilirubin 0.7 0.3 - 1.2 mg/dL   GFR, Estimated 48 (L) >60 mL/min   Anion gap 10 5 - 15  CBC  Result Value Ref Range   WBC 6.5 4.0 - 10.5 K/uL   RBC 4.76 4.22 - 5.81 MIL/uL   Hemoglobin 14.9 13.0 - 17.0 g/dL   HCT 04.5 40.9 - 81.1 %   MCV 90.3 80.0 - 100.0 fL   MCH 31.3 26.0 - 34.0 pg   MCHC 34.7 30.0 - 36.0 g/dL   RDW 91.4 78.2 - 95.6 %   Platelets 244 150 - 400 K/uL   nRBC 0.0 0.0 - 0.2 %  CBC  Result Value Ref Range   WBC 7.6 4.0 - 10.5 K/uL   RBC 4.59 4.22 - 5.81 MIL/uL   Hemoglobin 14.3 13.0 - 17.0 g/dL   HCT 21.3 08.6 - 57.8 %   MCV 89.8 80.0 - 100.0 fL   MCH 31.2 26.0 - 34.0 pg   MCHC 34.7 30.0 - 36.0 g/dL   RDW 46.9 62.9 - 52.8 %   Platelets 220 150 - 400 K/uL   nRBC 0.0 0.0 - 0.2 %  Basic metabolic panel  Result Value Ref Range   Sodium 140 135 - 145 mmol/L   Potassium 4.1 3.5 - 5.1 mmol/L   Chloride 109 98 - 111 mmol/L   CO2 23 22 - 32 mmol/L   Glucose, Bld 100 (H) 70 - 99 mg/dL   BUN 22 8 - 23 mg/dL   Creatinine, Ser 4.13 (H) 0.61 - 1.24 mg/dL   Calcium 8.9 8.9 - 24.4 mg/dL   GFR, Estimated 43 (L) >60 mL/min   Anion gap 8 5 - 15  Lipoprotein A (LPA)  Result Value Ref Range   Lipoprotein (a) 81.1 (H) <75.0 nmol/L  I-Stat Chem 8, ED  Result Value Ref Range   Sodium 139 135 - 145 mmol/L   Potassium 4.8 3.5 - 5.1 mmol/L   Chloride 106 98 - 111 mmol/L   BUN 22 8 - 23 mg/dL   Creatinine, Ser 0.10 (H)  0.61 - 1.24 mg/dL   Glucose, Bld 272 (H) 70 - 99 mg/dL   Calcium, Ion 5.36 (L) 1.15 - 1.40 mmol/L   TCO2 24 22 - 32 mmol/L   Hemoglobin 14.3 13.0 - 17.0 g/dL   HCT 64.4 03.4 - 74.2 %  Troponin I (High Sensitivity)  Result Value Ref Range   Troponin I (High Sensitivity) 6 <18 ng/L  Troponin I (High Sensitivity)  Result Value Ref Range   Troponin I (High Sensitivity) 13 <18 ng/L   CT Cervical Spine Wo Contrast  Result Date: 10/09/2022 CLINICAL DATA:  Cervical radiculopathy, no red flags EXAM: CT CERVICAL SPINE WITHOUT CONTRAST TECHNIQUE: Multidetector CT imaging of the cervical spine was performed without intravenous contrast. Multiplanar CT image reconstructions were also generated. RADIATION DOSE REDUCTION: This exam was performed according to the departmental dose-optimization program which includes automated exposure control,  adjustment of the mA and/or kV according to patient size and/or use of iterative reconstruction technique. COMPARISON:  None Available. FINDINGS: Alignment: Normal. Skull base and vertebrae: Trace anterolisthesis of C4 on C5. Soft tissues and spinal canal: No prevertebral fluid or swelling. No visible canal hematoma. Disc levels: There is a central disc extrusion at C4-C5. There is likely moderate spinal canal narrowing at C4-C5. Severe bilateral neural foraminal stenosis at C3-C4. Upper chest: Negative. Other: None IMPRESSION: 1. No acute fracture or traumatic listhesis. 2. Central disc extrusion at C4-C5 with likely moderate spinal canal narrowing. Consider further evaluation with MRI. 3. Severe bilateral neural foraminal stenosis at C3-C4. Electronically Signed   By: Lorenza Cambridge M.D.   On: 10/09/2022 12:15     EKG EKG Interpretation  Date/Time:  Thursday Oct 09 2022 13:27:13 EDT Ventricular Rate:  77 PR Interval:  156 QRS Duration: 82 QT Interval:  368 QTC Calculation: 416 R Axis:   36 Text Interpretation: Normal sinus rhythm Nonspecific ST abnormality  Confirmed by Cathren Laine ([id number]) on 10/09/2022 1:31:43 PM  Radiology CT Cervical Spine Wo Contrast  Result Date: 10/09/2022 CLINICAL DATA:  Cervical radiculopathy, no red flags EXAM: CT CERVICAL SPINE WITHOUT CONTRAST TECHNIQUE: Multidetector CT imaging of the cervical spine was performed without intravenous contrast. Multiplanar CT image reconstructions were also generated. RADIATION DOSE REDUCTION: This exam was performed according to the departmental dose-optimization program which includes automated exposure control, adjustment of the mA and/or kV according to patient size and/or use of iterative reconstruction technique. COMPARISON:  None Available. FINDINGS: Alignment: Normal. Skull base and vertebrae: Trace anterolisthesis of C4 on C5. Soft tissues and spinal canal: No prevertebral fluid or swelling. No visible canal hematoma. Disc levels: There is a central disc extrusion at C4-C5. There is likely moderate spinal canal narrowing at C4-C5. Severe bilateral neural foraminal stenosis at C3-C4. Upper chest: Negative. Other: None IMPRESSION: 1. No acute fracture or traumatic listhesis. 2. Central disc extrusion at C4-C5 with likely moderate spinal canal narrowing. Consider further evaluation with MRI. 3. Severe bilateral neural foraminal stenosis at C3-C4. Electronically Signed   By: Lorenza Cambridge M.D.   On: 10/09/2022 12:15    Procedures Procedures    Medications Ordered in ED Medications  nitroGLYCERIN (NITROSTAT) SL tablet 0.4 mg (0.4 mg Sublingual Given 10/09/22 1331)  ibuprofen (ADVIL) tablet 400 mg (400 mg Oral Given 10/09/22 0926)  acetaminophen (TYLENOL) tablet 1,000 mg (1,000 mg Oral Given 10/09/22 0925)  methocarbamol (ROBAXIN) tablet 500 mg (500 mg Oral Given 10/09/22 0925)  alum & mag hydroxide-simeth (MAALOX/MYLANTA) 200-200-20 MG/5ML suspension 30 mL (30 mLs Oral Given 10/09/22 1329)  famotidine (PEPCID) tablet 20 mg (20 mg Oral Given 10/09/22 1328)    ED Course/ Medical Decision  Making/ A&P                             Medical Decision Making Problems Addressed: DDD (degenerative disc disease), cervical: chronic illness or injury with exacerbation, progression, or side effects of treatment that poses a threat to life or bodily functions Neck pain on left side: acute illness or injury Precordial chest pain: acute illness or injury with systemic symptoms that poses a threat to life or bodily functions Spinal stenosis, unspecified spinal region: chronic illness or injury  Amount and/or Complexity of Data Reviewed Independent Historian: spouse    Details: hx External Data Reviewed: radiology and notes. Labs: ordered. Decision-making details documented in ED Course. Radiology: ordered and independent  interpretation performed. Decision-making details documented in ED Course. ECG/medicine tests: ordered and independent interpretation performed. Decision-making details documented in ED Course.  Risk OTC drugs. Prescription drug management. Decision regarding hospitalization.   Reviewed nursing notes and prior charts for additional history.   No meds prior to arrival today for pain.   Acetaminophen po, ibuprofen po. Robaxin po.  Exam and symptoms appear most c/w musculoskeletal pain.   Family requests imaging - ordered.  On recheck, neck pain improved w meds but now patient also c/o mid chest pain. Dull pain, similar to prior, at rest, non radiating, not pleuritic. No associated sob, nv or diaphoresis. Notes hx cad, cabg 2000, and indicates on and off chest pain in past several weeks without acute/abrupt change in frequency or severity - states recently saw cardiology for same. Asking for ntg - ntg sl provided. Ecg and additional labs ordered. Pcxr. Chest cta. Rrr. Abd soft nt.   Iv ns. Continuous pulse ox and cardiac monitoring. Labs ordered/sent. Imaging ordered.   Differential diagnosis includes neck pain, ddd, ACS, MSK CP, GI CP, etc. Dispo decision including  potential need for admission considered - will get labs and imaging and reassess.   Reviewed nursing notes and prior charts for additional history. External reports reviewed. Additional history from: family.   Cardiac monitor: sinus rhythm, rate 77.  Labs reviewed/interpreted by me - initial trop normal.   Xrays reviewed/interpreted by me - no pna.   CT reviewed/interpreted by me - ddd.   1530, delta trop pending - signed out to Dr Rush Landmark to checking pending labs and dispo appropriately.             Final Clinical Impression(s) / ED Diagnoses Final diagnoses:  None    Rx / DC Orders ED Discharge Orders     None         Cathren Laine, MD 10/09/22 1539

## 2022-10-09 NOTE — Discharge Instructions (Addendum)
It was our pleasure to provide your ER care today - we hope that you feel better.  Your neck scan was read as showing:  1. No acute fracture or traumatic listhesis. 2. Central disc extrusion at C4-C5 with likely moderate spinal canal narrowing. 3. Severe bilateral neural foraminal stenosis at C3-C4.   Take prednisone as prescribed. Take acetaminophen as need for pain. You may also take robaxin as need for muscle pain/spasm - no driving when taking.  Follow up closely with neck specialist in the next 1-2 weeks - call office to arrange appointment.  For chest discomfort, follow up closely with cardiologist in the coming week - a referral was made.   Your potassium level is slightly low - eat plenty of fruits and vegetables, and follow up with your doctor.   Return to ER if worse, new symptoms, fevers, intractable pain, arm numbness/weakness, recurrent or persistent chest pain, increased trouble breathing, or other concern.

## 2022-10-09 NOTE — ED Notes (Signed)
Critical troponin 153 given face to face with Dr. Rush Landmark.

## 2022-10-10 ENCOUNTER — Encounter (HOSPITAL_COMMUNITY)
Admission: EM | Disposition: A | Discharge status: Home / Self Care | Payer: Self-pay | Source: Home / Self Care | Attending: Internal Medicine

## 2022-10-10 ENCOUNTER — Other Ambulatory Visit (HOSPITAL_COMMUNITY): Payer: Self-pay

## 2022-10-10 ENCOUNTER — Observation Stay (HOSPITAL_BASED_OUTPATIENT_CLINIC_OR_DEPARTMENT_OTHER): Payer: Medicare Other

## 2022-10-10 DIAGNOSIS — Z825 Family history of asthma and other chronic lower respiratory diseases: Secondary | ICD-10-CM | POA: Diagnosis not present

## 2022-10-10 DIAGNOSIS — I2510 Atherosclerotic heart disease of native coronary artery without angina pectoris: Secondary | ICD-10-CM

## 2022-10-10 DIAGNOSIS — R079 Chest pain, unspecified: Secondary | ICD-10-CM

## 2022-10-10 DIAGNOSIS — I25810 Atherosclerosis of coronary artery bypass graft(s) without angina pectoris: Secondary | ICD-10-CM | POA: Diagnosis present

## 2022-10-10 DIAGNOSIS — M549 Dorsalgia, unspecified: Secondary | ICD-10-CM | POA: Diagnosis present

## 2022-10-10 DIAGNOSIS — Z8249 Family history of ischemic heart disease and other diseases of the circulatory system: Secondary | ICD-10-CM | POA: Diagnosis not present

## 2022-10-10 DIAGNOSIS — I214 Non-ST elevation (NSTEMI) myocardial infarction: Secondary | ICD-10-CM | POA: Diagnosis present

## 2022-10-10 DIAGNOSIS — N179 Acute kidney failure, unspecified: Secondary | ICD-10-CM | POA: Diagnosis present

## 2022-10-10 DIAGNOSIS — E785 Hyperlipidemia, unspecified: Secondary | ICD-10-CM | POA: Diagnosis present

## 2022-10-10 DIAGNOSIS — N1831 Chronic kidney disease, stage 3a: Secondary | ICD-10-CM | POA: Diagnosis present

## 2022-10-10 DIAGNOSIS — Z833 Family history of diabetes mellitus: Secondary | ICD-10-CM | POA: Diagnosis not present

## 2022-10-10 DIAGNOSIS — M109 Gout, unspecified: Secondary | ICD-10-CM | POA: Diagnosis present

## 2022-10-10 DIAGNOSIS — M542 Cervicalgia: Secondary | ICD-10-CM | POA: Diagnosis not present

## 2022-10-10 DIAGNOSIS — Z7982 Long term (current) use of aspirin: Secondary | ICD-10-CM | POA: Diagnosis not present

## 2022-10-10 DIAGNOSIS — M5030 Other cervical disc degeneration, unspecified cervical region: Secondary | ICD-10-CM | POA: Diagnosis present

## 2022-10-10 DIAGNOSIS — I129 Hypertensive chronic kidney disease with stage 1 through stage 4 chronic kidney disease, or unspecified chronic kidney disease: Secondary | ICD-10-CM | POA: Diagnosis present

## 2022-10-10 DIAGNOSIS — K219 Gastro-esophageal reflux disease without esophagitis: Secondary | ICD-10-CM | POA: Diagnosis present

## 2022-10-10 DIAGNOSIS — Z79899 Other long term (current) drug therapy: Secondary | ICD-10-CM | POA: Diagnosis not present

## 2022-10-10 DIAGNOSIS — M4802 Spinal stenosis, cervical region: Secondary | ICD-10-CM | POA: Diagnosis present

## 2022-10-10 DIAGNOSIS — Z823 Family history of stroke: Secondary | ICD-10-CM | POA: Diagnosis not present

## 2022-10-10 HISTORY — PX: LEFT HEART CATH AND CORS/GRAFTS ANGIOGRAPHY: CATH118250

## 2022-10-10 LAB — LIPID PANEL
Cholesterol: 130 mg/dL (ref 0–200)
HDL: 43 mg/dL (ref >40)
LDL Cholesterol: 59 mg/dL (ref 0–99)
Total CHOL/HDL Ratio: 3 RATIO
Triglycerides: 141 mg/dL (ref <150)
VLDL: 28 mg/dL (ref 0–40)

## 2022-10-10 LAB — BASIC METABOLIC PANEL
Anion gap: 10 (ref 5–15)
Anion gap: 10 (ref 5–15)
BUN: 14 mg/dL (ref 8–23)
BUN: 14 mg/dL (ref 8–23)
CO2: 21 mmol/L — ABNORMAL LOW (ref 22–32)
CO2: 19 mmol/L — ABNORMAL LOW (ref 22–32)
Calcium: 8.7 mg/dL — ABNORMAL LOW (ref 8.9–10.3)
Calcium: 8.5 mg/dL — ABNORMAL LOW (ref 8.9–10.3)
Chloride: 105 mmol/L (ref 98–111)
Chloride: 104 mmol/L (ref 98–111)
Creatinine, Ser: 1.86 mg/dL — ABNORMAL HIGH (ref 0.61–1.24)
Creatinine, Ser: 1.74 mg/dL — ABNORMAL HIGH (ref 0.61–1.24)
GFR, Estimated: 38 mL/min — ABNORMAL LOW (ref >60)
GFR, Estimated: 42 mL/min — ABNORMAL LOW (ref >60)
Glucose, Bld: 116 mg/dL — ABNORMAL HIGH (ref 70–99)
Glucose, Bld: 113 mg/dL — ABNORMAL HIGH (ref 70–99)
Potassium: 4.2 mmol/L (ref 3.5–5.1)
Potassium: 4.3 mmol/L (ref 3.5–5.1)
Sodium: 136 mmol/L (ref 135–145)
Sodium: 133 mmol/L — ABNORMAL LOW (ref 135–145)

## 2022-10-10 LAB — CBC
HCT: 39.3 % (ref 39.0–52.0)
Hemoglobin: 13.4 g/dL (ref 13.0–17.0)
MCH: 30.4 pg (ref 26.0–34.0)
MCHC: 34.1 g/dL (ref 30.0–36.0)
MCV: 89.1 fL (ref 80.0–100.0)
Platelets: 254 10*3/uL (ref 150–400)
RBC: 4.41 MIL/uL (ref 4.22–5.81)
RDW: 12.4 % (ref 11.5–15.5)
WBC: 9.3 10*3/uL (ref 4.0–10.5)
nRBC: 0 % (ref 0.0–0.2)

## 2022-10-10 LAB — ECHOCARDIOGRAM COMPLETE
Area-P 1/2: 2.62 cm2
Height: 65 in
S' Lateral: 2.8 cm
Weight: 2734.4 oz

## 2022-10-10 LAB — CREATININE, SERUM
Creatinine, Ser: 1.56 mg/dL — ABNORMAL HIGH (ref 0.61–1.24)
GFR, Estimated: 47 mL/min — ABNORMAL LOW (ref >60)

## 2022-10-10 LAB — HEPARIN LEVEL (UNFRACTIONATED): Heparin Unfractionated: 0.19 IU/mL — ABNORMAL LOW (ref 0.30–0.70)

## 2022-10-10 SURGERY — LEFT HEART CATH AND CORS/GRAFTS ANGIOGRAPHY
Anesthesia: LOCAL

## 2022-10-10 MED ORDER — SODIUM CHLORIDE 0.9% FLUSH
3.0000 mL | Freq: Two times a day (BID) | INTRAVENOUS | Status: DC
  Administered 2022-10-11 (×2): 3 mL via INTRAVENOUS

## 2022-10-10 MED ORDER — SODIUM CHLORIDE 0.9% FLUSH: 3.0000 mL | INTRAVENOUS | Status: DC | PRN

## 2022-10-10 MED ORDER — PREDNISONE 20 MG PO TABS
40.0000 mg | ORAL_TABLET | Freq: Every day | ORAL | Status: DC
  Administered 2022-10-10 – 2022-10-11 (×2): 40 mg via ORAL
  Filled 2022-10-10 (×2): qty 2

## 2022-10-10 MED ORDER — FENTANYL CITRATE (PF) 100 MCG/2ML IJ SOLN
INTRAMUSCULAR | Status: AC
  Filled 2022-10-10: qty 2

## 2022-10-10 MED ORDER — HEPARIN (PORCINE) IN NACL 1000-0.9 UT/500ML-% IV SOLN
INTRAVENOUS | Status: DC | PRN
  Administered 2022-10-10 (×2): 500 mL

## 2022-10-10 MED ORDER — MIDAZOLAM HCL 2 MG/2ML IJ SOLN
INTRAMUSCULAR | Status: DC | PRN
  Administered 2022-10-10 (×2): 1 mg via INTRAVENOUS

## 2022-10-10 MED ORDER — SODIUM CHLORIDE 0.9 % WEIGHT BASED INFUSION
1.0000 mL/kg/h | INTRAVENOUS | Status: AC
  Administered 2022-10-10: 1 mL/kg/h via INTRAVENOUS

## 2022-10-10 MED ORDER — SODIUM CHLORIDE 0.9 % IV SOLN: 250.0000 mL | INTRAVENOUS | Status: DC | PRN

## 2022-10-10 MED ORDER — FENTANYL CITRATE (PF) 100 MCG/2ML IJ SOLN
INTRAMUSCULAR | Status: DC | PRN
  Administered 2022-10-10 (×2): 25 ug via INTRAVENOUS

## 2022-10-10 MED ORDER — HEPARIN BOLUS VIA INFUSION
2000.0000 [IU] | Freq: Once | INTRAVENOUS | Status: AC
  Administered 2022-10-10: 2000 [IU] via INTRAVENOUS
  Filled 2022-10-10: qty 2000

## 2022-10-10 MED ORDER — HYDRALAZINE HCL 20 MG/ML IJ SOLN: 10.0000 mg | INTRAMUSCULAR | Status: AC | PRN

## 2022-10-10 MED ORDER — IOHEXOL 350 MG/ML SOLN
INTRAVENOUS | Status: DC | PRN
  Administered 2022-10-10: 50 mL

## 2022-10-10 MED ORDER — COLCHICINE 0.6 MG PO TABS
0.6000 mg | ORAL_TABLET | Freq: Every day | ORAL | Status: DC
  Administered 2022-10-10 – 2022-10-11 (×2): 0.6 mg via ORAL
  Filled 2022-10-10 (×2): qty 1

## 2022-10-10 MED ORDER — COLCHICINE 0.6 MG PO TABS: 0.6000 mg | ORAL_TABLET | Freq: Every day | ORAL | Status: DC

## 2022-10-10 MED ORDER — MIDAZOLAM HCL 2 MG/2ML IJ SOLN
INTRAMUSCULAR | Status: AC
  Filled 2022-10-10: qty 2

## 2022-10-10 MED ORDER — LIDOCAINE HCL (PF) 1 % IJ SOLN
INTRAMUSCULAR | Status: AC
  Filled 2022-10-10: qty 30

## 2022-10-10 MED ORDER — COLCHICINE 0.6 MG PO TABS
0.6000 mg | ORAL_TABLET | Freq: Every day | ORAL | Status: DC | PRN
  Administered 2022-10-10: 0.6 mg via ORAL
  Filled 2022-10-10: qty 1

## 2022-10-10 MED ORDER — LIDOCAINE HCL (PF) 1 % IJ SOLN
INTRAMUSCULAR | Status: DC | PRN
  Administered 2022-10-10: 10 mL via INTRADERMAL

## 2022-10-10 MED ORDER — HEPARIN SODIUM (PORCINE) 5000 UNIT/ML IJ SOLN
5000.0000 [IU] | Freq: Three times a day (TID) | INTRAMUSCULAR | Status: DC
  Administered 2022-10-10 – 2022-10-11 (×2): 5000 [IU] via SUBCUTANEOUS
  Filled 2022-10-10: qty 1

## 2022-10-10 SURGICAL SUPPLY — 12 items
CATH INFINITI 5 FR IM (CATHETERS) IMPLANT
CATH INFINITI 5FR MULTPACK ANG (CATHETERS) IMPLANT
CLOSURE MYNX CONTROL 5F (Vascular Products) IMPLANT
KIT HEART LEFT (KITS) ×1 IMPLANT
KIT MICROPUNCTURE NIT STIFF (SHEATH) IMPLANT
PACK CARDIAC CATHETERIZATION (CUSTOM PROCEDURE TRAY) ×1 IMPLANT
SHEATH PINNACLE 5F 10CM (SHEATH) IMPLANT
SHEATH PROBE COVER 6X72 (BAG) IMPLANT
TRANSDUCER W/STOPCOCK (MISCELLANEOUS) ×1 IMPLANT
TUBING CIL FLEX 10 FLL-RA (TUBING) ×1 IMPLANT
WIRE EMERALD 3MM-J .035X150CM (WIRE) IMPLANT
WIRE HI TORQ VERSACORE-J 145CM (WIRE) IMPLANT

## 2022-10-10 NOTE — TOC Benefit Eligibility Note (Signed)
Patient Product/process development scientist completed.    The patient is currently admitted and upon discharge could be taking colchicine 0.6 mg tablets.  The current 30 day co-pay is $31.38.   The patient is insured through [employer]   This test claim was processed through [facility]- copay amounts may vary at other pharmacies due to pharmacy/plan contracts, or as the patient moves through the different stages of their insurance plan.  Roland Earl, CPHT Pharmacy Patient Advocate Specialist [facility] Patient Advocate Team Direct Number: [phone]  Fax: [phone]

## 2022-10-10 NOTE — Progress Notes (Signed)
Rounding Note    Patient Name: Billy Cisneros. Date of Encounter: 10/10/2022  [facility] Cardiologist: Chrystie Nose, MD   Subjective   No chest pain, but right hand gout pain  Inpatient Medications    Scheduled Meds:  aspirin EC  81 mg Oral Daily   buPROPion  300 mg Oral Daily   isosorbide mononitrate  60 mg Oral Daily   lisinopril  10 mg Oral Daily   metoprolol succinate  25 mg Oral Daily   omega-3 acid ethyl esters  1 g Oral BID   rosuvastatin  20 mg Oral QHS   sodium chloride flush  3 mL Intravenous Q12H   zolpidem  10 mg Oral QHS   Continuous Infusions:  sodium chloride     sodium chloride 1 mL/kg/hr (10/09/22 2128)   heparin 1,100 Units/hr (10/10/22 0409)   PRN Meds: sodium chloride, acetaminophen, ALPRAZolam, colchicine, HYDROcodone-acetaminophen, methocarbamol, nitroGLYCERIN, ondansetron (ZOFRAN) IV, sodium chloride flush   Vital Signs    Vitals:   10/09/22 1931 10/09/22 2327 10/10/22 0436 10/10/22 0721  BP: (!) 148/88 132/78 108/86   Pulse: 71 76 83 91  Resp: 13 16 17 18   Temp: (!) 97.5 F (36.4 C) 97.7 F (36.5 C) 98.6 F (37 C)   TempSrc: Oral Oral Oral   SpO2: 97% 96% 94% 97%  Weight: 77.5 kg     Height: 5\' 5"  (1.651 m)       Intake/Output Summary (Last 24 hours) at 10/10/2022 0811 Last data filed at 10/10/2022 0700 Gross per 24 hour  Intake 749.25 ml  Output 400 ml  Net 349.25 ml      10/09/2022    7:31 PM 10/09/2022    6:27 PM 10/03/2022    1:45 PM  Last 3 Weights  Weight (lbs) 170 lb 14.4 oz 173 lb 8 oz 173 lb 9.6 oz  Weight (kg) 77.52 kg 78.7 kg 78.744 kg      Telemetry    Sinus rhythm in the 90s - Personally Reviewed  ECG    No new tracings - Personally Reviewed  Physical Exam   GEN: No acute distress.   Neck: No JVD Cardiac: RRR, no murmurs, rubs, or gallops.  Respiratory: Clear to auscultation bilaterally. GI: Soft, nontender, non-distended  MS: No edema; No deformity. Neuro:  Nonfocal  Psych:  Normal affect  Right hand red and warm  Labs    High Sensitivity Troponin:   Recent Labs  Lab 10/09/22 1326 10/09/22 1458 10/09/22 2021 10/09/22 2142  TROPONINIHS 6 153* 441* 461*     Chemistry Recent Labs  Lab 10/09/22 1326 10/10/22 0243  NA 136 136  K 3.4* 4.2  CL 102 105  CO2 21* 21*  GLUCOSE 124* 116*  BUN 11 14  CREATININE 1.60* 1.86*  CALCIUM 8.9 8.7*  GFRNONAA 46* 38*  ANIONGAP 13 10    Lipids No results for input(s): "CHOL", "TRIG", "HDL", "LABVLDL", "LDLCALC", "CHOLHDL" in the last 168 hours.  Hematology Recent Labs  Lab 10/09/22 1326  WBC 9.7  RBC 4.56  HGB 14.0  HCT 41.2  MCV 90.4  MCH 30.7  MCHC 34.0  RDW 12.7  PLT 249   Thyroid No results for input(s): "TSH", "FREET4" in the last 168 hours.  BNPNo results for input(s): "BNP", "PROBNP" in the last 168 hours.  DDimer No results for input(s): "DDIMER" in the last 168 hours.   Radiology    DG Chest 1 View  Result Date: 10/09/2022 CLINICAL DATA:  Chest pain EXAM: CHEST  1 VIEW COMPARISON:  X-ray 11/11/2021 FINDINGS: Status post median sternotomy. Normal cardiopericardial silhouette without edema. No consolidation, pneumothorax or effusion. Tenting of the right hemidiaphragm is stable. Overlapping cardiac leads. IMPRESSION: Sternal wires.  No acute cardiopulmonary disease. Electronically Signed   By: Karen Kays M.D.   On: 10/09/2022 14:04   CT Cervical Spine Wo Contrast  Result Date: 10/09/2022 CLINICAL DATA:  Cervical radiculopathy, no red flags EXAM: CT CERVICAL SPINE WITHOUT CONTRAST TECHNIQUE: Multidetector CT imaging of the cervical spine was performed without intravenous contrast. Multiplanar CT image reconstructions were also generated. RADIATION DOSE REDUCTION: This exam was performed according to the departmental dose-optimization program which includes automated exposure control, adjustment of the mA and/or kV according to patient size and/or use of iterative reconstruction technique.  COMPARISON:  None Available. FINDINGS: Alignment: Normal. Skull base and vertebrae: Trace anterolisthesis of C4 on C5. Soft tissues and spinal canal: No prevertebral fluid or swelling. No visible canal hematoma. Disc levels: There is a central disc extrusion at C4-C5. There is likely moderate spinal canal narrowing at C4-C5. Severe bilateral neural foraminal stenosis at C3-C4. Upper chest: Negative. Other: None IMPRESSION: 1. No acute fracture or traumatic listhesis. 2. Central disc extrusion at C4-C5 with likely moderate spinal canal narrowing. Consider further evaluation with MRI. 3. Severe bilateral neural foraminal stenosis at C3-C4. Electronically Signed   By: Lorenza Cambridge M.D.   On: 10/09/2022 12:15    Cardiac Studies   Echo today  Left heart cath today  Patient Profile     72 y.o. male with CAD s/p 5v CABG in 2000 (LIMA-LAD, RIMA-RPDA, SVG-OM1, diagonal and ramus), hyperlipidemia, cervical spinal stenosis with neck pain and CKD stage III who was admitted 10/09/22 for chest pain.  Assessment & Plan    Chest pain - symptoms concerning for angina - HST 6 --> 153 --> 441 --> 461 - EKG   CAD s/p CABG x 5 2000 LIMA-LAD, RIMA-RPDA, SVG-OM1, SVG-diagonal, SVG-ramus - last heart cath 11/2021 with stable disease and occluded SVG jump segment from ramus to OM, occluded SVG-diagonal - maintained on ASA, imdur, BB, crestor 20 mg, lisinopril   Hypertension  - managed in the context of CAD   Acute on chronic kidney disease III - baseline sCr 1.6 - sCr today 1.86 - getting gentle fluids - recheck BMP at 10AM   Hyperlipidemia with LDL goal < 70 Will add on lipid panel this mornig On 20 mg crestor    Acute gout flare - right hand - continue colchicine - he routinely needs steroids for gout flare - this one is the "worst one in a while" - will order 40 mg prednisone daily x 3 days    For questions or updates, please contact [facility] Please consult [email or web address] for  contact info under        Signed, Marcelino Duster, PA  10/10/2022, 8:11 AM

## 2022-10-10 NOTE — Care Management (Signed)
10-10-22 10-10-22 Patient was discussed in progression rounds this morning. Patient is currently on colchicine; benefits check submitted for cost and co pay is $31.38. No further home needs identified at this time.

## 2022-10-10 NOTE — Progress Notes (Signed)
Echocardiogram 2D Echocardiogram has been performed.  Warren Lacy Aleah Ahlgrim RDCS 10/10/2022, 8:41 AM

## 2022-10-10 NOTE — Progress Notes (Signed)
ANTICOAGULATION CONSULT NOTE  Pharmacy Consult for heparin  Indication: chest pain/ACS Brief A/P: Heparin level subtherapeutic Increase Heparin rate  No Known Allergies  Patient Measurements: Height: 5\' 5"  (165.1 cm) Weight: 77.5 kg (170 lb 14.4 oz) IBW/kg (Calculated) : 61.5 Heparin Dosing Weight: 77.4 kg   Vital Signs: Temp: 97.7 F (36.5 C) (05/30 2327) Temp Source: Oral (05/30 2327) BP: 132/78 (05/30 2327) Pulse Rate: 76 (05/30 2327)  Labs: Recent Labs    10/09/22 1326 10/09/22 1458 10/09/22 2021 10/09/22 2142 10/10/22 0243  HGB 14.0  --   --   --   --   HCT 41.2  --   --   --   --   PLT 249  --   --   --   --   HEPARINUNFRC  --   --   --   --  0.19*  CREATININE 1.60*  --   --   --  1.86*  TROPONINIHS 6 153* 441* 461*  --      Estimated Creatinine Clearance: 35.5 mL/min (A) (by C-G formula based on SCr of 1.86 mg/dL (H)).   Assessment: 72 y.o. male with chest pain for heparin   Goal of Therapy:  Heparin level 0.3-0.7 units/ml Monitor platelets by anticoagulation protocol: Yes   Plan:  Heparin 2000 units IV bolus, then increase heparin 1100 units/hr Follow up after cath today    Geannie Risen, PharmD, BCPS

## 2022-10-11 DIAGNOSIS — M542 Cervicalgia: Secondary | ICD-10-CM | POA: Diagnosis not present

## 2022-10-11 DIAGNOSIS — I214 Non-ST elevation (NSTEMI) myocardial infarction: Secondary | ICD-10-CM | POA: Diagnosis not present

## 2022-10-11 DIAGNOSIS — N1831 Chronic kidney disease, stage 3a: Secondary | ICD-10-CM

## 2022-10-11 LAB — BASIC METABOLIC PANEL
Anion gap: 8 (ref 5–15)
BUN: 16 mg/dL (ref 8–23)
CO2: 21 mmol/L — ABNORMAL LOW (ref 22–32)
Calcium: 8.8 mg/dL — ABNORMAL LOW (ref 8.9–10.3)
Chloride: 106 mmol/L (ref 98–111)
Creatinine, Ser: 1.43 mg/dL — ABNORMAL HIGH (ref 0.61–1.24)
GFR, Estimated: 53 mL/min — ABNORMAL LOW (ref >60)
Glucose, Bld: 144 mg/dL — ABNORMAL HIGH (ref 70–99)
Potassium: 4.4 mmol/L (ref 3.5–5.1)
Sodium: 135 mmol/L (ref 135–145)

## 2022-10-11 MED ORDER — METOPROLOL SUCCINATE ER 50 MG PO TB24
50.0000 mg | ORAL_TABLET | Freq: Every day | ORAL | 1 refills | Status: DC
Start: 2022-10-11 — End: 2023-06-30

## 2022-10-11 MED ORDER — PREDNISONE 20 MG PO TABS: 40.0000 mg | ORAL_TABLET | Freq: Every day | ORAL | 0 refills | Status: AC

## 2022-10-11 MED ORDER — PANTOPRAZOLE SODIUM 40 MG PO TBEC: 40.0000 mg | DELAYED_RELEASE_TABLET | Freq: Every day | ORAL | Status: DC

## 2022-10-11 MED ORDER — METOPROLOL SUCCINATE ER 50 MG PO TB24: 50.0000 mg | ORAL_TABLET | Freq: Every day | ORAL | Status: DC

## 2022-10-11 MED ORDER — PANTOPRAZOLE SODIUM 40 MG PO TBEC
80.0000 mg | DELAYED_RELEASE_TABLET | Freq: Every day | ORAL | Status: DC
  Administered 2022-10-11: 80 mg via ORAL
  Filled 2022-10-11: qty 2

## 2022-10-11 NOTE — Discharge Summary (Signed)
Discharge Summary    Patient ID: Billy Cisneros. MRN: [id number]; DOB: Jul 09, 1950  Admit date: 10/09/2022 Discharge date: 10/11/2022  PCP:  Lynnea Ferrier, MD   [facility] Providers Cardiologist:  Chrystie Nose, MD   {   Discharge Diagnoses    Principal Problem:   NSTEMI (non-ST elevated myocardial infarction) [facility]) Active Problems:   Essential hypertension   Neck pain on left side   Hyperlipidemia LDL goal <70   Diagnostic Studies/Procedures    Echocardiogram: 10/10/2022 IMPRESSIONS     1. Left ventricular ejection fraction, by estimation, is 55 to 60%. The  left ventricle has normal function. The left ventricle has no regional  wall motion abnormalities. Left ventricular diastolic parameters are  consistent with Grade I diastolic  dysfunction (impaired relaxation).   2. Right ventricular systolic function is normal. The right ventricular  size is normal. Tricuspid regurgitation signal is inadequate for assessing  PA pressure.   3. The mitral valve is normal in structure. Trivial mitral valve  regurgitation. No evidence of mitral stenosis.   4. The aortic valve is tricuspid. Aortic valve regurgitation is not  visualized. No aortic stenosis is present.   5. The inferior vena cava is normal in size with greater than 50%  respiratory variability, suggesting right atrial pressure of 3 mmHg.   Cardiac Catheterization: 10/10/2022    Ost LM to Dist LM lesion is 50% stenosed.   Mid LAD lesion is 100% stenosed.   Ost LAD to Mid LAD lesion is 99% stenosed.   Ost Cx to Prox Cx lesion is 99% stenosed.   Origin lesion is 30% stenosed.   Origin to Insertion lesion between Ramus and 3rd Mrg  is 100% stenosed.   Origin to Insertion lesion is 100% stenosed.   3rd Mrg lesion is 100% stenosed.   LIMA and is small.   SVG and is normal in caliber.   SVG.   LV end diastolic pressure is normal.   Severe 3 vessel obstructive CAD Patent LIMA to the  LAD Patent RIMA to the PDA Patent SVG to the ramus intermedius Normal LVEDP   Plan: no change compared to prior cardiac cath in July 2023. Continue medical management.  History of Present Illness     Billy Cisneros. is a 72 y.o. male with past medical history of CAD (s/p CABG in 2000 with LIMA-LAD, RIMA-rPDA and SVG-OM1-D1-RI, cath in 10/2019 showing patent LIMA-LAD and RIMA-PDA with patent DVG-RI but occlusion of the limb to the OM, similar results by repeat cath in 11/2021), HTN, HLD, anxiety, GERD, spinal stenosis, Stage 3 CKD and gout who presented to [facility] ED on 10/09/2022 for evaluation of neck pain.   Reported having worsening neck pain for the past few months and CT Spine showed no acute fracture but was noted to have moderate spinal stenosis along C4-C5 and severe bilateral neuroforaminal stenosis at C3-C4 (outpatient follow-up with Neurosurgery recommended and included on AVS). He did report some intermittent chest pain as well. He was found to have an NSTEMI with Hs Troponin trending up from 6 to 153, 441 and 461. EKG showed no acute ST changes. He was admitted for further management with plans for a cardiac catheterization for definitive evaluation.   Hospital Course     Consultants: None   Catheterization on 10/10/2022 showed severe 3-vessel CAD with patent LIMA-LAD, patent RIMA-PDA and patent SVG-RI with normal LVEDP. No changes were noted when compared to his prior cath  in 11/2021 and continued medical management was recommended. Echocardiogram showed a preserved EF of 55-60% with no regional WMA. Noted to have Grade 1 DD, normal RV function and trivial MR.   The following morning, he reported feeling well and denied any recurrent pain. Toprol-XL was titrated to 50mg  daily for additional anti-anginal benefit. Renal function did improve as creatinine peaked at 1.86 and had improved to 1.43 at the time of discharge. FLP this admission showed his total cholesterol was at 130  with LDL at 59. LPa pending. He was treated for an acute gout flare along his right hand with Colchicine and a short course of Prednisone 40mg  for 3 days. He was examined by Dr. Bjorn Pippin and deemed stable for discharge. A follow-up appointment has been arranged.      Did the patient have an acute coronary syndrome (MI, NSTEMI, STEMI, etc) this admission?:  Yes                               AHA/ACC Clinical Performance & Quality Measures: Aspirin prescribed? - Yes ADP Receptor Inhibitor (Plavix/Clopidogrel, Brilinta/Ticagrelor or Effient/Prasugrel) prescribed (includes medically managed patients)? - No - No change from prior cath Beta Blocker prescribed? - Yes High Intensity Statin (Lipitor 40-80mg  or Crestor 20-40mg ) prescribed? - Yes EF assessed during THIS hospitalization? - Yes For EF <40%, was ACEI/ARB prescribed? - Not Applicable (EF >/= 40%) For EF <40%, Aldosterone Antagonist (Spironolactone or Eplerenone) prescribed? - Not Applicable (EF >/= 40%) Cardiac Rehab Phase II ordered (including medically managed patients)? - Yes   _____________  Discharge Physical Exam and Vitals Blood pressure 108/68, pulse 78, temperature 98.6 F (37 C), temperature source Oral, resp. rate 20, height 5\' 5"  (1.651 m), weight 77.5 kg, SpO2 95 %.  Filed Weights   10/09/22 1827 10/09/22 1931  Weight: 78.7 kg 77.5 kg   General: Pleasant, NAD Psych: Normal affect. Neuro: Alert and oriented X 3. Moves all extremities spontaneously. HEENT: Normal  Neck: Supple without bruits or JVD. Lungs:  Resp regular and unlabored, CTA. Heart: RRR no s3, s4, or murmurs. Abdomen: Soft, non-tender, non-distended, BS + x 4.  Extremities: No clubbing, cyanosis or edema. DP/PT/Radials 2+ and equal bilaterally.   Labs & Radiologic Studies    CBC Recent Labs    10/09/22 1326 10/10/22 1607  WBC 9.7 9.3  HGB 14.0 13.4  HCT 41.2 39.3  MCV 90.4 89.1  PLT 249 254   Basic Metabolic Panel Recent Labs     10/10/22 0941 10/10/22 1607 10/11/22 0150  NA 133*  --  135  K 4.3  --  4.4  CL 104  --  106  CO2 19*  --  21*  GLUCOSE 113*  --  144*  BUN 14  --  16  CREATININE 1.74* 1.56* 1.43*  CALCIUM 8.5*  --  8.8*   Liver Function Tests No results for input(s): "AST", "ALT", "ALKPHOS", "BILITOT", "PROT", "ALBUMIN" in the last 72 hours. No results for input(s): "LIPASE", "AMYLASE" in the last 72 hours. High Sensitivity Troponin:   Recent Labs  Lab 10/09/22 1326 10/09/22 1458 10/09/22 2021 10/09/22 2142  TROPONINIHS 6 153* 441* 461*    BNP Invalid input(s): "POCBNP" D-Dimer No results for input(s): "DDIMER" in the last 72 hours. Hemoglobin A1C No results for input(s): "HGBA1C" in the last 72 hours. Fasting Lipid Panel Recent Labs    10/10/22 0941  CHOL 130  HDL 43  LDLCALC 59  TRIG 141  CHOLHDL 3.0   Thyroid Function Tests No results for input(s): "TSH", "T4TOTAL", "T3FREE", "THYROIDAB" in the last 72 hours.  Invalid input(s): "FREET3" _____________   DG Chest 1 View  Result Date: 10/09/2022 CLINICAL DATA:  Chest pain EXAM: CHEST  1 VIEW COMPARISON:  X-ray 11/11/2021 FINDINGS: Status post median sternotomy. Normal cardiopericardial silhouette without edema. No consolidation, pneumothorax or effusion. Tenting of the right hemidiaphragm is stable. Overlapping cardiac leads. IMPRESSION: Sternal wires.  No acute cardiopulmonary disease. Electronically Signed   By: Karen Kays M.D.   On: 10/09/2022 14:04   CT Cervical Spine Wo Contrast  Result Date: 10/09/2022 CLINICAL DATA:  Cervical radiculopathy, no red flags EXAM: CT CERVICAL SPINE WITHOUT CONTRAST TECHNIQUE: Multidetector CT imaging of the cervical spine was performed without intravenous contrast. Multiplanar CT image reconstructions were also generated. RADIATION DOSE REDUCTION: This exam was performed according to the departmental dose-optimization program which includes automated exposure control, adjustment of the mA  and/or kV according to patient size and/or use of iterative reconstruction technique. COMPARISON:  None Available. FINDINGS: Alignment: Normal. Skull base and vertebrae: Trace anterolisthesis of C4 on C5. Soft tissues and spinal canal: No prevertebral fluid or swelling. No visible canal hematoma. Disc levels: There is a central disc extrusion at C4-C5. There is likely moderate spinal canal narrowing at C4-C5. Severe bilateral neural foraminal stenosis at C3-C4. Upper chest: Negative. Other: None IMPRESSION: 1. No acute fracture or traumatic listhesis. 2. Central disc extrusion at C4-C5 with likely moderate spinal canal narrowing. Consider further evaluation with MRI. 3. Severe bilateral neural foraminal stenosis at C3-C4. Electronically Signed   By: Lorenza Cambridge M.D.   On: 10/09/2022 12:15   Disposition   Pt is being discharged home today in good condition.  Follow-up Plans & Appointments     Follow-up Information     [facility] In 1 week.   Specialty: Neurosurgery Contact information: [address] [place] [address] [phone]         Ronney Asters, NP Follow up on 10/27/2022.   Specialty: Cardiology Why: Cardiology Hospital Follow-up on 10/27/2022 at 1:55 PM. Contact information: [address] [place] [address] [phone]                Discharge Instructions     AMB referral to Phase II Cardiac Rehabilitation   Complete by: As directed    Diagnosis: NSTEMI   After initial evaluation and assessments completed: Virtual Based Care may be provided alone or in conjunction with Phase 2 Cardiac Rehab based on patient barriers.: Yes   Intensive Cardiac Rehabilitation (ICR) [facility] location only OR Traditional Cardiac Rehabilitation (TCR) *If criteria for ICR are not met will enroll in TCR [facility] only): Yes   Ambulatory referral to Cardiology   Complete by: As directed    If you have not heard from the  Cardiology office within the next 72 hours please call [phone].   Diet - low sodium heart healthy   Complete by: As directed    Discharge instructions   Complete by: As directed    PLEASE REMEMBER TO BRING ALL OF YOUR MEDICATIONS TO EACH OF YOUR FOLLOW-UP OFFICE VISITS.  PLEASE ATTEND ALL SCHEDULED FOLLOW-UP APPOINTMENTS.   Activity: Increase activity slowly as tolerated. You may shower, but no soaking baths (or swimming) for 1 week. No driving for 24 hours. No lifting over 5 lbs for 1 week. No sexual activity for 1 week.  You May Return to Work: in 1 week (if applicable)  Wound Care: You may wash cath site gently with soap and water. Keep cath site clean and dry. If you notice pain, swelling, bleeding or pus at your cath site, please call [phone].   Increase activity slowly   Complete by: As directed         Discharge Medications   Allergies as of 10/11/2022   No Known Allergies      Medication List     TAKE these medications    allopurinol 100 MG tablet Commonly known as: ZYLOPRIM Take 100 mg by mouth as needed (gout flare up).   ALPRAZolam 0.5 MG tablet Commonly known as: XANAX TAKE 1 TABLET BY MOUTH 3 TIMES A DAY AS NEEDED FOR ANXIETY.   aspirin EC 81 MG tablet Take 1 tablet (81 mg total) by mouth daily. For heart attack prevention.   buPROPion 300 MG 24 hr tablet Commonly known as: WELLBUTRIN XL Take 1 tablet (300 mg total) by mouth daily.   colchicine 0.6 MG tablet Take 0.6 mg by mouth daily as needed (gout).   HYDROcodone-acetaminophen 5-325 MG tablet Commonly known as: NORCO/VICODIN Take 1 tablet by mouth every 6 (six) hours as needed for moderate pain.   isosorbide mononitrate 60 MG 24 hr tablet Commonly known as: IMDUR Take 1 tablet (60 mg total) by mouth daily.   lisinopril 10 MG tablet Commonly known as: ZESTRIL Take 1 tablet (10 mg total) by mouth daily.   metoprolol succinate 50 MG 24 hr tablet Commonly known as: Toprol XL Take 1  tablet (50 mg total) by mouth daily for 180 doses. What changed:  medication strength how much to take   multivitamin with minerals tablet Take 1 tablet by mouth at bedtime.   nitroGLYCERIN 0.4 MG SL tablet Commonly known as: NITROSTAT Place 1 tablet (0.4 mg total) under the tongue every 5 (five) minutes as needed for chest pain.   omega-3 acid ethyl esters 1 g capsule Commonly known as: LOVAZA Take 1 capsule (1 g total) by mouth 2 (two) times daily.   omeprazole 20 MG capsule Commonly known as: PRILOSEC Take 1 capsule (20 mg total) by mouth 2 (two) times daily before a meal. What changed:  how much to take when to take this   predniSONE 10 MG tablet Commonly known as: DELTASONE Take by mouth as needed. What changed: Another medication with the same name was added. Make sure you understand how and when to take each.   predniSONE 20 MG tablet Commonly known as: DELTASONE Take 2 tablets (40 mg total) by mouth daily with breakfast for 1 day. Start taking on: October 12, 2022 What changed: You were already taking a medication with the same name, and this prescription was added. Make sure you understand how and when to take each.   rosuvastatin 20 MG tablet Commonly known as: CRESTOR Take 1 tablet (20 mg total) by mouth at bedtime.   valACYclovir 500 MG tablet Commonly known as: VALTREX Take 500 mg by mouth 2 (two) times daily as needed (outbreak).   zolpidem 10 MG tablet Commonly known as: AMBIEN Take 1 tablet (10 mg total) by mouth at bedtime.           Outstanding Labs/Studies   None  Duration of Discharge Encounter   Greater than 30 minutes including physician time.  Signed, Ellsworth Lennox, PA-C 10/11/2022, 11:47 AM

## 2022-10-11 NOTE — Progress Notes (Signed)
CARDIAC REHAB PHASE I   PRE:  Rate/Rhythm: 66 SR   BP:  Sitting: 128/68      SaO2: 96 RA   MODE:  Ambulation: 150 ft   POST:  Rate/Rhythm: 75 SR  BP:  Sitting: 149/86      SaO2: 99 RA  Pt ambulated in hall tolerating well with no CP, SOB or dizziness. Returned to bed with call bell and bedside table in reach. Post MI education including site care, restrictions, risk factors, heart healthy diet, exercise guidelines, NTG use and CRP2 reviewed. All questions and concerns addressed. Will refer to [facility] for CRP2. Plan for home today.   [id number]    Woodroe Chen, RN BSN 10/11/2022 11:59 AM

## 2022-10-11 NOTE — Progress Notes (Addendum)
Rounding Note    Patient Name: Billy Cisneros. Date of Encounter: 10/11/2022  [facility] Cardiologist: Chrystie Nose, MD   Subjective   Cr 1.43 today.  BP 108/68.  Denies chest pain  Inpatient Medications    Scheduled Meds:  aspirin EC  81 mg Oral Daily   buPROPion  300 mg Oral Daily   colchicine  0.6 mg Oral Daily   heparin  5,000 Units Subcutaneous Q8H   isosorbide mononitrate  60 mg Oral Daily   lisinopril  10 mg Oral Daily   metoprolol succinate  25 mg Oral Daily   omega-3 acid ethyl esters  1 g Oral BID   predniSONE  40 mg Oral Q breakfast   rosuvastatin  20 mg Oral QHS   sodium chloride flush  3 mL Intravenous Q12H   sodium chloride flush  3 mL Intravenous Q12H   zolpidem  10 mg Oral QHS   Continuous Infusions:  sodium chloride     PRN Meds: sodium chloride, acetaminophen, ALPRAZolam, HYDROcodone-acetaminophen, methocarbamol, nitroGLYCERIN, ondansetron (ZOFRAN) IV, sodium chloride flush   Vital Signs    Vitals:   10/10/22 1715 10/10/22 1955 10/11/22 0422 10/11/22 0811  BP: 107/86 109/70 112/61 108/68  Pulse: 75 74 80 78  Resp: 20 15 18 20   Temp: 98 F (36.7 C) 97.9 F (36.6 C) 98.4 F (36.9 C) 98.6 F (37 C)  TempSrc: Oral Oral Oral Oral  SpO2: 93% 95% 93% 95%  Weight:      Height:        Intake/Output Summary (Last 24 hours) at 10/11/2022 1102 Last data filed at 10/10/2022 2213 Gross per 24 hour  Intake 970.92 ml  Output 1400 ml  Net -429.08 ml      10/09/2022    7:31 PM 10/09/2022    6:27 PM 10/03/2022    1:45 PM  Last 3 Weights  Weight (lbs) 170 lb 14.4 oz 173 lb 8 oz 173 lb 9.6 oz  Weight (kg) 77.52 kg 78.7 kg 78.744 kg      Telemetry    NSR - Personally Reviewed  ECG    No new ECG - Personally Reviewed  Physical Exam   GEN: No acute distress.   Neck: No JVD Cardiac: RRR, no murmurs, rubs, or gallops.  Respiratory: Clear to auscultation bilaterally. GI: Soft, nontender, non-distended  MS: No edema; No  deformity. Neuro:  Nonfocal  Psych: Normal affect   Labs    High Sensitivity Troponin:   Recent Labs  Lab 10/09/22 1326 10/09/22 1458 10/09/22 2021 10/09/22 2142  TROPONINIHS 6 153* 441* 461*     Chemistry Recent Labs  Lab 10/10/22 0243 10/10/22 0941 10/10/22 1607 10/11/22 0150  NA 136 133*  --  135  K 4.2 4.3  --  4.4  CL 105 104  --  106  CO2 21* 19*  --  21*  GLUCOSE 116* 113*  --  144*  BUN 14 14  --  16  CREATININE 1.86* 1.74* 1.56* 1.43*  CALCIUM 8.7* 8.5*  --  8.8*  GFRNONAA 38* 42* 47* 53*  ANIONGAP 10 10  --  8    Lipids  Recent Labs  Lab 10/10/22 0941  CHOL 130  TRIG 141  HDL 43  LDLCALC 59  CHOLHDL 3.0    Hematology Recent Labs  Lab 10/09/22 1326 10/10/22 1607  WBC 9.7 9.3  RBC 4.56 4.41  HGB 14.0 13.4  HCT 41.2 39.3  MCV 90.4 89.1  MCH 30.7 30.4  MCHC 34.0 34.1  RDW 12.7 12.4  PLT 249 254   Thyroid No results for input(s): "TSH", "FREET4" in the last 168 hours.  BNPNo results for input(s): "BNP", "PROBNP" in the last 168 hours.  DDimer No results for input(s): "DDIMER" in the last 168 hours.   Radiology    CARDIAC CATHETERIZATION  Result Date: 10/10/2022   Ost LM to Dist LM lesion is 50% stenosed.   Mid LAD lesion is 100% stenosed.   Ost LAD to Mid LAD lesion is 99% stenosed.   Ost Cx to Prox Cx lesion is 99% stenosed.   Origin lesion is 30% stenosed.   Origin to Insertion lesion between Ramus and 3rd Mrg  is 100% stenosed.   Origin to Insertion lesion is 100% stenosed.   3rd Mrg lesion is 100% stenosed.   LIMA and is small.   SVG and is normal in caliber.   SVG.   LV end diastolic pressure is normal. Severe 3 vessel obstructive CAD Patent LIMA to the LAD Patent RIMA to the PDA Patent SVG to the ramus intermedius Normal LVEDP Plan: no change compared to prior cardiac cath in July 2023. Continue medical management.   ECHOCARDIOGRAM COMPLETE  Result Date: 10/10/2022    ECHOCARDIOGRAM REPORT   Patient Name:   Billy Cisneros. Date of  Exam: 10/10/2022 Medical Rec #:  [id number]             Height:       65.0 in Accession #:    [id number]            Weight:       170.9 lb Date of Birth:  August 11, 1950            BSA:          1.850 m Patient Age:    71 years              BP:           119/78 mmHg Patient Gender: M                     HR:           94 bpm. Exam Location:  Inpatient Procedure: 2D Echo, Color Doppler and Cardiac Doppler Indications:    R07.9* Chest pain, unspecified  History:        Patient has prior history of Echocardiogram examinations, most                 recent 12/21/2014. Prior CABG; Risk Factors:Hypertension and                 Dyslipidemia.  Sonographer:    Irving Burton Senior RDCS Referring Phys: [phone] HAO MENG IMPRESSIONS  1. Left ventricular ejection fraction, by estimation, is 55 to 60%. The left ventricle has normal function. The left ventricle has no regional wall motion abnormalities. Left ventricular diastolic parameters are consistent with Grade I diastolic dysfunction (impaired relaxation).  2. Right ventricular systolic function is normal. The right ventricular size is normal. Tricuspid regurgitation signal is inadequate for assessing PA pressure.  3. The mitral valve is normal in structure. Trivial mitral valve regurgitation. No evidence of mitral stenosis.  4. The aortic valve is tricuspid. Aortic valve regurgitation is not visualized. No aortic stenosis is present.  5. The inferior vena cava is normal in size with greater than 50% respiratory variability, suggesting right atrial pressure of 3 mmHg. FINDINGS  Left  Ventricle: Left ventricular ejection fraction, by estimation, is 55 to 60%. The left ventricle has normal function. The left ventricle has no regional wall motion abnormalities. The left ventricular internal cavity size was normal in size. There is  no left ventricular hypertrophy. Left ventricular diastolic parameters are consistent with Grade I diastolic dysfunction (impaired relaxation). Indeterminate filling  pressures. Right Ventricle: The right ventricular size is normal. No increase in right ventricular wall thickness. Right ventricular systolic function is normal. Tricuspid regurgitation signal is inadequate for assessing PA pressure. Left Atrium: Left atrial size was normal in size. Right Atrium: Right atrial size was normal in size. Pericardium: Trivial pericardial effusion is present. Presence of epicardial fat layer. Mitral Valve: The mitral valve is normal in structure. Trivial mitral valve regurgitation. No evidence of mitral valve stenosis. Tricuspid Valve: The tricuspid valve is normal in structure. Tricuspid valve regurgitation is trivial. No evidence of tricuspid stenosis. Aortic Valve: The aortic valve is tricuspid. Aortic valve regurgitation is not visualized. No aortic stenosis is present. Pulmonic Valve: The pulmonic valve was normal in structure. Pulmonic valve regurgitation is not visualized. No evidence of pulmonic stenosis. Aorta: The aortic root and ascending aorta are structurally normal, with no evidence of dilitation. Venous: The inferior vena cava is normal in size with greater than 50% respiratory variability, suggesting right atrial pressure of 3 mmHg. IAS/Shunts: The interatrial septum was not well visualized.  LEFT VENTRICLE PLAX 2D LVIDd:         3.90 cm   Diastology LVIDs:         2.80 cm   LV e' medial:    9.25 cm/s LV PW:         1.10 cm   LV E/e' medial:  10.0 LV IVS:        0.90 cm   LV e' lateral:   10.90 cm/s LVOT diam:     1.90 cm   LV E/e' lateral: 8.5 LV SV:         62 LV SV Index:   34 LVOT Area:     2.84 cm  RIGHT VENTRICLE RV S prime:     12.20 cm/s TAPSE (M-mode): 1.6 cm LEFT ATRIUM             Index        RIGHT ATRIUM           Index LA diam:        4.00 cm 2.16 cm/m   RA Area:     14.30 cm LA Vol (A2C):   20.0 ml 10.81 ml/m  RA Volume:   33.20 ml  17.94 ml/m LA Vol (A4C):   36.7 ml 19.84 ml/m LA Biplane Vol: 27.3 ml 14.75 ml/m  AORTIC VALVE LVOT Vmax:   123.00 cm/s  LVOT Vmean:  88.400 cm/s LVOT VTI:    0.219 m  AORTA Ao Root diam: 2.50 cm Ao Asc diam:  2.90 cm MITRAL VALVE MV Area (PHT): 2.62 cm     SHUNTS MV Decel Time: 290 msec     Systemic VTI:  0.22 m MV E velocity: 92.20 cm/s   Systemic Diam: 1.90 cm MV A velocity: 128.00 cm/s MV E/A ratio:  0.72 Chilton Si MD Electronically signed by Chilton Si MD Signature Date/Time: 10/10/2022/10:50:35 AM    Final    DG Chest 1 View  Result Date: 10/09/2022 CLINICAL DATA:  Chest pain EXAM: CHEST  1 VIEW COMPARISON:  X-ray 11/11/2021 FINDINGS: Status post median sternotomy. Normal cardiopericardial silhouette without  edema. No consolidation, pneumothorax or effusion. Tenting of the right hemidiaphragm is stable. Overlapping cardiac leads. IMPRESSION: Sternal wires.  No acute cardiopulmonary disease. Electronically Signed   By: Karen Kays M.D.   On: 10/09/2022 14:04   CT Cervical Spine Wo Contrast  Result Date: 10/09/2022 CLINICAL DATA:  Cervical radiculopathy, no red flags EXAM: CT CERVICAL SPINE WITHOUT CONTRAST TECHNIQUE: Multidetector CT imaging of the cervical spine was performed without intravenous contrast. Multiplanar CT image reconstructions were also generated. RADIATION DOSE REDUCTION: This exam was performed according to the departmental dose-optimization program which includes automated exposure control, adjustment of the mA and/or kV according to patient size and/or use of iterative reconstruction technique. COMPARISON:  None Available. FINDINGS: Alignment: Normal. Skull base and vertebrae: Trace anterolisthesis of C4 on C5. Soft tissues and spinal canal: No prevertebral fluid or swelling. No visible canal hematoma. Disc levels: There is a central disc extrusion at C4-C5. There is likely moderate spinal canal narrowing at C4-C5. Severe bilateral neural foraminal stenosis at C3-C4. Upper chest: Negative. Other: None IMPRESSION: 1. No acute fracture or traumatic listhesis. 2. Central disc extrusion at  C4-C5 with likely moderate spinal canal narrowing. Consider further evaluation with MRI. 3. Severe bilateral neural foraminal stenosis at C3-C4. Electronically Signed   By: Lorenza Cambridge M.D.   On: 10/09/2022 12:15    Cardiac Studies     Patient Profile     72 y.o. male with CAD s/p 5v CABG in 2000 (LIMA-LAD, RIMA-RPDA, SVG-OM1, diagonal and ramus), hyperlipidemia, cervical spinal stenosis with neck pain and CKD stage III who was admitted 10/09/22 for chest pain.   Assessment & Plan    Chest pain: h.o CAD s/p CABG x 5 2000 (LIMA-LAD, RIMA-RPDA, SVG-OM1, SVG-diagonal, SVG-ramus).  Last heart cath 11/2021 with stable disease and occluded SVG jump segment from ramus to OM, occluded SVG-diagonal.  Presented with symptoms concerning for angina, troponin up to 461.  Cath 5/31 showed severe three-vessel obstructive CAD, patent LIMA-LAD RIMA-PDA, SVG-ramus, occluded SVG-diagonal and occluded SVG jump segment from ramus to OM.  Medical management recommended.  Echocardiogram showed EF 55 to 60%, normal RV function, no significant valvular disease -Continue ASA 81 mg daily -Continue rosuvastatin 20 mg daily -Continue Imdur 60 mg daily.  Increase Toprol-XL to 50 mg daily  Hypertension: On lisinopril 10 mg daily, Imdur 60 mg daily, and Toprol-XL as above  Hyperlipidemia: On rosuvastatin 20 mg daily  Gout: Having acute gout flare.  On prednisone, colchicine  CKD 3a: Baseline creatinine around 1.6, was up to 1.86 yesterday, improved to 1.43 today  Cervical spinal stenosis: Outpatient referral to neurosurgery recommended  Disposition: planning discharge today  For questions or updates, please contact [facility] Please consult [email or web address] for contact info under        Signed, Little Ishikawa, MD  10/11/2022, 11:02 AM

## 2022-10-13 ENCOUNTER — Encounter (HOSPITAL_COMMUNITY): Payer: Self-pay | Admitting: Cardiology

## 2022-10-13 ENCOUNTER — Ambulatory Visit
Admission: EM | Admit: 2022-10-13 | Discharge: 2022-10-13 | Disposition: A | Discharge status: Home / Self Care | Payer: Medicare Other

## 2022-10-13 DIAGNOSIS — Z4802 Encounter for removal of sutures: Secondary | ICD-10-CM | POA: Diagnosis not present

## 2022-10-13 NOTE — ED Triage Notes (Signed)
Patient presents to UC for suture removal. Seen 05/24 for laceration. A total of 5 sutures placed at initial. States he believes 2 fell out. At this visit only 3 intact. No s/s of infection present.

## 2022-10-14 LAB — LIPOPROTEIN A (LPA): Lipoprotein (a): 97.3 nmol/L — ABNORMAL HIGH (ref <75.0)

## 2022-10-24 NOTE — Progress Notes (Deleted)
Cardiology Clinic Note   Patient Name: Billy Cisneros. Date of Encounter: 10/24/2022  Primary Care Provider:  Lynnea Ferrier, MD Primary Cardiologist:  Chrystie Nose, MD  Patient Profile    Billy Cisneros. 72 year old male presents the clinic today for follow-up evaluation of his coronary artery disease .  Past Medical History    Past Medical History:  Diagnosis Date   Anxiety    Coronary artery disease    Depression    GERD (gastroesophageal reflux disease)    Gout    Hypertension    NSTEMI (non-ST elevated myocardial infarction) (HCC) 10/09/2022   Unstable angina (HCC) 11/11/2021   Past Surgical History:  Procedure Laterality Date   CARDIAC CATHETERIZATION     CARDIAC SURGERY     COLONOSCOPY WITH PROPOFOL N/A 05/25/2017   Procedure: COLONOSCOPY WITH PROPOFOL;  Surgeon: Wyline Mood, MD;  Location: [facility] ENDOSCOPY;  Service: Gastroenterology;  Laterality: N/A;   CORONARY ARTERY BYPASS GRAFT     ESOPHAGOGASTRODUODENOSCOPY N/A 10/27/2017   Procedure: ESOPHAGOGASTRODUODENOSCOPY (EGD);  Surgeon: Toney Reil, MD;  Location: [facility] ENDOSCOPY;  Service: Gastroenterology;  Laterality: N/A;   ESOPHAGOGASTRODUODENOSCOPY (EGD) WITH PROPOFOL N/A 11/19/2017   Procedure: ESOPHAGOGASTRODUODENOSCOPY (EGD) WITH PROPOFOL;  Surgeon: Toney Reil, MD;  Location: [facility] ENDOSCOPY;  Service: Gastroenterology;  Laterality: N/A;   LEFT HEART CATH AND CORS/GRAFTS ANGIOGRAPHY N/A 10/14/2019   Procedure: LEFT HEART CATH AND CORS/GRAFTS ANGIOGRAPHY;  Surgeon: Lyn Records, MD;  Location: [facility] INVASIVE CV LAB;  Service: Cardiovascular;  Laterality: N/A;   LEFT HEART CATH AND CORS/GRAFTS ANGIOGRAPHY N/A 11/11/2021   Procedure: LEFT HEART CATH AND CORS/GRAFTS ANGIOGRAPHY;  Surgeon: Yvonne Kendall, MD;  Location: [facility] INVASIVE CV LAB;  Service: Cardiovascular;  Laterality: N/A;   LEFT HEART CATH AND CORS/GRAFTS ANGIOGRAPHY N/A 10/10/2022   Procedure: LEFT HEART CATH AND CORS/GRAFTS  ANGIOGRAPHY;  Surgeon: [place], Peter M, MD;  Location: [facility] INVASIVE CV LAB;  Service: Cardiovascular;  Laterality: N/A;    Allergies  No Known Allergies  History of Present Illness    Billy Cisneros. has a PMH of coronary artery disease status post CABG in 2000 (LIMA to LAD, RIMA to RCA, SVG to OM1, diagonal and ramus) underwent cardiac catheterization 10/2019 which showed patent LIMA-LAD, patent RIMA-PDA, total occluded distal left main, totally occluded ostial circumflex, totally occluded ostial LAD and normal LVEDP.  Medical management was recommended.  His PMH also includes HTN, anxiety, gout, and GERD.  He was seen in follow-up by Dr. Rennis Golden on 09/23/2019.  During that time he reported difficulty with chest discomfort.  He described exertional type chest pain with left arm heaviness.  His symptoms improved with rest.  It was noted that his LDL cholesterol was 62 but his triglycerides remained elevated at 320.  He underwent cardiac catheterization by Dr. Katrinka Blazing 6/21.  At that time his Imdur was uptitrated.  He presented to the clinic 10/30/20 for follow-up evaluation stated he had not noticed a difference in his chest discomfort with the addition of metoprolol.  He did not start his Imdur medication.  He reported that with increased physical activity he noticed chest discomfort.  He gave an example of taking out his trash.  When he would rest after increased activity his chest discomfort would ease off and go away.  We reviewed his angiography and he and his wife expressed understanding.  He reported that he generally was fairly sedentary.  He also reported that he did not eat  much fiber.  We reviewed his cholesterol panel and he reported that over the last several months he had not watched his diet as closely.  I had him start his Imdur, continue metoprolol, increase his physical activity, start fiber supplementation, and planned follow-up in 6 months.    He presented to the clinic 09/12/21 for  follow-up evaluation and stated he felt well.  He was unable to come to his last 2 appointments due to his daughter being in a car accident and then having flu.  We reviewed his current medication list and he is tolerating his medications well without side effects.  He did note some atypical chest discomfort at night when he laid down.  However, he denied exertional chest discomfort.  He had been walking on the treadmill daily and going to the [facility] 1 time per week.  He reported that he had been losing some weight.  I refilled his metoprolol, requested his updated lab work from his PCP and planned follow-up for 12 months.  He presented to the emergency department on 11/11/2021 with unstable angina.  He underwent left heart cath which showed severe coronary artery disease including subtotal chronic occlusion of the ostial LAD, proximal ramus, proximal circumflex and severe diffuse RCA disease.  He had a patent LIMA-LAD 30% stenosed, widely patent RIMA-RPDA, patent SVG-ramus, chronically occluded SVG-D.  Medical management was continued.  It was felt that running out of his isosorbide mononitrate brought on his recurrent angina.  He was discharged in stable condition on 11/12/2021.  He presents to the clinic today for follow-up evaluation states that he has been off of his Imdur for about 5 days.  He did not have a new prescription of sublingual nitroglycerin.  He reports that he had nitro spray in the past but never used it and his prescription was old.  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  He occasionally lifts heavier materials in and out of his [employer].  He does report a fall out of bed yesterday.  He reports he rolled the wrong way.  We reviewed the importance of heart healthy low-sodium diet.  We reviewed his angiography and he expressed understanding.  I will refill his sublingual nitroglycerin, continue his current medication regimen, have him maintain his physical activity, give the salty 6 diet  sheet, and plan follow-up in 4 to 6 months.  Today he denies chest pain, shortness of breath, lower extremity edema, fatigue, palpitations, melena, hematuria, hemoptysis, diaphoresis, weakness, presyncope, syncope, orthopnea, and PND.     Home Medications    Prior to Admission medications   Medication Sig Start Date End Date Taking? Authorizing Provider  ALPRAZolam (XANAX) 0.5 MG tablet TAKE 1 TABLET BY MOUTH 3 TIMES A DAY AS NEEDED FOR ANXIETY. Patient taking differently: Take 0.5 mg by mouth 3 (three) times daily as needed for anxiety. TAKE 1 TABLET BY MOUTH 3 TIMES A DAY AS NEEDED FOR ANXIETY. 06/24/17   Ellyn Hack, MD  aspirin EC 81 MG tablet Take 1 tablet (81 mg total) by mouth daily. For heart attack prevention. 11/22/11   Tamala Julian, PA-C  buPROPion (WELLBUTRIN XL) 300 MG 24 hr tablet Take 1 tablet (300 mg total) by mouth daily. 06/24/17   Ellyn Hack, MD  HYDROcodone-acetaminophen (NORCO/VICODIN) 5-325 MG tablet Take 1 tablet by mouth every 6 (six) hours as needed for moderate pain.  12/29/17   [provider]  icosapent Ethyl (VASCEPA) 1 g capsule Take 2 capsules (2  g total) by mouth 2 (two) times daily. 09/23/19   Hilty, Lisette Abu, MD  isosorbide mononitrate (IMDUR) 30 MG 24 hr tablet Take 1 tablet (30 mg total) by mouth daily. 10/14/19 10/13/20  Lyn Records, MD  lisinopril (PRINIVIL,ZESTRIL) 10 MG tablet Take 1 tablet (10 mg total) by mouth daily. 06/24/17   Ellyn Hack, MD  metoprolol succinate (TOPROL XL) 25 MG 24 hr tablet Take 1 tablet (25 mg total) by mouth daily. 10/14/19 10/13/20  Lyn Records, MD  Multiple Vitamins-Minerals (MULTIVITAMIN WITH MINERALS) tablet Take 1 tablet by mouth daily.    [provider]  omeprazole (PRILOSEC) 20 MG capsule Take 1 capsule (20 mg total) by mouth 2 (two) times daily before a meal. 06/24/17   Ellyn Hack, MD  simvastatin (ZOCOR) 40 MG tablet Take 1 tablet (40 mg total) by mouth at bedtime. 06/24/17    Ellyn Hack, MD  valACYclovir (VALTREX) 500 MG tablet Take 500 mg by mouth 2 (two) times daily as needed (outbreak).     [provider]  vitamin B-12 (CYANOCOBALAMIN) 500 MCG tablet Take 500 mcg by mouth daily.    [provider]  zolpidem (AMBIEN) 10 MG tablet Take 1 tablet (10 mg total) by mouth at bedtime. Patient taking differently: Take 10 mg by mouth at bedtime as needed for sleep.  06/24/17   Ellyn Hack, MD    Family History    Family History  Problem Relation Age of Onset   Heart attack Mother    Hyperlipidemia Mother    Hypertension Mother    Cancer Father        lung   Diabetes Sister    Diabetes Brother    Asthma Daughter    Cancer Maternal Grandmother    Stroke Maternal Grandfather    Heart attack Paternal Grandfather    He indicated that his mother is deceased. He indicated that his father is deceased. He indicated that his sister is alive. He indicated that his brother is alive. He indicated that his maternal grandmother is deceased. He indicated that his maternal grandfather is deceased. He indicated that his paternal grandmother is deceased. He indicated that his paternal grandfather is deceased. He indicated that his daughter is alive.   Social History    Social History   Socioeconomic History   Marital status: Married    Spouse name: Not on file   Number of children: Not on file   Years of education: Not on file   Highest education level: Not on file  Occupational History   Not on file  Tobacco Use   Smoking status: Never   Smokeless tobacco: Never  Vaping Use   Vaping Use: Never used  Substance and Sexual Activity   Alcohol use: Yes    Alcohol/week: 0.0 standard drinks of alcohol    Comment: 3 beer a month   Drug use: Yes    Frequency: 2.0 times per week    Types: Cocaine    Comment: last use 15-20 years per patient   Sexual activity: Yes    Birth control/protection: None  Other Topics Concern   Not on file   Social History Narrative   Not on file   Social Determinants of Health   Financial Resource Strain: Not on file  Food Insecurity: Not on file  Transportation Needs: Not on file  Physical Activity: Not on file  Stress: Not on file  Social Connections: Not on file  Intimate Partner Violence: Not on file     Review of Systems    General:  No chills, fever, night sweats or weight changes.  Cardiovascular:  No chest pain, dyspnea on exertion, edema, orthopnea, palpitations, paroxysmal nocturnal dyspnea. Dermatological: No rash, lesions/masses Respiratory: No cough, dyspnea Urologic: No hematuria, dysuria Abdominal:   No nausea, vomiting, diarrhea, bright red blood per rectum, melena, or hematemesis Neurologic:  No visual changes, wkns, changes in mental status. All other systems reviewed and are otherwise negative except as noted above.  Physical Exam    VS:  There were no vitals taken for this visit. , BMI There is no height or weight on file to calculate BMI. GEN: Well nourished, well developed, in no acute distress. HEENT: normal. Neck: Supple, no JVD, carotid bruits, or masses. Cardiac: RRR, no murmurs, rubs, or gallops. No clubbing, cyanosis, edema.  Radials/DP/PT 2+ and equal bilaterally.  Respiratory:  Respirations regular and unlabored, clear to auscultation bilaterally. GI: Soft, nontender, nondistended, BS + x 4. MS: no deformity or atrophy. Skin: warm and dry, no rash. Neuro:  Strength and sensation are intact. Psych: Normal affect.  Accessory Clinical Findings    Recent Labs: 11/11/2021: ALT 26 10/10/2022: Hemoglobin 13.4; Platelets 254 10/11/2022: BUN 16; Creatinine, Ser 1.43; Potassium 4.4; Sodium 135   Recent Lipid Panel    Component Value Date/Time   CHOL 130 10/10/2022 0941   CHOL 146 01/08/2015 0925   CHOL 141 08/06/2011 0327   TRIG 141 10/10/2022 0941   TRIG 375 (H) 08/06/2011 0327   HDL 43 10/10/2022 0941   HDL 42 01/08/2015 0925   HDL 25 (L)  08/06/2011 0327   CHOLHDL 3.0 10/10/2022 0941   VLDL 28 10/10/2022 0941   VLDL 75 (H) 08/06/2011 0327   LDLCALC 59 10/10/2022 0941   LDLCALC 76 01/08/2015 0925   LDLCALC 41 08/06/2011 0327    ECG personally reviewed by me today none today.  EKG 09/12/2021  normal sinus rhythm 80 bpm  Cardiac catheterization 10/14/2019 Patent saphenous vein graft to the ramus intermedius but occlusion occlusion of the saphenous vein graft limb to the obtuse marginal.   Patent LIMA to distal LAD Patent RIMA to PDA Totally occluded distal left main Totally occluded ostial circumflex Totally occluded ostial LAD Normal LV end-diastolic pressure.   RECOMMENDATIONS:   Toprol-XL 25 mg/day After 1 week if angina is still significant, add Imdur 30 mg/day. Further titrate anti-ischemic therapy until relief of symptoms. No interventional targets.    Diagnostic Dominance: Right    Intervention   Assessment & Plan   1.  Coronary artery disease-no chest***pain today.  Cardiac catheterization 11/11/21 showed severe coronary artery disease including subtotal chronic occlusion of the ostial LAD, proximal ramus, proximal circumflex and severe diffuse RCA disease.  He had a patent LIMA-LAD 30% stenosed, widely patent RIMA-RPDA, patent SVG-ramus, chronically occluded SVG-D.  Medical management was continued.  It was felt that running out of his isosorbide mononitrate caused recurrent angina. Continue aspirin, Lovaza,  Imdur, metoprolol, Crestor Heart healthy low-sodium diet Increase physical activity as tolerated  Essential hypertension-BP today 132***/78.  Well-controlled at home. Continue metoprolol, Imdur, lisinopril Heart healthy low-sodium diet Increase physical activity as tolerated  Hyperlipidemia-LDL 84/*on 05/21/2020 Continue Lovaza, Crestor Heart healthy low-sodium high-fiber diet Increase physical activity as tolerated Follows with PCP   Disposition: Follow-up with Dr. Rennis Golden in 4-6  months.  Thomasene Ripple. Waver Dibiasio NP-C    10/24/2022, 7:25 AM [facility] [address] Office (  [phone] Fax [phone]  Notice: This dictation was prepared with Dragon dictation along with smaller phrase technology. Any transcriptional errors that result from this process are unintentional and may not be corrected upon review.  I spent 13***minutes examining this patient, reviewing medications, and using patient centered shared decision making involving her cardiac care.  Prior to her visit I spent greater than 20 minutes reviewing her past medical history,  medications, and prior cardiac tests.

## 2022-10-27 ENCOUNTER — Ambulatory Visit: Payer: Medicare Other | Admitting: General Practice

## 2022-10-28 ENCOUNTER — Ambulatory Visit: Payer: Medicare Other

## 2022-10-31 ENCOUNTER — Encounter: Payer: Self-pay | Admitting: Internal Medicine

## 2022-11-16 ENCOUNTER — Other Ambulatory Visit: Payer: Self-pay | Admitting: Internal Medicine

## 2022-11-17 ENCOUNTER — Other Ambulatory Visit: Payer: Self-pay

## 2022-11-17 MED ORDER — ISOSORBIDE MONONITRATE ER 60 MG PO TB24: 60.0000 mg | ORAL_TABLET | Freq: Every day | ORAL | 3 refills | Status: DC

## 2022-12-11 NOTE — Progress Notes (Deleted)
Cardiology Office Note:  .   Date:  12/11/2022  ID:  Billy Cisneros., DOB [birth year], MRN [id number] PCP: Lynnea Ferrier, MD  [facility] Providers Cardiologist:  Chrystie Nose, MD { Click to update primary MD,subspecialty MD or APP then REFRESH:1}   History of Present Illness: Billy Cisneros. is a 72 y.o. male with past medical history of CAD (s/p CABG in 2000 with LIMA-LAD, RIMA-rPDA and SVG-OM1-D1-RI, cath in 10/2019 showing patent LIMA-LAD and RIMA-PDA with patent DVG-RI but occlusion of the limb to the OM, similar results by repeat cath in 11/2021), HTN, HLD, anxiety, GERD, spinal stenosis, Stage 3 CKD and gout.   He presented to the emergency department on 11/11/2021 with unstable angina.  He underwent left heart cath which showed severe coronary artery disease including subtotal chronic occlusion of the ostial LAD, proximal ramus, proximal circumflex and severe diffuse RCA disease.  He had a patent LIMA-LAD 30% stenosed, widely patent RIMA-RPDA, patent SVG-ramus, chronically occluded SVG-D.  Medical management was continued.  It was felt that running out of his isosorbide mononitrate brought on his recurrent angina.  He was discharged in stable condition on 11/12/2021. He was seen in office on follow up on 11/22/21, was stable from a cardiac perspective.   On 10/09/2022 he presented to the emergency room for worsening neck pain for the past few months.  CT spine showed no acute fracture but was noted to have moderate spinal stenosis along C4-C5 and severe bilateral neuroforaminal stenosis at C3-C4.  While in the ED he did report some intermittent chest pain as well, he was found to have an NSTEMI with high-sensitivity troponin trending up from 6>>461.  EKG showed no acute ST changes.  Catheterization on 10/10/2022 showed severe 3-vessel CAD with patent LIMA-LAD, patent RIMA-PDA and patent SVG-RI with normal LVEDP. No changes were noted when compared to his prior cath in  11/2021 and continued medical management was recommended. Echocardiogram showed a preserved EF of 55-60% with no regional WMA. Noted to have Grade 1 DD, normal RV function and trivial MR. Toprol-XL was titrated to 50 mg daily for additional antianginal benefit.    CAD: s/p CABG in 2000 with LIMA-LAD, RIMA-rPDA and SVG-OM1-D1-RI. Most recent cath on 10/10/22 showed severe 3-vessel CAD with patent LIMA-LAD, patent RIMA-PDA and patent SVG-RI with normal LVEDP. No changes were noted when compared to his prior cath in 11/2021 and continued medical management was recommended.  Angina? Heart healthy diet and regular cardiovascular exercise encouraged.   Continue aspirin, Imdur, Lisinopril, metoprolol succinate and rosuvastatin.   Essential HTN: Blood pressure today  Continue metoprolol, Imdur and lisinopril  Hyperlipidemia: Lipid profile on 10/10/22 indicated total cholesterol of 130, triglycerides 141 and LDL 59. Lipoprotein a 97.3.  Continue rosuvastatin   CKD stage III: Following catheterization in 09/2022 creatinine increased to 1.74, at time of discharged returned to his baseline of 1.43. Monitored by PCP.   ROS: ****** denies chest pain, shortness of breath, lower extremity edema, fatigue, palpitations, melena, hematuria, hemoptysis, diaphoresis, weakness, presyncope, syncope, orthopnea, and PND.   Studies Reviewed: .       Cardiac Studies & Procedures   CARDIAC CATHETERIZATION  CARDIAC CATHETERIZATION 10/10/2022  Narrative   Ost LM to Dist LM lesion is 50% stenosed.   Mid LAD lesion is 100% stenosed.   Ost LAD to Mid LAD lesion is 99% stenosed.   Ost Cx to Prox Cx lesion is 99% stenosed.   Origin lesion  is 30% stenosed.   Origin to Insertion lesion between Ramus and 3rd Mrg  is 100% stenosed.   Origin to Insertion lesion is 100% stenosed.   3rd Mrg lesion is 100% stenosed.   LIMA and is small.   SVG and is normal in caliber.   SVG.   LV end diastolic pressure is normal.  Severe 3  vessel obstructive CAD Patent LIMA to the LAD Patent RIMA to the PDA Patent SVG to the ramus intermedius Normal LVEDP  Plan: no change compared to prior cardiac cath in July 2023. Continue medical management.  Findings Coronary Findings Diagnostic  Dominance: Right  Left Main Ost LM to Dist LM lesion is 50% stenosed.  Left Anterior Descending There is severe diffuse disease throughout the vessel. Ost LAD to Mid LAD lesion is 99% stenosed. Mid LAD lesion is 100% stenosed.  Left Circumflex Ost Cx to Prox Cx lesion is 99% stenosed.  Third Obtuse Marginal Branch Collaterals 3rd Mrg filled by collaterals from Ramus.  3rd Mrg lesion is 100% stenosed.  Right Coronary Artery Vessel is moderate in size. There is severe diffuse disease throughout the vessel.  Right Posterior Descending Artery There is moderate disease in the vessel.  RIMA Graft To RPDA  LIMA LIMA Graft To Dist LAD LIMA and is small. Origin lesion is 30% stenosed.  Sequential Saphenous Graft To Ramus, 3rd Mrg SVG and is normal in caliber. Origin to Insertion lesion between Ramus and 3rd Mrg  is 100% stenosed.  Saphenous Graft To 1st Diag SVG. Origin to Insertion lesion is 100% stenosed.  Intervention  No interventions have been documented.   CARDIAC CATHETERIZATION  CARDIAC CATHETERIZATION 11/11/2021  Narrative Conclusions: Severe native coronary artery disease including total/subtotal chronic occlusions of the ostial LAD, proximal ramus, and proximal LCx as well as severe diffuse RCA disease. Patent LIMA-LAD with 30% ostial graft stenosis. Widely patent RIMA-rPDA. Patent SVG-ramus intermedius with chronic occlusion of jump segment from ramus to OM. Chronically occluded SVG-D. Upper normal left ventricular filling pressure (LVEDP 15 mmHg).  Recommendations: Continue medical therapy, as angiographic appearance of coronary arteries and bypass grafts is very similar to 2021.  It is possible that  running out of isosorbide mononitrate may have brought on recurrent angina. Aggressive secondary prevention. Follow-up echocardiogram.  Yvonne Kendall, MD [facility]  Findings Coronary Findings Diagnostic  Dominance: Right  Left Main Ost LM to Dist LM lesion is 50% stenosed.  Left Anterior Descending There is severe diffuse disease throughout the vessel. Ost LAD to Mid LAD lesion is 99% stenosed. Mid LAD lesion is 100% stenosed.  Left Circumflex Ost Cx to Prox Cx lesion is 99% stenosed.  Third Obtuse Marginal Branch Collaterals 3rd Mrg filled by collaterals from Ramus.  3rd Mrg lesion is 100% stenosed.  Right Coronary Artery Vessel is moderate in size. There is severe diffuse disease throughout the vessel.  Right Posterior Descending Artery There is moderate disease in the vessel.  RIMA Graft To RPDA  LIMA LIMA Graft To Dist LAD LIMA graft was visualized by angiography and is small. Origin lesion is 30% stenosed.  Sequential Saphenous Graft To Ramus, 3rd Mrg SVG graft was visualized by angiography and is normal in caliber. Origin to Insertion lesion between Ramus and 3rd Mrg  is 100% stenosed.  Saphenous Graft To 1st Diag SVG graft was visualized by angiography. Origin to Insertion lesion is 100% stenosed.  Intervention  No interventions have been documented.   STRESS TESTS  MYOCARDIAL PERFUSION IMAGING 10/03/2019  Narrative  The left ventricular ejection fraction is hyperdynamic (>65%).  Nuclear stress EF: 66%.  There was no ST segment deviation noted during stress.  No T wave inversion was noted during stress.  Findings consistent with ischemia.  This is an intermediate risk study.  1. There is a small (~3% of the LV), mild to moderate perfusion defect on stress imaging present in the apical inferior segment consistent with ischemia. There is also a medium size defect (6-8% of the LV) of mild severity present in the basal to mid  inferolateral segment consistent with ischemia. There is no evidence of infarction. 2. Normal LVEF, 66%. 3. This is an intermediate risk study.   ECHOCARDIOGRAM  ECHOCARDIOGRAM COMPLETE 10/10/2022  Narrative ECHOCARDIOGRAM REPORT    Patient Name:   Billy Cisneros. Date of Exam: 10/10/2022 Medical Rec #:  [id number]             Height:       65.0 in Accession #:    [id number]            Weight:       170.9 lb Date of Birth:  Oct 18, 1950            BSA:          1.850 m Patient Age:    71 years              BP:           119/78 mmHg Patient Gender: M                     HR:           94 bpm. Exam Location:  Inpatient  Procedure: 2D Echo, Color Doppler and Cardiac Doppler  Indications:    R07.9* Chest pain, unspecified  History:        Patient has prior history of Echocardiogram examinations, most recent 12/21/2014. Prior CABG; Risk Factors:Hypertension and Dyslipidemia.  Sonographer:    Irving Burton Senior RDCS Referring Phys: [phone] HAO MENG  IMPRESSIONS   1. Left ventricular ejection fraction, by estimation, is 55 to 60%. The left ventricle has normal function. The left ventricle has no regional wall motion abnormalities. Left ventricular diastolic parameters are consistent with Grade I diastolic dysfunction (impaired relaxation). 2. Right ventricular systolic function is normal. The right ventricular size is normal. Tricuspid regurgitation signal is inadequate for assessing PA pressure. 3. The mitral valve is normal in structure. Trivial mitral valve regurgitation. No evidence of mitral stenosis. 4. The aortic valve is tricuspid. Aortic valve regurgitation is not visualized. No aortic stenosis is present. 5. The inferior vena cava is normal in size with greater than 50% respiratory variability, suggesting right atrial pressure of 3 mmHg.  FINDINGS Left Ventricle: Left ventricular ejection fraction, by estimation, is 55 to 60%. The left ventricle has normal function. The left  ventricle has no regional wall motion abnormalities. The left ventricular internal cavity size was normal in size. There is no left ventricular hypertrophy. Left ventricular diastolic parameters are consistent with Grade I diastolic dysfunction (impaired relaxation). Indeterminate filling pressures.  Right Ventricle: The right ventricular size is normal. No increase in right ventricular wall thickness. Right ventricular systolic function is normal. Tricuspid regurgitation signal is inadequate for assessing PA pressure.  Left Atrium: Left atrial size was normal in size.  Right Atrium: Right atrial size was normal in size.  Pericardium: Trivial pericardial effusion is present. Presence of epicardial fat layer.  Mitral Valve: The mitral  valve is normal in structure. Trivial mitral valve regurgitation. No evidence of mitral valve stenosis.  Tricuspid Valve: The tricuspid valve is normal in structure. Tricuspid valve regurgitation is trivial. No evidence of tricuspid stenosis.  Aortic Valve: The aortic valve is tricuspid. Aortic valve regurgitation is not visualized. No aortic stenosis is present.  Pulmonic Valve: The pulmonic valve was normal in structure. Pulmonic valve regurgitation is not visualized. No evidence of pulmonic stenosis.  Aorta: The aortic root and ascending aorta are structurally normal, with no evidence of dilitation.  Venous: The inferior vena cava is normal in size with greater than 50% respiratory variability, suggesting right atrial pressure of 3 mmHg.  IAS/Shunts: The interatrial septum was not well visualized.   LEFT VENTRICLE PLAX 2D LVIDd:         3.90 cm   Diastology LVIDs:         2.80 cm   LV e' medial:    9.25 cm/s LV PW:         1.10 cm   LV E/e' medial:  10.0 LV IVS:        0.90 cm   LV e' lateral:   10.90 cm/s LVOT diam:     1.90 cm   LV E/e' lateral: 8.5 LV SV:         62 LV SV Index:   34 LVOT Area:     2.84 cm   RIGHT VENTRICLE RV S prime:      12.20 cm/s TAPSE (M-mode): 1.6 cm  LEFT ATRIUM             Index        RIGHT ATRIUM           Index LA diam:        4.00 cm 2.16 cm/m   RA Area:     14.30 cm LA Vol (A2C):   20.0 ml 10.81 ml/m  RA Volume:   33.20 ml  17.94 ml/m LA Vol (A4C):   36.7 ml 19.84 ml/m LA Biplane Vol: 27.3 ml 14.75 ml/m AORTIC VALVE LVOT Vmax:   123.00 cm/s LVOT Vmean:  88.400 cm/s LVOT VTI:    0.219 m  AORTA Ao Root diam: 2.50 cm Ao Asc diam:  2.90 cm  MITRAL VALVE MV Area (PHT): 2.62 cm     SHUNTS MV Decel Time: 290 msec     Systemic VTI:  0.22 m MV E velocity: 92.20 cm/s   Systemic Diam: 1.90 cm MV A velocity: 128.00 cm/s MV E/A ratio:  0.72  Chilton Si MD Electronically signed by Chilton Si MD Signature Date/Time: 10/10/2022/10:50:35 AM    Final             *** Risk Assessment/Calculations:   {Does this patient have ATRIAL FIBRILLATION?:[phone]} No BP recorded.  {Refresh Note OR Click here to enter BP  :1}***       Physical Exam:   VS:  There were no vitals taken for this visit.   Wt Readings from Last 3 Encounters:  10/09/22 170 lb 14.4 oz (77.5 kg)  10/03/22 173 lb 9.6 oz (78.7 kg)  11/22/21 175 lb (79.4 kg)    GEN: Well nourished, well developed in no acute distress NECK: No JVD; No carotid bruits CARDIAC: ***RRR, no murmurs, rubs, gallops RESPIRATORY:  Clear to auscultation without rales, wheezing or rhonchi  ABDOMEN: Soft, non-tender, non-distended EXTREMITIES:  No edema; No deformity   ASSESSMENT AND PLAN: .   *** {The patient has an active order  for outpatient cardiac rehabilitation.   Please indicate if the patient is ready to start. Do NOT delete this.  It will auto delete.  Refresh note, then sign.              Click here to document readiness and see contraindications.  :1}  Cardiac Rehabilitation Eligibility Assessment      {Are you ordering a CV Procedure (e.g. stress test, cath, DCCV, TEE, etc)?   Press F2        :[id number]}  Dispo:  ***  Signed, Rip Harbour, NP

## 2022-12-12 ENCOUNTER — Ambulatory Visit: Payer: Medicare Other | Admitting: General Practice

## 2022-12-12 DIAGNOSIS — Z951 Presence of aortocoronary bypass graft: Secondary | ICD-10-CM

## 2022-12-12 DIAGNOSIS — I10 Essential (primary) hypertension: Secondary | ICD-10-CM

## 2022-12-12 DIAGNOSIS — E782 Mixed hyperlipidemia: Secondary | ICD-10-CM

## 2022-12-12 DIAGNOSIS — N1831 Chronic kidney disease, stage 3a: Secondary | ICD-10-CM

## 2022-12-12 DIAGNOSIS — I2510 Atherosclerotic heart disease of native coronary artery without angina pectoris: Secondary | ICD-10-CM

## 2023-01-26 ENCOUNTER — Other Ambulatory Visit: Payer: Self-pay | Admitting: Internal Medicine

## 2023-01-27 ENCOUNTER — Ambulatory Visit: Payer: Medicare Other | Admitting: General Practice

## 2023-02-12 ENCOUNTER — Other Ambulatory Visit: Payer: Self-pay | Admitting: Internal Medicine

## 2023-02-12 DIAGNOSIS — G959 Disease of spinal cord, unspecified: Secondary | ICD-10-CM

## 2023-02-17 ENCOUNTER — Ambulatory Visit
Admission: RE | Admit: 2023-02-17 | Discharge: 2023-02-17 | Disposition: A | Discharge status: Home / Self Care | Payer: Medicare Other | Source: Ambulatory Visit | Attending: Internal Medicine | Admitting: Internal Medicine

## 2023-02-17 DIAGNOSIS — G959 Disease of spinal cord, unspecified: Secondary | ICD-10-CM | POA: Diagnosis present

## 2023-02-25 NOTE — Progress Notes (Deleted)
Cardiology Clinic Note   Patient Name: Billy Cisneros. Date of Encounter: 02/25/2023  Primary Care Provider:  Lynnea Ferrier, MD Primary Cardiologist:  Chrystie Nose, MD  Patient Profile    Billy Cisneros. 72 year old male presents the clinic today for follow-up evaluation of his coronary artery disease .  Past Medical History    Past Medical History:  Diagnosis Date   Anxiety    Coronary artery disease    Depression    GERD (gastroesophageal reflux disease)    Gout    Hypertension    NSTEMI (non-ST elevated myocardial infarction) (HCC) 10/09/2022   Unstable angina (HCC) 11/11/2021   Past Surgical History:  Procedure Laterality Date   CARDIAC CATHETERIZATION     CARDIAC SURGERY     COLONOSCOPY WITH PROPOFOL N/A 05/25/2017   Procedure: COLONOSCOPY WITH PROPOFOL;  Surgeon: Wyline Mood, MD;  Location: [facility] ENDOSCOPY;  Service: Gastroenterology;  Laterality: N/A;   CORONARY ARTERY BYPASS GRAFT     ESOPHAGOGASTRODUODENOSCOPY N/A 10/27/2017   Procedure: ESOPHAGOGASTRODUODENOSCOPY (EGD);  Surgeon: Toney Reil, MD;  Location: [facility] ENDOSCOPY;  Service: Gastroenterology;  Laterality: N/A;   ESOPHAGOGASTRODUODENOSCOPY (EGD) WITH PROPOFOL N/A 11/19/2017   Procedure: ESOPHAGOGASTRODUODENOSCOPY (EGD) WITH PROPOFOL;  Surgeon: Toney Reil, MD;  Location: [facility] ENDOSCOPY;  Service: Gastroenterology;  Laterality: N/A;   LEFT HEART CATH AND CORS/GRAFTS ANGIOGRAPHY N/A 10/14/2019   Procedure: LEFT HEART CATH AND CORS/GRAFTS ANGIOGRAPHY;  Surgeon: Lyn Records, MD;  Location: [facility] INVASIVE CV LAB;  Service: Cardiovascular;  Laterality: N/A;   LEFT HEART CATH AND CORS/GRAFTS ANGIOGRAPHY N/A 11/11/2021   Procedure: LEFT HEART CATH AND CORS/GRAFTS ANGIOGRAPHY;  Surgeon: Yvonne Kendall, MD;  Location: [facility] INVASIVE CV LAB;  Service: Cardiovascular;  Laterality: N/A;   LEFT HEART CATH AND CORS/GRAFTS ANGIOGRAPHY N/A 10/10/2022   Procedure: LEFT HEART CATH AND CORS/GRAFTS  ANGIOGRAPHY;  Surgeon: Swaz[place], Peter M, MD;  Location: [facility] INVASIVE CV LAB;  Service: Cardiovascular;  Laterality: N/A;    Allergies  No Known Allergies  History of Present Illness    Billy Cisneros. has a PMH of coronary artery disease status post CABG in 2000 (LIMA to LAD, RIMA to RCA, SVG to OM1, diagonal and ramus) underwent cardiac catheterization 10/2019 which showed patent LIMA-LAD, patent RIMA-PDA, total occluded distal left main, totally occluded ostial circumflex, totally occluded ostial LAD and normal LVEDP.  Medical management was recommended.  His PMH also includes HTN, anxiety, gout, and GERD.  He was seen in follow-up by Dr. Rennis Golden on 09/23/2019.  During that time he reported difficulty with chest discomfort.  He described exertional type chest pain with left arm heaviness.  His symptoms improved with rest.  It was noted that his LDL cholesterol was 62 but his triglycerides remained elevated at 320.  He underwent cardiac catheterization by Dr. Katrinka Blazing 6/21.  At that time his Imdur was uptitrated.  He presented to the clinic 10/30/20 for follow-up evaluation stated he had not noticed a difference in his chest discomfort with the addition of metoprolol.  He did not start his Imdur medication.  He reported that with increased physical activity he noticed chest discomfort.  He gave an example of taking out his trash.  When he would rest after increased activity his chest discomfort would ease off and go away.  We reviewed his angiography and he and his wife expressed understanding.  He reported that he generally was fairly sedentary.  He also reported that he did not eat  much fiber.  We reviewed his cholesterol panel and he reported that over the last several months he had not watched his diet as closely.  I had him start his Imdur, continue metoprolol, increase his physical activity, start fiber supplementation, and planned follow-up in 6 months.    He presented to the clinic 09/12/21 for  follow-up evaluation and stated he felt well.  He was unable to come to his last 2 appointments due to his daughter being in a car accident and then having flu.  We reviewed his current medication list and he is tolerating his medications well without side effects.  He did note some atypical chest discomfort at night when he laid down.  However, he denied exertional chest discomfort.  He had been walking on the treadmill daily and going to the [facility] 1 time per week.  He reported that he had been losing some weight.  I refilled his metoprolol, requested his updated lab work from his PCP and planned follow-up for 12 months.  He presented to the emergency department on 11/11/2021 with unstable angina.  He underwent left heart cath which showed severe coronary artery disease including subtotal chronic occlusion of the ostial LAD, proximal ramus, proximal circumflex and severe diffuse RCA disease.  He had a patent LIMA-LAD 30% stenosed, widely patent RIMA-RPDA, patent SVG-ramus, chronically occluded SVG-D.  Medical management was continued.  It was felt that running out of his isosorbide mononitrate brought on his recurrent angina.  He was discharged in stable condition on 11/12/2021.  He presented to the clinic 11/22/21 for follow-up evaluation stated that he had been off of his Imdur for about 5 days.  He did not have a new prescription of sublingual nitroglycerin.  He reported that he had nitro spray in the past but never used it and his prescription was old.  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  He occasionally lifted heavier materials in and out of his Billy Cisneros.  He did report a fall out of bed .  He reported he rolled the wrong way.  We reviewed the importance of heart healthy low-sodium diet.  We reviewed his angiography and he expressed understanding.  I  refilled his sublingual nitroglycerin, continued his current medication regimen, had him maintain his physical activity, gave the salty 6 diet sheet, and  planned follow-up in 4 to 6 months.  He was seen in follow-up by Dr. Bjorn Pippin on 10/11/2022 during admission.  He had been admitted with chest discomfort.Marland Kitchen  He denied chest pain.  His blood pressure was well-controlled.  Echocardiogram was reassuring.  Cardiac catheterization 10/10/2022 showed severe three-vessel obstructive CAD, patent LIMA, patent RIMA, and patent SVG to ramus intermedius with normal LVEDP.  Medical management was recommended.  He was continued on lisinopril, Imdur, and metoprolol.  He presents to the clinic today for follow-up evaluation and states***.  Today he denies chest pain, shortness of breath, lower extremity edema, fatigue, palpitations, melena, hematuria, hemoptysis, diaphoresis, weakness, presyncope, syncope, orthopnea, and PND.     Home Medications    Prior to Admission medications   Medication Sig Start Date End Date Taking? Authorizing Provider  ALPRAZolam (XANAX) 0.5 MG tablet TAKE 1 TABLET BY MOUTH 3 TIMES A DAY AS NEEDED FOR ANXIETY. Patient taking differently: Take 0.5 mg by mouth 3 (three) times daily as needed for anxiety. TAKE 1 TABLET BY MOUTH 3 TIMES A DAY AS NEEDED FOR ANXIETY. 06/24/17   Ellyn Hack, MD  aspirin EC 81 MG tablet  Take 1 tablet (81 mg total) by mouth daily. For heart attack prevention. 11/22/11   Tamala Julian, PA-C  buPROPion (WELLBUTRIN XL) 300 MG 24 hr tablet Take 1 tablet (300 mg total) by mouth daily. 06/24/17   Ellyn Hack, MD  HYDROcodone-acetaminophen (NORCO/VICODIN) 5-325 MG tablet Take 1 tablet by mouth every 6 (six) hours as needed for moderate pain.  12/29/17   [provider]  icosapent Ethyl (VASCEPA) 1 g capsule Take 2 capsules (2 g total) by mouth 2 (two) times daily. 09/23/19   Hilty, Lisette Abu, MD  isosorbide mononitrate (IMDUR) 30 MG 24 hr tablet Take 1 tablet (30 mg total) by mouth daily. 10/14/19 10/13/20  Lyn Records, MD  lisinopril (PRINIVIL,ZESTRIL) 10 MG tablet Take 1 tablet (10 mg total) by mouth  daily. 06/24/17   Ellyn Hack, MD  metoprolol succinate (TOPROL XL) 25 MG 24 hr tablet Take 1 tablet (25 mg total) by mouth daily. 10/14/19 10/13/20  Lyn Records, MD  Multiple Vitamins-Minerals (MULTIVITAMIN WITH MINERALS) tablet Take 1 tablet by mouth daily.    [provider]  omeprazole (PRILOSEC) 20 MG capsule Take 1 capsule (20 mg total) by mouth 2 (two) times daily before a meal. 06/24/17   Ellyn Hack, MD  simvastatin (ZOCOR) 40 MG tablet Take 1 tablet (40 mg total) by mouth at bedtime. 06/24/17   Ellyn Hack, MD  valACYclovir (VALTREX) 500 MG tablet Take 500 mg by mouth 2 (two) times daily as needed (outbreak).     [provider]  vitamin B-12 (CYANOCOBALAMIN) 500 MCG tablet Take 500 mcg by mouth daily.    [provider]  zolpidem (AMBIEN) 10 MG tablet Take 1 tablet (10 mg total) by mouth at bedtime. Patient taking differently: Take 10 mg by mouth at bedtime as needed for sleep.  06/24/17   Ellyn Hack, MD    Family History    Family History  Problem Relation Age of Onset   Heart attack Mother    Hyperlipidemia Mother    Hypertension Mother    Cancer Father        lung   Diabetes Sister    Diabetes Brother    Asthma Daughter    Cancer Maternal Grandmother    Stroke Maternal Grandfather    Heart attack Paternal Grandfather    He indicated that his mother is deceased. He indicated that his father is deceased. He indicated that his sister is alive. He indicated that his brother is alive. He indicated that his maternal grandmother is deceased. He indicated that his maternal grandfather is deceased. He indicated that his paternal grandmother is deceased. He indicated that his paternal grandfather is deceased. He indicated that his daughter is alive.   Social History    Social History   Socioeconomic History   Marital status: Married    Spouse name: Not on file   Number of children: Not on file   Years of education: Not on file    Highest education level: Not on file  Occupational History   Not on file  Tobacco Use   Smoking status: Never   Smokeless tobacco: Never  Vaping Use   Vaping status: Never Used  Substance and Sexual Activity   Alcohol use: Yes    Alcohol/week: 0.0 standard drinks of alcohol    Comment: 3 beer a month   Drug use: Yes    Frequency: 2.0 times per week  Types: Cocaine    Comment: last use 15-20 years per patient   Sexual activity: Yes    Birth control/protection: None  Other Topics Concern   Not on file  Social History Narrative   Not on file   Social Determinants of Health   Financial Resource Strain: Low Risk  (02/05/2023)   Received from [facility]   Overall Financial Resource Strain (CARDIA)    Difficulty of Paying Living Expenses: Not hard at all  Food Insecurity: No Food Insecurity (02/05/2023)   Received from [facility]   Hunger Vital Sign    Worried About Running Out of Food in the Last Year: Never true    Ran Out of Food in the Last Year: Never true  Transportation Needs: No Transportation Needs (02/05/2023)   Received from [facility] - Transportation    In the past 12 months, has lack of transportation kept you from medical appointments or from getting medications?: No    Lack of Transportation (Non-Medical): No  Physical Activity: Not on file  Stress: Not on file  Social Connections: Not on file  Intimate Partner Violence: Not on file     Review of Systems    General:  No chills, fever, night sweats or weight changes.  Cardiovascular:  No chest pain, dyspnea on exertion, edema, orthopnea, palpitations, paroxysmal nocturnal dyspnea. Dermatological: No rash, lesions/masses Respiratory: No cough, dyspnea Urologic: No hematuria, dysuria Abdominal:   No nausea, vomiting, diarrhea, bright red blood per rectum, melena, or hematemesis Neurologic:  No visual changes, wkns, changes in mental  status. All other systems reviewed and are otherwise negative except as noted above.  Physical Exam    VS:  There were no vitals taken for this visit. , BMI There is no height or weight on file to calculate BMI. GEN: Well nourished, well developed, in no acute distress. HEENT: normal. Neck: Supple, no JVD, carotid bruits, or masses. Cardiac: RRR, no murmurs, rubs, or gallops. No clubbing, cyanosis, edema.  Radials/DP/PT 2+ and equal bilaterally.  Respiratory:  Respirations regular and unlabored, clear to auscultation bilaterally. GI: Soft, nontender, nondistended, BS + x 4. MS: no deformity or atrophy. Skin: warm and dry, no rash. Neuro:  Strength and sensation are intact. Psych: Normal affect.  Accessory Clinical Findings    Recent Labs: 10/10/2022: Hemoglobin 13.4; Platelets 254 10/11/2022: BUN 16; Creatinine, Ser 1.43; Potassium 4.4; Sodium 135   Recent Lipid Panel    Component Value Date/Time   CHOL 130 10/10/2022 0941   CHOL 146 01/08/2015 0925   CHOL 141 08/06/2011 0327   TRIG 141 10/10/2022 0941   TRIG 375 (H) 08/06/2011 0327   HDL 43 10/10/2022 0941   HDL 42 01/08/2015 0925   HDL 25 (L) 08/06/2011 0327   CHOLHDL 3.0 10/10/2022 0941   VLDL 28 10/10/2022 0941   VLDL 75 (H) 08/06/2011 0327   LDLCALC 59 10/10/2022 0941   LDLCALC 76 01/08/2015 0925   LDLCALC 41 08/06/2011 0327    ECG personally reviewed by me today none today.  EKG 09/12/2021  normal sinus rhythm 80 bpm  Cardiac catheterization 10/14/2019 Patent saphenous vein graft to the ramus intermedius but occlusion occlusion of the saphenous vein graft limb to the obtuse marginal.   Patent LIMA to distal LAD Patent RIMA to PDA Totally occluded distal left main Totally occluded ostial circumflex Totally occluded ostial LAD Normal LV end-diastolic pressure.   RECOMMENDATIONS:   Toprol-XL 25 mg/day After 1  week if angina is still significant, add Imdur 30 mg/day. Further titrate anti-ischemic therapy until  relief of symptoms. No interventional targets.    Diagnostic Dominance: Right    Intervention  Echocardiogram 10/10/2022  IMPRESSIONS     1. Left ventricular ejection fraction, by estimation, is 55 to 60%. The  left ventricle has normal function. The left ventricle has no regional  wall motion abnormalities. Left ventricular diastolic parameters are  consistent with Grade I diastolic  dysfunction (impaired relaxation).   2. Right ventricular systolic function is normal. The right ventricular  size is normal. Tricuspid regurgitation signal is inadequate for assessing  PA pressure.   3. The mitral valve is normal in structure. Trivial mitral valve  regurgitation. No evidence of mitral stenosis.   4. The aortic valve is tricuspid. Aortic valve regurgitation is not  visualized. No aortic stenosis is present.   5. The inferior vena cava is normal in size with greater than 50%  respiratory variability, suggesting right atrial pressure of 3 mmHg.   FINDINGS   Left Ventricle: Left ventricular ejection fraction, by estimation, is 55  to 60%. The left ventricle has normal function. The left ventricle has no  regional wall motion abnormalities. The left ventricular internal cavity  size was normal in size. There is   no left ventricular hypertrophy. Left ventricular diastolic parameters  are consistent with Grade I diastolic dysfunction (impaired relaxation).  Indeterminate filling pressures.   Right Ventricle: The right ventricular size is normal. No increase in  right ventricular wall thickness. Right ventricular systolic function is  normal. Tricuspid regurgitation signal is inadequate for assessing PA  pressure.   Left Atrium: Left atrial size was normal in size.   Right Atrium: Right atrial size was normal in size.   Pericardium: Trivial pericardial effusion is present. Presence of  epicardial fat layer.   Mitral Valve: The mitral valve is normal in structure. Trivial  mitral  valve regurgitation. No evidence of mitral valve stenosis.   Tricuspid Valve: The tricuspid valve is normal in structure. Tricuspid  valve regurgitation is trivial. No evidence of tricuspid stenosis.   Aortic Valve: The aortic valve is tricuspid. Aortic valve regurgitation is  not visualized. No aortic stenosis is present.   Pulmonic Valve: The pulmonic valve was normal in structure. Pulmonic valve  regurgitation is not visualized. No evidence of pulmonic stenosis.   Aorta: The aortic root and ascending aorta are structurally normal, with  no evidence of dilitation.   Venous: The inferior vena cava is normal in size with greater than 50%  respiratory variability, suggesting right atrial pressure of 3 mmHg.   IAS/Shunts: The interatrial septum was not well visualized.    Cardiac catheterization 10/10/2022    Ost LM to Dist LM lesion is 50% stenosed.   Mid LAD lesion is 100% stenosed.   Ost LAD to Mid LAD lesion is 99% stenosed.   Ost Cx to Prox Cx lesion is 99% stenosed.   Origin lesion is 30% stenosed.   Origin to Insertion lesion between Ramus and 3rd Mrg  is 100% stenosed.   Origin to Insertion lesion is 100% stenosed.   3rd Mrg lesion is 100% stenosed.   LIMA and is small.   SVG and is normal in caliber.   SVG.   LV end diastolic pressure is normal.   Severe 3 vessel obstructive CAD Patent LIMA to the LAD Patent RIMA to the PDA Patent SVG to the ramus intermedius Normal LVEDP   Plan: no  change compared to prior cardiac cath in July 2023. Continue medical management.  Assessment & Plan   1.  Coronary artery disease-no chest pain today.  Admitted to the hospital 10/09/2022 and discharged on 10/11/2022.  Cardiac catheterization 10/10/2022 showed patent bypass grafts.  Medical management was continued.  I Continue current medical therapy Heart healthy low-sodium diet Increase physical activity as tolerated  Essential hypertension-BP today 132/78.  Well-controlled  at home. Continue metoprolol, Imdur, lisinopril Heart healthy low-sodium diet Increase physical activity as tolerated  Hyperlipidemia-LDL***84 on 05/21/2020 Continue Lovaza, Crestor High-fiber diet Follows with PCP   Disposition: Follow-up with Dr. Rennis Golden in 4-6 months.  Thomasene Ripple. Angila Wombles NP-C    02/25/2023, 8:47 AM [facility] [address] Office [phone] Fax [phone]  Notice: This dictation was prepared with Dragon dictation along with smaller phrase technology. Any transcriptional errors that result from this process are unintentional and may not be corrected upon review.  I spent 13*** minutes examining this patient, reviewing medications, and using patient centered shared decision making involving her cardiac care.  Prior to her visit I spent greater than 20 minutes reviewing her past medical history,  medications, and prior cardiac tests.

## 2023-03-06 ENCOUNTER — Ambulatory Visit: Payer: Medicare Other | Admitting: General Practice

## 2023-03-09 NOTE — Progress Notes (Deleted)
Referring Physician:  Lynnea Ferrier, MD [address] [facility] [place],  [place] [address]  Primary Physician:  Lynnea Ferrier, MD  History of Present Illness: 03/09/2023 [name] is here today with a chief complaint of ***  Low back pain with bilateral leg weakness with several falls recently. He states his legs give out on him.   Duration: 1 month Location: *** Quality: leg weakness, numbness on lateral  Severity: ***  Precipitating: aggravated by walking?  Modifying factors: made better by ***? Weakness: none Timing: constant? Bowel/Bladder Dysfunction: none  Conservative measures:  Physical therapy: has not participated in PT  Multimodal medical therapy including regular antiinflammatories: Prenisone, Methocarbamol, Hydrocodone Injections: no epidural steroid injections  Past Surgery: none  [name]. has ***no symptoms of cervical myelopathy.  The symptoms are causing a significant impact on the patient's life.   I have utilized the care everywhere function in epic to review the outside records available from external health systems.  Review of Systems:  A 10 point review of systems is negative, except for the pertinent positives and negatives detailed in the HPI.  Past Medical History: Past Medical History:  Diagnosis Date   Anxiety    Coronary artery disease    Depression    GERD (gastroesophageal reflux disease)    Gout    Hypertension    NSTEMI (non-ST elevated myocardial infarction) (HCC) 10/09/2022   Unstable angina (HCC) 11/11/2021    Past Surgical History: Past Surgical History:  Procedure Laterality Date   CARDIAC CATHETERIZATION     CARDIAC SURGERY     COLONOSCOPY WITH PROPOFOL N/A 05/25/2017   Procedure: COLONOSCOPY WITH PROPOFOL;  Surgeon: Wyline Mood, MD;  Location: [facility] ENDOSCOPY;  Service: Gastroenterology;  Laterality: N/A;   CORONARY ARTERY BYPASS GRAFT     ESOPHAGOGASTRODUODENOSCOPY N/A  10/27/2017   Procedure: ESOPHAGOGASTRODUODENOSCOPY (EGD);  Surgeon: Toney Reil, MD;  Location: [facility] ENDOSCOPY;  Service: Gastroenterology;  Laterality: N/A;   ESOPHAGOGASTRODUODENOSCOPY (EGD) WITH PROPOFOL N/A 11/19/2017   Procedure: ESOPHAGOGASTRODUODENOSCOPY (EGD) WITH PROPOFOL;  Surgeon: Toney Reil, MD;  Location: [facility] ENDOSCOPY;  Service: Gastroenterology;  Laterality: N/A;   LEFT HEART CATH AND CORS/GRAFTS ANGIOGRAPHY N/A 10/14/2019   Procedure: LEFT HEART CATH AND CORS/GRAFTS ANGIOGRAPHY;  Surgeon: Lyn Records, MD;  Location: [facility] INVASIVE CV LAB;  Service: Cardiovascular;  Laterality: N/A;   LEFT HEART CATH AND CORS/GRAFTS ANGIOGRAPHY N/A 11/11/2021   Procedure: LEFT HEART CATH AND CORS/GRAFTS ANGIOGRAPHY;  Surgeon: Yvonne Kendall, MD;  Location: [facility] INVASIVE CV LAB;  Service: Cardiovascular;  Laterality: N/A;   LEFT HEART CATH AND CORS/GRAFTS ANGIOGRAPHY N/A 10/10/2022   Procedure: LEFT HEART CATH AND CORS/GRAFTS ANGIOGRAPHY;  Surgeon: Swaziland, Peter M, MD;  Location: [facility] INVASIVE CV LAB;  Service: Cardiovascular;  Laterality: N/A;    Allergies: Allergies as of 03/10/2023   (No Known Allergies)    Medications:  Current Outpatient Medications:    allopurinol (ZYLOPRIM) 100 MG tablet, Take 100 mg by mouth as needed (gout flare up)., Disp: , Rfl:    ALPRAZolam (XANAX) 0.5 MG tablet, TAKE 1 TABLET BY MOUTH 3 TIMES A DAY AS NEEDED FOR ANXIETY., Disp: 90 tablet, Rfl: 2   aspirin EC 81 MG tablet, Take 1 tablet (81 mg total) by mouth daily. For heart attack prevention., Disp: , Rfl:    buPROPion (WELLBUTRIN XL) 300 MG 24 hr tablet, Take 1 tablet (300 mg total) by mouth daily., Disp: 90 tablet, Rfl: 0  colchicine 0.6 MG tablet, Take 0.6 mg by mouth daily as needed (gout)., Disp: , Rfl:    HYDROcodone-acetaminophen (NORCO/VICODIN) 5-325 MG tablet, Take 1 tablet by mouth every 6 (six) hours as needed for moderate pain., Disp: , Rfl:    isosorbide mononitrate (IMDUR) 60 MG 24 hr  tablet, Take 1 tablet (60 mg total) by mouth daily., Disp: 90 tablet, Rfl: 3   lisinopril (PRINIVIL,ZESTRIL) 10 MG tablet, Take 1 tablet (10 mg total) by mouth daily., Disp: 90 tablet, Rfl: 0   metoprolol succinate (TOPROL XL) 50 MG 24 hr tablet, Take 1 tablet (50 mg total) by mouth daily for 180 doses., Disp: 90 tablet, Rfl: 1   Multiple Vitamins-Minerals (MULTIVITAMIN WITH MINERALS) tablet, Take 1 tablet by mouth at bedtime., Disp: , Rfl:    nitroGLYCERIN (NITROSTAT) 0.4 MG SL tablet, Place 1 tablet (0.4 mg total) under the tongue every 5 (five) minutes as needed for chest pain., Disp: 25 tablet, Rfl: 3   omega-3 acid ethyl esters (LOVAZA) 1 g capsule, TAKE ONE CAPSULE BY MOUTH TWICE A DAY, Disp: 90 capsule, Rfl: 3   omeprazole (PRILOSEC) 20 MG capsule, Take 1 capsule (20 mg total) by mouth 2 (two) times daily before a meal. (Patient taking differently: Take 40 mg by mouth daily.), Disp: 180 capsule, Rfl: 0   predniSONE (DELTASONE) 10 MG tablet, Take by mouth as needed., Disp: , Rfl:    rosuvastatin (CRESTOR) 20 MG tablet, Take 1 tablet (20 mg total) by mouth at bedtime., Disp: 90 tablet, Rfl: 3   valACYclovir (VALTREX) 500 MG tablet, Take 500 mg by mouth 2 (two) times daily as needed (outbreak). , Disp: , Rfl:    zolpidem (AMBIEN) 10 MG tablet, Take 1 tablet (10 mg total) by mouth at bedtime., Disp: 30 tablet, Rfl: 2  Social History: Social History   Tobacco Use   Smoking status: Never   Smokeless tobacco: Never  Vaping Use   Vaping status: Never Used  Substance Use Topics   Alcohol use: Yes    Alcohol/week: 0.0 standard drinks of alcohol    Comment: 3 beer a month   Drug use: Yes    Frequency: 2.0 times per week    Types: Cocaine    Comment: last use 15-20 years per patient    Family Medical History: Family History  Problem Relation Age of Onset   Heart attack Mother    Hyperlipidemia Mother    Hypertension Mother    Cancer Father        lung   Diabetes Sister    Diabetes  Brother    Asthma Daughter    Cancer Maternal Grandmother    Stroke Maternal Grandfather    Heart attack Paternal Grandfather     Physical Examination: There were no vitals filed for this visit.  General: Patient is in no apparent distress. Attention to examination is appropriate.  Neck:   Supple.  Full range of motion.  Respiratory: Patient is breathing without any difficulty.   NEUROLOGICAL:     Awake, alert, oriented to person, place, and time.  Speech is clear and fluent.   Cranial Nerves: Pupils equal round and reactive to light.  Facial tone is symmetric.  Facial sensation is symmetric. Shoulder shrug is symmetric. Tongue protrusion is midline.  There is no pronator drift.  Strength: Side Biceps Triceps Deltoid Interossei Grip Wrist Ext. Wrist Flex.  R 5 5 5 5 5 5 5   L 5 5 5 5 5 5  5  Side Iliopsoas Quads Hamstring PF DF EHL  R 5 5 5 5 5 5   L 5 5 5 5 5 5    Reflexes are ***2+ and symmetric at the biceps, triceps, brachioradialis, patella and achilles.   Hoffman's is absent.   Bilateral upper and lower extremity sensation is intact to light touch.    No evidence of dysmetria noted.  Gait is normal.     Medical Decision Making  Imaging: ***  I have personally reviewed the images and agree with the above interpretation.  Assessment and Plan: Mr. Mabe is a pleasant 72 y.o. male with ***    Thank you for involving me in the care of this patient.      Chester K. Myer Haff MD, [facility] Neurosurgery

## 2023-03-10 ENCOUNTER — Ambulatory Visit: Payer: Medicare Other | Admitting: Neurosurgery

## 2023-03-19 ENCOUNTER — Ambulatory Visit: Payer: Medicare Other | Admitting: Neurosurgery

## 2023-03-19 ENCOUNTER — Encounter: Payer: Self-pay | Admitting: Neurosurgery

## 2023-03-19 VITALS — BP 130/78 | Ht 65.0 in | Wt 172.0 lb

## 2023-03-19 DIAGNOSIS — G959 Disease of spinal cord, unspecified: Secondary | ICD-10-CM

## 2023-03-19 DIAGNOSIS — H9190 Unspecified hearing loss, unspecified ear: Secondary | ICD-10-CM | POA: Diagnosis not present

## 2023-03-19 DIAGNOSIS — R2689 Other abnormalities of gait and mobility: Secondary | ICD-10-CM | POA: Diagnosis not present

## 2023-03-19 DIAGNOSIS — M4714 Other spondylosis with myelopathy, thoracic region: Secondary | ICD-10-CM

## 2023-03-19 DIAGNOSIS — R29898 Other symptoms and signs involving the musculoskeletal system: Secondary | ICD-10-CM

## 2023-03-19 DIAGNOSIS — H9313 Tinnitus, bilateral: Secondary | ICD-10-CM

## 2023-03-19 NOTE — Progress Notes (Signed)
Referring Physician:  Lynnea Ferrier, MD [address] [facility] [place],  [place] [address]  Primary Physician:  Lynnea Ferrier, MD  History of Present Illness: 03/19/2023 [name] is here today with a chief complaint of low back pain with bilateral leg weakness with several falls recently.  He has issues where his legs gave out.  His left leg is worse than his right leg.  He also has numbness in his left leg worse than his right leg.  The numbness exists over his entire leg.  He has right arm numbness as well.  He has grip weakness and hand tingling.  He has started using a cane this year.  Last year, he did not use any assistance.  He is very hard of hearing and has had worsening hearing over the past several years.  He is also having tinnitus.    Bowel/Bladder Dysfunction: none  Conservative measures:  Physical therapy: has not participated in  Multimodal medical therapy including regular antiinflammatories: Prenisone, Methocarbamol, Hydrocodone Injections: has not received epidural steroid injections for his back  Past Surgery: no previous spinal surgeries   [name]. has some symptoms of cervical myelopathy.  The symptoms are causing a significant impact on the patient's life.   I have utilized the care everywhere function in epic to review the outside records available from external health systems.  Review of Systems:  A 10 point review of systems is negative, except for the pertinent positives and negatives detailed in the HPI.  Past Medical History: Past Medical History:  Diagnosis Date   Anxiety    Coronary artery disease    Depression    GERD (gastroesophageal reflux disease)    Gout    Hypertension    NSTEMI (non-ST elevated myocardial infarction) (HCC) 10/09/2022   Unstable angina (HCC) 11/11/2021    Past Surgical History: Past Surgical History:  Procedure Laterality Date   CARDIAC CATHETERIZATION     CARDIAC  SURGERY     COLONOSCOPY WITH PROPOFOL N/A 05/25/2017   Procedure: COLONOSCOPY WITH PROPOFOL;  Surgeon: Wyline Mood, MD;  Location: [facility] ENDOSCOPY;  Service: Gastroenterology;  Laterality: N/A;   CORONARY ARTERY BYPASS GRAFT     ESOPHAGOGASTRODUODENOSCOPY N/A 10/27/2017   Procedure: ESOPHAGOGASTRODUODENOSCOPY (EGD);  Surgeon: Toney Reil, MD;  Location: [facility] ENDOSCOPY;  Service: Gastroenterology;  Laterality: N/A;   ESOPHAGOGASTRODUODENOSCOPY (EGD) WITH PROPOFOL N/A 11/19/2017   Procedure: ESOPHAGOGASTRODUODENOSCOPY (EGD) WITH PROPOFOL;  Surgeon: Toney Reil, MD;  Location: [facility] ENDOSCOPY;  Service: Gastroenterology;  Laterality: N/A;   LEFT HEART CATH AND CORS/GRAFTS ANGIOGRAPHY N/A 10/14/2019   Procedure: LEFT HEART CATH AND CORS/GRAFTS ANGIOGRAPHY;  Surgeon: Lyn Records, MD;  Location: [facility] INVASIVE CV LAB;  Service: Cardiovascular;  Laterality: N/A;   LEFT HEART CATH AND CORS/GRAFTS ANGIOGRAPHY N/A 11/11/2021   Procedure: LEFT HEART CATH AND CORS/GRAFTS ANGIOGRAPHY;  Surgeon: Yvonne Kendall, MD;  Location: [facility] INVASIVE CV LAB;  Service: Cardiovascular;  Laterality: N/A;   LEFT HEART CATH AND CORS/GRAFTS ANGIOGRAPHY N/A 10/10/2022   Procedure: LEFT HEART CATH AND CORS/GRAFTS ANGIOGRAPHY;  Surgeon: Swaziland, Peter M, MD;  Location: [facility] INVASIVE CV LAB;  Service: Cardiovascular;  Laterality: N/A;    Allergies: Allergies as of 03/19/2023   (No Known Allergies)    Medications:  Current Outpatient Medications:    allopurinol (ZYLOPRIM) 100 MG tablet, Take 100 mg by mouth as needed (gout flare up)., Disp: , Rfl:    ALPRAZolam (XANAX) 0.5 MG  tablet, TAKE 1 TABLET BY MOUTH 3 TIMES A DAY AS NEEDED FOR ANXIETY., Disp: 90 tablet, Rfl: 2   aspirin EC 81 MG tablet, Take 1 tablet (81 mg total) by mouth daily. For heart attack prevention., Disp: , Rfl:    buPROPion (WELLBUTRIN XL) 300 MG 24 hr tablet, Take 1 tablet (300 mg total) by mouth daily., Disp: 90 tablet, Rfl: 0   colchicine 0.6 MG  tablet, Take 0.6 mg by mouth daily as needed (gout)., Disp: , Rfl:    HYDROcodone-acetaminophen (NORCO/VICODIN) 5-325 MG tablet, Take 1 tablet by mouth every 6 (six) hours as needed for moderate pain., Disp: , Rfl:    isosorbide mononitrate (IMDUR) 60 MG 24 hr tablet, Take 1 tablet (60 mg total) by mouth daily., Disp: 90 tablet, Rfl: 3   lisinopril (PRINIVIL,ZESTRIL) 10 MG tablet, Take 1 tablet (10 mg total) by mouth daily., Disp: 90 tablet, Rfl: 0   metoprolol succinate (TOPROL XL) 50 MG 24 hr tablet, Take 1 tablet (50 mg total) by mouth daily for 180 doses., Disp: 90 tablet, Rfl: 1   Multiple Vitamins-Minerals (MULTIVITAMIN WITH MINERALS) tablet, Take 1 tablet by mouth at bedtime., Disp: , Rfl:    nitroGLYCERIN (NITROSTAT) 0.4 MG SL tablet, Place 1 tablet (0.4 mg total) under the tongue every 5 (five) minutes as needed for chest pain., Disp: 25 tablet, Rfl: 3   omega-3 acid ethyl esters (LOVAZA) 1 g capsule, TAKE ONE CAPSULE BY MOUTH TWICE A DAY, Disp: 90 capsule, Rfl: 3   omeprazole (PRILOSEC) 20 MG capsule, Take 1 capsule (20 mg total) by mouth 2 (two) times daily before a meal. (Patient taking differently: Take 40 mg by mouth daily.), Disp: 180 capsule, Rfl: 0   predniSONE (DELTASONE) 10 MG tablet, Take by mouth as needed., Disp: , Rfl:    rosuvastatin (CRESTOR) 20 MG tablet, Take 1 tablet (20 mg total) by mouth at bedtime., Disp: 90 tablet, Rfl: 3   valACYclovir (VALTREX) 500 MG tablet, Take 500 mg by mouth 2 (two) times daily as needed (outbreak). , Disp: , Rfl:    zolpidem (AMBIEN) 10 MG tablet, Take 1 tablet (10 mg total) by mouth at bedtime., Disp: 30 tablet, Rfl: 2  Social History: Social History   Tobacco Use   Smoking status: Never   Smokeless tobacco: Never  Vaping Use   Vaping status: Never Used  Substance Use Topics   Alcohol use: Yes    Alcohol/week: 0.0 standard drinks of alcohol    Comment: 3 beer a month   Drug use: Yes    Frequency: 2.0 times per week    Types:  Cocaine    Comment: last use 15-20 years per patient    Family Medical History: Family History  Problem Relation Age of Onset   Heart attack Mother    Hyperlipidemia Mother    Hypertension Mother    Cancer Father        lung   Diabetes Sister    Diabetes Brother    Asthma Daughter    Cancer Maternal Grandmother    Stroke Maternal Grandfather    Heart attack Paternal Grandfather     Physical Examination: Vitals:   03/19/23 1112  BP: 130/78    General: Patient is in no apparent distress. Attention to examination is appropriate.  Neck:   Supple.  Full range of motion.  Respiratory: Patient is breathing without any difficulty.   NEUROLOGICAL:     Awake, alert, oriented to person, place, and time.  Speech  is clear and fluent.   Cranial Nerves: Pupils equal round and reactive to light.  Facial tone is symmetric.  Facial sensation is symmetric. Shoulder shrug is symmetric. Tongue protrusion is midline.  There is no pronator drift.  Strength: Side Biceps Triceps Deltoid Interossei Grip Wrist Ext. Wrist Flex.  R 5 4 5 5 4 5 5   L 5 4 5 5 4 5 5    Side Iliopsoas Quads Hamstring PF DF EHL  R 5 5 5 5 5 5   L 5 5 5 5 5 5    Reflexes are 1+ and symmetric at the biceps, triceps, brachioradialis, patella and achilles.   Hoffman's is absent.   Bilateral upper and lower extremity shows diminished right arm and left leg sensation in a nondermatomal pattern.  Mild evidence of dysmetria noted.  Gait is wide-based.  He cannot complete tandem gait.     Medical Decision Making  Imaging: MRI L spine 02/17/2023 Disc levels:   T12-L1:  Normal.   L1-L2:  Disc bulge results in mild spinal canal stenosis.   L2-L3: Right eccentric disc bulge and facet arthropathy results in compression of the traversing right L3 nerve root in the subarticular zone and moderate right neural foraminal narrowing.   L3-L4: Disc bulge and facet arthropathy results in mild spinal canal stenosis.    L4-L5: Left eccentric disc bulge and facet arthropathy results in compression of the traversing left L5 nerve root in the subarticular zone and moderate bilateral neural foraminal narrowing.   L5-S1: Right central disc protrusion with annular fissure results in compression of the traversing right S1 nerve root in the subarticular zone.   IMPRESSION: 1. Multilevel lumbar spondylosis, worst at L2-L3, L4-L5, and L5-S1, where there is compression of the traversing right L3, left L5, and right S1 nerve roots in the subarticular zones, respectively. 2. Moderate right neural foraminal narrowing at L2-L3 and moderate bilateral neural foraminal narrowing at L4-L5.     Electronically Signed   By: Orvan Falconer M.D.   On: 03/06/2023 15:42    I have personally reviewed the images and agree with the above interpretation.  Assessment and Plan: Mr. Weick is a pleasant 72 y.o. male with tinnitus and loss of hearing.  I will refer him to otolaryngology for workup.  He has issues with his balance as well as sensory changes, grip weakness, and triceps weakness that make me suspect that he may have cervical or thoracic myelopathy.  He has an imaging abnormality on his lumbar spine MRI in the lower part of his thoracic spine.  I do not think his current findings are consistent with his lumbar spine MRI scan.  I have recommended that we continue his workup with cervical and thoracic MRI to try to find the cause of his weakness and difficulty with his balance.  I spent a total of 30 minutes in this patient's care today. This time was spent reviewing pertinent records including imaging studies, obtaining and confirming history, performing a directed evaluation, formulating and discussing my recommendations, and documenting the visit within the medical record.     Thank you for involving me in the care of this patient.      Demetress Tift K. Myer Haff MD, [facility] Neurosurgery

## 2023-03-31 NOTE — Progress Notes (Deleted)
Cardiology Clinic Note   Patient Name: Billy Cisneros. Date of Encounter: 03/31/2023  Primary Care Provider:  Lynnea Ferrier, MD Primary Cardiologist:  Chrystie Nose, MD  Patient Profile    Billy Cisneros. 72 year old male presents the clinic today for follow-up evaluation of his coronary artery disease .  Past Medical History    Past Medical History:  Diagnosis Date   Anxiety    Coronary artery disease    Depression    GERD (gastroesophageal reflux disease)    Gout    Hypertension    NSTEMI (non-ST elevated myocardial infarction) (HCC) 10/09/2022   Unstable angina (HCC) 11/11/2021   Past Surgical History:  Procedure Laterality Date   CARDIAC CATHETERIZATION     CARDIAC SURGERY     COLONOSCOPY WITH PROPOFOL N/A 05/25/2017   Procedure: COLONOSCOPY WITH PROPOFOL;  Surgeon: Wyline Mood, MD;  Location: [facility] ENDOSCOPY;  Service: Gastroenterology;  Laterality: N/A;   CORONARY ARTERY BYPASS GRAFT     ESOPHAGOGASTRODUODENOSCOPY N/A 10/27/2017   Procedure: ESOPHAGOGASTRODUODENOSCOPY (EGD);  Surgeon: Toney Reil, MD;  Location: [facility] ENDOSCOPY;  Service: Gastroenterology;  Laterality: N/A;   ESOPHAGOGASTRODUODENOSCOPY (EGD) WITH PROPOFOL N/A 11/19/2017   Procedure: ESOPHAGOGASTRODUODENOSCOPY (EGD) WITH PROPOFOL;  Surgeon: Toney Reil, MD;  Location: [facility] ENDOSCOPY;  Service: Gastroenterology;  Laterality: N/A;   LEFT HEART CATH AND CORS/GRAFTS ANGIOGRAPHY N/A 10/14/2019   Procedure: LEFT HEART CATH AND CORS/GRAFTS ANGIOGRAPHY;  Surgeon: Lyn Records, MD;  Location: [facility] INVASIVE CV LAB;  Service: Cardiovascular;  Laterality: N/A;   LEFT HEART CATH AND CORS/GRAFTS ANGIOGRAPHY N/A 11/11/2021   Procedure: LEFT HEART CATH AND CORS/GRAFTS ANGIOGRAPHY;  Surgeon: Yvonne Kendall, MD;  Location: [facility] INVASIVE CV LAB;  Service: Cardiovascular;  Laterality: N/A;   LEFT HEART CATH AND CORS/GRAFTS ANGIOGRAPHY N/A 10/10/2022   Procedure: LEFT HEART CATH AND CORS/GRAFTS  ANGIOGRAPHY;  Surgeon: [place], Peter M, MD;  Location: [facility] INVASIVE CV LAB;  Service: Cardiovascular;  Laterality: N/A;    Allergies  No Known Allergies  History of Present Illness    Billy Cisneros. has a PMH of coronary artery disease status post CABG in 2000 (LIMA to LAD, RIMA to RCA, SVG to OM1, diagonal and ramus) underwent cardiac catheterization 10/2019 which showed patent LIMA-LAD, patent RIMA-PDA, total occluded distal left main, totally occluded ostial circumflex, totally occluded ostial LAD and normal LVEDP.  Medical management was recommended.  His PMH also includes HTN, anxiety, gout, and GERD.  He was seen in follow-up by Dr. Rennis Golden on 09/23/2019.  During that time he reported difficulty with chest discomfort.  He described exertional type chest pain with left arm heaviness.  His symptoms improved with rest.  It was noted that his LDL cholesterol was 62 but his triglycerides remained elevated at 320.  He underwent cardiac catheterization by Dr. Katrinka Blazing 6/21.  At that time his Imdur was uptitrated.  He presented to the clinic 10/30/20 for follow-up evaluation stated he had not noticed a difference in his chest discomfort with the addition of metoprolol.  He did not start his Imdur medication.  He reported that with increased physical activity he noticed chest discomfort.  He gave an example of taking out his trash.  When he would rest after increased activity his chest discomfort would ease off and go away.  We reviewed his angiography and he and his wife expressed understanding.  He reported that he generally was fairly sedentary.  He also reported that he did not eat  much fiber.  We reviewed his cholesterol panel and he reported that over the last several months he had not watched his diet as closely.  I had him start his Imdur, continue metoprolol, increase his physical activity, start fiber supplementation, and planned follow-up in 6 months.    He presented to the clinic 09/12/21 for  follow-up evaluation and stated he felt well.  He was unable to come to his last 2 appointments due to his daughter being in a car accident and then having flu.  We reviewed his current medication list and he is tolerating his medications well without side effects.  He did note some atypical chest discomfort at night when he laid down.  However, he denied exertional chest discomfort.  He had been walking on the treadmill daily and going to the [facility] 1 time per week.  He reported that he had been losing some weight.  I refilled his metoprolol, requested his updated lab work from his PCP and planned follow-up for 12 months.  He presented to the emergency department on 11/11/2021 with unstable angina.  He underwent left heart cath which showed severe coronary artery disease including subtotal chronic occlusion of the ostial LAD, proximal ramus, proximal circumflex and severe diffuse RCA disease.  He had a patent LIMA-LAD 30% stenosed, widely patent RIMA-RPDA, patent SVG-ramus, chronically occluded SVG-D.  Medical management was continued.  It was felt that running out of his isosorbide mononitrate brought on his recurrent angina.  He was discharged in stable condition on 11/12/2021.  He presented to the clinic 11/22/21 for follow-up evaluation stated that he had been off of his Imdur for about 5 days.  He did not have a new prescription of sublingual nitroglycerin.  He reported that he had nitro spray in the past but never used it and his prescription was old.  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  He occasionally lifted heavier materials in and out of his Billy Cisneros.  He did report a fall out of bed .  He reported he rolled the wrong way.  We reviewed the importance of heart healthy low-sodium diet.  We reviewed his angiography and he expressed understanding.  I  refilled his sublingual nitroglycerin, continued his current medication regimen, had him maintain his physical activity, gave the salty 6 diet sheet, and  planned follow-up in 4 to 6 months.  He was seen in follow-up by Dr. Bjorn Pippin on 10/11/2022 during admission.  He had been admitted with chest discomfort.Marland Kitchen  He denied chest pain.  His blood pressure was well-controlled.  Echocardiogram was reassuring.  Cardiac catheterization 10/10/2022 showed severe three-vessel obstructive CAD, patent LIMA, patent RIMA, and patent SVG to ramus intermedius with normal LVEDP.  Medical management was recommended.  He was continued on lisinopril, Imdur, and metoprolol.  He presents to the clinic today for follow-up evaluation and states***.  Today he denies chest pain, shortness of breath, lower extremity edema, fatigue, palpitations, melena, hematuria, hemoptysis, diaphoresis, weakness, presyncope, syncope, orthopnea, and PND.     Home Medications    Prior to Admission medications   Medication Sig Start Date End Date Taking? Authorizing Provider  ALPRAZolam (XANAX) 0.5 MG tablet TAKE 1 TABLET BY MOUTH 3 TIMES A DAY AS NEEDED FOR ANXIETY. Patient taking differently: Take 0.5 mg by mouth 3 (three) times daily as needed for anxiety. TAKE 1 TABLET BY MOUTH 3 TIMES A DAY AS NEEDED FOR ANXIETY. 06/24/17   Ellyn Hack, MD  aspirin EC 81 MG tablet  Take 1 tablet (81 mg total) by mouth daily. For heart attack prevention. 11/22/11   Tamala Julian, PA-C  buPROPion (WELLBUTRIN XL) 300 MG 24 hr tablet Take 1 tablet (300 mg total) by mouth daily. 06/24/17   Ellyn Hack, MD  HYDROcodone-acetaminophen (NORCO/VICODIN) 5-325 MG tablet Take 1 tablet by mouth every 6 (six) hours as needed for moderate pain.  12/29/17   [provider]  icosapent Ethyl (VASCEPA) 1 g capsule Take 2 capsules (2 g total) by mouth 2 (two) times daily. 09/23/19   Hilty, Lisette Abu, MD  isosorbide mononitrate (IMDUR) 30 MG 24 hr tablet Take 1 tablet (30 mg total) by mouth daily. 10/14/19 10/13/20  Lyn Records, MD  lisinopril (PRINIVIL,ZESTRIL) 10 MG tablet Take 1 tablet (10 mg total) by mouth  daily. 06/24/17   Ellyn Hack, MD  metoprolol succinate (TOPROL XL) 25 MG 24 hr tablet Take 1 tablet (25 mg total) by mouth daily. 10/14/19 10/13/20  Lyn Records, MD  Multiple Vitamins-Minerals (MULTIVITAMIN WITH MINERALS) tablet Take 1 tablet by mouth daily.    [provider]  omeprazole (PRILOSEC) 20 MG capsule Take 1 capsule (20 mg total) by mouth 2 (two) times daily before a meal. 06/24/17   Ellyn Hack, MD  simvastatin (ZOCOR) 40 MG tablet Take 1 tablet (40 mg total) by mouth at bedtime. 06/24/17   Ellyn Hack, MD  valACYclovir (VALTREX) 500 MG tablet Take 500 mg by mouth 2 (two) times daily as needed (outbreak).     [provider]  vitamin B-12 (CYANOCOBALAMIN) 500 MCG tablet Take 500 mcg by mouth daily.    [provider]  zolpidem (AMBIEN) 10 MG tablet Take 1 tablet (10 mg total) by mouth at bedtime. Patient taking differently: Take 10 mg by mouth at bedtime as needed for sleep.  06/24/17   Ellyn Hack, MD    Family History    Family History  Problem Relation Age of Onset   Heart attack Mother    Hyperlipidemia Mother    Hypertension Mother    Cancer Father        lung   Diabetes Sister    Diabetes Brother    Asthma Daughter    Cancer Maternal Grandmother    Stroke Maternal Grandfather    Heart attack Paternal Grandfather    He indicated that his mother is deceased. He indicated that his father is deceased. He indicated that his sister is alive. He indicated that his brother is alive. He indicated that his maternal grandmother is deceased. He indicated that his maternal grandfather is deceased. He indicated that his paternal grandmother is deceased. He indicated that his paternal grandfather is deceased. He indicated that his daughter is alive.   Social History    Social History   Socioeconomic History   Marital status: Married    Spouse name: Not on file   Number of children: Not on file   Years of education: Not on file    Highest education level: Not on file  Occupational History   Not on file  Tobacco Use   Smoking status: Never   Smokeless tobacco: Never  Vaping Use   Vaping status: Never Used  Substance and Sexual Activity   Alcohol use: Yes    Alcohol/week: 0.0 standard drinks of alcohol    Comment: 3 beer a month   Drug use: Yes    Frequency: 2.0 times per week  Types: Cocaine    Comment: last use 15-20 years per patient   Sexual activity: Yes    Birth control/protection: None  Other Topics Concern   Not on file  Social History Narrative   Not on file   Social Determinants of Health   Financial Resource Strain: Low Risk  (02/05/2023)   Received from [facility]   Overall Financial Resource Strain (CARDIA)    Difficulty of Paying Living Expenses: Not hard at all  Food Insecurity: No Food Insecurity (02/05/2023)   Received from [facility]   Hunger Vital Sign    Worried About Running Out of Food in the Last Year: Never true    Ran Out of Food in the Last Year: Never true  Transportation Needs: No Transportation Needs (02/05/2023)   Received from [facility] - Transportation    In the past 12 months, has lack of transportation kept you from medical appointments or from getting medications?: No    Lack of Transportation (Non-Medical): No  Physical Activity: Not on file  Stress: Not on file  Social Connections: Not on file  Intimate Partner Violence: Not on file     Review of Systems    General:  No chills, fever, night sweats or weight changes.  Cardiovascular:  No chest pain, dyspnea on exertion, edema, orthopnea, palpitations, paroxysmal nocturnal dyspnea. Dermatological: No rash, lesions/masses Respiratory: No cough, dyspnea Urologic: No hematuria, dysuria Abdominal:   No nausea, vomiting, diarrhea, bright red blood per rectum, melena, or hematemesis Neurologic:  No visual changes, wkns, changes in mental  status. All other systems reviewed and are otherwise negative except as noted above.  Physical Exam    VS:  There were no vitals taken for this visit. , BMI There is no height or weight on file to calculate BMI. GEN: Well nourished, well developed, in no acute distress. HEENT: normal. Neck: Supple, no JVD, carotid bruits, or masses. Cardiac: RRR, no murmurs, rubs, or gallops. No clubbing, cyanosis, edema.  Radials/DP/PT 2+ and equal bilaterally.  Respiratory:  Respirations regular and unlabored, clear to auscultation bilaterally. GI: Soft, nontender, nondistended, BS + x 4. MS: no deformity or atrophy. Skin: warm and dry, no rash. Neuro:  Strength and sensation are intact. Psych: Normal affect.  Accessory Clinical Findings    Recent Labs: 10/10/2022: Hemoglobin 13.4; Platelets 254 10/11/2022: BUN 16; Creatinine, Ser 1.43; Potassium 4.4; Sodium 135   Recent Lipid Panel    Component Value Date/Time   CHOL 130 10/10/2022 0941   CHOL 146 01/08/2015 0925   CHOL 141 08/06/2011 0327   TRIG 141 10/10/2022 0941   TRIG 375 (H) 08/06/2011 0327   HDL 43 10/10/2022 0941   HDL 42 01/08/2015 0925   HDL 25 (L) 08/06/2011 0327   CHOLHDL 3.0 10/10/2022 0941   VLDL 28 10/10/2022 0941   VLDL 75 (H) 08/06/2011 0327   LDLCALC 59 10/10/2022 0941   LDLCALC 76 01/08/2015 0925   LDLCALC 41 08/06/2011 0327    ECG personally reviewed by me today none today.  EKG 09/12/2021  normal sinus rhythm 80 bpm  Cardiac catheterization 10/14/2019 Patent saphenous vein graft to the ramus intermedius but occlusion occlusion of the saphenous vein graft limb to the obtuse marginal.   Patent LIMA to distal LAD Patent RIMA to PDA Totally occluded distal left main Totally occluded ostial circumflex Totally occluded ostial LAD Normal LV end-diastolic pressure.   RECOMMENDATIONS:   Toprol-XL 25 mg/day After 1  week if angina is still significant, add Imdur 30 mg/day. Further titrate anti-ischemic therapy until  relief of symptoms. No interventional targets.    Diagnostic Dominance: Right    Intervention  Echocardiogram 10/10/2022  IMPRESSIONS     1. Left ventricular ejection fraction, by estimation, is 55 to 60%. The  left ventricle has normal function. The left ventricle has no regional  wall motion abnormalities. Left ventricular diastolic parameters are  consistent with Grade I diastolic  dysfunction (impaired relaxation).   2. Right ventricular systolic function is normal. The right ventricular  size is normal. Tricuspid regurgitation signal is inadequate for assessing  PA pressure.   3. The mitral valve is normal in structure. Trivial mitral valve  regurgitation. No evidence of mitral stenosis.   4. The aortic valve is tricuspid. Aortic valve regurgitation is not  visualized. No aortic stenosis is present.   5. The inferior vena cava is normal in size with greater than 50%  respiratory variability, suggesting right atrial pressure of 3 mmHg.   FINDINGS   Left Ventricle: Left ventricular ejection fraction, by estimation, is 55  to 60%. The left ventricle has normal function. The left ventricle has no  regional wall motion abnormalities. The left ventricular internal cavity  size was normal in size. There is   no left ventricular hypertrophy. Left ventricular diastolic parameters  are consistent with Grade I diastolic dysfunction (impaired relaxation).  Indeterminate filling pressures.   Right Ventricle: The right ventricular size is normal. No increase in  right ventricular wall thickness. Right ventricular systolic function is  normal. Tricuspid regurgitation signal is inadequate for assessing PA  pressure.   Left Atrium: Left atrial size was normal in size.   Right Atrium: Right atrial size was normal in size.   Pericardium: Trivial pericardial effusion is present. Presence of  epicardial fat layer.   Mitral Valve: The mitral valve is normal in structure. Trivial  mitral  valve regurgitation. No evidence of mitral valve stenosis.   Tricuspid Valve: The tricuspid valve is normal in structure. Tricuspid  valve regurgitation is trivial. No evidence of tricuspid stenosis.   Aortic Valve: The aortic valve is tricuspid. Aortic valve regurgitation is  not visualized. No aortic stenosis is present.   Pulmonic Valve: The pulmonic valve was normal in structure. Pulmonic valve  regurgitation is not visualized. No evidence of pulmonic stenosis.   Aorta: The aortic root and ascending aorta are structurally normal, with  no evidence of dilitation.   Venous: The inferior vena cava is normal in size with greater than 50%  respiratory variability, suggesting right atrial pressure of 3 mmHg.   IAS/Shunts: The interatrial septum was not well visualized.    Cardiac catheterization 10/10/2022    Ost LM to Dist LM lesion is 50% stenosed.   Mid LAD lesion is 100% stenosed.   Ost LAD to Mid LAD lesion is 99% stenosed.   Ost Cx to Prox Cx lesion is 99% stenosed.   Origin lesion is 30% stenosed.   Origin to Insertion lesion between Ramus and 3rd Mrg  is 100% stenosed.   Origin to Insertion lesion is 100% stenosed.   3rd Mrg lesion is 100% stenosed.   LIMA and is small.   SVG and is normal in caliber.   SVG.   LV end diastolic pressure is normal.   Severe 3 vessel obstructive CAD Patent LIMA to the LAD Patent RIMA to the PDA Patent SVG to the ramus intermedius Normal LVEDP   Plan: no  change compared to prior cardiac cath in July 2023. Continue medical management.  Assessment & Plan   1.  Coronary artery disease-no chest pain today.  Admitted to the hospital 10/09/2022 and discharged on 10/11/2022.  Cardiac catheterization 10/10/2022 showed patent bypass grafts.  Medical management was continued.  I Continue current medical therapy Heart healthy low-sodium diet Increase physical activity as tolerated  Essential hypertension-BP today 132/78.  Well-controlled  at home. Continue metoprolol, Imdur, lisinopril Heart healthy low-sodium diet Increase physical activity as tolerated  Hyperlipidemia-LDL***84 on 05/21/2020 Continue Lovaza, Crestor High-fiber diet Follows with PCP   Disposition: Follow-up with Dr. Rennis Golden in 4-6 months.  Thomasene Ripple. Jennett Tarbell NP-C    03/31/2023, 10:04 AM [facility] [address] Office [phone] Fax [phone]  Notice: This dictation was prepared with Dragon dictation along with smaller phrase technology. Any transcriptional errors that result from this process are unintentional and may not be corrected upon review.  I spent 13*** minutes examining this patient, reviewing medications, and using patient centered shared decision making involving her cardiac care.  Prior to her visit I spent greater than 20 minutes reviewing her past medical history,  medications, and prior cardiac tests.

## 2023-04-02 ENCOUNTER — Ambulatory Visit: Payer: Medicare Other | Attending: General Practice | Admitting: General Practice

## 2023-04-03 ENCOUNTER — Encounter: Payer: Self-pay | Admitting: General Practice

## 2023-04-07 ENCOUNTER — Ambulatory Visit
Admission: RE | Admit: 2023-04-07 | Discharge: 2023-04-07 | Disposition: A | Discharge status: Home / Self Care | Payer: Medicare Other | Source: Ambulatory Visit | Attending: Neurosurgery | Admitting: Neurosurgery

## 2023-04-07 DIAGNOSIS — G959 Disease of spinal cord, unspecified: Secondary | ICD-10-CM

## 2023-04-07 DIAGNOSIS — M4714 Other spondylosis with myelopathy, thoracic region: Secondary | ICD-10-CM

## 2023-04-23 ENCOUNTER — Encounter: Payer: Self-pay | Admitting: Neurosurgery

## 2023-04-24 ENCOUNTER — Telehealth: Payer: Self-pay | Admitting: Neurosurgery

## 2023-04-24 NOTE — Telephone Encounter (Signed)
Patient's wife, Nettie Elm is calling to find out the results of his MRI and what the next steps are. Please advise.

## 2023-05-12 ENCOUNTER — Encounter: Payer: Self-pay | Admitting: Neurosurgery

## 2023-05-12 ENCOUNTER — Ambulatory Visit: Payer: Medicare Other | Admitting: Neurosurgery

## 2023-05-12 VITALS — BP 132/88 | Ht 65.0 in | Wt 172.0 lb

## 2023-05-12 DIAGNOSIS — R296 Repeated falls: Secondary | ICD-10-CM | POA: Diagnosis not present

## 2023-05-12 DIAGNOSIS — G959 Disease of spinal cord, unspecified: Secondary | ICD-10-CM | POA: Diagnosis not present

## 2023-05-12 DIAGNOSIS — R2689 Other abnormalities of gait and mobility: Secondary | ICD-10-CM

## 2023-05-12 DIAGNOSIS — H9190 Unspecified hearing loss, unspecified ear: Secondary | ICD-10-CM

## 2023-05-12 NOTE — Progress Notes (Signed)
 Referring Physician:  [name], MD [address] [facility] [place],  [place] [address]  Primary Physician:  [name], MD  History of Present Illness: 05/12/2023 Mr. Purdum returns to see me.  He has had continued issues with falls.   03/19/2023 [name] is here today with a chief complaint of low back pain with bilateral leg weakness with several falls recently.  He has issues where his legs gave out.  His left leg is worse than his right leg.  He also has numbness in his left leg worse than his right leg.  The numbness exists over his entire leg.  He has right arm numbness as well.  He has grip weakness and hand tingling.  He has started using a cane this year.  Last year, he did not use any assistance.  He is very hard of hearing and has had worsening hearing over the past several years.  He is also having tinnitus.    Bowel/Bladder Dysfunction: none  Conservative measures:  Physical therapy: has not participated in  Multimodal medical therapy including regular antiinflammatories: Prenisone, Methocarbamol , Hydrocodone  Injections: has not received epidural steroid injections for his back  Past Surgery: no previous spinal surgeries   Vinie VEAR Lyme Mickey. has some symptoms of cervical myelopathy.  The symptoms are causing a significant impact on the patient's life.   I have utilized the care everywhere function in epic to review the outside records available from external health systems.  Review of Systems:  A 10 point review of systems is negative, except for the pertinent positives and negatives detailed in the HPI.  Past Medical History: Past Medical History:  Diagnosis Date   Anxiety    Coronary artery disease    Depression    GERD (gastroesophageal reflux disease)    Gout    Hypertension    NSTEMI (non-ST elevated myocardial infarction) (HCC) 10/09/2022   Unstable angina (HCC) 11/11/2021    Past Surgical  History: Past Surgical History:  Procedure Laterality Date   CARDIAC CATHETERIZATION     CARDIAC SURGERY     COLONOSCOPY WITH PROPOFOL  N/A 05/25/2017   Procedure: COLONOSCOPY WITH PROPOFOL ;  Surgeon: Therisa Bi, MD;  Location: [facility] ENDOSCOPY;  Service: Gastroenterology;  Laterality: N/A;   CORONARY ARTERY BYPASS GRAFT     ESOPHAGOGASTRODUODENOSCOPY N/A 10/27/2017   Procedure: ESOPHAGOGASTRODUODENOSCOPY (EGD);  Surgeon: Unk Corinn Skiff, MD;  Location: [facility] ENDOSCOPY;  Service: Gastroenterology;  Laterality: N/A;   ESOPHAGOGASTRODUODENOSCOPY (EGD) WITH PROPOFOL  N/A 11/19/2017   Procedure: ESOPHAGOGASTRODUODENOSCOPY (EGD) WITH PROPOFOL ;  Surgeon: Unk Corinn Skiff, MD;  Location: [facility] ENDOSCOPY;  Service: Gastroenterology;  Laterality: N/A;   LEFT HEART CATH AND CORS/GRAFTS ANGIOGRAPHY N/A 10/14/2019   Procedure: LEFT HEART CATH AND CORS/GRAFTS ANGIOGRAPHY;  Surgeon: Claudene Victory ORN, MD;  Location: [facility] INVASIVE CV LAB;  Service: Cardiovascular;  Laterality: N/A;   LEFT HEART CATH AND CORS/GRAFTS ANGIOGRAPHY N/A 11/11/2021   Procedure: LEFT HEART CATH AND CORS/GRAFTS ANGIOGRAPHY;  Surgeon: Mady Bruckner, MD;  Location: [facility] INVASIVE CV LAB;  Service: Cardiovascular;  Laterality: N/A;   LEFT HEART CATH AND CORS/GRAFTS ANGIOGRAPHY N/A 10/10/2022   Procedure: LEFT HEART CATH AND CORS/GRAFTS ANGIOGRAPHY;  Surgeon: Jordan, Peter M, MD;  Location: [facility] INVASIVE CV LAB;  Service: Cardiovascular;  Laterality: N/A;    Allergies: Allergies as of 05/12/2023   (No Known Allergies)    Medications:  Current Outpatient Medications:    allopurinol  (ZYLOPRIM ) 100 MG tablet, Take 100 mg  by mouth as needed (gout flare up)., Disp: , Rfl:    ALPRAZolam  (XANAX ) 0.5 MG tablet, TAKE 1 TABLET BY MOUTH 3 TIMES A DAY AS NEEDED FOR ANXIETY., Disp: 90 tablet, Rfl: 2   aspirin  EC 81 MG tablet, Take 1 tablet (81 mg total) by mouth daily. For heart attack prevention., Disp: , Rfl:    buPROPion  (WELLBUTRIN  XL) 300 MG 24 hr  tablet, Take 1 tablet (300 mg total) by mouth daily., Disp: 90 tablet, Rfl: 0   colchicine  0.6 MG tablet, Take 0.6 mg by mouth daily as needed (gout)., Disp: , Rfl:    isosorbide  mononitrate (IMDUR ) 60 MG 24 hr tablet, Take 1 tablet (60 mg total) by mouth daily., Disp: 90 tablet, Rfl: 3   lisinopril  (PRINIVIL ,ZESTRIL ) 10 MG tablet, Take 1 tablet (10 mg total) by mouth daily., Disp: 90 tablet, Rfl: 0   Multiple Vitamins-Minerals (MULTIVITAMIN WITH MINERALS) tablet, Take 1 tablet by mouth at bedtime., Disp: , Rfl:    nitroGLYCERIN  (NITROSTAT ) 0.4 MG SL tablet, Place 1 tablet (0.4 mg total) under the tongue every 5 (five) minutes as needed for chest pain., Disp: 25 tablet, Rfl: 3   omega-3 acid ethyl esters (LOVAZA ) 1 g capsule, TAKE ONE CAPSULE BY MOUTH TWICE A DAY, Disp: 90 capsule, Rfl: 3   omeprazole  (PRILOSEC) 20 MG capsule, Take 1 capsule (20 mg total) by mouth 2 (two) times daily before a meal. (Patient taking differently: Take 40 mg by mouth daily.), Disp: 180 capsule, Rfl: 0   predniSONE  (DELTASONE ) 10 MG tablet, Take by mouth as needed., Disp: , Rfl:    rosuvastatin  (CRESTOR ) 20 MG tablet, Take 1 tablet (20 mg total) by mouth at bedtime., Disp: 90 tablet, Rfl: 3   valACYclovir  (VALTREX ) 500 MG tablet, Take 500 mg by mouth 2 (two) times daily as needed (outbreak). , Disp: , Rfl:    zolpidem  (AMBIEN ) 10 MG tablet, Take 1 tablet (10 mg total) by mouth at bedtime., Disp: 30 tablet, Rfl: 2   metoprolol  succinate (TOPROL  XL) 50 MG 24 hr tablet, Take 1 tablet (50 mg total) by mouth daily for 180 doses., Disp: 90 tablet, Rfl: 1  Social History: Social History   Tobacco Use   Smoking status: Never   Smokeless tobacco: Never  Vaping Use   Vaping status: Never Used  Substance Use Topics   Alcohol  use: Yes    Alcohol /week: 0.0 standard drinks of alcohol     Comment: 3 beer a month   Drug use: Yes    Frequency: 2.0 times per week    Types: Cocaine    Comment: last use 15-20 years per patient     Family Medical History: Family History  Problem Relation Age of Onset   Heart attack Mother    Hyperlipidemia Mother    Hypertension Mother    Cancer Father        lung   Diabetes Sister    Diabetes Brother    Asthma Daughter    Cancer Maternal Grandmother    Stroke Maternal Grandfather    Heart attack Paternal Grandfather     Physical Examination: Vitals:   05/12/23 1457  BP: 132/88    General: Patient is in no apparent distress. Attention to examination is appropriate.  Neck:   Supple.  Full range of motion.  Respiratory: Patient is breathing without any difficulty.   NEUROLOGICAL:     Awake, alert, oriented to person, place, and time.  Speech is clear and fluent.   Cranial Nerves:  Pupils equal round and reactive to light.  Facial tone is symmetric.  Facial sensation is symmetric. Shoulder shrug is symmetric. Tongue protrusion is midline.  There is no pronator drift.  Strength: Side Biceps Triceps Deltoid Interossei Grip Wrist Ext. Wrist Flex.  R 5 4+ 5 5 4 5 5   L 5 4+ 5 5 4 5 5    Side Iliopsoas Quads Hamstring PF DF EHL  R 5 5 5 5 5 5   L 5 5 5 5 5 5    Reflexes are 1+ and symmetric at the biceps, triceps, brachioradialis, patella and achilles.   Hoffman's is absent.   Bilateral upper and lower extremity shows diminished right arm and left leg sensation in a nondermatomal pattern.  Mild evidence of dysmetria noted.  Gait is wide-based.  He cannot complete tandem gait.     Medical Decision Making  Imaging: MRI L spine 02/17/2023 Disc levels:   T12-L1:  Normal.   L1-L2:  Disc bulge results in mild spinal canal stenosis.   L2-L3: Right eccentric disc bulge and facet arthropathy results in compression of the traversing right L3 nerve root in the subarticular zone and moderate right neural foraminal narrowing.   L3-L4: Disc bulge and facet arthropathy results in mild spinal canal stenosis.   L4-L5: Left eccentric disc bulge and facet arthropathy  results in compression of the traversing left L5 nerve root in the subarticular zone and moderate bilateral neural foraminal narrowing.   L5-S1: Right central disc protrusion with annular fissure results in compression of the traversing right S1 nerve root in the subarticular zone.   IMPRESSION: 1. Multilevel lumbar spondylosis, worst at L2-L3, L4-L5, and L5-S1, where there is compression of the traversing right L3, left L5, and right S1 nerve roots in the subarticular zones, respectively. 2. Moderate right neural foraminal narrowing at L2-L3 and moderate bilateral neural foraminal narrowing at L4-L5.     Electronically Signed   By: Ryan Chess M.D.   On: 03/06/2023 15:42   MRI CT spine 04/07/23 IMPRESSION: 1. Multilevel cervical spondylosis appears similar to the 2019 exam. The ventral thecal sac is effaced at C3-4, C4-5 and C6-7. There is deformity of the ventral cord at C4-5 and C5-6. 2. Severe left foraminal narrowing C3-4, moderate to moderately severe bilateral foraminal narrowing at C4-5 and moderately severe right foraminal narrowing at C6-7.     Electronically Signed   By: Debby Prader M.D.   On: 04/23/2023 09:48  IMPRESSION: No evidence of high-grade spinal canal or neural foraminal stenosis. No cord signal abnormality.     Electronically Signed   By: Lyndall Gore M.D.   On: 04/22/2023 14:57  I have personally reviewed the images and agree with the above interpretation.  Assessment and Plan: Billy Cisneros is a pleasant 73 y.o. male with cervical myelopathy.    We discussed the laminoplasty.  At this point, he would prefer to try nonoperative means.  I will refer him for physical therapy and see him back in 2 months.  If he is not improved at that time, we will discuss laminoplasty.  I spent a total of 10 minutes in this patient's care today. This time was spent reviewing pertinent records including imaging studies, obtaining and confirming history,  performing a directed evaluation, formulating and discussing my recommendations, and documenting the visit within the medical record.     Thank you for involving me in the care of this patient.      Vansh Reckart K. Clois MD, [facility] Neurosurgery

## 2023-05-15 ENCOUNTER — Other Ambulatory Visit: Payer: Self-pay

## 2023-05-15 MED ORDER — NITROGLYCERIN 0.4 MG SL SUBL: 0.4000 mg | SUBLINGUAL_TABLET | SUBLINGUAL | 4 refills | Status: DC | PRN

## 2023-05-19 NOTE — Progress Notes (Deleted)
 Cardiology Clinic Note   Patient Name: Billy Cisneros. Date of Encounter: 05/19/2023  Primary Care Provider:  [name], MD Primary Cardiologist:  Billy JAYSON Maxcy, MD  Patient Profile    Billy Cisneros. 73 year old male presents the clinic today for follow-up evaluation of his coronary artery disease .  Past Medical History    Past Medical History:  Diagnosis Date   Anxiety    Coronary artery disease    Depression    GERD (gastroesophageal reflux disease)    Gout    Hypertension    NSTEMI (non-ST elevated myocardial infarction) (HCC) 10/09/2022   Unstable angina (HCC) 11/11/2021   Past Surgical History:  Procedure Laterality Date   CARDIAC CATHETERIZATION     CARDIAC SURGERY     COLONOSCOPY WITH PROPOFOL  N/A 05/25/2017   Procedure: COLONOSCOPY WITH PROPOFOL ;  Surgeon: Therisa Bi, MD;  Location: [facility] ENDOSCOPY;  Service: Gastroenterology;  Laterality: N/A;   CORONARY ARTERY BYPASS GRAFT     ESOPHAGOGASTRODUODENOSCOPY N/A 10/27/2017   Procedure: ESOPHAGOGASTRODUODENOSCOPY (EGD);  Surgeon: Unk Corinn Skiff, MD;  Location: [facility] ENDOSCOPY;  Service: Gastroenterology;  Laterality: N/A;   ESOPHAGOGASTRODUODENOSCOPY (EGD) WITH PROPOFOL  N/A 11/19/2017   Procedure: ESOPHAGOGASTRODUODENOSCOPY (EGD) WITH PROPOFOL ;  Surgeon: Unk Corinn Skiff, MD;  Location: [facility] ENDOSCOPY;  Service: Gastroenterology;  Laterality: N/A;   LEFT HEART CATH AND CORS/GRAFTS ANGIOGRAPHY N/A 10/14/2019   Procedure: LEFT HEART CATH AND CORS/GRAFTS ANGIOGRAPHY;  Surgeon: Claudene Victory ORN, MD;  Location: [facility] INVASIVE CV LAB;  Service: Cardiovascular;  Laterality: N/A;   LEFT HEART CATH AND CORS/GRAFTS ANGIOGRAPHY N/A 11/11/2021   Procedure: LEFT HEART CATH AND CORS/GRAFTS ANGIOGRAPHY;  Surgeon: Mady Bruckner, MD;  Location: [facility] INVASIVE CV LAB;  Service: Cardiovascular;  Laterality: N/A;   LEFT HEART CATH AND CORS/GRAFTS ANGIOGRAPHY N/A 10/10/2022   Procedure: LEFT HEART CATH AND CORS/GRAFTS  ANGIOGRAPHY;  Surgeon: Jordan, Peter M, MD;  Location: [facility] INVASIVE CV LAB;  Service: Cardiovascular;  Laterality: N/A;    Allergies  No Known Allergies  History of Present Illness    Billy Cisneros. has a PMH of coronary artery disease status post CABG in 2000 (LIMA to LAD, RIMA to RCA, SVG to OM1, diagonal and ramus) underwent cardiac catheterization 10/2019 which showed patent LIMA-LAD, patent RIMA-PDA, total occluded distal left main, totally occluded ostial circumflex, totally occluded ostial LAD and normal LVEDP.  Medical management was recommended.  His PMH also includes HTN, anxiety, gout, and GERD.  He was seen in follow-up by Dr. Maxcy on 09/23/2019.  During that time he reported difficulty with chest discomfort.  He described exertional type chest pain with left arm heaviness.  His symptoms improved with rest.  It was noted that his LDL cholesterol was 62 but his triglycerides remained elevated at 320.  He underwent cardiac catheterization by Dr. Claudene 6/21.  At that time his Imdur  was uptitrated.  He presented to the clinic 10/30/20 for follow-up evaluation stated he had not noticed a difference in his chest discomfort with the addition of metoprolol .  He did not start his Imdur  medication.  He reported that with increased physical activity he noticed chest discomfort.  He gave an example of taking out his trash.  When he would rest after increased activity his chest discomfort would ease off and go away.  We reviewed his angiography and he and his wife expressed understanding.  He reported that he generally was fairly sedentary.  He also reported that he did not eat  much fiber.  We reviewed his cholesterol panel and he reported that over the last several months he had not watched his diet as closely.  I had him start his Imdur , continue metoprolol , increase his physical activity, start fiber supplementation, and planned follow-up in 6 months.    He presented to the clinic 09/12/21 for  follow-up evaluation and stated he felt well.  He was unable to come to his last 2 appointments due to his daughter being in a car accident and then having flu.  We reviewed his current medication list and he is tolerating his medications well without side effects.  He did note some atypical chest discomfort at night when he laid down.  However, he denied exertional chest discomfort.  He had been walking on the treadmill daily and going to the [facility] 1 time per week.  He reported that he had been losing some weight.  I refilled his metoprolol , requested his updated lab work from his PCP and planned follow-up for 12 months.  He presented to the emergency department on 11/11/2021 with unstable angina.  He underwent left heart cath which showed severe coronary artery disease including subtotal chronic occlusion of the ostial LAD, proximal ramus, proximal circumflex and severe diffuse RCA disease.  He had a patent LIMA-LAD 30% stenosed, widely patent RIMA-RPDA, patent SVG-ramus, chronically occluded SVG-D.  Medical management was continued.  It was felt that running out of his isosorbide  mononitrate brought on his recurrent angina.  He was discharged in stable condition on 11/12/2021.  He presents to the clinic 11/22/21 for follow-up evaluation stated that he had been off of his Imdur  for about 5 days.  He did not have a new prescription of sublingual nitroglycerin .  He reported that he had nitro spray in the past but never used it and his prescription was old.  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  He occasionally lifted heavier materials in and out of his fleeta.  He did report a fall out of bed 11/21/21.  He reported he rolled the wrong way.  We reviewed the importance of heart healthy low-sodium diet.  We reviewed his angiography and he expressed understanding.  I refilled his sublingual nitroglycerin , continued his current medication regimen, and plan follow-up in 4 to 6 months.  He was admitted to the  hospital on 10/09/2022 and discharged on 10/11/2022.  He was noted to have NSTEMI.  His cardiac troponins trended up from 6-153, 441, and 461.  His EKG showed no acute ST changes.  He underwent cardiac catheterization on 10/10/2022.  He was not noted to have change from prior cardiac catheterization 7/23.  Medical management was continued.  His echocardiogram 10/10/2022 showed an EF of 55-60%, G1 DD, trivial mitral valve regurgitation.  He had presented with pain in his neck to [facility].  CT spine showed no acute fracture but did note worsening moderate spinal stenosis along C4-C5 and severe bilateral neuroforaminal stenosis at C3-C4.  Recommendation to follow-up with neurosurgery was recommended.  He presents to the clinic today for follow-up evaluation and states***.  Today he denies chest pain, shortness of breath, lower extremity edema, fatigue, palpitations, melena, hematuria, hemoptysis, diaphoresis, weakness, presyncope, syncope, orthopnea, and PND.  Coronary artery disease-no chest pain today.  Underwent cardiac catheterization 10/10/2022 in the setting of NSTEMI with elevated troponins.  No changes were noted from his previous 7/23 cardiac catheterization and medical management was recommended.  Cardiac catheterization 11/11/21 showed severe coronary artery disease including subtotal chronic  occlusion of the ostial LAD, proximal ramus, proximal circumflex and severe diffuse RCA disease.  He had a patent LIMA-LAD 30% stenosed, widely patent RIMA-RPDA, patent SVG-ramus, chronically occluded SVG-D.  Continue current medical therapy Heart healthy low-sodium diet Increase physical activity as tolerated Order CBC, BMP  Essential hypertension-BP today 132***/78.   Maintain blood pressure log. Continue metoprolol , Imdur , lisinopril  Heart healthy low-sodium diet Increase physical activity as tolerated  Hyperlipidemia-LDL***84 on 05/21/2020 Continue Lovaza , Crestor , omega-3 fatty acids Heart healthy  low-sodium high-fiber diet Increase physical activity as tolerated Follows with PCP-request labs***  Spinal stenosis-during admission 5/24 - 6/24.  He reported neck discomfort.  Spine CT showed moderate spinal stenosis along C4-C5 and severe bilateral neuroforaminal stenosis at C3-C4. Following with neurosurgery  Disposition: Follow-up with Dr. Mona in 6-9 months.   Home Medications    Prior to Admission medications   Medication Sig Start Date End Date Taking? Authorizing Provider  ALPRAZolam  (XANAX ) 0.5 MG tablet TAKE 1 TABLET BY MOUTH 3 TIMES A DAY AS NEEDED FOR ANXIETY. Patient taking differently: Take 0.5 mg by mouth 3 (three) times daily as needed for anxiety. TAKE 1 TABLET BY MOUTH 3 TIMES A DAY AS NEEDED FOR ANXIETY. 06/24/17   Maree Leni Edyth DELENA, MD  aspirin  EC 81 MG tablet Take 1 tablet (81 mg total) by mouth daily. For heart attack prevention. 11/22/11   Rochel Aureliano DASEN, PA-C  buPROPion  (WELLBUTRIN  XL) 300 MG 24 hr tablet Take 1 tablet (300 mg total) by mouth daily. 06/24/17   Maree Leni Edyth DELENA, MD  HYDROcodone -acetaminophen  (NORCO/VICODIN) 5-325 MG tablet Take 1 tablet by mouth every 6 (six) hours as needed for moderate pain.  12/29/17   [provider]  icosapent  Ethyl (VASCEPA ) 1 g capsule Take 2 capsules (2 g total) by mouth 2 (two) times daily. 09/23/19   Hilty, Billy BROCKS, MD  isosorbide  mononitrate (IMDUR ) 30 MG 24 hr tablet Take 1 tablet (30 mg total) by mouth daily. 10/14/19 10/13/20  Claudene Victory ORN, MD  lisinopril  (PRINIVIL ,ZESTRIL ) 10 MG tablet Take 1 tablet (10 mg total) by mouth daily. 06/24/17   Maree Leni Edyth DELENA, MD  metoprolol  succinate (TOPROL  XL) 25 MG 24 hr tablet Take 1 tablet (25 mg total) by mouth daily. 10/14/19 10/13/20  Claudene Victory ORN, MD  Multiple Vitamins-Minerals (MULTIVITAMIN WITH MINERALS) tablet Take 1 tablet by mouth daily.    [provider]  omeprazole  (PRILOSEC) 20 MG capsule Take 1 capsule (20 mg total) by mouth 2 (two) times daily before a  meal. 06/24/17   Maree Leni Edyth DELENA, MD  simvastatin  (ZOCOR ) 40 MG tablet Take 1 tablet (40 mg total) by mouth at bedtime. 06/24/17   Maree Leni Edyth DELENA, MD  valACYclovir  (VALTREX ) 500 MG tablet Take 500 mg by mouth 2 (two) times daily as needed (outbreak).     [provider]  vitamin B-12 (CYANOCOBALAMIN ) 500 MCG tablet Take 500 mcg by mouth daily.    [provider]  zolpidem  (AMBIEN ) 10 MG tablet Take 1 tablet (10 mg total) by mouth at bedtime. Patient taking differently: Take 10 mg by mouth at bedtime as needed for sleep.  06/24/17   Maree Leni Edyth DELENA, MD    Family History    Family History  Problem Relation Age of Onset   Heart attack Mother    Hyperlipidemia Mother    Hypertension Mother    Cancer Father        lung   Diabetes Sister  Diabetes Brother    Asthma Daughter    Cancer Maternal Grandmother    Stroke Maternal Grandfather    Heart attack Paternal Grandfather    He indicated that his mother is deceased. He indicated that his father is deceased. He indicated that his sister is alive. He indicated that his brother is alive. He indicated that his maternal grandmother is deceased. He indicated that his maternal grandfather is deceased. He indicated that his paternal grandmother is deceased. He indicated that his paternal grandfather is deceased. He indicated that his daughter is alive.   Social History    Social History   Socioeconomic History   Marital status: Married    Spouse name: Not on file   Number of children: Not on file   Years of education: Not on file   Highest education level: Not on file  Occupational History   Not on file  Tobacco Use   Smoking status: Never   Smokeless tobacco: Never  Vaping Use   Vaping status: Never Used  Substance and Sexual Activity   Alcohol  use: Yes    Alcohol /week: 0.0 standard drinks of alcohol     Comment: 3 beer a month   Drug use: Yes    Frequency: 2.0 times per week    Types: Cocaine    Comment:  last use 15-20 years per patient   Sexual activity: Yes    Birth control/protection: None  Other Topics Concern   Not on file  Social History Narrative   Not on file   Social Drivers of Health   Financial Resource Strain: Low Risk  (02/05/2023)   Received from [facility]   Overall Financial Resource Strain (CARDIA)    Difficulty of Paying Living Expenses: Not hard at all  Food Insecurity: No Food Insecurity (02/05/2023)   Received from [facility]   Hunger Vital Sign    Worried About Running Out of Food in the Last Year: Never true    Ran Out of Food in the Last Year: Never true  Transportation Needs: No Transportation Needs (02/05/2023)   Received from [facility] - Transportation    In the past 12 months, has lack of transportation kept you from medical appointments or from getting medications?: No    Lack of Transportation (Non-Medical): No  Physical Activity: Not on file  Stress: Not on file  Social Connections: Not on file  Intimate Partner Violence: Not on file     Review of Systems    General:  No chills, fever, night sweats or weight changes.  Cardiovascular:  No chest pain, dyspnea on exertion, edema, orthopnea, palpitations, paroxysmal nocturnal dyspnea. Dermatological: No rash, lesions/masses Respiratory: No cough, dyspnea Urologic: No hematuria, dysuria Abdominal:   No nausea, vomiting, diarrhea, bright red blood per rectum, melena, or hematemesis Neurologic:  No visual changes, wkns, changes in mental status. All other systems reviewed and are otherwise negative except as noted above.  Physical Exam    VS:  There were no vitals taken for this visit. , BMI There is no height or weight on file to calculate BMI. GEN: Well nourished, well developed, in no acute distress. HEENT: normal. Neck: Supple, no JVD, carotid bruits, or masses. Cardiac: RRR, no murmurs, rubs, or gallops. No clubbing, cyanosis,  edema.  Radials/DP/PT 2+ and equal bilaterally.  Respiratory:  Respirations regular and unlabored, clear to auscultation bilaterally. GI: Soft, nontender, nondistended, BS + x 4. MS: no deformity or atrophy. Skin:  warm and dry, no rash. Neuro:  Strength and sensation are intact. Psych: Normal affect.  Accessory Clinical Findings    Recent Labs: 10/10/2022: Hemoglobin 13.4; Platelets 254 10/11/2022: BUN 16; Creatinine, Ser 1.43; Potassium 4.4; Sodium 135   Recent Lipid Panel    Component Value Date/Time   CHOL 130 10/10/2022 0941   CHOL 146 01/08/2015 0925   CHOL 141 08/06/2011 0327   TRIG 141 10/10/2022 0941   TRIG 375 (H) 08/06/2011 0327   HDL 43 10/10/2022 0941   HDL 42 01/08/2015 0925   HDL 25 (L) 08/06/2011 0327   CHOLHDL 3.0 10/10/2022 0941   VLDL 28 10/10/2022 0941   VLDL 75 (H) 08/06/2011 0327   LDLCALC 59 10/10/2022 0941   LDLCALC 76 01/08/2015 0925   LDLCALC 41 08/06/2011 0327    ECG personally reviewed by me today***  EKG 09/12/2021  normal sinus rhythm 80 bpm  Cardiac catheterization 10/14/2019 Patent saphenous vein graft to the ramus intermedius but occlusion occlusion of the saphenous vein graft limb to the obtuse marginal.   Patent LIMA to distal LAD Patent RIMA to PDA Totally occluded distal left main Totally occluded ostial circumflex Totally occluded ostial LAD Normal LV end-diastolic pressure.   RECOMMENDATIONS:   Toprol -XL 25 mg/day After 1 week if angina is still significant, add Imdur  30 mg/day. Further titrate anti-ischemic therapy until relief of symptoms. No interventional targets.    Diagnostic Dominance: Right    Intervention   Cardiac Catheterization: 10/10/2022    Ost LM to Dist LM lesion is 50% stenosed.   Mid LAD lesion is 100% stenosed.   Ost LAD to Mid LAD lesion is 99% stenosed.   Ost Cx to Prox Cx lesion is 99% stenosed.   Origin lesion is 30% stenosed.   Origin to Insertion lesion between Ramus and 3rd Mrg  is 100%  stenosed.   Origin to Insertion lesion is 100% stenosed.   3rd Mrg lesion is 100% stenosed.   LIMA and is small.   SVG and is normal in caliber.   SVG.   LV end diastolic pressure is normal.   Severe 3 vessel obstructive CAD Patent LIMA to the LAD Patent RIMA to the PDA Patent SVG to the ramus intermedius Normal LVEDP   Plan: no change compared to prior cardiac cath in July 2023. Continue medical management.  Assessment & Plan   1.  ***  Josefa HERO. Iliyah Bui NP-C    05/19/2023, 1:56 PM [facility] [address] Office [phone] Fax [phone]    I spent 13***  minutes examining this patient, reviewing medications, and using patient centered shared decision making involving their cardiac care.  Prior to their visit I spent greater than 20 minutes reviewing  past medical history,  medications, and prior cardiac tests.

## 2023-05-22 ENCOUNTER — Ambulatory Visit: Payer: Medicare Other | Admitting: General Practice

## 2023-05-29 NOTE — Progress Notes (Unsigned)
Cardiology Clinic Note   Patient Name: Billy Cisneros. Date of Encounter: 06/02/2023  Primary Care Provider:  Lynnea Ferrier, MD Primary Cardiologist:  Chrystie Nose, MD  Patient Profile    Billy Cisneros. 73 year old male presents the clinic today for follow-up evaluation of his coronary artery disease .  Past Medical History    Past Medical History:  Diagnosis Date   Anxiety    Coronary artery disease    Depression    GERD (gastroesophageal reflux disease)    Gout    Hypertension    NSTEMI (non-ST elevated myocardial infarction) (HCC) 10/09/2022   Unstable angina (HCC) 11/11/2021   Past Surgical History:  Procedure Laterality Date   CARDIAC CATHETERIZATION     CARDIAC SURGERY     COLONOSCOPY WITH PROPOFOL N/A 05/25/2017   Procedure: COLONOSCOPY WITH PROPOFOL;  Surgeon: Wyline Mood, MD;  Location: [facility] ENDOSCOPY;  Service: Gastroenterology;  Laterality: N/A;   CORONARY ARTERY BYPASS GRAFT     ESOPHAGOGASTRODUODENOSCOPY N/A 10/27/2017   Procedure: ESOPHAGOGASTRODUODENOSCOPY (EGD);  Surgeon: Toney Reil, MD;  Location: [facility] ENDOSCOPY;  Service: Gastroenterology;  Laterality: N/A;   ESOPHAGOGASTRODUODENOSCOPY (EGD) WITH PROPOFOL N/A 11/19/2017   Procedure: ESOPHAGOGASTRODUODENOSCOPY (EGD) WITH PROPOFOL;  Surgeon: Toney Reil, MD;  Location: [facility] ENDOSCOPY;  Service: Gastroenterology;  Laterality: N/A;   LEFT HEART CATH AND CORS/GRAFTS ANGIOGRAPHY N/A 10/14/2019   Procedure: LEFT HEART CATH AND CORS/GRAFTS ANGIOGRAPHY;  Surgeon: Lyn Records, MD;  Location: [facility] INVASIVE CV LAB;  Service: Cardiovascular;  Laterality: N/A;   LEFT HEART CATH AND CORS/GRAFTS ANGIOGRAPHY N/A 11/11/2021   Procedure: LEFT HEART CATH AND CORS/GRAFTS ANGIOGRAPHY;  Surgeon: Yvonne Kendall, MD;  Location: [facility] INVASIVE CV LAB;  Service: Cardiovascular;  Laterality: N/A;   LEFT HEART CATH AND CORS/GRAFTS ANGIOGRAPHY N/A 10/10/2022   Procedure: LEFT HEART CATH AND CORS/GRAFTS  ANGIOGRAPHY;  Surgeon: Swaz[place], Peter M, MD;  Location: [facility] INVASIVE CV LAB;  Service: Cardiovascular;  Laterality: N/A;    Allergies  No Known Allergies  History of Present Illness    Billy Cisneros. has a PMH of coronary artery disease status post CABG in 2000 (LIMA to LAD, RIMA to RCA, SVG to OM1, diagonal and ramus) underwent cardiac catheterization 10/2019 which showed patent LIMA-LAD, patent RIMA-PDA, total occluded distal left main, totally occluded ostial circumflex, totally occluded ostial LAD and normal LVEDP.  Medical management was recommended.  His PMH also includes HTN, anxiety, gout, and GERD.  He was seen in follow-up by Dr. Rennis Golden on 09/23/2019.  During that time he reported difficulty with chest discomfort.  He described exertional type chest pain with left arm heaviness.  His symptoms improved with rest.  It was noted that his LDL cholesterol was 62 but his triglycerides remained elevated at 320.  He underwent cardiac catheterization by Dr. Katrinka Blazing 6/21.  At that time his Imdur was uptitrated.  He presented to the clinic 10/30/20 for follow-up evaluation stated he had not noticed a difference in his chest discomfort with the addition of metoprolol.  He did not start his Imdur medication.  He reported that with increased physical activity he noticed chest discomfort.  He gave an example of taking out his trash.  When he would rest after increased activity his chest discomfort would ease off and go away.  We reviewed his angiography and he and his wife expressed understanding.  He reported that he generally was fairly sedentary.  He also reported that he did not eat  much fiber.  We reviewed his cholesterol panel and he reported that over the last several months he had not watched his diet as closely.  I had him start his Imdur, continue metoprolol, increase his physical activity, start fiber supplementation, and planned follow-up in 6 months.    He presented to the clinic 09/12/21 for  follow-up evaluation and stated he felt well.  He was unable to come to his last 2 appointments due to his daughter being in a car accident and then having flu.  We reviewed his current medication list and he is tolerating his medications well without side effects.  He did note some atypical chest discomfort at night when he laid down.  However, he denied exertional chest discomfort.  He had been walking on the treadmill daily and going to the [facility] 1 time per week.  He reported that he had been losing some weight.  I refilled his metoprolol, requested his updated lab work from his PCP and planned follow-up for 12 months.  He presented to the emergency department on 11/11/2021 with unstable angina.  He underwent left heart cath which showed severe coronary artery disease including subtotal chronic occlusion of the ostial LAD, proximal ramus, proximal circumflex and severe diffuse RCA disease.  He had a patent LIMA-LAD 30% stenosed, widely patent RIMA-RPDA, patent SVG-ramus, chronically occluded SVG-D.  Medical management was continued.  It was felt that running out of his isosorbide mononitrate brought on his recurrent angina.  He was discharged in stable condition on 11/12/2021.  He presents to the clinic 11/22/21 for follow-up evaluation stated that he had been off of his Imdur for about 5 days.  He did not have a new prescription of sublingual nitroglycerin.  He reported that he had nitro spray in the past but never used it and his prescription was old.  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  He occasionally lifted heavier materials in and out of his Billy Cisneros.  He did report a fall out of bed 11/21/21.  He reported he rolled the wrong way.  We reviewed the importance of heart healthy low-sodium diet.  We reviewed his angiography and he expressed understanding.  I refilled his sublingual nitroglycerin, continued his current medication regimen, and plan follow-up in 4 to 6 months.  He was admitted to the  hospital on 10/09/2022 and discharged on 10/11/2022.  He was noted to have NSTEMI.  His cardiac troponins trended up from 6-153, 441, and 461.  His EKG showed no acute ST changes.  He underwent cardiac catheterization on 10/10/2022.  He was not noted to have change from prior cardiac catheterization 7/23.  Medical management was continued.  His echocardiogram 10/10/2022 showed an EF of 55-60%, G1 DD, trivial mitral valve regurgitation.  He had presented with pain in his neck to [facility] ED.  CT spine showed no acute fracture but did note worsening moderate spinal stenosis along C4-C5 and severe bilateral neuroforaminal stenosis at C3-C4.  Recommendation to follow-up with neurosurgery was recommended.  He presents to the clinic today for follow-up evaluation and states he has been doing well.  He is working part-time.  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  He does note occasionally that when he lifts a heavy parts bin he will get chest discomfort.  His chest discomfort is relieved with rest and occasionally he needs to take a sublingual nitroglycerin.  We reviewed his spring cardiac cath and echocardiogram.  He expressed understanding.  He continues to have  cervical spine issues.  He has been prescribed physical therapy which she will be starting to see if he has any relief.  Will plan follow-up in 6 to 9 months and I will refill his Lovaza.  Today he denies chest pain, shortness of breath, lower extremity edema, fatigue, palpitations, melena, hematuria, hemoptysis, diaphoresis, weakness, presyncope, syncope, orthopnea, and PND.     Home Medications    Prior to Admission medications   Medication Sig Start Date End Date Taking? Authorizing Provider  ALPRAZolam (XANAX) 0.5 MG tablet TAKE 1 TABLET BY MOUTH 3 TIMES A DAY AS NEEDED FOR ANXIETY. Patient taking differently: Take 0.5 mg by mouth 3 (three) times daily as needed for anxiety. TAKE 1 TABLET BY MOUTH 3 TIMES A DAY AS NEEDED FOR ANXIETY.  06/24/17   Ellyn Hack, MD  aspirin EC 81 MG tablet Take 1 tablet (81 mg total) by mouth daily. For heart attack prevention. 11/22/11   Tamala Julian, PA-C  buPROPion (WELLBUTRIN XL) 300 MG 24 hr tablet Take 1 tablet (300 mg total) by mouth daily. 06/24/17   Ellyn Hack, MD  HYDROcodone-acetaminophen (NORCO/VICODIN) 5-325 MG tablet Take 1 tablet by mouth every 6 (six) hours as needed for moderate pain.  12/29/17   [provider]  icosapent Ethyl (VASCEPA) 1 g capsule Take 2 capsules (2 g total) by mouth 2 (two) times daily. 09/23/19   Hilty, Lisette Abu, MD  isosorbide mononitrate (IMDUR) 30 MG 24 hr tablet Take 1 tablet (30 mg total) by mouth daily. 10/14/19 10/13/20  Lyn Records, MD  lisinopril (PRINIVIL,ZESTRIL) 10 MG tablet Take 1 tablet (10 mg total) by mouth daily. 06/24/17   Ellyn Hack, MD  metoprolol succinate (TOPROL XL) 25 MG 24 hr tablet Take 1 tablet (25 mg total) by mouth daily. 10/14/19 10/13/20  Lyn Records, MD  Multiple Vitamins-Minerals (MULTIVITAMIN WITH MINERALS) tablet Take 1 tablet by mouth daily.    [provider]  omeprazole (PRILOSEC) 20 MG capsule Take 1 capsule (20 mg total) by mouth 2 (two) times daily before a meal. 06/24/17   Ellyn Hack, MD  simvastatin (ZOCOR) 40 MG tablet Take 1 tablet (40 mg total) by mouth at bedtime. 06/24/17   Ellyn Hack, MD  valACYclovir (VALTREX) 500 MG tablet Take 500 mg by mouth 2 (two) times daily as needed (outbreak).     [provider]  vitamin B-12 (CYANOCOBALAMIN) 500 MCG tablet Take 500 mcg by mouth daily.    [provider]  zolpidem (AMBIEN) 10 MG tablet Take 1 tablet (10 mg total) by mouth at bedtime. Patient taking differently: Take 10 mg by mouth at bedtime as needed for sleep.  06/24/17   Ellyn Hack, MD    Family History    Family History  Problem Relation Age of Onset   Heart attack Mother    Hyperlipidemia Mother    Hypertension Mother    Cancer Father         lung   Diabetes Sister    Diabetes Brother    Asthma Daughter    Cancer Maternal Grandmother    Stroke Maternal Grandfather    Heart attack Paternal Grandfather    He indicated that his mother is deceased. He indicated that his father is deceased. He indicated that his sister is alive. He indicated that his brother is alive. He indicated that his maternal grandmother is deceased. He indicated that his maternal grandfather is  deceased. He indicated that his paternal grandmother is deceased. He indicated that his paternal grandfather is deceased. He indicated that his daughter is alive.   Social History    Social History   Socioeconomic History   Marital status: Married    Spouse name: Not on file   Number of children: Not on file   Years of education: Not on file   Highest education level: Not on file  Occupational History   Not on file  Tobacco Use   Smoking status: Never   Smokeless tobacco: Never  Vaping Use   Vaping status: Never Used  Substance and Sexual Activity   Alcohol use: Yes    Alcohol/week: 0.0 standard drinks of alcohol    Comment: 3 beer a month   Drug use: Yes    Frequency: 2.0 times per week    Types: Cocaine    Comment: last use 15-20 years per patient   Sexual activity: Yes    Birth control/protection: None  Other Topics Concern   Not on file  Social History Narrative   Not on file   Social Drivers of Health   Financial Resource Strain: Low Risk  (02/05/2023)   Received from [name]   Overall Financial Resource Strain (CARDIA)    Difficulty of Paying Living Expenses: Not hard at all  Food Insecurity: No Food Insecurity (02/05/2023)   Received from [facility]   Hunger Vital Sign    Worried About Running Out of Food in the Last Year: Never true    Ran Out of Food in the Last Year: Never true  Transportation Needs: No Transportation Needs (02/05/2023)   Received from [facility]    In the past 12 months, has lack of transportation kept you from medical appointments or from getting medications?: No    Lack of Transportation (Non-Medical): No  Physical Activity: Not on file  Stress: Not on file  Social Connections: Not on file  Intimate Partner Violence: Not on file     Review of Systems    General:  No chills, fever, night sweats or weight changes.  Cardiovascular:  No chest pain, dyspnea on exertion, edema, orthopnea, palpitations, paroxysmal nocturnal dyspnea. Dermatological: No rash, lesions/masses Respiratory: No cough, dyspnea Urologic: No hematuria, dysuria Abdominal:   No nausea, vomiting, diarrhea, bright red blood per rectum, melena, or hematemesis Neurologic:  No visual changes, wkns, changes in mental status. All other systems reviewed and are otherwise negative except as noted above.  Physical Exam    VS:  BP 100/60   Pulse 77   Ht 5\' 4"  (1.626 m)   Wt 172 lb (78 kg)   SpO2 99%   BMI 29.52 kg/m  , BMI Body mass index is 29.52 kg/m. GEN: Well nourished, well developed, in no acute distress. HEENT: normal. Neck: Supple, no JVD, carotid bruits, or masses. Cardiac: RRR, no murmurs, rubs, or gallops. No clubbing, cyanosis, edema.  Radials/DP/PT 2+ and equal bilaterally.  Respiratory:  Respirations regular and unlabored, clear to auscultation bilaterally. GI: Soft, nontender, nondistended, BS + x 4. MS: no deformity or atrophy. Skin: warm and dry, no rash. Neuro:  Strength and sensation are intact. Psych: Normal affect.  Accessory Clinical Findings    Recent Labs: 10/10/2022: Hemoglobin 13.4; Platelets 254 10/11/2022: BUN 16; Creatinine, Ser 1.43; Potassium 4.4; Sodium 135   Recent Lipid Panel    Component Value Date/Time   CHOL 130 10/10/2022 0941  CHOL 146 01/08/2015 0925   CHOL 141 08/06/2011 0327   TRIG 141 10/10/2022 0941   TRIG 375 (H) 08/06/2011 0327   HDL 43 10/10/2022 0941   HDL 42 01/08/2015 0925    HDL 25 (L) 08/06/2011 0327   CHOLHDL 3.0 10/10/2022 0941   VLDL 28 10/10/2022 0941   VLDL 75 (H) 08/06/2011 0327   LDLCALC 59 10/10/2022 0941   LDLCALC 76 01/08/2015 0925   LDLCALC 41 08/06/2011 0327    ECG personally reviewed by me today none today.  EKG 09/12/2021  normal sinus rhythm 80 bpm  Cardiac catheterization 10/14/2019 Patent saphenous vein graft to the ramus intermedius but occlusion occlusion of the saphenous vein graft limb to the obtuse marginal.   Patent LIMA to distal LAD Patent RIMA to PDA Totally occluded distal left main Totally occluded ostial circumflex Totally occluded ostial LAD Normal LV end-diastolic pressure.   RECOMMENDATIONS:   Toprol-XL 25 mg/day After 1 week if angina is still significant, add Imdur 30 mg/day. Further titrate anti-ischemic therapy until relief of symptoms. No interventional targets.    Diagnostic Dominance: Right    Intervention   Cardiac Catheterization: 10/10/2022    Ost LM to Dist LM lesion is 50% stenosed.   Mid LAD lesion is 100% stenosed.   Ost LAD to Mid LAD lesion is 99% stenosed.   Ost Cx to Prox Cx lesion is 99% stenosed.   Origin lesion is 30% stenosed.   Origin to Insertion lesion between Ramus and 3rd Mrg  is 100% stenosed.   Origin to Insertion lesion is 100% stenosed.   3rd Mrg lesion is 100% stenosed.   LIMA and is small.   SVG and is normal in caliber.   SVG.   LV end diastolic pressure is normal.   Severe 3 vessel obstructive CAD Patent LIMA to the LAD Patent RIMA to the PDA Patent SVG to the ramus intermedius Normal LVEDP   Plan: no change compared to prior cardiac cath in July 2023. Continue medical management.  Assessment & Plan   1.  Coronary artery disease-no chest pain today.  Does note some chest discomfort with lifting heavy auto parts totes.  Relieved with rest and 1 sublingual nitroglycerin as needed.  Underwent cardiac catheterization 10/10/2022 in the setting of NSTEMI with  elevated troponins.  No changes were noted from his previous 7/23 cardiac catheterization and medical management was recommended.  Cardiac catheterization 11/11/21 showed severe coronary artery disease including subtotal chronic occlusion of the ostial LAD, proximal ramus, proximal circumflex and severe diffuse RCA disease.  He had a patent LIMA-LAD 30% stenosed, widely patent RIMA-RPDA, patent SVG-ramus, chronically occluded SVG-D.  Continue current medical therapy Heart healthy low-sodium diet Increase physical activity as tolerated   Essential hypertension-BP today 100/60  Maintain blood pressure log. Continue metoprolol, Imdur, lisinopril Heart healthy low-sodium diet Increase physical activity as tolerated  Hyperlipidemia-LDL 59 on 10/10/22 Continue Lovaza, Crestor, omega-3 fatty acids Heart healthy low-sodium high-fiber diet Increase physical activity as tolerated Refill Lovaza  Spinal stenosis-during admission 5/24 - 6/24.  He reports neck discomfort with lifting.  Spine CT showed moderate spinal stenosis along C4-C5 and severe bilateral neuroforaminal stenosis at C3-C4. Following with neurosurgery Proceed to physical therapy  Disposition: Follow-up with Dr. Rennis Golden in 6-9 months.  Billy Cisneros. Billy Donalson NP-C    06/02/2023, 2:43 PM [facility] [address] Office [phone] Fax [phone]    I spent 14  minutes examining this patient, reviewing medications, and  using patient centered shared decision making involving their cardiac care.  Prior to their visit I spent greater than 20 minutes reviewing  past medical history,  medications, and prior cardiac tests.

## 2023-06-02 ENCOUNTER — Encounter: Payer: Self-pay | Admitting: General Practice

## 2023-06-02 ENCOUNTER — Ambulatory Visit: Payer: Medicare Other | Attending: General Practice | Admitting: General Practice

## 2023-06-02 ENCOUNTER — Telehealth: Payer: Self-pay | Admitting: Neurosurgery

## 2023-06-02 VITALS — BP 100/60 | HR 77 | Ht 64.0 in | Wt 172.0 lb

## 2023-06-02 DIAGNOSIS — M4800 Spinal stenosis, site unspecified: Secondary | ICD-10-CM | POA: Diagnosis not present

## 2023-06-02 DIAGNOSIS — I2510 Atherosclerotic heart disease of native coronary artery without angina pectoris: Secondary | ICD-10-CM | POA: Diagnosis not present

## 2023-06-02 DIAGNOSIS — E785 Hyperlipidemia, unspecified: Secondary | ICD-10-CM | POA: Diagnosis not present

## 2023-06-02 DIAGNOSIS — I10 Essential (primary) hypertension: Secondary | ICD-10-CM

## 2023-06-02 MED ORDER — OMEGA-3-ACID ETHYL ESTERS 1 G PO CAPS: 1.0000 | ORAL_CAPSULE | Freq: Two times a day (BID) | ORAL | 2 refills | Status: DC

## 2023-06-02 NOTE — Patient Instructions (Addendum)
Medication Instructions:  The current medical regimen is effective;  continue present plan and medications as directed. Please refer to the Current Medication list given to you today.  *If you need a refill on your cardiac medications before your next appointment, please call your pharmacy*  Lab Work: NONE  Testing/Procedures: NONE  Other Instructions INCREASE PHYSICAL ACTIVITY-AS TOLERATED  Follow-Up: At [facility], you and your health needs are our priority.  As part of our continuing mission to provide you with exceptional heart care, we have created designated Provider Care Teams.  These Care Teams include your primary Cardiologist (physician) and Advanced Practice Providers (APPs -  Physician Assistants and Nurse Practitioners) who all work together to provide you with the care you need, when you need it.  Your next appointment:   6-9  month(s)  Provider:   Chrystie Nose, MD

## 2023-06-02 NOTE — Telephone Encounter (Signed)
Patient's wife called asking for a handicap placard for her husband. States he has had a lot of balance issues lately and trouble walking

## 2023-06-02 NOTE — Telephone Encounter (Signed)
Patient notified Placard form is up front for pick up.

## 2023-06-09 ENCOUNTER — Ambulatory Visit: Payer: Medicare Other

## 2023-06-17 ENCOUNTER — Telehealth: Payer: Self-pay

## 2023-06-17 ENCOUNTER — Ambulatory Visit: Payer: Medicare Other | Attending: Neurosurgery

## 2023-06-17 NOTE — Telephone Encounter (Signed)
 ATtempted to call patient in regards to no show eval, no answer and no VMB set up

## 2023-06-24 ENCOUNTER — Ambulatory Visit: Payer: Medicare Other | Admitting: Physical Therapy

## 2023-06-30 ENCOUNTER — Other Ambulatory Visit: Payer: Self-pay

## 2023-06-30 DIAGNOSIS — E782 Mixed hyperlipidemia: Secondary | ICD-10-CM

## 2023-06-30 DIAGNOSIS — I2510 Atherosclerotic heart disease of native coronary artery without angina pectoris: Secondary | ICD-10-CM

## 2023-06-30 DIAGNOSIS — I10 Essential (primary) hypertension: Secondary | ICD-10-CM

## 2023-06-30 MED ORDER — METOPROLOL SUCCINATE ER 50 MG PO TB24
50.0000 mg | ORAL_TABLET | Freq: Every day | ORAL | 3 refills | Status: DC
Start: 2023-06-30 — End: 2023-12-31

## 2023-07-01 ENCOUNTER — Ambulatory Visit: Payer: Medicare Other

## 2023-07-08 ENCOUNTER — Ambulatory Visit: Payer: Medicare Other

## 2023-07-09 ENCOUNTER — Ambulatory Visit: Payer: Medicare Other | Admitting: Neurosurgery

## 2023-07-14 ENCOUNTER — Ambulatory Visit: Payer: Medicare Other

## 2023-07-14 ENCOUNTER — Ambulatory Visit: Payer: Medicare Other | Admitting: Neurosurgery

## 2023-07-14 ENCOUNTER — Encounter: Payer: Self-pay | Admitting: Neurosurgery

## 2023-07-14 VITALS — BP 146/88 | Ht 64.0 in | Wt 172.0 lb

## 2023-07-14 DIAGNOSIS — G959 Disease of spinal cord, unspecified: Secondary | ICD-10-CM | POA: Diagnosis not present

## 2023-07-14 DIAGNOSIS — R2689 Other abnormalities of gait and mobility: Secondary | ICD-10-CM | POA: Diagnosis not present

## 2023-07-14 NOTE — Progress Notes (Signed)
 Referring Physician:  Lynnea Ferrier, MD [address] [facility] [place],  [place] [address]  Primary Physician:  Lynnea Ferrier, MD  History of Present Illness: 07/14/2023 He has been doing a little bit better.  He changed shoes.  He did not try physical therapy.  He has not had any falls.  05/12/2023 Billy Cisneros returns to see me.  He has had continued issues with falls.   03/19/2023 Billy Cisneros is here today with a chief complaint of low back pain with bilateral leg weakness with several falls recently.  He has issues where his legs gave out.  His left leg is worse than his right leg.  He also has numbness in his left leg worse than his right leg.  The numbness exists over his entire leg.  He has right arm numbness as well.  He has grip weakness and hand tingling.  He has started using a cane this year.  Last year, he did not use any assistance.  He is very hard of hearing and has had worsening hearing over the past several years.  He is also having tinnitus.    Bowel/Bladder Dysfunction: none  Conservative measures:  Physical therapy: has not participated in  Multimodal medical therapy including regular antiinflammatories: Prenisone, Methocarbamol, Hydrocodone Injections: has not received epidural steroid injections for his back  Past Surgery: no previous spinal surgeries   Billy Cisneros. has some symptoms of cervical myelopathy.  The symptoms are causing a significant impact on the patient's life.   I have utilized the care everywhere function in epic to review the outside records available from external health systems.  Review of Systems:  A 10 point review of systems is negative, except for the pertinent positives and negatives detailed in the HPI.  Past Medical History: Past Medical History:  Diagnosis Date   Anxiety    Coronary artery disease    Depression    GERD (gastroesophageal reflux disease)    Gout    Hypertension     NSTEMI (non-ST elevated myocardial infarction) (HCC) 10/09/2022   Unstable angina (HCC) 11/11/2021    Past Surgical History: Past Surgical History:  Procedure Laterality Date   CARDIAC CATHETERIZATION     CARDIAC SURGERY     COLONOSCOPY WITH PROPOFOL N/A 05/25/2017   Procedure: COLONOSCOPY WITH PROPOFOL;  Surgeon: Wyline Mood, MD;  Location: [facility] ENDOSCOPY;  Service: Gastroenterology;  Laterality: N/A;   CORONARY ARTERY BYPASS GRAFT     ESOPHAGOGASTRODUODENOSCOPY N/A 10/27/2017   Procedure: ESOPHAGOGASTRODUODENOSCOPY (EGD);  Surgeon: Toney Reil, MD;  Location: [facility] ENDOSCOPY;  Service: Gastroenterology;  Laterality: N/A;   ESOPHAGOGASTRODUODENOSCOPY (EGD) WITH PROPOFOL N/A 11/19/2017   Procedure: ESOPHAGOGASTRODUODENOSCOPY (EGD) WITH PROPOFOL;  Surgeon: Toney Reil, MD;  Location: [facility] ENDOSCOPY;  Service: Gastroenterology;  Laterality: N/A;   LEFT HEART CATH AND CORS/GRAFTS ANGIOGRAPHY N/A 10/14/2019   Procedure: LEFT HEART CATH AND CORS/GRAFTS ANGIOGRAPHY;  Surgeon: Lyn Records, MD;  Location: [facility] INVASIVE CV LAB;  Service: Cardiovascular;  Laterality: N/A;   LEFT HEART CATH AND CORS/GRAFTS ANGIOGRAPHY N/A 11/11/2021   Procedure: LEFT HEART CATH AND CORS/GRAFTS ANGIOGRAPHY;  Surgeon: Yvonne Kendall, MD;  Location: [facility] INVASIVE CV LAB;  Service: Cardiovascular;  Laterality: N/A;   LEFT HEART CATH AND CORS/GRAFTS ANGIOGRAPHY N/A 10/10/2022   Procedure: LEFT HEART CATH AND CORS/GRAFTS ANGIOGRAPHY;  Surgeon: Swaziland, Peter M, MD;  Location: [facility] INVASIVE CV LAB;  Service: Cardiovascular;  Laterality: N/A;    Allergies:  Allergies as of 07/14/2023   (No Known Allergies)    Medications:  Current Outpatient Medications:    allopurinol (ZYLOPRIM) 100 MG tablet, Take 100 mg by mouth as needed (gout flare up)., Disp: , Rfl:    ALPRAZolam (XANAX) 0.5 MG tablet, TAKE 1 TABLET BY MOUTH 3 TIMES A DAY AS NEEDED FOR ANXIETY., Disp: 90 tablet, Rfl: 2   aspirin EC 81 MG tablet, Take 1  tablet (81 mg total) by mouth daily. For heart attack prevention., Disp: , Rfl:    buPROPion (WELLBUTRIN XL) 300 MG 24 hr tablet, Take 1 tablet (300 mg total) by mouth daily., Disp: 90 tablet, Rfl: 0   colchicine 0.6 MG tablet, Take 0.6 mg by mouth daily as needed (gout)., Disp: , Rfl:    isosorbide mononitrate (IMDUR) 60 MG 24 hr tablet, Take 1 tablet (60 mg total) by mouth daily., Disp: 90 tablet, Rfl: 3   lisinopril (PRINIVIL,ZESTRIL) 10 MG tablet, Take 1 tablet (10 mg total) by mouth daily., Disp: 90 tablet, Rfl: 0   metoprolol succinate (TOPROL XL) 50 MG 24 hr tablet, Take 1 tablet (50 mg total) by mouth daily., Disp: 90 tablet, Rfl: 3   Multiple Vitamins-Minerals (MULTIVITAMIN WITH MINERALS) tablet, Take 1 tablet by mouth at bedtime., Disp: , Rfl:    nitroGLYCERIN (NITROSTAT) 0.4 MG SL tablet, Place 1 tablet (0.4 mg total) under the tongue every 5 (five) minutes as needed for chest pain., Disp: 25 tablet, Rfl: 4   omega-3 acid ethyl esters (LOVAZA) 1 g capsule, Take 1 capsule (1 g total) by mouth 2 (two) times daily., Disp: 90 capsule, Rfl: 2   omeprazole (PRILOSEC) 20 MG capsule, Take 1 capsule (20 mg total) by mouth 2 (two) times daily before a meal. (Patient taking differently: Take 40 mg by mouth daily.), Disp: 180 capsule, Rfl: 0   predniSONE (DELTASONE) 10 MG tablet, Take by mouth as needed., Disp: , Rfl:    rosuvastatin (CRESTOR) 20 MG tablet, Take 1 tablet (20 mg total) by mouth at bedtime., Disp: 90 tablet, Rfl: 3   valACYclovir (VALTREX) 500 MG tablet, Take 500 mg by mouth 2 (two) times daily as needed (outbreak). , Disp: , Rfl:    zolpidem (AMBIEN) 10 MG tablet, Take 1 tablet (10 mg total) by mouth at bedtime., Disp: 30 tablet, Rfl: 2  Social History: Social History   Tobacco Use   Smoking status: Never   Smokeless tobacco: Never  Vaping Use   Vaping status: Never Used  Substance Use Topics   Alcohol use: Yes    Alcohol/week: 0.0 standard drinks of alcohol    Comment: 3  beer a month   Drug use: Yes    Frequency: 2.0 times per week    Types: Cocaine    Comment: last use 15-20 years per patient    Family Medical History: Family History  Problem Relation Age of Onset   Heart attack Mother    Hyperlipidemia Mother    Hypertension Mother    Cancer Father        lung   Diabetes Sister    Diabetes Brother    Asthma Daughter    Cancer Maternal Grandmother    Stroke Maternal Grandfather    Heart attack Paternal Grandfather     Physical Examination: Vitals:   07/14/23 0936  BP: (!) 146/88    General: Patient is in no apparent distress. Attention to examination is appropriate.  Neck:   Supple.  Full range of motion.  Respiratory: Patient  is breathing without any difficulty.   NEUROLOGICAL:     Awake, alert, oriented to person, place, and time.  Speech is clear and fluent.   Cranial Nerves: Pupils equal round and reactive to light.  Facial tone is symmetric.  Facial sensation is symmetric. Shoulder shrug is symmetric. Tongue protrusion is midline.  There is no pronator drift.  Strength: Side Biceps Triceps Deltoid Interossei Grip Wrist Ext. Wrist Flex.  R 5 4+ 5 5 4+ 5 5  L 5 4+ 5 5 4+ 5 5   Side Iliopsoas Quads Hamstring PF DF EHL  R 5 5 5 5 5 5   L 5 5 5 5 5 5    Reflexes are 1+ and symmetric at the biceps, triceps, brachioradialis, patella and achilles.   Hoffman's is absent.   Bilateral upper and lower extremity shows diminished right arm and left leg sensation in a nondermatomal pattern.  Mild evidence of dysmetria noted.  Gait is wide-based.  He cannot complete tandem gait.     Medical Decision Making  Imaging: MRI L spine 02/17/2023 Disc levels:   T12-L1:  Normal.   L1-L2:  Disc bulge results in mild spinal canal stenosis.   L2-L3: Right eccentric disc bulge and facet arthropathy results in compression of the traversing right L3 nerve root in the subarticular zone and moderate right neural foraminal narrowing.   L3-L4:  Disc bulge and facet arthropathy results in mild spinal canal stenosis.   L4-L5: Left eccentric disc bulge and facet arthropathy results in compression of the traversing left L5 nerve root in the subarticular zone and moderate bilateral neural foraminal narrowing.   L5-S1: Right central disc protrusion with annular fissure results in compression of the traversing right S1 nerve root in the subarticular zone.   IMPRESSION: 1. Multilevel lumbar spondylosis, worst at L2-L3, L4-L5, and L5-S1, where there is compression of the traversing right L3, left L5, and right S1 nerve roots in the subarticular zones, respectively. 2. Moderate right neural foraminal narrowing at L2-L3 and moderate bilateral neural foraminal narrowing at L4-L5.     Electronically Signed   By: Orvan Falconer M.D.   On: 03/06/2023 15:42   MRI CT spine 04/07/23 IMPRESSION: 1. Multilevel cervical spondylosis appears similar to the 2019 exam. The ventral thecal sac is effaced at C3-4, C4-5 and C6-7. There is deformity of the ventral cord at C4-5 and C5-6. 2. Severe left foraminal narrowing C3-4, moderate to moderately severe bilateral foraminal narrowing at C4-5 and moderately severe right foraminal narrowing at C6-7.     Electronically Signed   By: Drusilla Kanner M.D.   On: 04/23/2023 09:48  IMPRESSION: No evidence of high-grade spinal canal or neural foraminal stenosis. No cord signal abnormality.     Electronically Signed   By: Lorenza Cambridge M.D.   On: 04/22/2023 14:57  I have personally reviewed the images and agree with the above interpretation.  Assessment and Plan: Billy Cisneros is a pleasant 73 y.o. male with cervical myelopathy.  His symptoms are currently mild.  We will continue to monitor for now.  He will return to clinic if his symptoms worsen.  We discussed the warning signs for cervical myelopathy.  I spent a total of 10 minutes in this patient's care today. This time was spent  reviewing pertinent records including imaging studies, obtaining and confirming history, performing a directed evaluation, formulating and discussing my recommendations, and documenting the visit within the medical record.     Thank you for involving me in  the care of this patient.      Billy Cisneros K. Myer Haff MD, [facility] Neurosurgery

## 2023-07-17 ENCOUNTER — Ambulatory Visit: Payer: Medicare Other | Admitting: Physical Therapy

## 2023-07-20 ENCOUNTER — Ambulatory Visit: Payer: Medicare Other | Admitting: Physical Therapy

## 2023-07-24 ENCOUNTER — Ambulatory Visit: Payer: Medicare Other | Admitting: Physical Therapy

## 2023-07-28 ENCOUNTER — Ambulatory Visit: Payer: Medicare Other

## 2023-07-30 ENCOUNTER — Ambulatory Visit: Payer: Medicare Other

## 2023-08-04 ENCOUNTER — Ambulatory Visit: Payer: Medicare Other

## 2023-08-06 ENCOUNTER — Ambulatory Visit: Payer: Medicare Other

## 2023-08-11 ENCOUNTER — Ambulatory Visit: Payer: Medicare Other

## 2023-08-13 ENCOUNTER — Ambulatory Visit: Payer: Medicare Other

## 2023-08-18 ENCOUNTER — Ambulatory Visit: Payer: Medicare Other

## 2023-08-20 ENCOUNTER — Ambulatory Visit: Payer: Medicare Other

## 2023-08-25 ENCOUNTER — Ambulatory Visit: Payer: Medicare Other

## 2023-08-27 ENCOUNTER — Ambulatory Visit: Payer: Medicare Other

## 2023-09-01 ENCOUNTER — Ambulatory Visit: Payer: Medicare Other

## 2023-09-03 ENCOUNTER — Ambulatory Visit: Payer: Medicare Other

## 2023-09-08 ENCOUNTER — Ambulatory Visit: Payer: Medicare Other

## 2023-09-10 ENCOUNTER — Ambulatory Visit: Payer: Medicare Other

## 2023-09-15 ENCOUNTER — Ambulatory Visit: Payer: Medicare Other

## 2023-09-17 ENCOUNTER — Ambulatory Visit: Payer: Medicare Other

## 2023-09-22 ENCOUNTER — Ambulatory Visit: Payer: Medicare Other

## 2023-09-24 ENCOUNTER — Ambulatory Visit: Payer: Medicare Other

## 2023-09-29 ENCOUNTER — Ambulatory Visit: Payer: Medicare Other

## 2023-10-01 ENCOUNTER — Ambulatory Visit: Payer: Medicare Other

## 2023-10-06 ENCOUNTER — Ambulatory Visit: Payer: Medicare Other

## 2023-10-08 ENCOUNTER — Ambulatory Visit: Payer: Medicare Other

## 2023-10-13 ENCOUNTER — Ambulatory Visit: Payer: Medicare Other

## 2023-10-15 ENCOUNTER — Ambulatory Visit: Payer: Medicare Other

## 2023-10-20 ENCOUNTER — Ambulatory Visit: Payer: Medicare Other

## 2023-10-22 ENCOUNTER — Ambulatory Visit: Payer: Medicare Other

## 2023-10-27 ENCOUNTER — Ambulatory Visit: Payer: Medicare Other

## 2023-10-29 ENCOUNTER — Ambulatory Visit: Payer: Medicare Other

## 2023-11-02 ENCOUNTER — Telehealth: Payer: Self-pay | Admitting: Neurosurgery

## 2023-11-02 NOTE — Telephone Encounter (Signed)
 Ok for handicap placard if so for how long?  Last seen in March 2025, no follow up appt.

## 2023-11-02 NOTE — Telephone Encounter (Signed)
 Patient states his handicap placard expires in July, he is requesting a new one and would like a call back when this is ready. Please advise

## 2023-11-03 NOTE — Telephone Encounter (Signed)
 Left message informing patient the Placard is up front for pick up and if he needs another placard after 6 months he will need to make an appointment.

## 2023-11-03 NOTE — Telephone Encounter (Signed)
 Patient notified

## 2023-11-10 ENCOUNTER — Other Ambulatory Visit: Payer: Self-pay

## 2023-11-10 MED ORDER — OMEGA-3-ACID ETHYL ESTERS 1 G PO CAPS: 1.0000 | ORAL_CAPSULE | Freq: Two times a day (BID) | ORAL | 1 refills | Status: DC

## 2023-12-01 ENCOUNTER — Ambulatory Visit: Admitting: Internal Medicine

## 2023-12-17 ENCOUNTER — Ambulatory Visit: Admitting: Neurosurgery

## 2023-12-17 ENCOUNTER — Encounter: Payer: Self-pay | Admitting: Neurosurgery

## 2023-12-17 VITALS — BP 134/82 | Ht 65.0 in | Wt 179.4 lb

## 2023-12-17 DIAGNOSIS — M4802 Spinal stenosis, cervical region: Secondary | ICD-10-CM | POA: Diagnosis not present

## 2023-12-17 DIAGNOSIS — G959 Disease of spinal cord, unspecified: Secondary | ICD-10-CM | POA: Diagnosis not present

## 2023-12-17 NOTE — Patient Instructions (Signed)
 Please see below for information in regards to your upcoming surgery:   Planned surgery: C3-7 laminoplasty   Surgery date: 02/24/24 at [facility]: [address], [place], [place] [address]) - you will find out your arrival time the business day before your surgery.   Pre-op appointment at [facility] Pre-admit Testing: you will receive a call with a date/time for this appointment. If you are scheduled for an in person appointment, Pre-admit Testing is located on the first floor of the [facility] building, [address], [address]. During this appointment, they will advise you which medications you can take the morning of surgery, and which medications you will need to hold for surgery. Labs (such as blood work, EKG) may be done at your pre-op appointment. You are not required to fast for these labs. Should you need to change your pre-op appointment, please call Pre-admit testing at [phone].     Blood thinners:    Aspirin  81mg :  OK to stay on aspirin  81mg     Surgical clearance: we will send a clearance form to [facility]. They may wish to see you in their office prior to signing the clearance form. If so, they may call you to schedule an appointment.      Common restrictions after spine surgery: No bending, lifting, or twisting ("BLT"). Avoid lifting objects heavier than 10 pounds for the first 6 weeks after surgery. Where possible, avoid household activities that involve lifting, bending, reaching, pushing, or pulling such as laundry, vacuuming, grocery shopping, and childcare. Try to arrange for help from friends and family for these activities while you heal. Do not drive while taking prescription pain medication. Weeks 6 through 12 after surgery: avoid lifting more than 25 pounds.      How to contact us :  If you have any questions/concerns before or after surgery, you can reach us  at [phone], or you can send a  mychart message. We can be reached by phone or mychart 8am-4pm, Monday-Friday.  *Please note: Calls after 4pm are forwarded to a third party answering service. Mychart messages are not routinely monitored during evenings, weekends, and holidays. Please call our office to contact the answering service for urgent concerns during non-business hours.    If you have FMLA/disability paperwork, please drop it off or fax it to [phone]   Appointments/FMLA & disability paperwork: [employer] Registered Nurse/Surgery scheduler: Ekta Dancer, RN Certified Medical Assistants: Don, CMA, Elenor, CMA, & Damien, CMA Physician Assistants: Lyle Decamp, PA-C, Edsel Goods, PA-C & Glade Boys, PA-C Surgeons: Penne Sharps, MD & Reeves Daisy, MD   [facility] PREADMIT TESTING VISIT and SURGERY INFORMATION SHEET   Now that surgery has been scheduled you can anticipate several phone calls from [facility] services. A pharmacy technician will call you to verify your current list of medications taken at home.               The Pre-Service Center will call to verify your insurance information and to give you billing estimates and information.             The Preadmit Testing Office will be calling to schedule a visit to obtain information for the anesthesia team and provide instructions on preparation for surgery.  What can you expect for the Preadmit Testing Visit: Appointments may be scheduled in-person or by telephone.  If a telephone visit is scheduled, you may be asked to come into the office to have lab  tests or other studies performed.   This visit will not be completed any greater than 14 days prior to your surgery.  If your surgery has been scheduled for a future date, please do not be alarmed if we have not contacted you to schedule an appointment more than a month prior to the surgery date.    Please be prepared to provide the following information during this  appointment:            -Personal medical history                                               -Medication and allergy list            -Any history of problems with anesthesia              -Recent lab work or diagnostic studies            -Please notify us  of any needs we should be aware of to provide the best care possible           -You will be provided with instructions on how to prepare for your surgery.    On The Day of Surgery:  You must have a driver to take you home after surgery, you will be asked not to drive for 24 hours following surgery.  Taxi, Gisele and non-medical transport will not be acceptable means of transportation unless you have a responsible individual who will be traveling with you.  Visitors in the surgical area:   2 people will be able to visit you in your room once your preparation for surgery has been completed. During surgery, your visitors will be asked to wait in the Surgery Waiting Area.  It is not a requirement for them to stay, if they prefer to leave and come back.  Your visitor(s) will be given an update once the surgery has been completed.  No visitors are allowed in the initial recovery room to respect patient privacy and safety.  Once you are more awake and transfer to the secondary recovery area, or are transferred to an inpatient room, visitors will again be able to see you.  To respect and protect your privacy: We will ask on the day of surgery who your driver will be and what the contact number for that individual will be. We will ask if it is okay to share information with this individual, or if there is an alternative individual that we, or the surgeon, should contact to provide updates and information. If family or friends come to the surgical information desk requesting information about you, who you have not listed with us , no information will be given.   It may be helpful to designate someone as the main contact who will be responsible for  updating your other friends and family.    PREADMIT TESTING OFFICE: [phone] SAME DAY SURGERY: [phone] We look forward to caring for you before and throughout the process of your surgery.

## 2023-12-17 NOTE — Progress Notes (Signed)
 Referring Physician:  No referring provider defined for this encounter.  Primary Physician:  [name], MD  History of Present Illness: 12/17/2023 Billy Cisneros presents with worsening balance.  He also has numbness in his hands.   07/14/2023 He has been doing a little bit better.  He changed shoes.  He did not try physical therapy.  He has not had any falls.  05/12/2023 Billy Cisneros returns to see me.  He has had continued issues with falls.   03/19/2023 Billy Cisneros is here today with a chief complaint of low back pain with bilateral leg weakness with several falls recently.  He has issues where his legs gave out.  His left leg is worse than his right leg.  He also has numbness in his left leg worse than his right leg.  The numbness exists over his entire leg.  He has right arm numbness as well.  He has grip weakness and hand tingling.  He has started using a cane this year.  Last year, he did not use any assistance.  He is very hard of hearing and has had worsening hearing over the past several years.  He is also having tinnitus.    Bowel/Bladder Dysfunction: none  Conservative measures:  Physical therapy: has not participated in  Multimodal medical therapy including regular antiinflammatories: Prenisone, Methocarbamol , Hydrocodone  Injections: has not received epidural steroid injections for his back  Past Surgery: no previous spinal surgeries   Billy Cisneros. has some symptoms of cervical myelopathy.  The symptoms are causing a significant impact on the patient's life.   I have utilized the care everywhere function in epic to review the outside records available from external health systems.  Review of Systems:  A 10 point review of systems is negative, except for the pertinent positives and negatives detailed in the HPI.  Past Medical History: Past Medical History:  Diagnosis Date   Anxiety    Coronary artery disease    Depression    GERD  (gastroesophageal reflux disease)    Gout    Hypertension    NSTEMI (non-ST elevated myocardial infarction) (HCC) 10/09/2022   Unstable angina (HCC) 11/11/2021    Past Surgical History: Past Surgical History:  Procedure Laterality Date   CARDIAC CATHETERIZATION     CARDIAC SURGERY     COLONOSCOPY WITH PROPOFOL  N/A 05/25/2017   Procedure: COLONOSCOPY WITH PROPOFOL ;  Surgeon: Therisa Bi, MD;  Location: [facility] ENDOSCOPY;  Service: Gastroenterology;  Laterality: N/A;   CORONARY ARTERY BYPASS GRAFT     ESOPHAGOGASTRODUODENOSCOPY N/A 10/27/2017   Procedure: ESOPHAGOGASTRODUODENOSCOPY (EGD);  Surgeon: Unk Corinn Skiff, MD;  Location: [facility] ENDOSCOPY;  Service: Gastroenterology;  Laterality: N/A;   ESOPHAGOGASTRODUODENOSCOPY (EGD) WITH PROPOFOL  N/A 11/19/2017   Procedure: ESOPHAGOGASTRODUODENOSCOPY (EGD) WITH PROPOFOL ;  Surgeon: Unk Corinn Skiff, MD;  Location: [facility] ENDOSCOPY;  Service: Gastroenterology;  Laterality: N/A;   LEFT HEART CATH AND CORS/GRAFTS ANGIOGRAPHY N/A 10/14/2019   Procedure: LEFT HEART CATH AND CORS/GRAFTS ANGIOGRAPHY;  Surgeon: Claudene Victory ORN, MD;  Location: [facility] INVASIVE CV LAB;  Service: Cardiovascular;  Laterality: N/A;   LEFT HEART CATH AND CORS/GRAFTS ANGIOGRAPHY N/A 11/11/2021   Procedure: LEFT HEART CATH AND CORS/GRAFTS ANGIOGRAPHY;  Surgeon: Mady Bruckner, MD;  Location: [facility] INVASIVE CV LAB;  Service: Cardiovascular;  Laterality: N/A;   LEFT HEART CATH AND CORS/GRAFTS ANGIOGRAPHY N/A 10/10/2022   Procedure: LEFT HEART CATH AND CORS/GRAFTS ANGIOGRAPHY;  Surgeon: Swaz[place], Peter M, MD;  Location: [facility] INVASIVE CV LAB;  Service:  Cardiovascular;  Laterality: N/A;    Allergies: Allergies as of 12/17/2023   (No Known Allergies)    Medications:  Current Outpatient Medications:    allopurinol (ZYLOPRIM) 100 MG tablet, Take 100 mg by mouth as needed (gout flare up)., Disp: , Rfl:    ALPRAZolam  (XANAX ) 0.5 MG tablet, TAKE 1 TABLET BY MOUTH 3 TIMES A DAY AS NEEDED FOR ANXIETY.,  Disp: 90 tablet, Rfl: 2   aspirin  EC 81 MG tablet, Take 1 tablet (81 mg total) by mouth daily. For heart attack prevention., Disp: , Rfl:    buPROPion  (WELLBUTRIN  XL) 300 MG 24 hr tablet, Take 1 tablet (300 mg total) by mouth daily., Disp: 90 tablet, Rfl: 0   colchicine  0.6 MG tablet, Take 0.6 mg by mouth daily as needed (gout)., Disp: , Rfl:    isosorbide  mononitrate (IMDUR ) 60 MG 24 hr tablet, Take 1 tablet (60 mg total) by mouth daily., Disp: 90 tablet, Rfl: 3   lisinopril  (PRINIVIL ,ZESTRIL ) 10 MG tablet, Take 1 tablet (10 mg total) by mouth daily., Disp: 90 tablet, Rfl: 0   metoprolol  succinate (TOPROL  XL) 50 MG 24 hr tablet, Take 1 tablet (50 mg total) by mouth daily., Disp: 90 tablet, Rfl: 3   Multiple Vitamins-Minerals (MULTIVITAMIN WITH MINERALS) tablet, Take 1 tablet by mouth at bedtime., Disp: , Rfl:    nitroGLYCERIN  (NITROSTAT ) 0.4 MG SL tablet, Place 1 tablet (0.4 mg total) under the tongue every 5 (five) minutes as needed for chest pain., Disp: 25 tablet, Rfl: 4   omega-3 acid ethyl esters (LOVAZA ) 1 g capsule, Take 1 capsule (1 g total) by mouth 2 (two) times daily., Disp: 180 capsule, Rfl: 1   omeprazole  (PRILOSEC) 20 MG capsule, Take 1 capsule (20 mg total) by mouth 2 (two) times daily before a meal. (Patient taking differently: Take 40 mg by mouth daily.), Disp: 180 capsule, Rfl: 0   predniSONE  (DELTASONE ) 10 MG tablet, Take by mouth as needed., Disp: , Rfl:    rosuvastatin  (CRESTOR ) 20 MG tablet, Take 1 tablet (20 mg total) by mouth at bedtime., Disp: 90 tablet, Rfl: 3   valACYclovir  (VALTREX ) 500 MG tablet, Take 500 mg by mouth 2 (two) times daily as needed (outbreak). , Disp: , Rfl:    zolpidem  (AMBIEN ) 10 MG tablet, Take 1 tablet (10 mg total) by mouth at bedtime., Disp: 30 tablet, Rfl: 2  Social History: Social History   Tobacco Use   Smoking status: Never   Smokeless tobacco: Never  Vaping Use   Vaping status: Never Used  Substance Use Topics   Alcohol  use: Yes     Alcohol /week: 0.0 standard drinks of alcohol     Comment: 3 beer a month   Drug use: Yes    Frequency: 2.0 times per week    Types: Cocaine    Comment: last use 15-20 years per patient    Family Medical History: Family History  Problem Relation Age of Onset   Heart attack Mother    Hyperlipidemia Mother    Hypertension Mother    Cancer Father        lung   Diabetes Sister    Diabetes Brother    Asthma Daughter    Cancer Maternal Grandmother    Stroke Maternal Grandfather    Heart attack Paternal Grandfather     Physical Examination: Vitals:   12/17/23 1338  BP: 134/82    General: Patient is in no apparent distress. Attention to examination is appropriate.  Neck:   Supple.  Full range of motion.  Respiratory: Patient is breathing without any difficulty.   NEUROLOGICAL:     Awake, alert, oriented to person, place, and time.  Speech is clear and fluent.   Cranial Nerves: Pupils equal round and reactive to light.  Facial tone is symmetric.  Facial sensation is symmetric. Shoulder shrug is symmetric. Tongue protrusion is midline.  There is no pronator drift.  Strength: Side Biceps Triceps Deltoid Interossei Grip Wrist Ext. Wrist Flex.  R 5 4+ 5 5 4+ 5 5  L 5 4+ 5 5 4+ 5 5   Side Iliopsoas Quads Hamstring PF DF EHL  R 5 5 5 5 5 5   L 5 5 5 5 5 5    Reflexes are 1+ and symmetric at the biceps, triceps, brachioradialis, patella and achilles.   Hoffman's is absent.   Bilateral upper and lower extremity shows diminished right arm and left leg sensation in a nondermatomal pattern.  Mild evidence of dysmetria noted.  Gait is wide-based.  He cannot complete tandem gait.  He has to use a cane.   Medical Decision Making  Imaging: MRI L spine 02/17/2023 Disc levels:   T12-L1:  Normal.   L1-L2:  Disc bulge results in mild spinal canal stenosis.   L2-L3: Right eccentric disc bulge and facet arthropathy results in compression of the traversing right L3 nerve root in  the subarticular zone and moderate right neural foraminal narrowing.   L3-L4: Disc bulge and facet arthropathy results in mild spinal canal stenosis.   L4-L5: Left eccentric disc bulge and facet arthropathy results in compression of the traversing left L5 nerve root in the subarticular zone and moderate bilateral neural foraminal narrowing.   L5-S1: Right central disc protrusion with annular fissure results in compression of the traversing right S1 nerve root in the subarticular zone.   IMPRESSION: 1. Multilevel lumbar spondylosis, worst at L2-L3, L4-L5, and L5-S1, where there is compression of the traversing right L3, left L5, and right S1 nerve roots in the subarticular zones, respectively. 2. Moderate right neural foraminal narrowing at L2-L3 and moderate bilateral neural foraminal narrowing at L4-L5.     Electronically Signed   By: Ryan Chess M.D.   On: 03/06/2023 15:42   MRI CT spine 04/07/23 IMPRESSION: 1. Multilevel cervical spondylosis appears similar to the 2019 exam. The ventral thecal sac is effaced at C3-4, C4-5 and C6-7. There is deformity of the ventral cord at C4-5 and C5-6. 2. Severe left foraminal narrowing C3-4, moderate to moderately severe bilateral foraminal narrowing at C4-5 and moderately severe right foraminal narrowing at C6-7.     Electronically Signed   By: Debby Prader M.D.   On: 04/23/2023 09:48  IMPRESSION: No evidence of high-grade spinal canal or neural foraminal stenosis. No cord signal abnormality.     Electronically Signed   By: Lyndall Gore M.D.   On: 04/22/2023 14:57  I have personally reviewed the images and agree with the above interpretation.  Assessment and Plan: Billy Cisneros is a pleasant 73 y.o. male with cervical myelopathy.  His symptoms have worsened.  At this point, I recommended surgical decompression with a laminoplasty.    I discussed the planned procedure at length with the patient, including the risks,  benefits, alternatives, and indications. The risks discussed include but are not limited to bleeding, infection, need for reoperation, spinal fluid leak, stroke, vision loss, anesthetic complication, coma, paralysis, and even death. I also described in detail that improvement was not guaranteed.  The  patient expressed understanding of these risks, and asked that we proceed with surgery. I described the surgery in layman's terms, and gave ample opportunity for questions, which were answered to the best of my ability.  I spent a total of 20 minutes in this patient's care today. This time was spent reviewing pertinent records including imaging studies, obtaining and confirming history, performing a directed evaluation, formulating and discussing my recommendations, and documenting the visit within the medical record.     Thank you for involving me in the care of this patient.      Menaal Russum K. Clois MD, [facility]

## 2023-12-18 ENCOUNTER — Telehealth: Payer: Self-pay

## 2023-12-18 NOTE — Telephone Encounter (Signed)
   Pre-operative Risk Assessment    Patient Name: Billy Cisneros.  DOB: [birth year] MRN: [id number]   Date of last office visit: 06/02/23 JOSEFA BEAUVAIS, NP Date of next office visit: 02/15/24 JULIA MAXCY, MD   Request for Surgical Clearance    Procedure:  C3-7 laminoplasty  Date of Surgery:  Clearance 02/24/24                                Surgeon:  Reeves Daisy, MD Surgeon's Group or Practice Name:  [facility] at [facility] number:  [phone] Fax number:  [phone]   Type of Clearance Requested:   - Medical  - Pharmacy:  Hold Aspirin  (PER REQUEST OK to stay on aspirin  81 mg)   Type of Anesthesia:  General    Additional requests/questions:    Signed, Lucie DELENA Ku   12/18/2023, 4:10 PM     Daisy Reeves, MD on 12/17/2023     [facility] at [facility] [address], [address] [place], [place]  [address] Phone:  [phone]   Fax:  [phone]   Patient: Billy Cisneros.         DOB: 10-03-1950   Date sent: 12/17/2023               Faxed to: [facility]   Surgeon: Reeves Daisy, MD    Date of procedure: 02/24/2024   Planned procedure & Anesthesia Type: C3-7 laminoplasty/general anesthesia    Should you choose to see this patient in your office to provide clearance, please ask your office staff to contact the patient directly.  Please fax your evaluation and any supporting documentation as soon as completed.  Thank you! Your assistance is greatly appreciated!     *OK to stay on aspirin  81mg       Is this patient optimized to have the above procedure?  Yes   or   No   If NO, please indicate further studies/evaluations recommended     Risk:    Low    Moderate    High     Remarks:      Physician Name: _______________________________      Physician Signature: _______________________________ Date:________________

## 2023-12-18 NOTE — Telephone Encounter (Signed)
 Preop team,  Patient has scheduled appointment with Dr. Mona on 02/15/2024.  His procedure is scheduled for 02/24/2024.  Please add preoperative cardiac evaluation to appointment notes.  I will defer cardiac evaluation to upcoming appointment.  Thank you for your help.  Josefa HERO. Karianna Gusman NP-C     12/18/2023, 4:59 PM [facility] [address] Office [phone] Fax [phone]

## 2023-12-24 ENCOUNTER — Other Ambulatory Visit: Payer: Self-pay

## 2023-12-24 ENCOUNTER — Other Ambulatory Visit: Payer: Self-pay | Admitting: Internal Medicine

## 2023-12-24 MED ORDER — ISOSORBIDE MONONITRATE ER 60 MG PO TB24: 60.0000 mg | ORAL_TABLET | Freq: Every day | ORAL | 3 refills | Status: DC

## 2023-12-24 MED ORDER — ISOSORBIDE MONONITRATE ER 60 MG PO TB24: 60.0000 mg | ORAL_TABLET | Freq: Every day | ORAL | 1 refills | Status: DC

## 2023-12-24 NOTE — Addendum Note (Signed)
 Addended by: BLUFORD, Dewell Monnier L on: 12/24/2023 09:59 AM   Modules accepted: Orders

## 2023-12-31 ENCOUNTER — Ambulatory Visit: Attending: General Practice | Admitting: General Practice

## 2023-12-31 ENCOUNTER — Encounter: Payer: Self-pay | Admitting: General Practice

## 2023-12-31 VITALS — BP 108/68 | HR 63 | Ht 65.0 in | Wt 181.0 lb

## 2023-12-31 DIAGNOSIS — M4800 Spinal stenosis, site unspecified: Secondary | ICD-10-CM

## 2023-12-31 DIAGNOSIS — I10 Essential (primary) hypertension: Secondary | ICD-10-CM | POA: Diagnosis not present

## 2023-12-31 DIAGNOSIS — R079 Chest pain, unspecified: Secondary | ICD-10-CM

## 2023-12-31 DIAGNOSIS — I2510 Atherosclerotic heart disease of native coronary artery without angina pectoris: Secondary | ICD-10-CM | POA: Diagnosis not present

## 2023-12-31 DIAGNOSIS — E782 Mixed hyperlipidemia: Secondary | ICD-10-CM | POA: Diagnosis not present

## 2023-12-31 MED ORDER — NITROGLYCERIN 0.4 MG SL SUBL: 0.4000 mg | SUBLINGUAL_TABLET | SUBLINGUAL | 5 refills | Status: AC | PRN

## 2023-12-31 MED ORDER — METOPROLOL SUCCINATE ER 25 MG PO TB24: 25.0000 mg | ORAL_TABLET | Freq: Every day | ORAL | 3 refills | Status: AC

## 2023-12-31 NOTE — Progress Notes (Signed)
 Cardiology Clinic Note   Patient Name: Billy Cisneros. Date of Encounter: 12/31/2023  Primary Care Provider:  [name], MD Primary Cardiologist:  Billy JAYSON Maxcy, MD  Patient Profile    Billy Cisneros. 73 year old male presents the clinic today for follow-up evaluation of his coronary artery disease .  Past Medical History    Past Medical History:  Diagnosis Date   Anxiety    Coronary artery disease    Depression    GERD (gastroesophageal reflux disease)    Gout    Hypertension    NSTEMI (non-ST elevated myocardial infarction) (HCC) 10/09/2022   Unstable angina (HCC) 11/11/2021   Past Surgical History:  Procedure Laterality Date   CARDIAC CATHETERIZATION     CARDIAC SURGERY     COLONOSCOPY WITH PROPOFOL  N/A 05/25/2017   Procedure: COLONOSCOPY WITH PROPOFOL ;  Surgeon: Therisa Bi, MD;  Location: [facility] ENDOSCOPY;  Service: Gastroenterology;  Laterality: N/A;   CORONARY ARTERY BYPASS GRAFT     ESOPHAGOGASTRODUODENOSCOPY N/A 10/27/2017   Procedure: ESOPHAGOGASTRODUODENOSCOPY (EGD);  Surgeon: Unk Corinn Skiff, MD;  Location: [facility] ENDOSCOPY;  Service: Gastroenterology;  Laterality: N/A;   ESOPHAGOGASTRODUODENOSCOPY (EGD) WITH PROPOFOL  N/A 11/19/2017   Procedure: ESOPHAGOGASTRODUODENOSCOPY (EGD) WITH PROPOFOL ;  Surgeon: Unk Corinn Skiff, MD;  Location: [facility] ENDOSCOPY;  Service: Gastroenterology;  Laterality: N/A;   LEFT HEART CATH AND CORS/GRAFTS ANGIOGRAPHY N/A 10/14/2019   Procedure: LEFT HEART CATH AND CORS/GRAFTS ANGIOGRAPHY;  Surgeon: Claudene Victory ORN, MD;  Location: [facility] INVASIVE CV LAB;  Service: Cardiovascular;  Laterality: N/A;   LEFT HEART CATH AND CORS/GRAFTS ANGIOGRAPHY N/A 11/11/2021   Procedure: LEFT HEART CATH AND CORS/GRAFTS ANGIOGRAPHY;  Surgeon: Mady Bruckner, MD;  Location: [facility] INVASIVE CV LAB;  Service: Cardiovascular;  Laterality: N/A;   LEFT HEART CATH AND CORS/GRAFTS ANGIOGRAPHY N/A 10/10/2022   Procedure: LEFT HEART CATH AND CORS/GRAFTS  ANGIOGRAPHY;  Surgeon: [place], Peter M, MD;  Location: [facility] INVASIVE CV LAB;  Service: Cardiovascular;  Laterality: N/A;    Allergies  No Known Allergies  History of Present Illness    Billy Cisneros. has a PMH of coronary artery disease status post CABG in 2000 (LIMA to LAD, RIMA to RCA, SVG to OM1, diagonal and ramus) underwent cardiac catheterization 10/2019 which showed patent LIMA-LAD, patent RIMA-PDA, total occluded distal left main, totally occluded ostial circumflex, totally occluded ostial LAD and normal LVEDP.  Medical management was recommended.  His PMH also includes HTN, anxiety, gout, and GERD.  He was seen in follow-up by Dr. Maxcy on 09/23/2019.  During that time he reported difficulty with chest discomfort.  He described exertional type chest pain with left arm heaviness.  His symptoms improved with rest.  It was noted that his LDL cholesterol was 62 but his triglycerides remained elevated at 320.  He underwent cardiac catheterization by Dr. Claudene 6/21.  At that time his Imdur  was uptitrated.  He presented to the clinic 10/30/20 for follow-up evaluation stated he had not noticed a difference in his chest discomfort with the addition of metoprolol .  He did not start his Imdur  medication.  He reported that with increased physical activity he noticed chest discomfort.  He gave an example of taking out his trash.  When he would rest after increased activity his chest discomfort would ease off and go away.  We reviewed his angiography and he and his wife expressed understanding.  He reported that he generally was fairly sedentary.  He also reported that he did not eat  much fiber.  We reviewed his cholesterol panel and he reported that over the last several months he had not watched his diet as closely.  I had him start his Imdur , continue metoprolol , increase his physical activity, start fiber supplementation, and planned follow-up in 6 months.    He presented to the clinic 09/12/21 for  follow-up evaluation and stated he felt well.  He was unable to come to his last 2 appointments due to his daughter being in a car accident and then having flu.  We reviewed his current medication list and he is tolerating his medications well without side effects.  He did note some atypical chest discomfort at night when he laid down.  However, he denied exertional chest discomfort.  He had been walking on the treadmill daily and going to the [facility] 1 time per week.  He reported that he had been losing some weight.  I refilled his metoprolol , requested his updated lab work from his PCP and planned follow-up for 12 months.  He presented to the emergency department on 11/11/2021 with unstable angina.  He underwent left heart cath which showed severe coronary artery disease including subtotal chronic occlusion of the ostial LAD, proximal ramus, proximal circumflex and severe diffuse RCA disease.  He had a patent LIMA-LAD 30% stenosed, widely patent RIMA-RPDA, patent SVG-ramus, chronically occluded SVG-D.  Medical management was continued.  It was felt that running out of his isosorbide  mononitrate brought on his recurrent angina.  He was discharged in stable condition on 11/12/2021.  He presents to the clinic 11/22/21 for follow-up evaluation stated that he had been off of his Imdur  for about 5 days.  He did not have a new prescription of sublingual nitroglycerin .  He reported that he had nitro spray in the past but never used it and his prescription was old.  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  He occasionally lifted heavier materials in and out of his fleeta.  He did report a fall out of bed 11/21/21.  He reported he rolled the wrong way.  We reviewed the importance of heart healthy low-sodium diet.  We reviewed his angiography and he expressed understanding.  I refilled his sublingual nitroglycerin , continued his current medication regimen, and plan follow-up in 4 to 6 months.  He was admitted to the  hospital on 10/09/2022 and discharged on 10/11/2022.  He was noted to have NSTEMI.  His cardiac troponins trended up from 6-153, 441, and 461.  His EKG showed no acute ST changes.  He underwent cardiac catheterization on 10/10/2022.  He was not noted to have change from prior cardiac catheterization 7/23.  Medical management was continued.  His echocardiogram 10/10/2022 showed an EF of 55-60%, G1 DD, trivial mitral valve regurgitation.  He had presented with pain in his neck to [facility].  CT spine showed no acute fracture but did note worsening moderate spinal stenosis along C4-C5 and severe bilateral neuroforaminal stenosis at C3-C4.  Recommendation to follow-up with neurosurgery was recommended.  He presented to the clinic 06/02/23 for follow-up evaluation and stated he had been doing well.  He was working part-time.  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  He did note occasionally that when he lifted a heavy parts bin he would get chest discomfort.  His chest discomfort was relieved with rest and occasionally he needed to take a sublingual nitroglycerin .  We reviewed his spring cardiac cath and echocardiogram.  He expressed understanding.  He continued to have  cervical spine issues.  He had been prescribed physical therapy which he would be starting .   Follow-up in 6 to 9 months was planned.   He presents to the clinic today for follow-up evaluation and states recently has been noticing more instances chest pain.  These come on with activity and are relieved with rest.  He reports that when he is working and lifting heavy parts bends he occasionally has to take multiple doses of nitroglycerin  throughout the day.  He is limited in his physical activity due to his spinal issues.  He believes that some of his symptoms may be related to this as well.  He reports increased fatigue and decreased exercise tolerance.  He was seen by his PCP who recommended he come for sooner follow-up.  His recent lab work  was unremarkable.  I will order cardiac PET/CT and echocardiogram for further evaluation.  Will plan follow-up in 2 months.  Today he denies  shortness of breath, lower extremity edema,  palpitations, melena, hematuria, hemoptysis, diaphoresis, weakness, presyncope, syncope, orthopnea, and PND.     Home Medications    Prior to Admission medications   Medication Sig Start Date End Date Taking? Authorizing Provider  ALPRAZolam  (XANAX ) 0.5 MG tablet TAKE 1 TABLET BY MOUTH 3 TIMES A DAY AS NEEDED FOR ANXIETY. Patient taking differently: Take 0.5 mg by mouth 3 (three) times daily as needed for anxiety. TAKE 1 TABLET BY MOUTH 3 TIMES A DAY AS NEEDED FOR ANXIETY. 06/24/17   Maree Leni Edyth DELENA, MD  aspirin  EC 81 MG tablet Take 1 tablet (81 mg total) by mouth daily. For heart attack prevention. 11/22/11   Rochel Betters T, PA-C  buPROPion  (WELLBUTRIN  XL) 300 MG 24 hr tablet Take 1 tablet (300 mg total) by mouth daily. 06/24/17   Shah, Syed Asad A, MD  HYDROcodone -acetaminophen  (NORCO/VICODIN) 5-325 MG tablet Take 1 tablet by mouth every 6 (six) hours as needed for moderate pain.  12/29/17   [provider]  icosapent  Ethyl (VASCEPA ) 1 g capsule Take 2 capsules (2 g total) by mouth 2 (two) times daily. 09/23/19   Hilty, Billy BROCKS, MD  isosorbide  mononitrate (IMDUR ) 30 MG 24 hr tablet Take 1 tablet (30 mg total) by mouth daily. 10/14/19 10/13/20  Claudene Victory ORN, MD  lisinopril  (PRINIVIL ,ZESTRIL ) 10 MG tablet Take 1 tablet (10 mg total) by mouth daily. 06/24/17   Maree Leni Edyth DELENA, MD  metoprolol  succinate (TOPROL  XL) 25 MG 24 hr tablet Take 1 tablet (25 mg total) by mouth daily. 10/14/19 10/13/20  Claudene Victory ORN, MD  Multiple Vitamins-Minerals (MULTIVITAMIN WITH MINERALS) tablet Take 1 tablet by mouth daily.    [provider]  omeprazole  (PRILOSEC) 20 MG capsule Take 1 capsule (20 mg total) by mouth 2 (two) times daily before a meal. 06/24/17   Maree Leni Edyth DELENA, MD  simvastatin  (ZOCOR ) 40 MG tablet  Take 1 tablet (40 mg total) by mouth at bedtime. 06/24/17   Maree Leni Edyth DELENA, MD  valACYclovir  (VALTREX ) 500 MG tablet Take 500 mg by mouth 2 (two) times daily as needed (outbreak).     [provider]  vitamin B-12 (CYANOCOBALAMIN) 500 MCG tablet Take 500 mcg by mouth daily.    [provider]  zolpidem  (AMBIEN ) 10 MG tablet Take 1 tablet (10 mg total) by mouth at bedtime. Patient taking differently: Take 10 mg by mouth at bedtime as needed for sleep.  06/24/17   Maree Leni Edyth DELENA, MD  Family History    Family History  Problem Relation Age of Onset   Heart attack Mother    Hyperlipidemia Mother    Hypertension Mother    Cancer Father        lung   Diabetes Sister    Diabetes Brother    Asthma Daughter    Cancer Maternal Grandmother    Stroke Maternal Grandfather    Heart attack Paternal Grandfather    He indicated that his mother is deceased. He indicated that his father is deceased. He indicated that his sister is alive. He indicated that his brother is alive. He indicated that his maternal grandmother is deceased. He indicated that his maternal grandfather is deceased. He indicated that his paternal grandmother is deceased. He indicated that his paternal grandfather is deceased. He indicated that his daughter is alive.   Social History    Social History   Socioeconomic History   Marital status: Married    Spouse name: Not on file   Number of children: Not on file   Years of education: Not on file   Highest education level: Not on file  Occupational History   Not on file  Tobacco Use   Smoking status: Never   Smokeless tobacco: Never  Vaping Use   Vaping status: Never Used  Substance and Sexual Activity   Alcohol  use: Yes    Alcohol /week: 0.0 standard drinks of alcohol     Comment: 3 beer a month   Drug use: Yes    Frequency: 2.0 times per week    Types: Cocaine    Comment: last use 15-20 years per patient   Sexual activity: Yes    Birth  control/protection: None  Other Topics Concern   Not on file  Social History Narrative   Not on file   Social Drivers of Health   Financial Resource Strain: Low Risk  (08/11/2023)   Received from [facility]   Overall Financial Resource Strain (CARDIA)    Difficulty of Paying Living Expenses: Not hard at all  Food Insecurity: No Food Insecurity (08/11/2023)   Received from [facility]   Hunger Vital Sign    Within the past 12 months, you worried that your food would run out before you got the money to buy more.: Never true    Within the past 12 months, the food you bought just didn't last and you didn't have money to get more.: Never true  Transportation Needs: No Transportation Needs (08/11/2023)   Received from [facility] - Transportation    In the past 12 months, has lack of transportation kept you from medical appointments or from getting medications?: No    Lack of Transportation (Non-Medical): No  Physical Activity: Not on file  Stress: Not on file  Social Connections: Not on file  Intimate Partner Violence: Not on file     Review of Systems    General:  No chills, fever, night sweats or weight changes.  Cardiovascular:  No chest pain, dyspnea on exertion, edema, orthopnea, palpitations, paroxysmal nocturnal dyspnea. Dermatological: No rash, lesions/masses Respiratory: No cough, dyspnea Urologic: No hematuria, dysuria Abdominal:   No nausea, vomiting, diarrhea, bright red blood per rectum, melena, or hematemesis Neurologic:  No visual changes, wkns, changes in mental status. All other systems reviewed and are otherwise negative except as noted above.  Physical Exam    VS:  BP 108/68   Pulse 63   Ht 5' 5 (1.651  m)   Wt 181 lb (82.1 kg)   SpO2 95%   BMI 30.12 kg/m  , BMI Body mass index is 30.12 kg/m. GEN: Well nourished, well developed, in no acute distress. HEENT: normal. Neck: Supple, no JVD, carotid  bruits, or masses. Cardiac: RRR, no murmurs, rubs, or gallops. No clubbing, cyanosis, edema.  Radials/DP/PT 2+ and equal bilaterally.  Respiratory:  Respirations regular and unlabored, clear to auscultation bilaterally. GI: Soft, nontender, nondistended, BS + x 4. MS: no deformity or atrophy. Skin: warm and dry, no rash. Neuro:  Strength and sensation are intact. Psych: Normal affect.  Accessory Clinical Findings    Recent Labs: No results found for requested labs within last 365 days.   Recent Lipid Panel    Component Value Date/Time   CHOL 130 10/10/2022 0941   CHOL 146 01/08/2015 0925   CHOL 141 08/06/2011 0327   TRIG 141 10/10/2022 0941   TRIG 375 (H) 08/06/2011 0327   HDL 43 10/10/2022 0941   HDL 42 01/08/2015 0925   HDL 25 (L) 08/06/2011 0327   CHOLHDL 3.0 10/10/2022 0941   VLDL 28 10/10/2022 0941   VLDL 75 (H) 08/06/2011 0327   LDLCALC 59 10/10/2022 0941   LDLCALC 76 01/08/2015 0925   LDLCALC 41 08/06/2011 0327    ECG personally reviewed by me today EKG Interpretation Date/Time:  Thursday December 31 2023 10:26:09 EDT Ventricular Rate:  63 PR Interval:  170 QRS Duration:  78 QT Interval:  394 QTC Calculation: 403 R Axis:   47  Text Interpretation: Normal sinus rhythm Low voltage QRS When compared with ECG of 09-Oct-2022 17:07, No significant change was found Confirmed by Emelia Hazy [phone]) on 12/31/2023 10:40:32 AM   EKG 09/12/2021  normal sinus rhythm 80 bpm  Cardiac catheterization 10/14/2019 Patent saphenous vein graft to the ramus intermedius but occlusion occlusion of the saphenous vein graft limb to the obtuse marginal.   Patent LIMA to distal LAD Patent RIMA to PDA Totally occluded distal left main Totally occluded ostial circumflex Totally occluded ostial LAD Normal LV end-diastolic pressure.   RECOMMENDATIONS:   Toprol -XL 25 mg/day After 1 week if angina is still significant, add Imdur  30 mg/day. Further titrate anti-ischemic therapy until  relief of symptoms. No interventional targets.    Diagnostic Dominance: Right    Intervention   Cardiac Catheterization: 10/10/2022    Ost LM to Dist LM lesion is 50% stenosed.   Mid LAD lesion is 100% stenosed.   Ost LAD to Mid LAD lesion is 99% stenosed.   Ost Cx to Prox Cx lesion is 99% stenosed.   Origin lesion is 30% stenosed.   Origin to Insertion lesion between Ramus and 3rd Mrg  is 100% stenosed.   Origin to Insertion lesion is 100% stenosed.   3rd Mrg lesion is 100% stenosed.   LIMA and is small.   SVG and is normal in caliber.   SVG.   LV end diastolic pressure is normal.   Severe 3 vessel obstructive CAD Patent LIMA to the LAD Patent RIMA to the PDA Patent SVG to the ramus intermedius Normal LVEDP   Plan: no change compared to prior cardiac cath in July 2023. Continue medical management.  Assessment & Plan   1.  Coronary artery disease-notes he is fairly sedentary.  He notices chest pain with lifting heavy parts bends and carrying trash cans out to the curb.  He reports that this is becoming more frequent.  At times he is usually nitroglycerin  intermittently  throughout his days.  This coincides with him lifting heavier things.  He reports that his pain is relieved with the nitroglycerin .  Recent blood work reassuring.  He had cardiac catheterization 10/10/2022 in the setting of NSTEMI with elevated troponins.  No changes were noted from his previous 7/23 cardiac catheterization and medical management was recommended.  His cardiac catheterization 11/11/21 showed severe coronary artery disease including subtotal chronic occlusion of the ostial LAD, proximal ramus, proximal circumflex and severe diffuse RCA disease.  He had a patent LIMA-LAD 30% stenosed, widely patent RIMA-RPDA, patent SVG-ramus, chronically occluded SVG-D.  Continue aspirin , Imdur , lisinopril , metoprolol , omega-3 fatty acids, rosuvastatin , sublingual nitroglycerin  as needed Heart healthy low-sodium  diet-reviewed Ordered echocardiogram, cardiac PET CT  Hyperlipidemia-LDL 59 on 10/10/22.   Continue Lovaza , Crestor , omega-3 fatty acids Heart healthy low-sodium high-fiber diet Increase physical activity as tolerated Follow any PCP  Essential hypertension-BP today 108/68 Continue to maintain blood pressure log Decrease metoprolol  to 25 mg  Spinal stenosis-continues to note cervical pain.  During admission 5/24 - 6/24.  He reports neck discomfort with lifting.  Spine CT showed moderate spinal stenosis along C4-C5 and severe bilateral neuroforaminal stenosis at C3-C4. Following with neurosurgery Continue physical therapy exercises  Disposition: Follow-up with Dr. Mona or me in 2 months.  Josefa HERO. Kaytlynn Kochan NP-C    12/31/2023, 11:25 AM [facility] [address] Office [phone] Fax [phone]    I spent 14  minutes examining this patient, reviewing medications, and using patient centered shared decision making involving their cardiac care.  Prior to their visit I spent greater than 20 minutes reviewing  past medical history,  medications, and prior cardiac tests.

## 2023-12-31 NOTE — Patient Instructions (Addendum)
 Medication Instructions:  Your physician has recommended you make the following change in your medication:  DECREASE TOPROL  TO 25 MG DAILY.   *If you need a refill on your cardiac medications before your next appointment, please call your pharmacy*  Lab Work: None If you have labs (blood work) drawn today and your tests are completely normal, you will receive your results only by: MyChart Message (if you have MyChart) OR A paper copy in the mail If you have any lab test that is abnormal or we need to change your treatment, we will call you to review the results.  Testing/Procedures: CARDIAC PET CT SCAN  Your physician has requested that you have an echocardiogram. Echocardiography is a painless test that uses sound waves to create images of your heart. It provides your doctor with information about the size and shape of your heart and how well your heart's chambers and valves are working. This procedure takes approximately one hour. There are no restrictions for this procedure. Please do NOT wear cologne, perfume, aftershave, or lotions (deodorant is allowed). Please arrive 15 minutes prior to your appointment time.  Please note: We ask at that you not bring children with you during ultrasound (echo/ vascular) testing. Due to room size and safety concerns, children are not allowed in the ultrasound rooms during exams. Our front office staff cannot provide observation of children in our lobby area while testing is being conducted. An adult accompanying a patient to their appointment will only be allowed in the ultrasound room at the discretion of the ultrasound technician under special circumstances. We apologize for any inconvenience.   Follow-Up: At [facility], you and your health needs are our priority.  As part of our continuing mission to provide you with exceptional heart care, our providers are all part of one team.  This team includes your primary Cardiologist (physician) and  Advanced Practice Providers or APPs (Physician Assistants and Nurse Practitioners) who all work together to provide you with the care you need, when you need it.  Your next appointment:   2 MONTHS  Provider:   Vinie JAYSON Maxcy, MD or Josefa Beauvais, PA-C  We recommend signing up for the patient portal called MyChart.  Sign up information is provided on this After Visit Summary.  MyChart is used to connect with patients for Virtual Visits (Telemedicine).  Patients are able to view lab/test results, encounter notes, upcoming appointments, etc.  Non-urgent messages can be sent to your provider as well.   To learn more about what you can do with MyChart, go to [email or web address].         Please report to Radiology at the [facility] Main Entrance 30 minutes early for your test.  [address] [place], [place] [address]                         OR   Please report to Radiology at [facility] Main Entrance, medical mall, 30 mins prior to your test.  [address]  [place], [place]  How to Prepare for Your Cardiac PET/CT Stress Test:  Nothing to eat or drink, except water, 3 hours prior to arrival time.  NO caffeine/decaffeinated products, or chocolate 12 hours prior to arrival. (Please note decaffeinated beverages (teas/coffees) still contain caffeine).  If you have caffeine within 12 hours prior, the test will need to be rescheduled.  Medication instructions: Do not take erectile dysfunction medications for 72  hours prior to test (sildenafil, tadalafil) Do not take nitrates (isosorbide  mononitrate, Ranexa) the day before or day of test Do not take tamsulosin the day before or morning of test Hold theophylline containing medications for 12 hours. Hold Dipyridamole 48 hours prior to the test.  Diabetic Preparation: If able to eat breakfast prior to 3 hour fasting, you may take all medications, including your insulin. Do not worry if you miss  your breakfast dose of insulin - start at your next meal. If you do not eat prior to 3 hour fast-Hold all diabetes (oral and insulin) medications. Patients who wear a continuous glucose monitor MUST remove the device prior to scanning.  You may take your remaining medications with water.  NO perfume, cologne or lotion on chest or abdomen area.  Total time is 1 to 2 hours; you may want to bring reading material for the waiting time.  IF YOU THINK YOU MAY BE PREGNANT, OR ARE NURSING PLEASE INFORM THE TECHNOLOGIST.  In preparation for your appointment, medication and supplies will be purchased.  Appointment availability is limited, so if you need to cancel or reschedule, please call the Radiology Department Scheduler at [phone] 24 hours in advance to avoid a cancellation fee of $100.00  What to Expect When you Arrive:  Once you arrive and check in for your appointment, you will be taken to a preparation room within the Radiology Department.  A technologist or Nurse will obtain your medical history, verify that you are correctly prepped for the exam, and explain the procedure.  Afterwards, an IV will be started in your arm and electrodes will be placed on your skin for EKG monitoring during the stress portion of the exam. Then you will be escorted to the PET/CT scanner.  There, staff will get you positioned on the scanner and obtain a blood pressure and EKG.  During the exam, you will continue to be connected to the EKG and blood pressure machines.  A small, safe amount of a radioactive tracer will be injected in your IV to obtain a series of pictures of your heart along with an injection of a stress agent.    After your Exam:  It is recommended that you eat a meal and drink a caffeinated beverage to counter act any effects of the stress agent.  Drink plenty of fluids for the remainder of the day and urinate frequently for the first couple of hours after the exam.  Your doctor will inform you of  your test results within 7-10 business days.  For more information and frequently asked questions, please visit our website: [email or web address]  For questions about your test or how to prepare for your test, please call: Cardiac Imaging Nurse Navigators Office: [phone]

## 2023-12-31 NOTE — Addendum Note (Signed)
 Addended by: Avelynn Sellin on: 12/31/2023 02:25 PM   Modules accepted: Orders

## 2024-01-01 NOTE — Addendum Note (Signed)
 Addended by: EMELIA JOSEFA HERO on: 01/01/2024 06:49 AM   Modules accepted: Orders

## 2024-01-07 ENCOUNTER — Other Ambulatory Visit: Payer: Self-pay

## 2024-01-07 DIAGNOSIS — M4802 Spinal stenosis, cervical region: Secondary | ICD-10-CM

## 2024-01-07 DIAGNOSIS — G959 Disease of spinal cord, unspecified: Secondary | ICD-10-CM

## 2024-01-07 DIAGNOSIS — Z01818 Encounter for other preprocedural examination: Secondary | ICD-10-CM

## 2024-01-27 ENCOUNTER — Other Ambulatory Visit: Payer: Self-pay | Admitting: Internal Medicine

## 2024-01-27 DIAGNOSIS — R1032 Left lower quadrant pain: Secondary | ICD-10-CM

## 2024-02-02 ENCOUNTER — Ambulatory Visit (HOSPITAL_COMMUNITY)
Admission: RE | Admit: 2024-02-02 | Discharge: 2024-02-02 | Disposition: A | Discharge status: Home / Self Care | Source: Ambulatory Visit | Attending: Internal Medicine | Admitting: Internal Medicine

## 2024-02-02 DIAGNOSIS — R079 Chest pain, unspecified: Secondary | ICD-10-CM | POA: Insufficient documentation

## 2024-02-02 LAB — ECHOCARDIOGRAM COMPLETE
AR max vel: 3.31 cm2
AV Area VTI: 3.57 cm2
AV Area mean vel: 3.16 cm2
AV Mean grad: 2 mmHg
AV Peak grad: 4.3 mmHg
Ao pk vel: 1.04 m/s
S' Lateral: 2.56 cm

## 2024-02-03 ENCOUNTER — Ambulatory Visit: Payer: Self-pay | Admitting: General Practice

## 2024-02-09 NOTE — Progress Notes (Signed)
 History of Present Illness Billy Cisneros is a 73 year old male who presents for recheck and management of his conditions.  He is scheduled for a cervical laminectomy later this month due to cervical disc disease. His hip pain, previously relieved by an injection, has returned after missing a shot, sometimes becoming severe enough to impair walking. He experiences neck and back pain, which he believes are connected, and anticipates relief from the upcoming surgery.  No new chest pain or dyspnea. He can take out the trash and walk on a treadmill without experiencing breathlessness or chest pain. He monitors his blood pressure at home, which remains stable.  His kidney function has improved, with recent labs showing decreased creatinine levels. He is mindful of his diet, noting a drop in cholesterol levels, though triglycerides remain high. He is working on maintaining a low-fat diet.  No recent gout flares, and uric acid levels are well controlled. He is doing well on his current regimen for depression and anxiety, maintaining good activity and sleep patterns.  He experiences impaired ambulation due to cervical and lumbar disc disease. He finds the cane cumbersome and is awaiting insurance approval for a rolling walker.  He plans to defer his flu shot until after his upcoming surgery.     Current Outpatient Medications  Medication Sig Dispense Refill  . allopurinoL (ZYLOPRIM) 100 MG tablet TAKE ONE TABLET BY MOUTH ONE TIME DAILY 90 tablet 3  . ALPRAZolam  (XANAX ) 0.5 MG tablet TAKE ONE TABLET BY MOUTH THREE TIMES A DAY AS NEEDED FOR ANXIETY 90 tablet 2  . ARIPiprazole (ABILIFY) 2 MG tablet Take 1 tablet (2 mg total) by mouth at bedtime 30 tablet 11  . aspirin  81 MG EC tablet Take by mouth.    . buPROPion  (WELLBUTRIN  XL) 300 MG XL tablet TAKE 1 TABLET BY MOUTH ONCE  DAILY 100 tablet 2  . colchicine  (COLCRYS ) 0.6 mg tablet TAKE ONE TABLET BY MOUTH ONE TIME DAILY 30 tablet 2  . isosorbide   mononitrate (IMDUR ) 60 MG ER tablet Take 60 mg by mouth once daily    . levothyroxine (SYNTHROID) 25 MCG tablet Take 1 tablet (25 mcg total) by mouth once daily Take on an empty stomach with a glass of water at least 30-60 minutes before breakfast. 30 tablet 11  . lisinopriL  (ZESTRIL ) 10 MG tablet TAKE 1 TABLET BY MOUTH ONCE  DAILY 100 tablet 2  . methocarbamoL  (ROBAXIN ) 500 MG tablet 1/2-1 po qHS prn 30 tablet 0  . metoprolol  SUCCinate (TOPROL -XL) 50 MG XL tablet Take 50 mg by mouth once daily    . multivitamin with minerals tablet Take by mouth    . omega-3 acid ethyl esters (LOVAZA ) 1 gram capsule Take 1 capsule by mouth 2 (two) times daily    . omeprazole  (PRILOSEC) 20 MG DR capsule TAKE 1 CAPSULE BY MOUTH TWICE  DAILY 200 capsule 2  . rosuvastatin  (CRESTOR ) 20 MG tablet TAKE 1 TABLET BY MOUTH ONCE  DAILY 100 tablet 2  . valACYclovir  (VALTREX ) 500 MG tablet TAKE 1 TABLET(500 MG) BY MOUTH TWICE DAILY AS NEEDED FOR FLARE 30 tablet 5  . zolpidem  (AMBIEN ) 5 MG tablet TAKE ONE TABLET BY MOUTH ONE TIME DAILY AT BEDTIME AS NEEDED FOR SLEEP 30 tablet 5   No current facility-administered medications for this visit.    Allergies as of 02/10/2024  . (No Known Allergies)    Past Medical History:  Diagnosis Date  . Anxiety disorder, unspecified   . Coronary artery  disease    status post CABG x5, with LIMA to LAD, RIMA to RCA, SVG to OM1, D1, and ramus intermedius branch, November, 2000. Cardiac catheterization 05/02/2010 revealed patent bypass grafts.  . Depression, major, recurrent ()   . Essential hypertension   . GERD without esophagitis   . Gout   . Hyperlipidemia, mild     Past Surgical History:  Procedure Laterality Date  . CORONARY ARTERY BYPASS W/VENOUS & ARTERIAL GRAFTS     status post CABG x5, with LIMA to LAD, RIMA to RCA, SVG to OM1, D1, and ramus intermedius branch, November, 2000. Cardiac catheterization 05/02/2010 revealed patent bypass grafts.    Family History  Problem  Relation Name Age of Onset  . Coronary Artery Disease (Blocked arteries around heart) Mother    . Hyperlipidemia (Elevated cholesterol) Mother    . High blood pressure (Hypertension) Mother    . Arthritis Mother    . Diabetes Sister    . Diabetes Brother    . Gout Maternal Grandfather    . Arthritis Maternal Aunt      Social History   Socioeconomic History  . Marital status: Married  . Number of children: 1  Occupational History  . Occupation: retired  Tobacco Use  . Smoking status: Never  . Smokeless tobacco: Never  Vaping Use  . Vaping status: Never Used  Substance and Sexual Activity  . Alcohol  use: Yes    Alcohol /week: 0.0 standard drinks of alcohol     Comment: occasionally   Social Drivers of Health   Financial Resource Strain: Low Risk  (08/11/2023)   Overall Financial Resource Strain (CARDIA)   . Difficulty of Paying Living Expenses: Not hard at all  Food Insecurity: No Food Insecurity (08/11/2023)   Hunger Vital Sign   . Worried About Programme researcher, broadcasting/film/video in the Last Year: Never true   . Ran Out of Food in the Last Year: Never true  Transportation Needs: No Transportation Needs (08/11/2023)   PRAPARE - Transportation   . Lack of Transportation (Medical): No   . Lack of Transportation (Non-Medical): No     Goals Addressed   None        BP 118/60   Pulse 72   Wt 81.4 kg (179 lb 6.4 oz)   SpO2 96%   BMI 29.85 kg/m   General. Well-developed, well-nourished patient, in no distress   Chest: Postoperative changes Lungs. Clear to auscultation bilaterally without wheeze or retractions. Cardiovascular.Regular rate and rhythm without murmurs, gallops, or rubs.   Abdomen. Soft; non tender; non distended; no masses or organomegaly.  No guard or rebound Extremities.No clubbing, cyanosis, or edema.   Neurologic.  Slightly wide-based with antalgic gait   Essential hypertension  (primary encounter diagnosis) Coronary artery disease of native artery of native heart  with stable angina pectoris Hyperlipidemia, mild Idiopathic chronic gout of multiple sites without tophus Acquired hypothyroidism Moderate episode of recurrent major depressive disorder (CMS-HCC) Stage 3b chronic kidney disease (CMS-HCC) Cervical disc disease  Assessment & Plan Cervical disc disorder with myelopathy and lumbar disc disorder with radiculopathy Cervical and lumbar disc disorders causing bilateral hip pain and impaired ambulation. Scheduled for cervical laminectomy to address cervical spinal stenosis. Surgery aims to decompress nerve roots, reduce pain, and improve ambulation. Risks include potential for no improvement or worsening of symptoms, but current health status is favorable for surgery. - Proceed with cervical laminectomy as scheduled. - Document medical necessity for a rolling walker due to impaired ambulation. - Initiate physical  therapy post-surgery to address cervical and lumbar issues.  Atherosclerotic heart disease of native coronary artery with prior bypass and stable angina Atherosclerotic heart disease with prior bypass surgery and stable angina. No new chest pain or dyspnea. Functional capacity is adequate for surgery, indicating low cardiopulmonary risk.  Continue current treatment.  Follow liver, Menz, blood counts  Chronic kidney disease, stage 3b Chronic kidney disease stage 3b with improved and stable kidney function, adequate for surgery. - Avoid NSAIDs. - Continue monitoring kidney function with metabolic panel and urinalysis.  Avoid anti-inflammatories  Essential hypertension Blood pressure is well controlled on current regimen.  Limit sodium.  Follow metabolic panel, urinalysis and microalbumin  Hyperlipidemia with hypertriglyceridemia Hyperlipidemia with elevated triglycerides. Cholesterol levels are reasonably controlled, but triglycerides are high, likely due to dietary intake. Emphasis on dietary management is necessary, especially pre- and  post-surgery. - Advise low-fat diet to manage triglyceride levels. - Monitor lipid profile regularly.  Follow liver and thyroid   Idiopathic chronic gout, multiple sites, without tophus Idiopathic chronic gout is well controlled with no recent flares. Uric acid levels are stable on current medication regimen.  Good hydration  Hypothyroidism Hypothyroidism is well managed with current medication. Thyroid  function tests are within normal limits.  Depression with anxiety Depression with anxiety is stable on current medication regimen. Encouraged to maintain good activity levels and sleep hygiene.  General Health Maintenance Flu vaccination is deferred until after surgery to avoid potential complications with surgery scheduling. Regular follow-up is planned for comprehensive health maintenance. - Administer flu vaccine post-surgery. - Schedule annual exam in six months.   BERT JACK KLEIN III, MD  Portions of this note were created using dictation software and may contain typographical errors.    This note has been created using automated tools and reviewed for accuracy by BERT JACK KLEIN III.  Use of artificial intelligence tool (ABRIDGE) to help with documentation was discussed with patient.

## 2024-02-10 ENCOUNTER — Encounter (HOSPITAL_COMMUNITY): Payer: Self-pay

## 2024-02-11 ENCOUNTER — Ambulatory Visit
Admission: RE | Admit: 2024-02-11 | Discharge: 2024-02-11 | Disposition: A | Discharge status: Home / Self Care | Source: Ambulatory Visit | Attending: General Practice | Admitting: General Practice

## 2024-02-11 DIAGNOSIS — I700 Atherosclerosis of aorta: Secondary | ICD-10-CM | POA: Diagnosis not present

## 2024-02-11 DIAGNOSIS — R079 Chest pain, unspecified: Secondary | ICD-10-CM | POA: Insufficient documentation

## 2024-02-11 DIAGNOSIS — Z951 Presence of aortocoronary bypass graft: Secondary | ICD-10-CM | POA: Insufficient documentation

## 2024-02-11 DIAGNOSIS — K449 Diaphragmatic hernia without obstruction or gangrene: Secondary | ICD-10-CM | POA: Diagnosis not present

## 2024-02-11 MED ORDER — REGADENOSON 0.4 MG/5ML IV SOLN
0.4000 mg | Freq: Once | INTRAVENOUS | Status: AC
  Administered 2024-02-11: 0.4 mg via INTRAVENOUS
  Filled 2024-02-11: qty 5

## 2024-02-11 MED ORDER — REGADENOSON 0.4 MG/5ML IV SOLN
INTRAVENOUS | Status: AC
  Filled 2024-02-11: qty 5

## 2024-02-11 MED ORDER — RUBIDIUM RB82 GENERATOR (RUBYFILL)
25.0000 | PACK | Freq: Once | INTRAVENOUS | Status: AC
Start: 2024-02-11 — End: 2024-02-11
  Administered 2024-02-11: 21.28 via INTRAVENOUS

## 2024-02-11 MED ORDER — RUBIDIUM RB82 GENERATOR (RUBYFILL)
25.0000 | PACK | Freq: Once | INTRAVENOUS | Status: AC
  Administered 2024-02-11: 21.27 via INTRAVENOUS

## 2024-02-12 LAB — NM PET CT CARDIAC PERFUSION MULTI W/ABSOLUTE BLOODFLOW
LV dias vol: 65 mL (ref 62–150)
LV sys vol: 21 mL (ref 4.2–5.8)
Nuc Rest EF: 67 %
Nuc Stress EF: 68 %
Peak HR: 68 {beats}/min
Rest HR: 67 {beats}/min
Rest Nuclear Isotope Dose: 21.3 mCi
SRS: 4
SSS: 6
Stress Nuclear Isotope Dose: 21.3 mCi
TID: 1.11

## 2024-02-15 ENCOUNTER — Ambulatory Visit: Attending: Internal Medicine | Admitting: Internal Medicine

## 2024-02-15 ENCOUNTER — Encounter: Payer: Self-pay | Admitting: Internal Medicine

## 2024-02-15 VITALS — BP 113/71 | HR 65 | Resp 16 | Ht 65.0 in | Wt 179.8 lb

## 2024-02-15 DIAGNOSIS — Z01810 Encounter for preprocedural cardiovascular examination: Secondary | ICD-10-CM

## 2024-02-15 DIAGNOSIS — M4800 Spinal stenosis, site unspecified: Secondary | ICD-10-CM

## 2024-02-15 DIAGNOSIS — I10 Essential (primary) hypertension: Secondary | ICD-10-CM

## 2024-02-15 DIAGNOSIS — I2510 Atherosclerotic heart disease of native coronary artery without angina pectoris: Secondary | ICD-10-CM

## 2024-02-15 DIAGNOSIS — E785 Hyperlipidemia, unspecified: Secondary | ICD-10-CM

## 2024-02-15 NOTE — Patient Instructions (Signed)
 Medication Instructions:  NO CHANGES  *If you need a refill on your cardiac medications before your next appointment, please call your pharmacy*   Follow-Up: At [facility], you and your health needs are our priority.  As part of our continuing mission to provide you with exceptional heart care, our providers are all part of one team.  This team includes your primary Cardiologist (physician) and Advanced Practice Providers or APPs (Physician Assistants and Nurse Practitioners) who all work together to provide you with the care you need, when you need it.  Your next appointment:    3-4 months with Josefa Beauvais NP  We recommend signing up for the patient portal called MyChart.  Sign up information is provided on this After Visit Summary.  MyChart is used to connect with patients for Virtual Visits (Telemedicine).  Patients are able to view lab/test results, encounter notes, upcoming appointments, etc.  Non-urgent messages can be sent to your provider as well.   To learn more about what you can do with MyChart, go to [email or web address].   Other Instructions

## 2024-02-15 NOTE — Progress Notes (Unsigned)
 OFFICE NOTE  Chief Complaint:  Routine follow-up  Primary Care Physician: [name], MD  HPI:  Billy Cisneros. is a 73 y.o. male with a past medial history significant for coronary artery disease, formerly followed by Dr. Ammon in [place], with a history of 5 vessel CABG in 2000 (LIMA to LAD, RIMA to RCA, SVG to OM1, diagonal and ramus), patent grafts by catheterization 2011, who presented in April 2019 with chest pain.  He was last seen in the office in 2016.  He had a stress test in 2014 which showed no reversible ischemia at [facility].  He also has a history of hypertension, anxiety, gout and GERD.  He presented with left-sided chest pain that was exertional over the past 3 to 4 days.  He has been working mowing lawns.  He noted pain that radiated to his neck and down his left arm.  Blood pressure was initially elevated in the emergency department.  Creatinine is elevated 1.69.  Troponin was negative overnight x4.  Lipid profile showed total cholesterol 173, HDL 39, LDL 94 and triglycerides 201.  Chest x-ray showed no acute cardiopulmonary disease.  EKG shows normal sinus rhythm without ischemia, personally reviewed.  He underwent nuclear stress testing which showed no reversible ischemia and LVEF of 68%.  This was considered a low risk study.  My concern was for cervical radiculopathy causing his symptoms.  Subsequently he unfortunately had a fall off of a ladder was seen in the [place] [facility].  They did do cervical C-spine films as well as an MRI which showed multilevel disc disease some disc protrusion and spondylolysis.  These were considered pre-existing findings however could likely explain his symptoms.  Subsequently was referred to a physiatrist and has been undergoing injections of the neck, but has not noted much improvement.  He denies any further chest pain or worsening shortness of breath.  09/23/2019  Billy Cisneros returns for follow-up.  Its  been sometime since he was here.  More recently though he has been having some difficulty with discomfort in his chest.  He says this is exertional and improves at rest at times has been complicated by some left arm heaviness.  Symptoms are obviously a little concerning for coronary disease.  He is more than 20 years out from his bypass grafting and had had patent grafts in 2011 and a stress test in 2019 with no ischemia.  Recent labs were reasonably well-controlled with an LDL 62 however his triglycerides remain very elevated at 320.  This is a possible persistent cardiovascular risk factor and I suspect will need treatment.  10/03/2022  Billy Cisneros returns today for follow-up.  I saw him last year and he was actually referred for cardiac catheterization for ongoing chest pain symptoms.  Repeat cath showed essentially stable coronary anatomy.  He had run out of his isosorbide  and he was suspicious that that was the cause of his chest pain.  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  As part of that he has to lift 70 pound batteries and he says he gets chest pain when he does this.  He often needs to take nitroglycerin  to resolve it.  Unfortunately restricting that would not allow him to do his job and there is no one that can assist him with it.  He essentially can  lift the batteries up onto a cart but then is able to use the cart to carry them.  Repeat EKG today is normal.  PMHx:  Past Medical History:  Diagnosis Date   Anxiety    Coronary artery disease    Depression    GERD (gastroesophageal reflux disease)    Gout    Hypertension    NSTEMI (non-ST elevated myocardial infarction) (HCC) 10/09/2022   Unstable angina (HCC) 11/11/2021    Past Surgical History:  Procedure Laterality Date   CARDIAC CATHETERIZATION     CARDIAC SURGERY     COLONOSCOPY WITH PROPOFOL  N/A 05/25/2017    Procedure: COLONOSCOPY WITH PROPOFOL ;  Surgeon: Therisa Bi, MD;  Location[facility] ENDOSCOPY;  Service: Gastroenterology;  Laterality: N/A;   CORONARY ARTERY BYPASS GRAFT     ESOPHAGOGASTRODUODENOSCOPY N/A 10/27/2017   Procedure: ESOPHAGOGASTRODUODENOSCOPY (EGD);  Surgeon: Unk Corinn Skiff, MD;  Location: [facility] ENDOSCOPY;  Service: Gastroenterology;  Laterality: N/A;   ESOPHAGOGASTRODUODENOSCOPY (EGD) WITH PROPOFOL  N/A 11/19/2017   Procedure: ESOPHAGOGASTRODUODENOSCOPY (EGD) WITH PROPOFOL ;  Surgeon: Unk Corinn Skiff, MD;  Location: [facility] ENDOSCOPY;  Service: Gastroenterology;  Laterality: N/A;   LEFT HEART CATH AND CORS/GRAFTS ANGIOGRAPHY N/A 10/14/2019   Procedure: LEFT HEART CATH AND CORS/GRAFTS ANGIOGRAPHY;  Surgeon: Claudene Victory ORN, MD;  Location: [facility] INVASIVE CV LAB;  Service: Cardiovascular;  Laterality: N/A;   LEFT HEART CATH AND CORS/GRAFTS ANGIOGRAPHY N/A 11/11/2021   Procedure: LEFT HEART CATH AND CORS/GRAFTS ANGIOGRAPHY;  Surgeon: Mady Bruckner, MD;  Location: [facility] INVASIVE CV LAB;  Service: Cardiovascular;  Laterality: N/A;   LEFT HEART CATH AND CORS/GRAFTS ANGIOGRAPHY N/A 10/10/2022   Procedure: LEFT HEART CATH AND CORS/GRAFTS ANGIOGRAPHY;  Surgeon: Swazil[place], Peter M, MD;  Location[facility] INVASIVE CV LAB;  Service: Cardiovascular;  Laterality: N/A;    FAMHx:  Family History  Problem Relation Age of Onset   Heart attack Mother    Hyperlipidemia Mother    Hypertension Mother    Cancer Father        lung   Diabetes Sister    Diabetes Brother    Asthma Daughter    Cancer Maternal Grandmother    Stroke Maternal Grandfather    Heart attack Paternal Grandfather     SOCHx:   reports that he has never smoked. He has never used smokeless tobacco. He reports current alcohol  use. He reports current drug use. Frequency: 2.00 times per week. Drug: Cocaine.  ALLERGIES:  No Known Allergies  ROS: Pertinent items noted in HPI and remainder of comprehensive ROS otherwise negative.  HOME  MEDS: Current Outpatient Medications on File Prior to Visit  Medication Sig Dispense Refill   allopurinol (ZYLOPRIM) 100 MG tablet Take 100 mg by mouth daily.     ALPRAZolam  (XANAX ) 0.5 MG tablet TAKE 1 TABLET BY MOUTH 3 TIMES A DAY AS NEEDED FOR ANXIETY. 90 tablet 2   ARIPiprazole (ABILIFY) 2 MG tablet Take 2 mg by mouth at bedtime.     aspirin  EC 81 MG tablet Take 1 tablet (81 mg total) by mouth daily. For heart attack prevention.     buPROPion  (WELLBUTRIN  XL) 300 MG 24 hr tablet Take 1 tablet (300 mg total) by mouth daily. 90 tablet 0   colchicine  0.6 MG tablet Take 0.6 mg by mouth daily as needed (gout).     isosorbide  mononitrate (IMDUR ) 60 MG 24 hr tablet Take 1 tablet (60 mg total) by mouth daily. 90 tablet 1   levothyroxine (SYNTHROID) 25 MCG tablet Take 25 mcg by mouth daily  before breakfast.     lisinopril  (PRINIVIL ,ZESTRIL ) 10 MG tablet Take 1 tablet (10 mg total) by mouth daily. 90 tablet 0   metoprolol  succinate (TOPROL  XL) 25 MG 24 hr tablet Take 1 tablet (25 mg total) by mouth daily. 90 tablet 3   Multiple Vitamins-Minerals (MULTIVITAMIN WITH MINERALS) tablet Take 1 tablet by mouth at bedtime.     nitroGLYCERIN  (NITROSTAT ) 0.4 MG SL tablet Place 1 tablet (0.4 mg total) under the tongue every 5 (five) minutes as needed for chest pain. 25 tablet 5   omega-3 acid ethyl esters (LOVAZA ) 1 g capsule TAKE ONE CAPSULE BY MOUTH TWICE A DAY 90 capsule 1   omeprazole  (PRILOSEC) 20 MG capsule Take 1 capsule (20 mg total) by mouth 2 (two) times daily before a meal. 180 capsule 0   predniSONE  (DELTASONE ) 10 MG tablet Take 10 mg by mouth daily as needed (gout flare).     rosuvastatin  (CRESTOR ) 20 MG tablet Take 1 tablet (20 mg total) by mouth at bedtime. 90 tablet 3   valACYclovir  (VALTREX ) 500 MG tablet Take 500 mg by mouth 2 (two) times daily as needed (outbreak).      zolpidem  (AMBIEN ) 5 MG tablet Take 5 mg by mouth at bedtime as needed.     No current facility-administered medications on  file prior to visit.    LABS/IMAGING: No results found for this or any previous visit (from the past 48 hours). No results found.  LIPID PANEL:    Component Value Date/Time   CHOL 130 10/10/2022 0941   CHOL 146 01/08/2015 0925   CHOL 141 08/06/2011 0327   TRIG 141 10/10/2022 0941   TRIG 375 (H) 08/06/2011 0327   HDL 43 10/10/2022 0941   HDL 42 01/08/2015 0925   HDL 25 (L) 08/06/2011 0327   CHOLHDL 3.0 10/10/2022 0941   VLDL 28 10/10/2022 0941   VLDL 75 (H) 08/06/2011 0327   LDLCALC 59 10/10/2022 0941   LDLCALC 76 01/08/2015 0925   LDLCALC 41 08/06/2011 0327     WEIGHTS: Wt Readings from Last 3 Encounters:  02/15/24 179 lb 12.8 oz (81.6 kg)  12/31/23 181 lb (82.1 kg)  12/17/23 179 lb 6 oz (81.4 kg)    VITALS: BP 113/71 (BP Location: Left Arm, Patient Position: Sitting)   Pulse 65   Resp 16   Ht 5' 5 (1.651 m)   Wt 179 lb 12.8 oz (81.6 kg)   SpO2 95%   BMI 29.92 kg/m   EXAM: General appearance: alert and no distress Neck: no carotid bruit, no JVD and thyroid  not enlarged, symmetric, no tenderness/mass/nodules Lungs: clear to auscultation bilaterally Heart: regular rate and rhythm, S1, S2 normal, no murmur, click, rub or gallop Abdomen: soft, non-tender; bowel sounds normal; no masses,  no organomegaly Extremities: extremities normal, atraumatic, no cyanosis or edema Pulses: 2+ and symmetric Skin: Skin color, texture, turgor normal. No rashes or lesions Neurologic: Grossly normal Psych: Pleasant  EKG:  EKG Interpretation Date/Time:  Monday February 15 2024 13:38:35 EDT Ventricular Rate:  64 PR Interval:  176 QRS Duration:  80 QT Interval:  378 QTC Calculation: 389 R Axis:   45  Text Interpretation: Normal sinus rhythm Normal ECG When compared with ECG of 31-Dec-2023 10:26, No significant change was found Confirmed by Mona Kent [phone]) on 02/15/2024 1:54:28 PM    ASSESSMENT: Stable angina Coronary artery disease status post 5 vessel CABG in 2000  (LIMA to LAD, RIMA to RCA, SVG to OM1, diagonal and ramus) -unchanged  anatomy by cardiac catheterization (11/2021) Low risk Myoview  stress test in 08/2017 with LVEF 68% Cervical radiculopathy with anterior chest and left arm involvement Hypertension Dyslipidemia -elevated triglycerides CKD  PLAN: 1.   Billy Cisneros has been having chest pain which is likely stable angina.  He seems to be doing well on isosorbide  for most activities but when he has to lift heavy items like car batteries he gets chest pain.  This seems to resolve either with rest or nitroglycerin .  Will continue with his current medications.  He did have recent lipid testing showing total cholesterol 130, triglycerides 204, HDL 39 and LDL 50.  Creatinine is 1.6 which is stable.  Plan follow-up with me annually or sooner as necessary.  Vinie KYM Maxcy, MD, [facility], FACP  [place]  [facility]  Medical Director of the [facility]  [facility] Diplomate of the American Board of Clinical Lipidology Attending Cardiologist  Direct Dial: [phone]  Fax: [phone]  Website:  [email or web address]   Heraclio Seidman Talyn Dessert 02/15/2024, 1:54 PM

## 2024-02-16 ENCOUNTER — Ambulatory Visit
Admission: RE | Admit: 2024-02-16 | Discharge: 2024-02-16 | Disposition: A | Discharge status: Home / Self Care | Source: Ambulatory Visit | Attending: Internal Medicine | Admitting: Internal Medicine

## 2024-02-16 DIAGNOSIS — R1032 Left lower quadrant pain: Secondary | ICD-10-CM | POA: Insufficient documentation

## 2024-02-16 MED ORDER — IOHEXOL 300 MG/ML  SOLN
85.0000 mL | Freq: Once | INTRAMUSCULAR | Status: AC | PRN
  Administered 2024-02-16: 85 mL via INTRAVENOUS

## 2024-02-17 ENCOUNTER — Telehealth: Payer: Self-pay

## 2024-02-17 ENCOUNTER — Encounter
Admission: RE | Admit: 2024-02-17 | Discharge: 2024-02-17 | Disposition: A | Discharge status: Home / Self Care | Source: Ambulatory Visit | Attending: Neurosurgery | Admitting: Neurosurgery

## 2024-02-17 ENCOUNTER — Other Ambulatory Visit: Payer: Self-pay

## 2024-02-17 VITALS — BP 112/70 | HR 73 | Resp 12 | Ht 65.0 in | Wt 183.3 lb

## 2024-02-17 DIAGNOSIS — I10 Essential (primary) hypertension: Secondary | ICD-10-CM | POA: Diagnosis not present

## 2024-02-17 DIAGNOSIS — Z01812 Encounter for preprocedural laboratory examination: Secondary | ICD-10-CM | POA: Diagnosis present

## 2024-02-17 DIAGNOSIS — M5412 Radiculopathy, cervical region: Secondary | ICD-10-CM

## 2024-02-17 DIAGNOSIS — D72829 Elevated white blood cell count, unspecified: Secondary | ICD-10-CM | POA: Diagnosis not present

## 2024-02-17 DIAGNOSIS — Z01818 Encounter for other preprocedural examination: Secondary | ICD-10-CM

## 2024-02-17 HISTORY — DX: Hyperlipidemia, unspecified: E78.5

## 2024-02-17 HISTORY — DX: Hypothyroidism, unspecified: E03.9

## 2024-02-17 HISTORY — DX: Radiculopathy, cervical region: M54.12

## 2024-02-17 HISTORY — DX: Cocaine abuse, in remission: F14.11

## 2024-02-17 HISTORY — DX: Chronic kidney disease, stage 3 unspecified: N18.30

## 2024-02-17 LAB — CBC
HCT: 40 % (ref 39.0–52.0)
Hemoglobin: 13.7 g/dL (ref 13.0–17.0)
MCH: 30.9 pg (ref 26.0–34.0)
MCHC: 34.3 g/dL (ref 30.0–36.0)
MCV: 90.1 fL (ref 80.0–100.0)
Platelets: 213 K/uL (ref 150–400)
RBC: 4.44 MIL/uL (ref 4.22–5.81)
RDW: 13.2 % (ref 11.5–15.5)
WBC: 5.9 K/uL (ref 4.0–10.5)
nRBC: 0 % (ref 0.0–0.2)

## 2024-02-17 LAB — SURGICAL PCR SCREEN
MRSA, PCR: NEGATIVE
Staphylococcus aureus: NEGATIVE

## 2024-02-17 NOTE — Telephone Encounter (Signed)
 Per Dr Elliot request, I contacted Billy Cisneros to discuss moving his surgery from 02/24/24 to 02/22/24. He was agreeable to this plan. I have notified the OR, reps, and insurance of this change. His PAT appointment was completed today.

## 2024-02-17 NOTE — Patient Instructions (Addendum)
 Your procedure is scheduled on:02-24-24 Wednesday Report to the Registration Desk on the 1st floor of the [facility].Then proceed to the 2nd floor Surgery Desk To find out your arrival time, please call [phone] between 1PM - 3PM on:03-04-24 Tuesday If your arrival time is 6:00 am, do not arrive before that time as the [facility] entrance doors do not open until 6:00 am.  REMEMBER: Instructions that are not followed completely may result in serious medical risk, up to and including death; or upon the discretion of your surgeon and anesthesiologist your surgery may need to be rescheduled.  Do not eat food after midnight the night before surgery.  No gum chewing or hard candies.  You may however, drink CLEAR liquids up to 2 hours before you are scheduled to arrive for your surgery. Do not drink anything within 2 hours of your scheduled arrival time.  Clear liquids include: - water  - apple juice without pulp - gatorade (not RED colors) - black coffee or tea (Do NOT add milk or creamers to the coffee or tea) Do NOT drink anything that is not on this list.  One week prior to surgery:Stop NOW (02-17-24) Stop ANY OVER THE COUNTER supplements until after surgery (Multivitamin) You may continue your omega-3 acid ethyl esters (LOVAZA ) since this is a prescription  Continue taking all of your other prescription medications up until the day of surgery.  ON THE DAY OF SURGERY ONLY TAKE THESE MEDICATIONS WITH SIPS OF WATER: -buPROPion  (WELLBUTRIN  XL)  -levothyroxine (SYNTHROID)  -metoprolol  succinate (TOPROL  XL)  -omeprazole  (PRILOSEC)  -You may take ALPRAZolam  (XANAX ) if needed for anxiety  Continue your 81 mg Aspirin  up until the day prior to surgery-Do NOT take the morning of surgery  No Alcohol  for 24 hours before or after surgery.  No Smoking including e-cigarettes for 24 hours before surgery.  No chewable tobacco products for at least 6 hours before surgery.  No nicotine   patches on the day of surgery.  Do not use any recreational drugs for at least a week (preferably 2 weeks) before your surgery.  Please be advised that the combination of cocaine and anesthesia may have negative outcomes, up to and including death. If you test positive for cocaine, your surgery will be cancelled.  On the morning of surgery brush your teeth with toothpaste and water, you may rinse your mouth with mouthwash if you wish. Do not swallow any toothpaste or mouthwash.  Use CHG Soap as directed on instruction sheet.  Do not wear jewelry, make-up, hairpins, clips or nail polish.  For welded (permanent) jewelry: bracelets, anklets, waist bands, etc.  Please have this removed prior to surgery.  If it is not removed, there is a chance that hospital personnel will need to cut it off on the day of surgery.  Do not wear lotions, powders, or perfumes.   Do not shave body hair from the neck down 48 hours before surgery.  Contact lenses, hearing aids and dentures may not be worn into surgery.  Do not bring valuables to the hospital. [facility] is not responsible for any missing/lost belongings or valuables.   Notify your doctor if there is any change in your medical condition (cold, fever, infection).  Wear comfortable clothing (specific to your surgery type) to the hospital.  After surgery, you can help prevent lung complications by doing breathing exercises.  Take deep breaths and cough every 1-2 hours. Your doctor may order a device called an Incentive Spirometer to help  you take deep breaths. When coughing or sneezing, hold a pillow firmly against your incision with both hands. This is called "splinting." Doing this helps protect your incision. It also decreases belly discomfort.  If you are being admitted to the hospital overnight, leave your suitcase in the car. After surgery it may be brought to your room.  In case of increased patient census, it may be necessary for you,  the patient, to continue your postoperative care in the Same Day Surgery department.  If you are being discharged the day of surgery, you will not be allowed to drive home. You will need a responsible individual to drive you home and stay with you for 24 hours after surgery.   If you are taking public transportation, you will need to have a responsible individual with you.  Please call the Pre-admissions Testing Dept. at [phone] if you have any questions about these instructions.  Surgery Visitation Policy:  Patients having surgery or a procedure may have two visitors.  Children under the age of 5 must have an adult with them who is not the patient.  Inpatient Visitation:    Visiting hours are 7 a.m. to 8 p.m. Up to four visitors are allowed at one time in a patient room. The visitors may rotate out with other people during the day.  One visitor age 39 or older may stay with the patient overnight and must be in the room by 8 p.m.    Pre-operative 4 CHG Bath Instructions   You can play a key role in reducing the risk of infection after surgery. Your skin needs to be as free of germs as possible. You can reduce the number of germs on your skin by washing with CHG (chlorhexidine gluconate) soap before surgery. CHG is an antiseptic soap that kills germs and continues to kill germs even after washing.   DO NOT use if you have an allergy to chlorhexidine/CHG or antibacterial soaps. If your skin becomes reddened or irritated, stop using the CHG and notify one of our RNs at [phone].   Please shower with the CHG soap starting 4 days before surgery using the following schedule:     Please keep in mind the following:  DO NOT shave, including legs and underarms, starting the day of your first shower.   You may shave your face at any point before/day of surgery.  Place clean sheets on your bed the day you start using CHG soap. Use a clean washcloth (not used since being washed)  for each shower. DO NOT sleep with pets once you start using the CHG.   CHG Shower Instructions:  If you choose to wash your hair and private area, wash first with your normal shampoo/soap.  After you use shampoo/soap, rinse your hair and body thoroughly to remove shampoo/soap residue.  Turn the water OFF and apply about 3 tablespoons (45 ml) of CHG soap to a CLEAN washcloth.  Apply CHG soap ONLY FROM YOUR NECK DOWN TO YOUR TOES (washing for 3-5 minutes)  DO NOT use CHG soap on face, private areas, open wounds, or sores.  Pay special attention to the area where your surgery is being performed.  If you are having back surgery, having someone wash your back for you may be helpful. Wait 2 minutes after CHG soap is applied, then you may rinse off the CHG soap.  Pat dry with a clean towel  Put on clean clothes/pajamas   If you choose to wear lotion,  please use ONLY the CHG-compatible lotions on the back of this paper.     Additional instructions for the day of surgery: DO NOT APPLY any lotions, deodorants, cologne, or perfumes.   Put on clean/comfortable clothes.  Brush your teeth.  Ask your nurse before applying any prescription medications to the skin.      CHG Compatible Lotions   Aveeno Moisturizing lotion  Cetaphil Moisturizing Cream  Cetaphil Moisturizing Lotion  Clairol Herbal Essence Moisturizing Lotion, Dry Skin  Clairol Herbal Essence Moisturizing Lotion, Extra Dry Skin  Clairol Herbal Essence Moisturizing Lotion, Normal Skin  Curel Age Defying Therapeutic Moisturizing Lotion with Alpha Hydroxy  Curel Extreme Care Body Lotion  Curel Soothing Hands Moisturizing Hand Lotion  Curel Therapeutic Moisturizing Cream, Fragrance-Free  Curel Therapeutic Moisturizing Lotion, Fragrance-Free  Curel Therapeutic Moisturizing Lotion, Original Formula  Eucerin Daily Replenishing Lotion  Eucerin Dry Skin Therapy Plus Alpha Hydroxy Crme  Eucerin Dry Skin Therapy Plus Alpha Hydroxy  Lotion  Eucerin Original Crme  Eucerin Original Lotion  Eucerin Plus Crme Eucerin Plus Lotion  Eucerin TriLipid Replenishing Lotion  Keri Anti-Bacterial Hand Lotion  Keri Deep Conditioning Original Lotion Dry Skin Formula Softly Scented  Keri Deep Conditioning Original Lotion, Fragrance Free Sensitive Skin Formula  Keri Lotion Fast Absorbing Fragrance Free Sensitive Skin Formula  Keri Lotion Fast Absorbing Softly Scented Dry Skin Formula  Keri Original Lotion  Keri Skin Renewal Lotion Keri Silky Smooth Lotion  Keri Silky Smooth Sensitive Skin Lotion  Nivea Body Creamy Conditioning Oil  Nivea Body Extra Enriched Lotion  Nivea Body Original Lotion  Nivea Body Sheer Moisturizing Lotion Nivea Crme  Nivea Skin Firming Lotion  NutraDerm 30 Skin Lotion  NutraDerm Skin Lotion  NutraDerm Therapeutic Skin Cream  NutraDerm Therapeutic Skin Lotion  ProShield Protective Hand Cream  Provon moisturizing lotion  How to Use an Incentive Spirometer An incentive spirometer is a tool that measures how well you are filling your lungs with each breath. Learning to take long, deep breaths using this tool can help you keep your lungs clear and active. This may help to reverse or lessen your chance of developing breathing (pulmonary) problems, especially infection. You may be asked to use a spirometer: After a surgery. If you have a lung problem or a history of smoking. After a long period of time when you have been unable to move or be active. If the spirometer includes an indicator to show the highest number that you have reached, your health care provider or respiratory therapist will help you set a goal. Keep a log of your progress as told by your health care provider. What are the risks? Breathing too quickly may cause dizziness or cause you to pass out. Take your time so you do not get dizzy or light-headed. If you are in pain, you may need to take pain medicine before doing incentive spirometry. It  is harder to take a deep breath if you are having pain. How to use your incentive spirometer  Sit up on the edge of your bed or on a chair. Hold the incentive spirometer so that it is in an upright position. Before you use the spirometer, breathe out normally. Place the mouthpiece in your mouth. Make sure your lips are closed tightly around it. Breathe in slowly and as deeply as you can through your mouth, causing the piston or the ball to rise toward the top of the chamber. Hold your breath for 3-5 seconds, or for as long as possible. If the  spirometer includes a coach indicator, use this to guide you in breathing. Slow down your breathing if the indicator goes above the marked areas. Remove the mouthpiece from your mouth and breathe out normally. The piston or ball will return to the bottom of the chamber. Rest for a few seconds, then repeat the steps 10 or more times. Take your time and take a few normal breaths between deep breaths so that you do not get dizzy or light-headed. Do this every 1-2 hours when you are awake. If the spirometer includes a goal marker to show the highest number you have reached (best effort), use this as a goal to work toward during each repetition. After each set of 10 deep breaths, cough a few times. This will help to make sure that your lungs are clear. If you have an incision on your chest or abdomen from surgery, place a pillow or a rolled-up towel firmly against the incision when you cough. This can help to reduce pain while taking deep breaths and coughing. General tips When you are able to get out of bed: Walk around often. Continue to take deep breaths and cough in order to clear your lungs. Keep using the incentive spirometer until your health care provider says it is okay to stop using it. If you have been in the hospital, you may be told to keep using the spirometer at home. Contact a health care provider if: You are having difficulty using the  spirometer. You have trouble using the spirometer as often as instructed. Your pain medicine is not giving enough relief for you to use the spirometer as told. You have a fever. Get help right away if: You develop shortness of breath. You develop a cough with bloody mucus from the lungs. You have fluid or blood coming from an incision site after you cough. Summary An incentive spirometer is a tool that can help you learn to take long, deep breaths to keep your lungs clear and active. You may be asked to use a spirometer after a surgery, if you have a lung problem or a history of smoking, or if you have been inactive for a long period of time. Use your incentive spirometer as instructed every 1-2 hours while you are awake. If you have an incision on your chest or abdomen, place a pillow or a rolled-up towel firmly against your incision when you cough. This will help to reduce pain. Get help right away if you have shortness of breath, you cough up bloody mucus, or blood comes from your incision when you cough. This information is not intended to replace advice given to you by your health care provider. Make sure you discuss any questions you have with your health care provider. Document Revised: 03/06/2023 Document Reviewed: 03/06/2023 Elsevier Patient Education  2024 [employer].   [employer] Directory to address health-related social needs:  [email or web address]

## 2024-02-18 ENCOUNTER — Encounter: Payer: Self-pay | Admitting: Neurosurgery

## 2024-02-18 NOTE — Progress Notes (Signed)
 Perioperative / Anesthesia Services  Pre-Admission Testing Clinical Review / Pre-Operative Anesthesia Consult  Date: 02/18/24  PATIENT DEMOGRAPHICS: Name: Billy Cisneros. DOB: 11-06-1950 MRN:   [id number]  Note: Available PAT nursing documentation and vital signs have been reviewed. Clinical nursing staff has updated patient's PMH/PSHx, current medication list, and drug allergies/intolerances to ensure complete and comprehensive history available to assist care teams in MDM as it pertains to the aforementioned surgical procedure and anticipated anesthetic course. Extensive review of available clinical information personally performed. Nursing documentation reviewed. [place] PMH and PSHx updated with any diagnoses and/or procedures that I have knowledge of that may have been inadvertently omitted during his intake with the pre-admission testing department's nursing staff.  PLANNED SURGICAL PROCEDURE(S):   Case: [id number] Date/Time: 02/22/24 1324   Procedure: CERVICAL LAMINOPLASTY - C3-7 LAMINOPLASTY   Anesthesia type: General   Diagnosis:      Cervical myelopathy (HCC) [G95.9]     Cervical spinal stenosis [M48.02]   Pre-op diagnosis:      G95.9 Cervical myelopathy     M48.02 Cervical spinal stenosis   Location: [facility] OR ROOM 03 / [facility]   Surgeons: Clois Fret, MD        CLINICAL DISCUSSION: Billy Cisneros. is a 73 y.o. male who is submitted for pre-surgical anesthesia review and clearance prior to him undergoing the above procedure. Patient has never been a smoker in the past. Pertinent PMH includes: CAD (s/p CABG), NSTEMI, unstable angina, HTN, HLD, hypothyroidism, CKD-III, GERD (on daily PPI), hiatal hernia, cervical spinal stenosis with radiculopathy and myelopathy, thoracolumbar DDD, depression, anxiety (on BZO), insomnia (on hypnotic), remote cocaine abuse (last used 2015).  Patient is followed by cardiology Para, MD). He was last  seen in the cardiology clinic on 02/15/2024; notes reviewed. At the time of his clinic visit, patient doing well overall from a cardiovascular perspective. Patient denied any chest pain, shortness of breath, PND, orthopnea, palpitations, significant peripheral edema, weakness, fatigue, vertiginous symptoms, or presyncope/syncope. Patient with a past medical history significant for cardiovascular diagnoses. Documented physical exam was grossly benign, providing no evidence of acute exacerbation and/or decompensation of the patient's known cardiovascular conditions.  Most recent myocardial perfusion imaging study was performed on 09/05/2017 revealing a  normal left ventricular systolic function with a hyperdynamic LVEF of 68%.  There were no regional wall motion abnormalities.  No artifact or left ventricular cavity size enlargement appreciated on review of imaging. SPECT images demonstrated no evidence of stress-induced myocardial ischemia or arrhythmia; no scintigraphic evidence of scar.  Study determined to be normal and low risk.  Patient underwent diagnostic LEFT heart catheterization on 03/15/1999 that revealed severe multivessel CAD.  He was referred to CVTS for consideration of revascularization.  Patient ultimately underwent revascularization procedure on 03/18/1999 placing LIMA-LAD, RIMA-RCA, SVG-OM1, SVG-D1, and SVG-RI bypass grafts.  Since patient's revascularization, he has undergone multiple diagnostic LEFT heart catheterizations (03/29/2003, 03/12/2006, 01/12/2007, and 05/02/2010) all revealing known multivessel disease with patent bypass grafts x 5.  Aggressive secondary prevention through medical management was recommended.  Diagnostic LEFT heart catheterization performed on 10/14/2019 revealed an occlusion of the SVG-OM1 bypass graft.  Medical management was recommended.  Diagnostic LEFT heart catheterization performed on 11/11/2021 revealing known occlusion of the SVG-OM1, with additional  occlusion of the SVG-D1.  Remaining bypass grafts noted to be widely patent.  Interventional cardiology made the decision to defer further intervention opting for continued medical management.  Patient suffered an NSTEMI on 10/09/2022.  Troponin peaked at 461 ng/L.  Repeat diagnostic LEFT heart catheterization was performed revealing multivessel CAD: 50% ostial-distal LM, 100% mid LAD, 99% ostial-mid LAD, 99% ostial-proximal LCx, and 100% OM3.  Again SVG-OM1 and SVG-D1 bypass grafts were occluded.  LIMA-LAD, RIMA-PDA, and SVG-RI bypass grafts widely patent.  Overall, there were no considerable changes from prior catheterization in 11/2021.  Continued medical management was recommended.  Most recent TTE performed on 02/02/2024 revealed a normal left ventricular systolic function with an EF of 55-60%. There was no LVH.  There were no regional wall motion abnormalities. Left ventricular diastolic Doppler parameters were indeterminate. Right ventricular size and function normal with a TAPSE measuring 2.2 cm  (normal range >/= 1.6 cm).  TR jet was inadequate for assessing PA pressure.  There was trivial mitral valve regurgitation.  All transvalvular gradients were noted to be normal providing no evidence of hemodynamically significant valvular stenosis. Aorta normal in size with no evidence of ectasia or aneurysmal dilatation.  Blood pressure well controlled at 113/71 mmHg on currently prescribed nitrate (isosorbide  mononitrate), ACEi (lisinopril ), and beta-blocker (metoprolol  succinate) therapies.  In addition to his scheduled nitrate therapy, patient has a supply of short acting nitrates (NTG) to use on a as needed basis for recurrent anginal symptoms; denied recent use.  Patient is on rosuvastatin  for his HLD diagnosis and ASCVD prevention. Patient is not diabetic.  He does not have an OSAH diagnosis. Patient is able to complete all of his  ADL/IADLs without cardiovascular limitation.  Per the DASI, patient is  able to achieve at least 4 METS of physical activity without experiencing any significant degree of angina/anginal equivalent symptoms. No changes were made to his medication regimen during his visit with cardiology.  Patient scheduled to follow-up with outpatient cardiology in 3-4 months or sooner if needed.  [name]. is scheduled for an elective CERVICAL LAMINOPLASTY on 02/22/2024 with Dr. Reeves Daisy, MD. Given patient's past medical history significant for cardiovascular diagnoses, presurgical cardiac clearance was sought by the PAT team. Per cardiology, Mr. Ingwersen seems to be doing well without any worsening chest pain.  I think a lot of his symptoms may be related to his cervical spine disease and he is planning on having upcoming surgery for this.  From a cardiac standpoint he is cleared to proceed with that and if necessary he could hold aspirin  for the procedure.  In review of the patient's medication reconciliation, it is noted that he is on daily oral antithrombotic therapy. Given that patient's past medical history is significant for cardiovascular diagnoses, including but not limited to CAD, neurosurgery has cleared patient to continue his daily low dose ASA throughout his perioperative course.  Patient has been updated on these directives from his specialty care providers by the PAT team.  Patient denies previous perioperative complications with anesthesia in the past. In review his EMR, it is noted that patient underwent a general anesthetic course here at [facility] ([facility] in 11/2017 without documented complications.   MOST RECENT VITAL SIGNS:    02/17/2024    8:36 AM 02/15/2024    1:35 PM 02/11/2024   11:41 AM  Vitals with BMI  Height 5' 5 5' 5   Weight 183 lbs 5 oz 179 lbs 13 oz   BMI 30.5 29.92   Systolic 112 113 893  Diastolic 70 71 54  Pulse 73 65    PROVIDERS/SPECIALISTS: NOTE: Primary physician provider listed  below. Patient may have been  seen by APP or partner within same practice.   PROVIDER ROLE / SPECIALTY LAST [place]ERLEAN Clois Fret, MD Neurosurgery (Surgeon) 12/17/2023  [name], MD Primary Care Provider 02/10/2024  Mona Kent, MD Cardiology 02/15/2024   ALLERGIES: No Known Allergies  CURRENT HOME MEDICATIONS: No current facility-administered medications for this encounter.    allopurinol (ZYLOPRIM) 100 MG tablet   ALPRAZolam  (XANAX ) 0.5 MG tablet   ARIPiprazole (ABILIFY) 2 MG tablet   aspirin  EC 81 MG tablet   buPROPion  (WELLBUTRIN  XL) 300 MG 24 hr tablet   colchicine  0.6 MG tablet   isosorbide  mononitrate (IMDUR ) 60 MG 24 hr tablet   levothyroxine (SYNTHROID) 25 MCG tablet   lisinopril  (PRINIVIL ,ZESTRIL ) 10 MG tablet   metoprolol  succinate (TOPROL  XL) 25 MG 24 hr tablet   Multiple Vitamins-Minerals (MULTIVITAMIN WITH MINERALS) tablet   nitroGLYCERIN  (NITROSTAT ) 0.4 MG SL tablet   omega-3 acid ethyl esters (LOVAZA ) 1 g capsule   omeprazole  (PRILOSEC) 20 MG capsule   predniSONE  (DELTASONE ) 10 MG tablet   rosuvastatin  (CRESTOR ) 20 MG tablet   valACYclovir  (VALTREX ) 500 MG tablet   zolpidem  (AMBIEN ) 5 MG tablet   HISTORY: Past Medical History:  Diagnosis Date   Anxiety    a.) on BZO (alprazolam ) PRN   Cervical myelopathy (HCC)    CKD (chronic kidney disease) stage 3, GFR 30-59 ml/min (HCC)    Coronary artery disease 03/15/1999   a.) LHC 03/15/1999: multi-vessel CAD --> CVTS; b.) s/p 5v CABG 03/18/1999; c.) LHC 03/29/2003, 03/12/2006, 01/12/2007, 05/02/2010 - MV Dz with patent bypass grafts x 5; d.) LHC 10/14/2019: MV Dz with CTO OM1; e.) LHC 11/11/2021: MV Dz with CTO SVG-OM1 and SVG-D1; f.) NSTEMI 10/09/2022 --> LHC 10/10/2022: MV Dz with CTO OM1 and SVG D1 - med mgmt   Depression    GERD (gastroesophageal reflux disease)    Gout    History of cocaine abuse (HCC)    last used in 2015 per pt (as of 02-15-24)   Hyperlipidemia    Hypertension    Hypothyroidism     Insomnia    a.) on hypnotic (zolpidem ) PRN   Long-term use of aspirin  therapy    NSTEMI (non-ST elevated myocardial infarction) (HCC) 10/09/2022   S/P CABG x 5 03/18/1999   a.) LIMA-LAD, RIMA-RCA, SVG-OM1, SVG-D1, SVG-RI   Spinal stenosis of cervical region with radiculopathy    Unstable angina (HCC) 11/11/2021   Past Surgical History:  Procedure Laterality Date   COLONOSCOPY WITH PROPOFOL  N/A 05/25/2017   Procedure: COLONOSCOPY WITH PROPOFOL ;  Surgeon: Therisa Bi, MD;  Location: [facility] ENDOSCOPY;  Service: Gastroenterology;  Laterality: N/A;   CORONARY ARTERY BYPASS GRAFT N/A 03/18/1999   Procedure: CORONARY ARTERY BYPASS GRAFT; Location: [place]   ESOPHAGOGASTRODUODENOSCOPY N/A 10/27/2017   Procedure: ESOPHAGOGASTRODUODENOSCOPY (EGD);  Surgeon: Unk Corinn Skiff, MD;  Location: [facility] ENDOSCOPY;  Service: Gastroenterology;  Laterality: N/A;   ESOPHAGOGASTRODUODENOSCOPY (EGD) WITH PROPOFOL  N/A 11/19/2017   Procedure: ESOPHAGOGASTRODUODENOSCOPY (EGD) WITH PROPOFOL ;  Surgeon: Unk Corinn Skiff, MD;  Location: [facility] ENDOSCOPY;  Service: Gastroenterology;  Laterality: N/A;   LEFT HEART CATH AND CORONARY ANGIOGRAPHY Left 03/15/1999   Procedure: LEFT HEART CATH AND CORONARY ANGIOGRAPHY; Location: [facility]   LEFT HEART CATH AND CORS/GRAFTS ANGIOGRAPHY N/A 10/14/2019   Procedure: LEFT HEART CATH AND CORS/GRAFTS ANGIOGRAPHY;  Surgeon: Claudene Victory ORN, MD;  Location: [facility] INVASIVE CV LAB;  Service: Cardiovascular;  Laterality: N/A;   LEFT HEART CATH AND CORS/GRAFTS ANGIOGRAPHY N/A 11/11/2021   Procedure: LEFT HEART CATH AND  CORS/GRAFTS ANGIOGRAPHY;  Surgeon: Mady Bruckner, MD;  Location: [facility] INVASIVE CV LAB;  Service: Cardiovascular;  Laterality: N/A;   LEFT HEART CATH AND CORS/GRAFTS ANGIOGRAPHY N/A 10/10/2022   Procedure: LEFT HEART CATH AND CORS/GRAFTS ANGIOGRAPHY;  Surgeon: [place], Peter M, MD;  Location: [facility] INVASIVE CV LAB;  Service: Cardiovascular;  Laterality: N/A;   LEFT HEART CATH  AND CORS/GRAFTS ANGIOGRAPHY Left 03/29/2003   Procedure: LEFT HEART CATH AND CORS/GRAFTS ANGIOGRAPHY; Location: [facility]; Surgeon: Vinie Jude, MD   LEFT HEART CATH AND CORS/GRAFTS ANGIOGRAPHY Left 03/12/2006   Procedure: LEFT HEART CATH AND CORS/GRAFTS ANGIOGRAPHY; Location: [facility]; Surgeon: Wolm Rhyme, MD   LEFT HEART CATH AND CORS/GRAFTS ANGIOGRAPHY Left 01/12/2007   Procedure: LEFT HEART CATH AND CORS/GRAFTS ANGIOGRAPHY; Location: [facility]; Surgeon: Wolm Rhyme, MD   LEFT HEART CATH AND CORS/GRAFTS ANGIOGRAPHY Left 05/02/2010   Procedure: LEFT HEART CATH AND CORS/GRAFTS ANGIOGRAPHY; Location: [facility]; Surgeon: Marsa Dooms, MD   Family History  Problem Relation Age of Onset   Heart attack Mother    Hyperlipidemia Mother    Hypertension Mother    Lung cancer Father    Diabetes Sister    Diabetes Brother    Asthma Daughter    Cancer Maternal Grandmother    Stroke Maternal Grandfather    Heart attack Paternal Grandfather    Social History   Tobacco Use   Smoking status: Never   Smokeless tobacco: Never  Substance Use Topics   Alcohol  use: Yes    Comment: rare   LABS:  Hospital Outpatient Visit on 02/17/2024  Component Date Value Ref Range Status   MRSA, PCR 02/17/2024 NEGATIVE  NEGATIVE Final   Staphylococcus aureus 02/17/2024 NEGATIVE  NEGATIVE Final   Comment: (NOTE) The Xpert SA Assay (FDA approved for NASAL specimens in patients 10 years of age and older), is one component of a comprehensive surveillance program. It is not intended to diagnose infection nor to guide or monitor treatment. Performed at [facility], [address] [place], [place] [address]    ABO/RH(D) 02/17/2024 O POS   Final   Antibody Screen 02/17/2024 NEG   Final   Sample Expiration 02/17/2024 03/02/2024,2359   Final   Extend sample reason 02/17/2024    Final                   Value:NO TRANSFUSIONS OR PREGNANCY IN THE PAST 3 MONTHS Performed at [facility], [address] [place], [place] [address]    WBC 02/17/2024 5.9  4.0 - 10.5 K/uL Final   RBC 02/17/2024 4.44  4.22 - 5.81 MIL/uL Final   Hemoglobin 02/17/2024 13.7  13.0 - 17.0 g/dL Final   HCT 89/91/7974 40.0  39.0 - 52.0 % Final   MCV 02/17/2024 90.1  80.0 - 100.0 fL Final   MCH 02/17/2024 30.9  26.0 - 34.0 pg Final   MCHC 02/17/2024 34.3  30.0 - 36.0 g/dL Final   RDW 89/91/7974 13.2  11.5 - 15.5 % Final   Platelets 02/17/2024 213  150 - 400 K/uL Final   nRBC 02/17/2024 0.0  0.0 - 0.2 % Final   Performed at [facility], [address], [place], [place] [address]  Hospital Outpatient Visit on 02/11/2024  Component Date Value Ref Range Status   Rest Nuclear Isotope Dose 02/11/2024 21.3  mCi Final   Stress Nuclear Isotope Dose 02/11/2024 21.3  mCi Final   Rest HR 02/11/2024 67.0  bpm Final   Rest BP 02/11/2024 131/73  mmHg Final  Peak HR 02/11/2024 68  bpm Final   Peak BP 02/11/2024 106/54  mmHg Final   SSS 02/11/2024 6.0   Final   SRS 02/11/2024 4.0   Final   TID 02/11/2024 1.11   Final   Nuc Stress EF 02/11/2024 68  % Final   Nuc Rest EF 02/11/2024 67  % Final   LV sys vol 02/11/2024 21.0  4.2 - 5.8 mL Final   LV dias vol 02/11/2024 65.0  62 - 150 mL Final  Hospital Outpatient Visit on 02/02/2024  Component Date Value Ref Range Status   S' Lateral 02/02/2024 2.56  cm Final   AV Area VTI 02/02/2024 3.57  cm2 Final   AV Mean grad 02/02/2024 2.0  mmHg Final   AV Area mean vel 02/02/2024 3.16  cm2 Final   AR max vel 02/02/2024 3.31  cm2 Final   AV Peak grad 02/02/2024 4.3  mmHg Final   Ao pk vel 02/02/2024 1.04  m/s Final   Est EF 02/02/2024 55 - 60%   Final    ECG: Date: 02/15/2024  Time ECG obtained: 1338 PM Rate: 64 bpm Rhythm: normal sinus Axis (leads I and aVF): normal Intervals: PR 176 ms. QRS 80 ms. QTc 389 ms. ST segment and T wave changes: No evidence of acute T wave abnormalities or significant ST segment elevation or depression.  Evidence of a possible, age  undetermined, prior infarct:  No Comparison: Similar to previous tracing obtained on 12/31/2023   IMAGING / PROCEDURES: CT ABDOMEN PELVIS W CONTRAST performed on 02/16/2024 No acute inflammatory process identified within the abdomen or pelvis. Mild diffuse hepatic steatosis.  There is a 5 mm hypoattenuating focus in the left kidney lower pole, posteriorly, which is too small to adequately characterize but favored to represent a cyst. There is a small sliding hiatal hernia  Tiny diverticulum arising from the second part of duodenum. There are multiple diverticula throughout the colon, without imaging signs of diverticulitis. Tiny fat containing umbilical hernia  There are mild multilevel degenerative changes in the visualized spine. Aortic atherosclerosis   NM PET CT CARDIAC PERFUSION MULTI W/ABSOLUTE BLOODFLOW performed on 02/11/2024 Small type 1 hiatal hernia. Prior CABG. Aortic atherosclerosis LV perfusion is abnormal.  There is a medium defect with moderate to severe reduction in uptake present in the apical to mid anterior and anterolateral location(s) that is partially reversible. There is abnormal wall motion in the defect area. Consistent with infarction and peri-infarct ischemia. Rest left ventricular function is normal. Rest EF: 67%. Stress left ventricular function is normal. Stress EF: 68%. End diastolic cavity size is normal. Myocardial blood flow reserve is not reported in this patient due to technical or patient-specific concerns that affect accuracy. Coronary calcium  assessment not performed due to prior revascularization. Findings are consistent with infarction with peri-infarct ischemia. The study is intermediate risk.   TRANSTHORACIC ECHOCARDIOGRAM performed on 02/02/2024 Left ventricular ejection fraction, by estimation, is 55 to 60%. The left ventricle has normal function. The left ventricle has no regional wall motion abnormalities. Left ventricular diastolic  parameters are indeterminate.  Right ventricular systolic function is normal. The right ventricular size is normal. Tricuspid regurgitation signal is inadequate for assessing PA pressure.  The mitral valve is normal in structure. Trivial mitral valve regurgitation. No evidence of mitral stenosis.  The aortic valve is normal in structure. Aortic valve regurgitation is not visualized. No aortic stenosis is present.  The inferior vena cava is normal in size with greater than 50% respiratory  variability, suggesting right atrial pressure of 3 mmHg.   MR CERVICAL SPINE WO CONTRAST performed on 04/07/2023 Multilevel cervical spondylosis appears similar to the 2019 exam. The ventral thecal sac is effaced at C3-4, C4-5 and C6-7. There is deformity of the ventral cord at C4-5 and C5-6. Severe left foraminal narrowing C3-4, moderate to moderately severe bilateral foraminal narrowing at C4-5 and moderately severe right foraminal narrowing at C6-7.  MR THORACIC SPINE WO CONTRAST performed on 04/07/2023 No evidence of high-grade spinal canal stenosis.  There are multilevel small degenerative disc protrusions, most notably at T6-T7 and T7-T8. No evidence of high-grade neural foraminal stenosis. No cord signal abnormality.  MR LUMBAR SPINE WO CONTRAST performed on 02/17/2023 Multilevel lumbar spondylosis, worst at L2-L3, L4-L5, and L5-S1, where there is compression of the traversing right L3, left L5, and right S1 nerve roots in the subarticular zones, respectively. Moderate right neural foraminal narrowing at L2-L3 and moderate bilateral neural foraminal narrowing at L4-L5.  LEFT HEART CATHETERIZATION AND CORONARY ANGIOGRAPHY performed on 10/10/2022 Severe 3 vessel obstructive CAD Ost LM to Dist LM lesion is 50% stenosed. Mid LAD lesion is 100% stenosed. Ost LAD to Mid LAD lesion is 99% stenosed. Ost Cx to Prox Cx lesion is 99% stenosed. Origin lesion is 30% stenosed. Origin to Insertion lesion between  [place] and 3rd Mrg  is 100% stenosed. Origin to Insertion lesion is 100% stenosed. 3rd Mrg lesion is 100% stenosed. Patent LIMA to the LAD Patent RIMA to the PDA Patent SVG to the ramus intermedius Normal LVEDP Plan: no change compared to prior cardiac cath in July 2023. Continue medical management.    IMPRESSION AND PLAN: Kaj Vasil. has been referred for pre-anesthesia review and clearance prior to him undergoing the planned anesthetic and procedural courses. Available labs, pertinent testing, and imaging results were personally reviewed by me in preparation for upcoming operative/procedural course. [facility] medical record has been updated following extensive record review and patient interview with PAT staff.   This patient has been appropriately cleared by cardiology with an overall acceptable risk of patient experiencing significant perioperative cardiovascular complications. Based on clinical review performed today (02/18/24), barring any significant acute changes in the patient's overall condition, it is anticipated that he will be able to proceed with the planned surgical intervention. Any acute changes in clinical condition may necessitate his procedure being postponed and/or cancelled. Patient will meet with anesthesia team (MD and/or CRNA) on the day of his procedure for preoperative evaluation/assessment. Questions regarding anesthetic course will be fielded at that time.   Pre-surgical instructions were reviewed with the patient during his PAT appointment, and questions were fielded to satisfaction by PAT clinical staff. He has been instructed on which medications that he will need to hold prior to surgery, as well as the ones that have been deemed safe/appropriate to take on the day of his procedure. As part of the general education provided by PAT, patient made aware both verbally and in writing, that he would need to abstain from the use of any illegal substances during  his perioperative course. He was advised that failure to follow the provided instructions could necessitate case cancellation or result in serious perioperative complications up to and including death. Patient encouraged to contact PAT and/or his surgeon's office to discuss any questions or concerns that may arise prior to surgery; verbalized understanding.   Dorise Pereyra, MSN, APRN, FNP-C, CEN [facility]  Perioperative Services Nurse Practitioner Phone: [phone] Fax: [phone]  02/18/24 11:44 AM  NOTE: This note has been prepared using Scientist, clinical (histocompatibility and immunogenetics). Despite my best ability to proofread, there is always the potential that unintentional transcriptional errors may still occur from this process.

## 2024-02-22 ENCOUNTER — Encounter
Admission: RE | Disposition: A | Discharge status: Home Health | Payer: Self-pay | Source: Ambulatory Visit | Attending: Neurosurgery

## 2024-02-22 ENCOUNTER — Encounter: Payer: Self-pay | Admitting: Neurosurgery

## 2024-02-22 ENCOUNTER — Other Ambulatory Visit: Payer: Self-pay

## 2024-02-22 ENCOUNTER — Inpatient Hospital Stay
Admission: RE | Admit: 2024-02-22 | Discharge: 2024-02-27 | DRG: 518 | Disposition: A | Discharge status: Home Health | Source: Ambulatory Visit | Attending: Internal Medicine | Admitting: Internal Medicine

## 2024-02-22 ENCOUNTER — Inpatient Hospital Stay: Payer: Self-pay | Admitting: Urgent Care

## 2024-02-22 ENCOUNTER — Inpatient Hospital Stay

## 2024-02-22 DIAGNOSIS — E872 Acidosis, unspecified: Secondary | ICD-10-CM | POA: Diagnosis present

## 2024-02-22 DIAGNOSIS — F419 Anxiety disorder, unspecified: Secondary | ICD-10-CM | POA: Diagnosis present

## 2024-02-22 DIAGNOSIS — K760 Fatty (change of) liver, not elsewhere classified: Secondary | ICD-10-CM | POA: Diagnosis present

## 2024-02-22 DIAGNOSIS — K920 Hematemesis: Secondary | ICD-10-CM | POA: Diagnosis not present

## 2024-02-22 DIAGNOSIS — R079 Chest pain, unspecified: Secondary | ICD-10-CM | POA: Diagnosis not present

## 2024-02-22 DIAGNOSIS — E538 Deficiency of other specified B group vitamins: Secondary | ICD-10-CM | POA: Diagnosis present

## 2024-02-22 DIAGNOSIS — I129 Hypertensive chronic kidney disease with stage 1 through stage 4 chronic kidney disease, or unspecified chronic kidney disease: Secondary | ICD-10-CM | POA: Diagnosis present

## 2024-02-22 DIAGNOSIS — I700 Atherosclerosis of aorta: Secondary | ICD-10-CM | POA: Diagnosis present

## 2024-02-22 DIAGNOSIS — R000 Tachycardia, unspecified: Secondary | ICD-10-CM | POA: Diagnosis not present

## 2024-02-22 DIAGNOSIS — N179 Acute kidney failure, unspecified: Secondary | ICD-10-CM | POA: Diagnosis not present

## 2024-02-22 DIAGNOSIS — G992 Myelopathy in diseases classified elsewhere: Secondary | ICD-10-CM | POA: Diagnosis present

## 2024-02-22 DIAGNOSIS — E039 Hypothyroidism, unspecified: Secondary | ICD-10-CM | POA: Diagnosis present

## 2024-02-22 DIAGNOSIS — K5900 Constipation, unspecified: Secondary | ICD-10-CM | POA: Diagnosis present

## 2024-02-22 DIAGNOSIS — G959 Disease of spinal cord, unspecified: Principal | ICD-10-CM

## 2024-02-22 DIAGNOSIS — J690 Pneumonitis due to inhalation of food and vomit: Secondary | ICD-10-CM | POA: Diagnosis not present

## 2024-02-22 DIAGNOSIS — R4781 Slurred speech: Secondary | ICD-10-CM | POA: Diagnosis not present

## 2024-02-22 DIAGNOSIS — E785 Hyperlipidemia, unspecified: Secondary | ICD-10-CM | POA: Diagnosis present

## 2024-02-22 DIAGNOSIS — N1831 Chronic kidney disease, stage 3a: Secondary | ICD-10-CM | POA: Diagnosis present

## 2024-02-22 DIAGNOSIS — Z7951 Long term (current) use of inhaled steroids: Secondary | ICD-10-CM

## 2024-02-22 DIAGNOSIS — K449 Diaphragmatic hernia without obstruction or gangrene: Secondary | ICD-10-CM | POA: Diagnosis present

## 2024-02-22 DIAGNOSIS — Z951 Presence of aortocoronary bypass graft: Secondary | ICD-10-CM

## 2024-02-22 DIAGNOSIS — Z7952 Long term (current) use of systemic steroids: Secondary | ICD-10-CM

## 2024-02-22 DIAGNOSIS — M109 Gout, unspecified: Secondary | ICD-10-CM | POA: Diagnosis present

## 2024-02-22 DIAGNOSIS — I200 Unstable angina: Secondary | ICD-10-CM | POA: Diagnosis not present

## 2024-02-22 DIAGNOSIS — G9341 Metabolic encephalopathy: Secondary | ICD-10-CM | POA: Diagnosis not present

## 2024-02-22 DIAGNOSIS — I25110 Atherosclerotic heart disease of native coronary artery with unstable angina pectoris: Secondary | ICD-10-CM | POA: Diagnosis present

## 2024-02-22 DIAGNOSIS — R6520 Severe sepsis without septic shock: Secondary | ICD-10-CM | POA: Diagnosis not present

## 2024-02-22 DIAGNOSIS — H9319 Tinnitus, unspecified ear: Secondary | ICD-10-CM | POA: Diagnosis present

## 2024-02-22 DIAGNOSIS — Z79890 Hormone replacement therapy (postmenopausal): Secondary | ICD-10-CM

## 2024-02-22 DIAGNOSIS — D649 Anemia, unspecified: Secondary | ICD-10-CM | POA: Diagnosis not present

## 2024-02-22 DIAGNOSIS — R412 Retrograde amnesia: Secondary | ICD-10-CM | POA: Diagnosis not present

## 2024-02-22 DIAGNOSIS — J189 Pneumonia, unspecified organism: Secondary | ICD-10-CM | POA: Diagnosis not present

## 2024-02-22 DIAGNOSIS — K219 Gastro-esophageal reflux disease without esophagitis: Secondary | ICD-10-CM | POA: Diagnosis present

## 2024-02-22 DIAGNOSIS — I214 Non-ST elevation (NSTEMI) myocardial infarction: Secondary | ICD-10-CM | POA: Diagnosis not present

## 2024-02-22 DIAGNOSIS — G4700 Insomnia, unspecified: Secondary | ICD-10-CM | POA: Diagnosis present

## 2024-02-22 DIAGNOSIS — I25118 Atherosclerotic heart disease of native coronary artery with other forms of angina pectoris: Secondary | ICD-10-CM | POA: Diagnosis not present

## 2024-02-22 DIAGNOSIS — R7989 Other specified abnormal findings of blood chemistry: Secondary | ICD-10-CM | POA: Diagnosis not present

## 2024-02-22 DIAGNOSIS — I2489 Other forms of acute ischemic heart disease: Secondary | ICD-10-CM | POA: Diagnosis not present

## 2024-02-22 DIAGNOSIS — G928 Other toxic encephalopathy: Secondary | ICD-10-CM | POA: Diagnosis not present

## 2024-02-22 DIAGNOSIS — R739 Hyperglycemia, unspecified: Secondary | ICD-10-CM | POA: Diagnosis not present

## 2024-02-22 DIAGNOSIS — K910 Vomiting following gastrointestinal surgery: Secondary | ICD-10-CM | POA: Diagnosis not present

## 2024-02-22 DIAGNOSIS — Z8249 Family history of ischemic heart disease and other diseases of the circulatory system: Secondary | ICD-10-CM

## 2024-02-22 DIAGNOSIS — D509 Iron deficiency anemia, unspecified: Secondary | ICD-10-CM | POA: Diagnosis not present

## 2024-02-22 DIAGNOSIS — A419 Sepsis, unspecified organism: Secondary | ICD-10-CM | POA: Diagnosis not present

## 2024-02-22 DIAGNOSIS — R112 Nausea with vomiting, unspecified: Secondary | ICD-10-CM

## 2024-02-22 DIAGNOSIS — H9190 Unspecified hearing loss, unspecified ear: Secondary | ICD-10-CM | POA: Diagnosis present

## 2024-02-22 DIAGNOSIS — Z7982 Long term (current) use of aspirin: Secondary | ICD-10-CM

## 2024-02-22 DIAGNOSIS — Z79899 Other long term (current) drug therapy: Secondary | ICD-10-CM

## 2024-02-22 DIAGNOSIS — N183 Chronic kidney disease, stage 3 unspecified: Secondary | ICD-10-CM | POA: Diagnosis not present

## 2024-02-22 DIAGNOSIS — D62 Acute posthemorrhagic anemia: Secondary | ICD-10-CM | POA: Diagnosis not present

## 2024-02-22 DIAGNOSIS — Z833 Family history of diabetes mellitus: Secondary | ICD-10-CM

## 2024-02-22 DIAGNOSIS — R0682 Tachypnea, not elsewhere classified: Secondary | ICD-10-CM | POA: Diagnosis not present

## 2024-02-22 DIAGNOSIS — R066 Hiccough: Secondary | ICD-10-CM | POA: Diagnosis not present

## 2024-02-22 DIAGNOSIS — F32A Depression, unspecified: Secondary | ICD-10-CM | POA: Diagnosis present

## 2024-02-22 DIAGNOSIS — R339 Retention of urine, unspecified: Secondary | ICD-10-CM | POA: Diagnosis not present

## 2024-02-22 DIAGNOSIS — M4802 Spinal stenosis, cervical region: Principal | ICD-10-CM | POA: Diagnosis present

## 2024-02-22 DIAGNOSIS — J9601 Acute respiratory failure with hypoxia: Secondary | ICD-10-CM | POA: Diagnosis not present

## 2024-02-22 DIAGNOSIS — E782 Mixed hyperlipidemia: Secondary | ICD-10-CM | POA: Diagnosis not present

## 2024-02-22 DIAGNOSIS — Z01818 Encounter for other preprocedural examination: Secondary | ICD-10-CM

## 2024-02-22 DIAGNOSIS — I252 Old myocardial infarction: Secondary | ICD-10-CM

## 2024-02-22 HISTORY — DX: Disease of spinal cord, unspecified: G95.9

## 2024-02-22 HISTORY — DX: Diverticulosis of intestine, part unspecified, without perforation or abscess without bleeding: K57.90

## 2024-02-22 HISTORY — DX: Radiculopathy, cervical region: M54.12

## 2024-02-22 HISTORY — PX: CERVICAL LAMINOPLASTY: SHX7539

## 2024-02-22 HISTORY — DX: Diaphragmatic hernia without obstruction or gangrene: K44.9

## 2024-02-22 HISTORY — DX: Diverticulosis of small intestine without perforation or abscess without bleeding: K57.10

## 2024-02-22 HISTORY — DX: Insomnia, unspecified: G47.00

## 2024-02-22 HISTORY — DX: Fatty (change of) liver, not elsewhere classified: K76.0

## 2024-02-22 HISTORY — DX: Long term (current) use of aspirin: Z79.82

## 2024-02-22 HISTORY — DX: Umbilical hernia without obstruction or gangrene: K42.9

## 2024-02-22 HISTORY — DX: Spinal stenosis, cervical region: M48.02

## 2024-02-22 HISTORY — DX: Other intervertebral disc degeneration, thoracolumbar region: M51.35

## 2024-02-22 LAB — CREATININE, SERUM
Creatinine, Ser: 1.75 mg/dL — ABNORMAL HIGH (ref 0.61–1.24)
GFR, Estimated: 41 mL/min — ABNORMAL LOW (ref >60)

## 2024-02-22 LAB — ABO/RH: ABO/RH(D): O POS

## 2024-02-22 SURGERY — CERVICAL LAMINOPLASTY
Anesthesia: General | Site: Spine Cervical

## 2024-02-22 MED ORDER — MIDAZOLAM HCL 2 MG/2ML IJ SOLN
INTRAMUSCULAR | Status: AC
  Filled 2024-02-22: qty 2

## 2024-02-22 MED ORDER — HYDROMORPHONE HCL 1 MG/ML IJ SOLN
INTRAMUSCULAR | Status: AC
  Filled 2024-02-22: qty 1

## 2024-02-22 MED ORDER — ACETAMINOPHEN 650 MG RE SUPP: 650.0000 mg | RECTAL | Status: DC | PRN

## 2024-02-22 MED ORDER — CEFAZOLIN SODIUM-DEXTROSE 2-3 GM-%(50ML) IV SOLR
INTRAVENOUS | Status: DC | PRN
  Administered 2024-02-22: 2 g via INTRAVENOUS

## 2024-02-22 MED ORDER — DEXMEDETOMIDINE HCL IN NACL 80 MCG/20ML IV SOLN
INTRAVENOUS | Status: DC | PRN
  Administered 2024-02-22: 4 ug via INTRAVENOUS

## 2024-02-22 MED ORDER — PHENYLEPHRINE 80 MCG/ML (10ML) SYRINGE FOR IV PUSH (FOR BLOOD PRESSURE SUPPORT)
PREFILLED_SYRINGE | INTRAVENOUS | Status: AC
  Filled 2024-02-22: qty 10

## 2024-02-22 MED ORDER — FENTANYL CITRATE (PF) 100 MCG/2ML IJ SOLN
INTRAMUSCULAR | Status: AC
  Filled 2024-02-22: qty 2

## 2024-02-22 MED ORDER — ARIPIPRAZOLE 2 MG PO TABS
2.0000 mg | ORAL_TABLET | Freq: Every day | ORAL | Status: DC
  Administered 2024-02-23: 2 mg via ORAL
  Filled 2024-02-22: qty 1

## 2024-02-22 MED ORDER — CEFAZOLIN SODIUM-DEXTROSE 2-4 GM/100ML-% IV SOLN
INTRAVENOUS | Status: AC
  Filled 2024-02-22: qty 100

## 2024-02-22 MED ORDER — DEXMEDETOMIDINE HCL IN NACL 80 MCG/20ML IV SOLN
INTRAVENOUS | Status: AC
  Filled 2024-02-22: qty 20

## 2024-02-22 MED ORDER — SODIUM CHLORIDE 0.9 % IV SOLN: 250.0000 mL | INTRAVENOUS | Status: AC

## 2024-02-22 MED ORDER — SODIUM CHLORIDE (PF) 0.9 % IJ SOLN
INTRAMUSCULAR | Status: DC | PRN
  Administered 2024-02-22: 60 mL

## 2024-02-22 MED ORDER — GLYCOPYRROLATE 0.2 MG/ML IJ SOLN
INTRAMUSCULAR | Status: AC
  Filled 2024-02-22: qty 1

## 2024-02-22 MED ORDER — VANCOMYCIN HCL 1000 MG IV SOLR
INTRAVENOUS | Status: DC | PRN
  Administered 2024-02-22: 1 g via TOPICAL

## 2024-02-22 MED ORDER — SENNA 8.6 MG PO TABS
1.0000 | ORAL_TABLET | Freq: Two times a day (BID) | ORAL | Status: DC
  Administered 2024-02-22 – 2024-02-23 (×3): 8.6 mg via ORAL
  Filled 2024-02-22 (×3): qty 1

## 2024-02-22 MED ORDER — HYDROMORPHONE HCL 1 MG/ML IJ SOLN
INTRAMUSCULAR | Status: DC | PRN
  Administered 2024-02-22: .25 mg via INTRAVENOUS

## 2024-02-22 MED ORDER — ONDANSETRON HCL 4 MG/2ML IJ SOLN
4.0000 mg | Freq: Four times a day (QID) | INTRAMUSCULAR | Status: DC | PRN
  Administered 2024-02-23 (×2): 4 mg via INTRAVENOUS
  Filled 2024-02-22 (×2): qty 2

## 2024-02-22 MED ORDER — REMIFENTANIL HCL 1 MG IV SOLR
INTRAVENOUS | Status: DC | PRN
  Administered 2024-02-22: 50 ug via INTRAVENOUS
  Administered 2024-02-22: 100 ug via INTRAVENOUS

## 2024-02-22 MED ORDER — ACETAMINOPHEN 500 MG PO TABS
1000.0000 mg | ORAL_TABLET | Freq: Four times a day (QID) | ORAL | Status: DC
  Administered 2024-02-22 – 2024-02-27 (×15): 1000 mg via ORAL
  Filled 2024-02-22 (×15): qty 2

## 2024-02-22 MED ORDER — ONDANSETRON HCL 4 MG/2ML IJ SOLN
INTRAMUSCULAR | Status: DC | PRN
Start: 2024-02-22 — End: 2024-02-22
  Administered 2024-02-22: 4 mg via INTRAVENOUS

## 2024-02-22 MED ORDER — DROPERIDOL 2.5 MG/ML IJ SOLN: 0.6250 mg | Freq: Once | INTRAMUSCULAR | Status: DC | PRN

## 2024-02-22 MED ORDER — ROSUVASTATIN CALCIUM 10 MG PO TABS
20.0000 mg | ORAL_TABLET | Freq: Every day | ORAL | Status: DC
  Administered 2024-02-22 – 2024-02-26 (×4): 20 mg via ORAL
  Filled 2024-02-22 (×2): qty 1
  Filled 2024-02-22 (×3): qty 2

## 2024-02-22 MED ORDER — ACETAMINOPHEN 10 MG/ML IV SOLN: 1000.0000 mg | Freq: Once | INTRAVENOUS | Status: DC | PRN

## 2024-02-22 MED ORDER — POLYETHYLENE GLYCOL 3350 17 G PO PACK
17.0000 g | PACK | Freq: Every day | ORAL | Status: DC | PRN
  Administered 2024-02-23: 17 g via ORAL
  Filled 2024-02-22: qty 1

## 2024-02-22 MED ORDER — PHENOL 1.4 % MT LIQD
1.0000 | OROMUCOSAL | Status: DC | PRN
  Administered 2024-02-27: 1 via OROMUCOSAL
  Filled 2024-02-22: qty 177

## 2024-02-22 MED ORDER — GLYCOPYRROLATE 0.2 MG/ML IJ SOLN
INTRAMUSCULAR | Status: DC | PRN
  Administered 2024-02-22: .2 mg via INTRAVENOUS

## 2024-02-22 MED ORDER — BUPROPION HCL ER (XL) 300 MG PO TB24
300.0000 mg | ORAL_TABLET | Freq: Every day | ORAL | Status: DC
  Administered 2024-02-23 – 2024-02-27 (×4): 300 mg via ORAL
  Filled 2024-02-22: qty 1
  Filled 2024-02-22: qty 2
  Filled 2024-02-22 (×3): qty 1

## 2024-02-22 MED ORDER — SODIUM CHLORIDE 0.9% FLUSH
3.0000 mL | Freq: Two times a day (BID) | INTRAVENOUS | Status: DC
  Administered 2024-02-22 – 2024-02-27 (×10): 3 mL via INTRAVENOUS

## 2024-02-22 MED ORDER — COLCHICINE 0.6 MG PO TABS: 0.6000 mg | ORAL_TABLET | Freq: Every day | ORAL | Status: DC | PRN

## 2024-02-22 MED ORDER — DEXAMETHASONE SOD PHOSPHATE PF 10 MG/ML IJ SOLN
INTRAMUSCULAR | Status: DC | PRN
Start: 2024-02-22 — End: 2024-02-22
  Administered 2024-02-22: 10 mg via INTRAVENOUS

## 2024-02-22 MED ORDER — LEVOTHYROXINE SODIUM 50 MCG PO TABS
25.0000 ug | ORAL_TABLET | Freq: Every day | ORAL | Status: DC
  Administered 2024-02-23 – 2024-02-27 (×4): 25 ug via ORAL
  Filled 2024-02-22 (×4): qty 1

## 2024-02-22 MED ORDER — LACTATED RINGERS IV SOLN: INTRAVENOUS | Status: DC

## 2024-02-22 MED ORDER — LIDOCAINE HCL (PF) 2 % IJ SOLN
INTRAMUSCULAR | Status: AC
Start: 2024-02-22 — End: 2024-02-22
  Filled 2024-02-22: qty 5

## 2024-02-22 MED ORDER — ENOXAPARIN SODIUM 40 MG/0.4ML IJ SOSY
40.0000 mg | PREFILLED_SYRINGE | INTRAMUSCULAR | Status: DC
  Administered 2024-02-23: 40 mg via SUBCUTANEOUS
  Filled 2024-02-22 (×2): qty 0.4

## 2024-02-22 MED ORDER — OXYCODONE HCL 5 MG/5ML PO SOLN: 5.0000 mg | Freq: Once | ORAL | Status: AC | PRN

## 2024-02-22 MED ORDER — DOCUSATE SODIUM 100 MG PO CAPS
100.0000 mg | ORAL_CAPSULE | Freq: Two times a day (BID) | ORAL | Status: DC
  Administered 2024-02-22 – 2024-02-26 (×7): 100 mg via ORAL
  Filled 2024-02-22 (×8): qty 1

## 2024-02-22 MED ORDER — ACETAMINOPHEN 10 MG/ML IV SOLN
INTRAVENOUS | Status: AC
  Filled 2024-02-22: qty 100

## 2024-02-22 MED ORDER — ALPRAZOLAM 0.5 MG PO TABS
0.5000 mg | ORAL_TABLET | Freq: Three times a day (TID) | ORAL | Status: DC | PRN
  Administered 2024-02-23 (×2): 0.5 mg via ORAL
  Filled 2024-02-22 (×2): qty 1

## 2024-02-22 MED ORDER — PANTOPRAZOLE SODIUM 40 MG PO TBEC
40.0000 mg | DELAYED_RELEASE_TABLET | Freq: Once | ORAL | Status: AC
  Administered 2024-02-22: 40 mg via ORAL
  Filled 2024-02-22: qty 1

## 2024-02-22 MED ORDER — 0.9 % SODIUM CHLORIDE (POUR BTL) OPTIME
TOPICAL | Status: DC | PRN
  Administered 2024-02-22: 500 mL

## 2024-02-22 MED ORDER — ISOSORBIDE MONONITRATE ER 30 MG PO TB24
60.0000 mg | ORAL_TABLET | Freq: Every day | ORAL | Status: DC
  Administered 2024-02-22 – 2024-02-23 (×2): 60 mg via ORAL
  Filled 2024-02-22 (×2): qty 2

## 2024-02-22 MED ORDER — ACETAMINOPHEN 325 MG PO TABS
650.0000 mg | ORAL_TABLET | ORAL | Status: DC | PRN
  Administered 2024-02-22: 650 mg via ORAL
  Filled 2024-02-22: qty 2

## 2024-02-22 MED ORDER — ORAL CARE MOUTH RINSE: 15.0000 mL | Freq: Once | OROMUCOSAL | Status: AC

## 2024-02-22 MED ORDER — MAGNESIUM CITRATE PO SOLN: 1.0000 | Freq: Once | ORAL | Status: DC | PRN

## 2024-02-22 MED ORDER — OXYCODONE HCL 5 MG PO TABS
5.0000 mg | ORAL_TABLET | ORAL | Status: DC | PRN
  Filled 2024-02-22: qty 1

## 2024-02-22 MED ORDER — OXYCODONE HCL 5 MG PO TABS
10.0000 mg | ORAL_TABLET | ORAL | Status: DC | PRN
  Administered 2024-02-22 – 2024-02-23 (×3): 10 mg via ORAL
  Filled 2024-02-22 (×3): qty 2

## 2024-02-22 MED ORDER — SODIUM CHLORIDE 0.9 % IV SOLN
INTRAVENOUS | Status: DC | PRN
  Administered 2024-02-22: .1 ug/kg/min via INTRAVENOUS

## 2024-02-22 MED ORDER — VANCOMYCIN HCL 1000 MG IV SOLR
INTRAVENOUS | Status: AC
  Filled 2024-02-22: qty 20

## 2024-02-22 MED ORDER — PROPOFOL 500 MG/50ML IV EMUL
INTRAVENOUS | Status: DC | PRN
Start: 2024-02-22 — End: 2024-02-22
  Administered 2024-02-22: 150 ug/kg/min via INTRAVENOUS

## 2024-02-22 MED ORDER — ZOLPIDEM TARTRATE 5 MG PO TABS
5.0000 mg | ORAL_TABLET | Freq: Every evening | ORAL | Status: DC | PRN
  Administered 2024-02-22 – 2024-02-23 (×2): 5 mg via ORAL
  Filled 2024-02-22 (×2): qty 1

## 2024-02-22 MED ORDER — OXYCODONE HCL 5 MG PO TABS
5.0000 mg | ORAL_TABLET | Freq: Once | ORAL | Status: AC | PRN
  Administered 2024-02-22: 5 mg via ORAL

## 2024-02-22 MED ORDER — ONDANSETRON HCL 4 MG PO TABS
4.0000 mg | ORAL_TABLET | Freq: Four times a day (QID) | ORAL | Status: DC | PRN
  Administered 2024-02-23: 4 mg via ORAL
  Filled 2024-02-22: qty 1

## 2024-02-22 MED ORDER — SORBITOL 70 % SOLN
30.0000 mL | Freq: Every day | Status: DC | PRN
  Administered 2024-02-23: 30 mL via ORAL
  Filled 2024-02-22 (×3): qty 30

## 2024-02-22 MED ORDER — PROPOFOL 10 MG/ML IV BOLUS
INTRAVENOUS | Status: DC | PRN
  Administered 2024-02-22: 150 mg via INTRAVENOUS
  Administered 2024-02-22 (×2): 50 mg via INTRAVENOUS

## 2024-02-22 MED ORDER — HYDROMORPHONE HCL 1 MG/ML IJ SOLN
0.5000 mg | INTRAMUSCULAR | Status: AC | PRN
  Administered 2024-02-23: 0.5 mg via INTRAVENOUS
  Filled 2024-02-22: qty 0.5

## 2024-02-22 MED ORDER — PROPOFOL 1000 MG/100ML IV EMUL
INTRAVENOUS | Status: AC
  Filled 2024-02-22: qty 100

## 2024-02-22 MED ORDER — SURGIFLO WITH THROMBIN (HEMOSTATIC MATRIX KIT) OPTIME
TOPICAL | Status: DC | PRN
  Administered 2024-02-22: 1 via TOPICAL

## 2024-02-22 MED ORDER — LISINOPRIL 5 MG PO TABS
10.0000 mg | ORAL_TABLET | Freq: Every day | ORAL | Status: DC
  Administered 2024-02-23: 10 mg via ORAL
  Filled 2024-02-22: qty 1

## 2024-02-22 MED ORDER — EPHEDRINE SULFATE (PRESSORS) 50 MG/ML IJ SOLN
INTRAMUSCULAR | Status: AC
  Filled 2024-02-22: qty 1

## 2024-02-22 MED ORDER — PHENYLEPHRINE 80 MCG/ML (10ML) SYRINGE FOR IV PUSH (FOR BLOOD PRESSURE SUPPORT)
PREFILLED_SYRINGE | INTRAVENOUS | Status: DC | PRN
  Administered 2024-02-22 (×13): 80 ug via INTRAVENOUS

## 2024-02-22 MED ORDER — ACETAMINOPHEN 10 MG/ML IV SOLN
INTRAVENOUS | Status: DC | PRN
  Administered 2024-02-22: 1000 mg via INTRAVENOUS

## 2024-02-22 MED ORDER — CEFAZOLIN IN SODIUM CHLORIDE 2-0.9 GM/100ML-% IV SOLN: 2.0000 g | Freq: Once | INTRAVENOUS | Status: DC

## 2024-02-22 MED ORDER — MIDAZOLAM HCL 2 MG/2ML IJ SOLN
INTRAMUSCULAR | Status: DC | PRN
  Administered 2024-02-22: 2 mg via INTRAVENOUS

## 2024-02-22 MED ORDER — CHLORHEXIDINE GLUCONATE 0.12 % MT SOLN
OROMUCOSAL | Status: AC
  Filled 2024-02-22: qty 15

## 2024-02-22 MED ORDER — PROPOFOL 10 MG/ML IV BOLUS
INTRAVENOUS | Status: AC
  Filled 2024-02-22: qty 20

## 2024-02-22 MED ORDER — EPHEDRINE SULFATE-NACL 50-0.9 MG/10ML-% IV SOSY
PREFILLED_SYRINGE | INTRAVENOUS | Status: DC | PRN
  Administered 2024-02-22 (×2): 5 mg via INTRAVENOUS
  Administered 2024-02-22: 10 mg via INTRAVENOUS
  Administered 2024-02-22 (×2): 5 mg via INTRAVENOUS
  Administered 2024-02-22: 10 mg via INTRAVENOUS

## 2024-02-22 MED ORDER — FENTANYL CITRATE (PF) 100 MCG/2ML IJ SOLN
25.0000 ug | INTRAMUSCULAR | Status: DC | PRN
  Administered 2024-02-22 (×2): 25 ug via INTRAVENOUS

## 2024-02-22 MED ORDER — OXYCODONE HCL 5 MG PO TABS
ORAL_TABLET | ORAL | Status: AC
  Filled 2024-02-22: qty 1

## 2024-02-22 MED ORDER — REMIFENTANIL HCL 1 MG IV SOLR
INTRAVENOUS | Status: AC
  Filled 2024-02-22: qty 1000

## 2024-02-22 MED ORDER — METHOCARBAMOL 1000 MG/10ML IJ SOLN: 500.0000 mg | Freq: Four times a day (QID) | INTRAMUSCULAR | Status: DC | PRN

## 2024-02-22 MED ORDER — SODIUM CHLORIDE 0.9% FLUSH: 3.0000 mL | INTRAVENOUS | Status: DC | PRN

## 2024-02-22 MED ORDER — SUCCINYLCHOLINE CHLORIDE 200 MG/10ML IV SOSY
PREFILLED_SYRINGE | INTRAVENOUS | Status: DC | PRN
  Administered 2024-02-22: 70 mg via INTRAVENOUS

## 2024-02-22 MED ORDER — LIDOCAINE HCL (CARDIAC) PF 100 MG/5ML IV SOSY
PREFILLED_SYRINGE | INTRAVENOUS | Status: DC | PRN
  Administered 2024-02-22: 60 mg via INTRAVENOUS

## 2024-02-22 MED ORDER — METHOCARBAMOL 500 MG PO TABS
500.0000 mg | ORAL_TABLET | Freq: Four times a day (QID) | ORAL | Status: DC | PRN
  Administered 2024-02-22 – 2024-02-23 (×2): 500 mg via ORAL
  Filled 2024-02-22 (×2): qty 1

## 2024-02-22 MED ORDER — FENTANYL CITRATE (PF) 100 MCG/2ML IJ SOLN
INTRAMUSCULAR | Status: DC | PRN
  Administered 2024-02-22 (×2): 50 ug via INTRAVENOUS

## 2024-02-22 MED ORDER — CHLORHEXIDINE GLUCONATE 0.12 % MT SOLN
15.0000 mL | Freq: Once | OROMUCOSAL | Status: AC
  Administered 2024-02-22: 15 mL via OROMUCOSAL

## 2024-02-22 MED ORDER — PANTOPRAZOLE SODIUM 40 MG PO TBEC
80.0000 mg | DELAYED_RELEASE_TABLET | Freq: Every day | ORAL | Status: DC
  Administered 2024-02-23: 80 mg via ORAL
  Filled 2024-02-22: qty 2

## 2024-02-22 MED ORDER — MENTHOL 3 MG MT LOZG
1.0000 | LOZENGE | OROMUCOSAL | Status: DC | PRN
  Administered 2024-02-26: 3 mg via ORAL
  Filled 2024-02-22: qty 9

## 2024-02-22 MED ORDER — SUCCINYLCHOLINE CHLORIDE 200 MG/10ML IV SOSY
PREFILLED_SYRINGE | INTRAVENOUS | Status: AC
  Filled 2024-02-22: qty 10

## 2024-02-22 MED ORDER — BUPIVACAINE-EPINEPHRINE (PF) 0.5% -1:200000 IJ SOLN
INTRAMUSCULAR | Status: DC | PRN
Start: 2024-02-22 — End: 2024-02-22
  Administered 2024-02-22: 10 mL

## 2024-02-22 MED ORDER — METOPROLOL SUCCINATE ER 50 MG PO TB24
25.0000 mg | ORAL_TABLET | Freq: Every day | ORAL | Status: DC
  Administered 2024-02-23: 25 mg via ORAL
  Filled 2024-02-22: qty 1

## 2024-02-22 MED ORDER — ALLOPURINOL 100 MG PO TABS
100.0000 mg | ORAL_TABLET | Freq: Every day | ORAL | Status: DC
  Administered 2024-02-23 – 2024-02-27 (×4): 100 mg via ORAL
  Filled 2024-02-22 (×5): qty 1

## 2024-02-22 SURGICAL SUPPLY — 36 items
BASIN KIT SINGLE STR (MISCELLANEOUS) ×1 IMPLANT
BIT DRILL CANOPY FLUTED 1.4 (BIT) IMPLANT
BUR NEURO DRILL SOFT 3.0X3.8M (BURR) ×1 IMPLANT
DERMABOND ADVANCED .7 DNX12 (GAUZE/BANDAGES/DRESSINGS) ×1 IMPLANT
DRAPE C ARM PK CFD 31 SPINE (DRAPES) ×1 IMPLANT
DRAPE LAPAROTOMY 100X77 ABD (DRAPES) ×1 IMPLANT
DRSG OPSITE POSTOP 4X6 (GAUZE/BANDAGES/DRESSINGS) IMPLANT
ELECTRODE REM PT RTRN 9FT ADLT (ELECTROSURGICAL) ×1 IMPLANT
EVACUATOR 1/8 PVC DRAIN (DRAIN) IMPLANT
FEE INTRAOP CADWELL SUPPLY NCS (MISCELLANEOUS) IMPLANT
FEE INTRAOP MONITOR IMPULS NCS (MISCELLANEOUS) IMPLANT
GLOVE BIOGEL PI IND STRL 6.5 (GLOVE) ×1 IMPLANT
GLOVE SURG SYN 6.5 PF PI (GLOVE) ×1 IMPLANT
GLOVE SURG SYN 8.5 PF PI (GLOVE) ×3 IMPLANT
GOWN SRG LRG LVL 4 IMPRV REINF (GOWNS) ×1 IMPLANT
GOWN SRG XL LVL 3 NONREINFORCE (GOWNS) ×1 IMPLANT
KIT SPINAL PRONEVIEW (KITS) ×1 IMPLANT
LAVAGE JET IRRISEPT WOUND (IRRIGATION / IRRIGATOR) ×1 IMPLANT
MANIFOLD NEPTUNE II (INSTRUMENTS) ×1 IMPLANT
NDL SAFETY ECLIP 18X1.5 (MISCELLANEOUS) ×1 IMPLANT
NS IRRIG 500ML POUR BTL (IV SOLUTION) ×1 IMPLANT
PACK LAMINECTOMY ARMC (PACKS) ×1 IMPLANT
PAD ARMBOARD POSITIONER FOAM (MISCELLANEOUS) ×1 IMPLANT
PLATE CANOPY SHELF INLINE 5 (Plate) IMPLANT
PLATE CANOPY SHELF INLINE 7 (Plate) IMPLANT
SCREW SELF DRILL RELIEVE 4 (Screw) IMPLANT
SLEEVE DEPTH 4 (SLEEVE) IMPLANT
STAPLER SKIN PROX 35W (STAPLE) ×1 IMPLANT
SURGIFLO W/THROMBIN 8M KIT (HEMOSTASIS) ×1 IMPLANT
SUT STRATA 3-0 15 PS-2 (SUTURE) IMPLANT
SUT VIC AB 0 CT1 27XCR 8 STRN (SUTURE) ×2 IMPLANT
SUT VIC AB 2-0 CT1 18 (SUTURE) ×2 IMPLANT
SUTURE EHLN 3-0 FS-10 30 BLK (SUTURE) ×1 IMPLANT
SYR 30ML LL (SYRINGE) ×2 IMPLANT
TAPE CLOTH 3X10 WHT NS LF (GAUZE/BANDAGES/DRESSINGS) ×2 IMPLANT
TRAP FLUID SMOKE EVACUATOR (MISCELLANEOUS) ×1 IMPLANT

## 2024-02-22 NOTE — H&P (Signed)
 Referring Physician:  Clois Fret, MD [address] [place],  [place] [address]  Primary Physician:  [name], MD  History of Present Illness: 02/22/2024 Billy Cisneros presents today for surgical intervention.  12/17/2023 Billy Cisneros presents with worsening balance.  He also has numbness in his hands.   07/14/2023 He has been doing a little bit better.  He changed shoes.  He did not try physical therapy.  He has not had any falls.  05/12/2023 Billy Cisneros returns to see me.  He has had continued issues with falls.   03/19/2023 Billy Cisneros is here today with a chief complaint of low back pain with bilateral leg weakness with several falls recently.  He has issues where his legs gave out.  His left leg is worse than his right leg.  He also has numbness in his left leg worse than his right leg.  The numbness exists over his entire leg.  He has right arm numbness as well.  He has grip weakness and hand tingling.  He has started using a cane this year.  Last year, he did not use any assistance.  He is very hard of hearing and has had worsening hearing over the past several years.  He is also having tinnitus.    Bowel/Bladder Dysfunction: none  Conservative measures:  Physical therapy: has not participated in  Multimodal medical therapy including regular antiinflammatories: Prenisone, Methocarbamol , Hydrocodone  Injections: has not received epidural steroid injections for his back  Past Surgery: no previous spinal surgeries   Vinie VEAR Lyme Mickey. has some symptoms of cervical myelopathy.  The symptoms are causing a significant impact on the patient's life.   I have utilized the care everywhere function in epic to review the outside records available from external health systems.  Review of Systems:  A 10 point review of systems is negative, except for the pertinent positives and negatives detailed in the HPI.  Past Medical  History: Past Medical History:  Diagnosis Date   Anxiety    a.) on BZO (alprazolam ) PRN   Cervical myelopathy (HCC)    CKD (chronic kidney disease) stage 3, GFR 30-59 ml/min (HCC)    Coronary artery disease 03/15/1999   a.) LHC 03/15/1999: multi-vessel CAD --> CVTS; b.) s/p 5v CABG 03/18/1999; c.) LHC 03/29/2003, 03/12/2006, 01/12/2007, 05/02/2010 - MV Dz with patent bypass grafts x 5; d.) LHC 10/14/2019: MV Dz with CTO OM1; e.) LHC 11/11/2021: MV Dz with CTO SVG-OM1 and SVG-D1; f.) NSTEMI 10/09/2022 --> LHC 10/10/2022: MV Dz with CTO OM1 and SVG D1 - med mgmt   DDD (degenerative disc disease), thoracolumbar    Depression    Diverticulosis    Duodenal diverticulum    GERD (gastroesophageal reflux disease)    Gout    Hepatic steatosis    Hiatal hernia    History of cocaine abuse (HCC)    last used in 2015 per pt (as of 02-15-24)   Hyperlipidemia    Hypertension    Hypothyroidism    Insomnia    a.) on hypnotic (zolpidem ) PRN   Long-term use of aspirin  therapy    NSTEMI (non-ST elevated myocardial infarction) (HCC) 10/09/2022   S/P CABG x 5 03/18/1999   a.) LIMA-LAD, RIMA-RCA, SVG-OM1, SVG-D1, SVG-RI   Spinal stenosis of cervical region with radiculopathy    Umbilical hernia    Unstable angina (HCC) 11/11/2021    Past Surgical History: Past Surgical History:  Procedure Laterality Date   COLONOSCOPY  WITH PROPOFOL  N/A 05/25/2017   Procedure: COLONOSCOPY WITH PROPOFOL ;  Surgeon: Therisa Bi, MD;  Location[facility] ENDOSCOPY;  Service: Gastroenterology;  Laterality: N/A;   CORONARY ARTERY BYPASS GRAFT N/A 03/18/1999   Procedure: CORONARY ARTERY BYPASS GRAFT; Location: [place]   ESOPHAGOGASTRODUODENOSCOPY N/A 10/27/2017   Procedure: ESOPHAGOGASTRODUODENOSCOPY (EGD);  Surgeon: Unk Corinn Skiff, MD;  Location: [facility] ENDOSCOPY;  Service: Gastroenterology;  Laterality: N/A;   ESOPHAGOGASTRODUODENOSCOPY (EGD) WITH PROPOFOL  N/A 11/19/2017   Procedure: ESOPHAGOGASTRODUODENOSCOPY (EGD)  WITH PROPOFOL ;  Surgeon: Unk Corinn Skiff, MD;  Location: [facility] ENDOSCOPY;  Service: Gastroenterology;  Laterality: N/A;   LEFT HEART CATH AND CORONARY ANGIOGRAPHY Left 03/15/1999   Procedure: LEFT HEART CATH AND CORONARY ANGIOGRAPHY; Location: [facility]   LEFT HEART CATH AND CORS/GRAFTS ANGIOGRAPHY N/A 10/14/2019   Procedure: LEFT HEART CATH AND CORS/GRAFTS ANGIOGRAPHY;  Surgeon: Claudene Victory ORN, MD;  Location: [facility] INVASIVE CV LAB;  Service: Cardiovascular;  Laterality: N/A;   LEFT HEART CATH AND CORS/GRAFTS ANGIOGRAPHY N/A 11/11/2021   Procedure: LEFT HEART CATH AND CORS/GRAFTS ANGIOGRAPHY;  Surgeon: Mady Bruckner, MD;  Location: [facility] INVASIVE CV LAB;  Service: Cardiovascular;  Laterality: N/A;   LEFT HEART CATH AND CORS/GRAFTS ANGIOGRAPHY N/A 10/10/2022   Procedure: LEFT HEART CATH AND CORS/GRAFTS ANGIOGRAPHY;  Surgeon: Swaziland, Peter M, MD;  Location: [facility] INVASIVE CV LAB;  Service: Cardiovascular;  Laterality: N/A;   LEFT HEART CATH AND CORS/GRAFTS ANGIOGRAPHY Left 03/29/2003   Procedure: LEFT HEART CATH AND CORS/GRAFTS ANGIOGRAPHY; Location: [facility]; Surgeon: Vinie Jude, MD   LEFT HEART CATH AND CORS/GRAFTS ANGIOGRAPHY Left 03/12/2006   Procedure: LEFT HEART CATH AND CORS/GRAFTS ANGIOGRAPHY; Location: [facility]; Surgeon: Wolm Rhyme, MD   LEFT HEART CATH AND CORS/GRAFTS ANGIOGRAPHY Left 01/12/2007   Procedure: LEFT HEART CATH AND CORS/GRAFTS ANGIOGRAPHY; Location: [facility]; Surgeon: Wolm Rhyme, MD   LEFT HEART CATH AND CORS/GRAFTS ANGIOGRAPHY Left 05/02/2010   Procedure: LEFT HEART CATH AND CORS/GRAFTS ANGIOGRAPHY; Location: [facility]; Surgeon: Marsa Dooms, MD    Allergies: Allergies as of 01/07/2024   (No Known Allergies)    Medications:  Current Facility-Administered Medications:    ceFAZolin (ANCEF) IVPB 2g/100 mL premix, 2 g, Intravenous, Once, Clois Fret, MD   chlorhexidine (PERIDEX) 0.12 % solution 15 mL, 15 mL, Mouth/Throat, Once **OR** Oral care mouth rinse, 15 mL,  Mouth Rinse, Once, Leavy Ned, MD   lactated ringers  infusion, , Intravenous, Continuous, Leavy Ned, MD  Social History: Social History   Tobacco Use   Smoking status: Never   Smokeless tobacco: Never  Vaping Use   Vaping status: Never Used  Substance Use Topics   Alcohol  use: Yes    Comment: rare   Drug use: Not Currently    Frequency: 2.0 times per week    Types: Cocaine    Comment: last used in 2015    Family Medical History: Family History  Problem Relation Age of Onset   Heart attack Mother    Hyperlipidemia Mother    Hypertension Mother    Lung cancer Father    Diabetes Sister    Diabetes Brother    Asthma Daughter    Cancer Maternal Grandmother    Stroke Maternal Grandfather    Heart attack Paternal Grandfather     Physical Examination: See chart  Heart sounds normal no MRG. Chest Clear to Auscultation Bilaterally.    General: Patient is in no apparent distress. Attention to examination is appropriate.  Neck:   Supple.  Full range of motion.  Respiratory: Patient is breathing without any difficulty.  NEUROLOGICAL:     Awake, alert, oriented to person, place, and time.  Speech is clear and fluent.   Cranial Nerves: Pupils equal round and reactive to light.  Facial tone is symmetric.  Facial sensation is symmetric. Shoulder shrug is symmetric. Tongue protrusion is midline.  There is no pronator drift.  Strength: Side Biceps Triceps Deltoid Interossei Grip Wrist Ext. Wrist Flex.  R 5 4+ 5 5 4+ 5 5  L 5 4+ 5 5 4+ 5 5   Side Iliopsoas Quads Hamstring PF DF EHL  R 5 5 5 5 5 5   L 5 5 5 5 5 5    Reflexes are 1+ and symmetric at the biceps, triceps, brachioradialis, patella and achilles.   Hoffman's is absent.   Bilateral upper and lower extremity shows diminished right arm and left leg sensation in a nondermatomal pattern.  Mild evidence of dysmetria noted.   Medical Decision Making  Imaging: MRI L spine 02/17/2023 Disc levels:    T12-L1:  Normal.   L1-L2:  Disc bulge results in mild spinal canal stenosis.   L2-L3: Right eccentric disc bulge and facet arthropathy results in compression of the traversing right L3 nerve root in the subarticular zone and moderate right neural foraminal narrowing.   L3-L4: Disc bulge and facet arthropathy results in mild spinal canal stenosis.   L4-L5: Left eccentric disc bulge and facet arthropathy results in compression of the traversing left L5 nerve root in the subarticular zone and moderate bilateral neural foraminal narrowing.   L5-S1: Right central disc protrusion with annular fissure results in compression of the traversing right S1 nerve root in the subarticular zone.   IMPRESSION: 1. Multilevel lumbar spondylosis, worst at L2-L3, L4-L5, and L5-S1, where there is compression of the traversing right L3, left L5, and right S1 nerve roots in the subarticular zones, respectively. 2. Moderate right neural foraminal narrowing at L2-L3 and moderate bilateral neural foraminal narrowing at L4-L5.     Electronically Signed   By: Ryan Chess M.D.   On: 03/06/2023 15:42   MRI CT spine 04/07/23 IMPRESSION: 1. Multilevel cervical spondylosis appears similar to the 2019 exam. The ventral thecal sac is effaced at C3-4, C4-5 and C6-7. There is deformity of the ventral cord at C4-5 and C5-6. 2. Severe left foraminal narrowing C3-4, moderate to moderately severe bilateral foraminal narrowing at C4-5 and moderately severe right foraminal narrowing at C6-7.     Electronically Signed   By: Debby Prader M.D.   On: 04/23/2023 09:48  IMPRESSION: No evidence of high-grade spinal canal or neural foraminal stenosis. No cord signal abnormality.     Electronically Signed   By: Lyndall Gore M.D.   On: 04/22/2023 14:57  I have personally reviewed the images and agree with the above interpretation.  Assessment and Plan: Billy Cisneros is a pleasant 73 y.o. male with  cervical myelopathy.  We will proceed with C3-7 laminoplasty.     Sharief Wainwright K. Clois MD, [facility] Neurosurgery

## 2024-02-22 NOTE — Op Note (Signed)
 Indications: [name] is a 73 yo male who presented with cervical myelopathy with progressive symptoms prompting surgical intervention.  Findings: cervical stenosis  Preoperative Diagnosis: G95.9 Cervical myelopathy, M48.02 Cervical spinal stenosis  Postoperative Diagnosis: same   EBL: 500 ml IVF: see anesthesia record Drains: 1 placed Disposition: Extubated and Stable to PACU Complications: none  No foley catheter was placed.   Preoperative Note:   Risks of surgery discussed include: infection, bleeding, stroke, coma, death, paralysis, CSF leak, nerve/spinal cord injury, numbness, tingling, weakness, complex regional pain syndrome, recurrent stenosis and/or disc herniation, vascular injury, development of instability, neck/back pain, need for further surgery, persistent symptoms, development of deformity, and the risks of anesthesia. The patient understood these risks and agreed to proceed.  Operative Note:   OPERATIVE PROCEDURE:  1. Posterior Cervical Laminoplasty C3-7 2. Use of flouroscopy   OPERATIVE PROCEDURE:  After induction of general anesthesia, the Mayfield was placed. The patient was then placed into the prone position on the standard table. A midline incision was then planned using fluoroscopy.  A timeout was performed, and antibiotics given.  Next, the posterior cervical region was prepped and draped in the usual sterile fashion. The incision was injected with local anesthetic, the opened sharply. A subperiosteal dissection was then carried out to expose the remaining posterior elements from C3 to C7, with careful attention paid to maintaining the facet capsules.  After satisfactory exposure had been obtained, the high speed drill was used to drill a partial thickness cut in the lamina from C3 to C7 on the left, and a full thickness cut from C3-7 on the right.  We then mobilized the lamina to expand the spinal canal.  We sized the space on the right, then placed  laminoplasty plates at each level from C3-7.  Screws were placed into the lateral mass and lamina at each level to hold the laminoplasty plates in position.  After decompression was complete, final radiographs were taken.  The wound was copiously irrigated with bacitracin-containing solution and hemostasis was achieved.    A Hemovac drain was then placed in the wound deep to the fascia.   The wound was closed in a multilayer fashion using interrupted 0 and 2-0 Vicryl sutures.  The final skin edges were reapproximated using a 3-0 monocryl.  After closure, the patient was flipped supine and the Mayfield removed.  Patient was then handed back over to anesthesia.  All counts were correct at the conclusion of the procedure.  Neurological monitoring was used throughout, and there were no changes.  Billy Goods PA acted as an Designer, television/film set throughout the case. An assistant was required for this procedure due to the complexity.  The assistant provided assistance in tissue manipulation and suction, and was required for the successful and safe performance of the procedure. I performed the critical portions of the procedure.   Billy Daisy MD

## 2024-02-22 NOTE — Discharge Instructions (Addendum)
 NEUROSURGERY DISCHARGE INSTRUCTIONS  Admission diagnosis: Cervical myelopathy (HCC) [G95.9] Cervical spinal stenosis [M48.02]  Operative procedure: C3-7 laminoplasty  What to do after you leave the hospital:  Recommended diet: regular diet. Increase protein intake to promote wound healing.  Recommended activity: no lifting, driving, or strenuous exercise for 4 weeks. You should walk multiple times per day  Special Instructions  No straining, no heavy lifting > 10lbs x 4 weeks.  Keep incision area clean and dry. May shower in 2 days. No baths or pools for 6 weeks.  Please remove dressing tomorrow, no need to apply a bandage afterwards  You have sutures or staples that will be removed in clinic.   Please take pain medications as directed. Take a stool softener if on pain medications  *Regarding compression stockings-  Please wear day and night until you are walking a couple hundred feet three times a day.   Please Report any of the following: Nausea or Vomiting, Temperature is greater than 101.27F (38.1C) degrees, Dizziness, Abdominal Pain, Difficulty Breathing or Shortness of Breath, Inability to Eat, drink Fluids, or Take medications, Bleeding, swelling, or drainage from surgical incision sites, New numbness or weakness, and Bowel or bladder dysfunction to the neurosurgeon on call. How to contact us :  If you have any questions/concerns before or after surgery, you can reach us  at [phone], or you can send a mychart message. We can be reached by phone or mychart 8am-4pm, Monday-Friday.  *Please note: Calls after 4pm are forwarded to a third party answering service. Mychart messages are not routinely monitored during evenings, weekends, and holidays. Please call our office to contact the answering service for urgent concerns during non-business hours.   Additional Follow up appointments Please follow up with Glade Boys PA-C as scheduled in 2-3 weeks   Please see below for scheduled  appointments:  Future Appointments  Date Time Provider Department Center  03/08/2024  3:00 PM Boys Glade, [place] [facility] [place]  04/05/2024  2:45 PM Clois Fret, MD [facility]  05/17/2024  3:00 PM Boys Glade, PA-C [facility]  05/24/2024  3:35 PM Cleaver, Josefa HERO, NP [facility]   Home Health services setup with [facility]. Start of care will be 24 to 48 hours after discharge.

## 2024-02-22 NOTE — Anesthesia Preprocedure Evaluation (Addendum)
 Anesthesia Evaluation  Patient identified by MRN, date of birth, ID band Patient awake    Reviewed: Allergy & Precautions, H&P , NPO status , Patient's Chart, lab work & pertinent test results  Airway Mallampati: II  TM Distance: >3 FB Neck ROM: full    Dental no notable dental hx.    Pulmonary neg pulmonary ROS   Pulmonary exam normal        Cardiovascular Exercise Tolerance: Poor hypertension, + CAD and + CABG  Normal cardiovascular exam  LEFT HEART CATHETERIZATION AND CORONARY ANGIOGRAPHY performed on 10/10/2022 1. Severe 3 vessel obstructive CAD  Ost LM to Dist LM lesion is 50% stenosed.  Mid LAD lesion is 100% stenosed.  Ost LAD to Mid LAD lesion is 99% stenosed.  Ost Cx to Prox Cx lesion is 99% stenosed.  Origin lesion is 30% stenosed.  Origin to Insertion lesion between Ramus and 3rd Mrg  is 100% stenosed.  Origin to Insertion lesion is 100% stenosed.  3rd Mrg lesion is 100% stenosed. 2. Patent LIMA to the LAD 3. Patent RIMA to the PDA 4. Patent SVG to the ramus intermedius 5. Normal LVEDP 6. Plan: no change compared to prior cardiac cath in July 2023. Continue medical management.   NM PET CT CARDIAC PERFUSION MULTI W/ABSOLUTE BLOODFLOW performed on 02/11/2024 1. Small type 1 hiatal hernia. 2. Prior CABG. 3. Aortic atherosclerosis 4. LV perfusion is abnormal.  5. There is a medium defect with moderate to severe reduction in uptake present in the apical to mid anterior and anterolateral location(s) that is partially reversible. 6. There is abnormal wall motion in the defect area. Consistent with infarction and peri-infarct ischemia. 7. Rest left ventricular function is normal. Rest EF: 67%. Stress left ventricular function is normal. Stress EF: 68%. End diastolic cavity size is normal. 8. Myocardial blood flow reserve is not reported in this patient due to technical or patient-specific concerns that affect  accuracy. 9. Coronary calcium  assessment not performed due to prior revascularization. 10. Findings are consistent with infarction with peri-infarct ischemia. The study is intermediate risk.    ECHO 02/02/24:  1. Left ventricular ejection fraction, by estimation, is 55 to 60%. The  left ventricle has normal function. The left ventricle has no regional  wall motion abnormalities. Left ventricular diastolic parameters are  indeterminate.   2. Right ventricular systolic function is normal. The right ventricular  size is normal. Tricuspid regurgitation signal is inadequate for assessing  PA pressure.   3. The mitral valve is normal in structure. Trivial mitral valve  regurgitation. No evidence of mitral stenosis.   4. The aortic valve is normal in structure. Aortic valve regurgitation is  not visualized. No aortic stenosis is present.   5. The inferior vena cava is normal in size with greater than 50%  respiratory variability, suggesting right atrial pressure of 3 mmHg.     Neuro/Psych  PSYCHIATRIC DISORDERS Anxiety     Cervical myelopathy  Neuromuscular disease    GI/Hepatic Neg liver ROS,GERD  ,,  Endo/Other  negative endocrine ROS    Renal/GU Renal InsufficiencyRenal disease     Musculoskeletal   Abdominal  (+) + obese  Peds  Hematology negative hematology ROS (+)   Anesthesia Other Findings PAT note and cardiology clearance reviewed  Past Medical History: No date: Anxiety     Comment:  a.) on BZO (alprazolam ) PRN No date: Cervical myelopathy (HCC) No date: CKD (chronic kidney disease) stage 3, GFR 30-59 ml/min (HCC) 03/15/1999: Coronary artery disease  Comment:  a.) LHC 03/15/1999: multi-vessel CAD --> CVTS; b.) s/p               5v CABG 03/18/1999; c.) LHC 03/29/2003, 03/12/2006,               01/12/2007, 05/02/2010 - MV Dz with patent bypass grafts               x 5; d.) LHC 10/14/2019: MV Dz with CTO OM1; e.) LHC               11/11/2021: MV Dz with CTO  SVG-OM1 and SVG-D1; f.) NSTEMI              10/09/2022 --> LHC 10/10/2022: MV Dz with CTO OM1 and SVG              D1 - med mgmt No date: DDD (degenerative disc disease), thoracolumbar No date: Depression No date: Diverticulosis No date: Duodenal diverticulum No date: GERD (gastroesophageal reflux disease) No date: Gout No date: Hepatic steatosis No date: Hiatal hernia No date: History of cocaine abuse (HCC)     Comment:  last used in 2015 per pt (as of 02-15-24) No date: Hyperlipidemia No date: Hypertension No date: Hypothyroidism No date: Insomnia     Comment:  a.) on hypnotic (zolpidem ) PRN No date: Long-term use of aspirin  therapy 10/09/2022: NSTEMI (non-ST elevated myocardial infarction) (HCC) 03/18/1999: S/P CABG x 5     Comment:  a.) LIMA-LAD, RIMA-RCA, SVG-OM1, SVG-D1, SVG-RI No date: Spinal stenosis of cervical region with radiculopathy No date: Umbilical hernia 11/11/2021: Unstable angina [facility])  Past Surgical History: 05/25/2017: COLONOSCOPY WITH PROPOFOL ; N/A     Comment:  Procedure: COLONOSCOPY WITH PROPOFOL ;  Surgeon: Therisa Bi, MD;  Location: [facility] ENDOSCOPY;  Service:               Gastroenterology;  Laterality: N/A; 03/18/1999: CORONARY ARTERY BYPASS GRAFT; N/A     Comment:  Procedure: CORONARY ARTERY BYPASS GRAFT; Location: [facility] 10/27/2017: ESOPHAGOGASTRODUODENOSCOPY; N/A     Comment:  Procedure: ESOPHAGOGASTRODUODENOSCOPY (EGD);  Surgeon:               Unk Corinn Skiff, MD;  Location: [facility] ENDOSCOPY;                Service: Gastroenterology;  Laterality: N/A; 11/19/2017: ESOPHAGOGASTRODUODENOSCOPY (EGD) WITH PROPOFOL ; N/A     Comment:  Procedure: ESOPHAGOGASTRODUODENOSCOPY (EGD) WITH               PROPOFOL ;  Surgeon: Unk Corinn Skiff, MD;  Location:               [facility] ENDOSCOPY;  Service: Gastroenterology;  Laterality:               N/A; 03/15/1999: LEFT HEART CATH AND CORONARY ANGIOGRAPHY; Left     Comment:   Procedure: LEFT HEART CATH AND CORONARY ANGIOGRAPHY;               Location: [facility] 10/14/2019: LEFT HEART CATH AND CORS/GRAFTS ANGIOGRAPHY; N/A     Comment:  Procedure: LEFT HEART CATH AND CORS/GRAFTS ANGIOGRAPHY;               Surgeon: Claudene Victory ORN, MD;  [facility]: [facility] INVASIVE CV               LAB;  Service: Cardiovascular;  Laterality: N/A; 11/11/2021: LEFT HEART CATH AND CORS/GRAFTS ANGIOGRAPHY; N/A     Comment:  Procedure: LEFT HEART CATH AND CORS/GRAFTS ANGIOGRAPHY;               Surgeon: Mady Bruckner, MD;  Location: [facility] INVASIVE CV               LAB;  Service: Cardiovascular;  Laterality: N/A; 10/10/2022: LEFT HEART CATH AND CORS/GRAFTS ANGIOGRAPHY; N/A     Comment:  Procedure: LEFT HEART CATH AND CORS/GRAFTS ANGIOGRAPHY;               Surgeon: Swaziland, Peter M, MD;  [facility]: [facility] INVASIVE CV               LAB;  Service: Cardiovascular;  Laterality: N/A; 03/29/2003: LEFT HEART CATH AND CORS/GRAFTS ANGIOGRAPHY; Left     Comment:  Procedure: LEFT HEART CATH AND CORS/GRAFTS ANGIOGRAPHY;               Location: [facility]; Surgeon: Vinie Jude, MD 03/12/2006: LEFT HEART CATH AND CORS/GRAFTS ANGIOGRAPHY; Left     Comment:  Procedure: LEFT HEART CATH AND CORS/GRAFTS ANGIOGRAPHY;               Location: [facility]; Surgeon: Wolm Rhyme, MD 01/12/2007: LEFT HEART CATH AND CORS/GRAFTS ANGIOGRAPHY; Left     Comment:  Procedure: LEFT HEART CATH AND CORS/GRAFTS ANGIOGRAPHY;               Location: [facility]; Surgeon: Wolm Rhyme, MD 05/02/2010: LEFT HEART CATH AND CORS/GRAFTS ANGIOGRAPHY; Left     Comment:  Procedure: LEFT HEART CATH AND CORS/GRAFTS ANGIOGRAPHY;               Location: [facility]; Surgeon: Marsa Dooms, MD     Reproductive/Obstetrics negative OB ROS                              Anesthesia Physical Anesthesia Plan  ASA: 3  Anesthesia Plan: General ETT   Post-op Pain Management: Ofirmev  IV (intra-op)* and Regional block*   Induction:  Intravenous  PONV Risk Score and Plan: 2 and Ondansetron , Dexamethasone and Midazolam   Airway Management Planned: Oral ETT  Additional Equipment:   Intra-op Plan:   Post-operative Plan: Extubation in OR  Informed Consent: I have reviewed the patients History and Physical, chart, labs and discussed the procedure including the risks, benefits and alternatives for the proposed anesthesia with the patient or authorized representative who has indicated his/her understanding and acceptance.     Dental Advisory Given  Plan Discussed with: CRNA and Surgeon  Anesthesia Plan Comments:          Anesthesia Quick Evaluation

## 2024-02-22 NOTE — Transfer of Care (Signed)
 Immediate Anesthesia Transfer of Care Note  Patient: Billy Cisneros.  Procedure(s) Performed: CERVICAL LAMINOPLASTY (Spine Cervical)  Patient Location: PACU  Anesthesia Type:General  Level of Consciousness: awake and drowsy  Airway & Oxygen Therapy: Patient Spontanous Breathing and Patient connected to face mask oxygen  Post-op Assessment: Report given to RN and Post -op Vital signs reviewed and stable  Post vital signs: Reviewed and stable  Last Vitals:  Vitals Value Taken Time  BP 114/75 02/22/24 15:40  Temp 36.6 C 02/22/24 15:40  Pulse 100 02/22/24 15:40  Resp 19 02/22/24 15:40  SpO2 98 % 02/22/24 15:40  Vitals shown include unfiled device data.  Last Pain:  Vitals:   02/22/24 1210  TempSrc: Temporal  PainSc: 0-No pain         Complications: No notable events documented.

## 2024-02-22 NOTE — Anesthesia Procedure Notes (Signed)
 Procedure Name: Intubation Date/Time: 02/22/2024 1:02 PM  Performed by: Trudy Rankin LABOR, CRNAPre-anesthesia Checklist: Patient identified, Patient being monitored, Timeout performed, Emergency Drugs available and Suction available Patient Re-evaluated:Patient Re-evaluated prior to induction Oxygen Delivery Method: Circle System Utilized Preoxygenation: Pre-oxygenation with 100% oxygen Induction Type: IV induction and Rapid sequence Laryngoscope Size: Mac, 3 and McGrath Grade View: Grade I Tube type: Oral Tube size: 7.5 mm Number of attempts: 1 Airway Equipment and Method: Stylet Placement Confirmation: ETT inserted through vocal cords under direct vision, positive ETCO2 and breath sounds checked- equal and bilateral Secured at: 20 cm Tube secured with: Tape Dental Injury: Teeth and Oropharynx as per pre-operative assessment

## 2024-02-23 ENCOUNTER — Encounter: Payer: Self-pay | Admitting: Neurosurgery

## 2024-02-23 ENCOUNTER — Inpatient Hospital Stay

## 2024-02-23 LAB — COMPREHENSIVE METABOLIC PANEL WITH GFR
ALT: 33 U/L (ref 0–44)
AST: 40 U/L (ref 15–41)
Albumin: 3.4 g/dL — ABNORMAL LOW (ref 3.5–5.0)
Alkaline Phosphatase: 48 U/L (ref 38–126)
Anion gap: 12 (ref 5–15)
BUN: 42 mg/dL — ABNORMAL HIGH (ref 8–23)
CO2: 25 mmol/L (ref 22–32)
Calcium: 8.5 mg/dL — ABNORMAL LOW (ref 8.9–10.3)
Chloride: 99 mmol/L (ref 98–111)
Creatinine, Ser: 1.82 mg/dL — ABNORMAL HIGH (ref 0.61–1.24)
GFR, Estimated: 39 mL/min — ABNORMAL LOW (ref >60)
Glucose, Bld: 163 mg/dL — ABNORMAL HIGH (ref 70–99)
Potassium: 4.7 mmol/L (ref 3.5–5.1)
Sodium: 136 mmol/L (ref 135–145)
Total Bilirubin: 0.8 mg/dL (ref 0.0–1.2)
Total Protein: 6 g/dL — ABNORMAL LOW (ref 6.5–8.1)

## 2024-02-23 LAB — CBC
HCT: 29.9 % — ABNORMAL LOW (ref 39.0–52.0)
Hemoglobin: 10.4 g/dL — ABNORMAL LOW (ref 13.0–17.0)
MCH: 31.6 pg (ref 26.0–34.0)
MCHC: 34.8 g/dL (ref 30.0–36.0)
MCV: 90.9 fL (ref 80.0–100.0)
Platelets: 267 K/uL (ref 150–400)
RBC: 3.29 MIL/uL — ABNORMAL LOW (ref 4.22–5.81)
RDW: 13.1 % (ref 11.5–15.5)
WBC: 14.5 K/uL — ABNORMAL HIGH (ref 4.0–10.5)
nRBC: 0 % (ref 0.0–0.2)

## 2024-02-23 LAB — APTT: aPTT: 28 s (ref 24–36)

## 2024-02-23 LAB — PROTIME-INR
INR: 1.1 (ref 0.8–1.2)
Prothrombin Time: 14.5 s (ref 11.4–15.2)

## 2024-02-23 MED ORDER — CALCIUM CARBONATE ANTACID 500 MG PO CHEW
1.0000 | CHEWABLE_TABLET | Freq: Three times a day (TID) | ORAL | Status: DC
  Administered 2024-02-23 – 2024-02-27 (×11): 200 mg via ORAL
  Filled 2024-02-23 (×11): qty 1

## 2024-02-23 MED ORDER — FAMOTIDINE IN NACL 20-0.9 MG/50ML-% IV SOLN
20.0000 mg | INTRAVENOUS | Status: DC
  Administered 2024-02-23: 20 mg via INTRAVENOUS
  Filled 2024-02-23: qty 50

## 2024-02-23 MED ORDER — SODIUM CHLORIDE 0.9 % IV SOLN: INTRAVENOUS | Status: DC

## 2024-02-23 MED ORDER — SODIUM CHLORIDE 0.9 % IV SOLN
12.5000 mg | Freq: Four times a day (QID) | INTRAVENOUS | Status: DC | PRN
  Administered 2024-02-23: 12.5 mg via INTRAVENOUS
  Filled 2024-02-23: qty 12.5

## 2024-02-23 MED ORDER — SODIUM CHLORIDE 0.9 % IV SOLN
12.5000 mg | INTRAVENOUS | Status: DC
  Filled 2024-02-23: qty 0.5
  Filled 2024-02-23: qty 12.5
  Filled 2024-02-23: qty 0.5

## 2024-02-23 MED ORDER — SCOPOLAMINE 1 MG/3DAYS TD PT72
1.0000 | MEDICATED_PATCH | TRANSDERMAL | Status: DC
  Administered 2024-02-23: 1 mg via TRANSDERMAL
  Filled 2024-02-23: qty 1

## 2024-02-23 MED ORDER — HYDROMORPHONE HCL 1 MG/ML IJ SOLN
0.5000 mg | INTRAMUSCULAR | Status: DC | PRN
  Administered 2024-02-23: 0.5 mg via INTRAVENOUS
  Filled 2024-02-23: qty 0.5

## 2024-02-23 MED ORDER — SODIUM CHLORIDE 0.9 % IV SOLN: 10.0000 mg | Freq: Two times a day (BID) | INTRAVENOUS | Status: DC

## 2024-02-23 MED ORDER — SODIUM CHLORIDE 0.9 % IV SOLN
12.5000 mg | INTRAVENOUS | Status: DC | PRN
  Administered 2024-02-23: 12.5 mg via INTRAVENOUS

## 2024-02-23 MED ORDER — SIMETHICONE 80 MG PO CHEW
80.0000 mg | CHEWABLE_TABLET | Freq: Four times a day (QID) | ORAL | Status: DC | PRN
  Filled 2024-02-23: qty 1

## 2024-02-23 NOTE — Progress Notes (Signed)
  Chaplain On-Call responded to Spiritual Care Consult Order from Billy Daisy, MD. The request was for Advance Directives information for the patient.  Chaplain met the patient and his wife [name].  Chaplain provided the AD documents and education to them. The patient stated his desire that [name] be his HCPOA. Chaplain explained the process for completion of the documents while in the hospital if the patient chooses that. The patient stated his understanding.  Chaplain Bebe Ardean EMERSON Hershal., [facility]

## 2024-02-23 NOTE — Anesthesia Postprocedure Evaluation (Signed)
 Anesthesia Post Note  Patient: Billy Cisneros.  Procedure(s) Performed: CERVICAL LAMINOPLASTY (Spine Cervical)  Patient location during evaluation: PACU Anesthesia Type: General Level of consciousness: awake and alert Pain management: pain level controlled Vital Signs Assessment: post-procedure vital signs reviewed and stable Respiratory status: spontaneous breathing, nonlabored ventilation and respiratory function stable Cardiovascular status: blood pressure returned to baseline and stable Postop Assessment: no apparent nausea or vomiting Anesthetic complications: no   No notable events documented.   Last Vitals:  Vitals:   02/23/24 1132 02/23/24 1616  BP: 104/71 108/66  Pulse: 96 (!) 104  Resp:  17  Temp:  36.8 C  SpO2: 95% 95%    Last Pain:  Vitals:   02/23/24 1133  TempSrc:   PainSc: 8                  Camellia Merilee Louder

## 2024-02-23 NOTE — TOC Initial Note (Signed)
 Transition of Care (TOC) - Initial/Assessment Note    Patient Details  Name: Billy Cisneros. MRN: [id number] Date of Birth: 11/26/1950  Transition of [facility]) CM/SW Contact:    Alvaro Louder, LCSW Phone Number: 02/23/2024, 4:23 PM  Clinical Narrative:        Per Chart review patient from home PCP is Ophelia Sage LCSWA Faxed out information to [facility] agencies in [place]. Patient selected HH agency [facility].   TOC to follow for discharge       Patient Goals and CMS Choice            Expected Discharge Plan and Services                                              Prior Living Arrangements/Services                       Activities of Daily Living   ADL Screening (condition at time of admission) Independently performs ADLs?: Yes (appropriate for developmental age) Is the patient deaf or have difficulty hearing?: No Does the patient have difficulty seeing, even when wearing glasses/contacts?: No Does the patient have difficulty concentrating, remembering, or making decisions?: No  Permission Sought/Granted                  Emotional Assessment              Admission diagnosis:  Cervical myelopathy (HCC) [G95.9] Cervical spinal stenosis [M48.02] Patient Active Problem List   Diagnosis Date Noted   Cervical myelopathy (HCC) 02/22/2024   Cervical spinal stenosis 02/22/2024   NSTEMI (non-ST elevated myocardial infarction) (HCC) 10/09/2022   Unstable angina (HCC) 11/11/2021   Esophageal dysphagia    Esophageal obstruction due to food impaction    CKD (chronic kidney disease), stage III 09/05/2017   Acute gout 02/19/2016   GERD (gastroesophageal reflux disease) 01/21/2016   Pain and swelling of right wrist 08/13/2015   Productive cough 08/13/2015   Chronic right shoulder pain 06/12/2015   Cannot sleep 06/12/2015   Hyperlipidemia LDL goal <70 03/12/2015   Cervical radiculopathy 01/08/2015   Annual physical exam 01/01/2015    Neck pain on left side 01/01/2015   H/O coronary artery bypass surgery 12/11/2014   Essential hypertension 12/04/2014   Anxiety 12/04/2014   Chest pain 12/04/2014   Major depression 11/21/2011   PCP:  Sage Ophelia JINNY DOUGLAS, MD Pharmacy:   [facility] [address] [facility] - [place], [place] - [address] AT [facility] [address] [place] [address] Phone: [phone] Fax: [phone]  [facility]) - [place], [place] - [address] [facility] [address] [place] [address] [place] [address] Phone: [phone] Fax: [phone]     Social Drivers of Health (SDOH) Social History: SDOH Screenings   Food Insecurity: No Food Insecurity (02/22/2024)  Housing: Low Risk  (02/22/2024)  Transportation Needs: No Transportation Needs (02/22/2024)  Utilities: Patient Declined (02/22/2024)  Financial Resource Strain: Low Risk  (08/11/2023)   Received from [facility]  Social Connections: Patient Declined (02/22/2024)  Tobacco Use: Low Risk  (02/22/2024)   SDOH Interventions:     Readmission Risk Interventions     No data to display

## 2024-02-23 NOTE — Plan of Care (Signed)
  Problem: Education: Goal: Knowledge of General Education information will improve Description: Including pain rating scale, medication(s)/side effects and non-pharmacologic comfort measures Outcome: Progressing   Problem: Health Behavior/Discharge Planning: Goal: Ability to manage health-related needs will improve Outcome: Progressing   Problem: Clinical Measurements: Goal: Ability to maintain clinical measurements within normal limits will improve Outcome: Progressing Goal: Will remain free from infection Outcome: Progressing Goal: Diagnostic test results will improve Outcome: Progressing Goal: Respiratory complications will improve Outcome: Progressing Goal: Cardiovascular complication will be avoided Outcome: Progressing   Problem: Activity: Goal: Risk for activity intolerance will decrease Outcome: Progressing   Problem: Nutrition: Goal: Adequate nutrition will be maintained Outcome: Progressing   Problem: Coping: Goal: Level of anxiety will decrease Outcome: Progressing   Problem: Elimination: Goal: Will not experience complications related to bowel motility Outcome: Progressing Goal: Will not experience complications related to urinary retention Outcome: Progressing   Problem: Pain Managment: Goal: General experience of comfort will improve and/or be controlled Outcome: Progressing   Problem: Safety: Goal: Ability to remain free from injury will improve Outcome: Progressing   Problem: Skin Integrity: Goal: Risk for impaired skin integrity will decrease Outcome: Progressing   Problem: Education: Goal: Ability to verbalize activity precautions or restrictions will improve Outcome: Progressing Goal: Knowledge of the prescribed therapeutic regimen will improve Outcome: Progressing Goal: Understanding of discharge needs will improve Outcome: Progressing   Problem: Activity: Goal: Ability to avoid complications of mobility impairment will  improve Outcome: Progressing Goal: Ability to tolerate increased activity will improve Outcome: Progressing Goal: Will remain free from falls Outcome: Progressing

## 2024-02-23 NOTE — Progress Notes (Signed)
   Neurosurgery Progress Note  History: Billy Cisneros. Is s/p C3-7 laminoplasty   POD1: Pt experiencing urinary retention this morning requiring I&O cath. Reported expected pain in his shoulders but is otherwise doing well.   Physical Exam: Vitals:   02/23/24 0158 02/23/24 0625  BP: 102/72 129/81  Pulse: 87 83  Resp: 17 17  Temp: 97.8 F (36.6 C) 97.8 F (36.6 C)  SpO2: 92% 93%    AA Ox3 CNI  Strength: Side Biceps Triceps Deltoid Interossei Grip Wrist Ext. Wrist Flex.  R 5 4+ 5 5 4+ 5 5  L 5 4+ 5 5 4+ 5 5    Side Iliopsoas Quads Hamstring PF DF EHL  R 5 5 5 5 5 5   L 5 5 5 5 5 5     HV output 70 since surgery   Data:  Other tests/results:  Lab Results  Component Value Date   CREATININE 1.75 (H) 02/22/2024   CREATININE 1.43 (H) 10/11/2022   CREATININE 1.56 (H) 10/10/2022     Assessment/Plan:  Billy Cisneros. Is a 73 y.o presenting with cervical stenosis and myelopathy s/p laminoplasty   - mobilize - pain control - DVT prophylaxis - will continue to monitor HV for now and re-evaluate removal this afternoon - PTOT; dispo planning underway   Edsel Goods PA-C Department of Neurosurgery

## 2024-02-23 NOTE — Progress Notes (Signed)
 Patient was unable to void despite multiple attempts. Bladder scan revealed a volume of over 700 mL. An order for an in-and-out catheterization was obtained and the procedure will be performed as ordered.

## 2024-02-23 NOTE — Evaluation (Signed)
 Occupational Therapy Evaluation Patient Details Name: Billy Cisneros. MRN: [id number] DOB: [birth year] Today's Date: 02/23/2024   History of Present Illness   [name]. Is s/p C3-7 laminoplasty     Clinical Impressions Patient presenting with decreased Ind in self care,balance, functional mobility/transfers, endurance, and safety awareness. Patient reports living at home with wife and being mod I at baseline. Pt endorses pain in neck and B shoulders. OT educated pt and wife on precautions related to mobility and self care tasks. Pt demonstrated ability to perform figure four position to don underwear with min A for sit <> stand to pull over B hips. Pt is noted to have bloody dressing at neck and RN notified. Pt remains seated in recliner chair at end of session. Increased pain this session.  Patient will benefit from acute OT to increase overall independence in the areas of ADLs, functional mobility, and safety awareness in order to safely discharge.     If plan is discharge home, recommend the following:   A little help with walking and/or transfers;A little help with bathing/dressing/bathroom;Assistance with cooking/housework;Assist for transportation;Help with stairs or ramp for entrance     Functional Status Assessment   Patient has had a recent decline in their functional status and demonstrates the ability to make significant improvements in function in a reasonable and predictable amount of time.     Equipment Recommendations   None recommended by OT      Precautions/Restrictions   Precautions Precautions: Cervical Recall of Precautions/Restrictions: Intact     Mobility Bed Mobility               General bed mobility comments: seated in recliner chair at end of session    Transfers Overall transfer level: Needs assistance Equipment used: 1 person hand held assist Transfers: Sit to/from Stand Sit to Stand: Min assist                   Balance Overall balance assessment: Needs assistance Sitting-balance support: Feet supported, Single extremity supported Sitting balance-Leahy Scale: Good     Standing balance support: Single extremity supported Standing balance-Leahy Scale: Fair                             ADL either performed or assessed with clinical judgement   ADL Overall ADL's : Needs assistance/impaired                     Lower Body Dressing: Contact guard assist;Sit to/from stand Lower Body Dressing Details (indicate cue type and reason): with cues for figure four position                     Vision Patient Visual Report: No change from baseline              Pertinent Vitals/Pain Pain Assessment Pain Assessment: Faces Faces Pain Scale: Hurts even more Pain Location: neck and bilateral shoulders Pain Descriptors / Indicators: Dull, Aching, Sore, Discomfort Pain Intervention(s): Limited activity within patient's tolerance, Monitored during session, Repositioned, Premedicated before session     Extremity/Trunk Assessment Upper Extremity Assessment Upper Extremity Assessment: Generalized weakness   Lower Extremity Assessment Lower Extremity Assessment: Generalized weakness   Cervical / Trunk Assessment Cervical / Trunk Assessment: Normal   Communication Communication Communication: Impaired Factors Affecting Communication: Hearing impaired   Cognition Arousal: Alert Behavior During Therapy: WFL for tasks assessed/performed Cognition: No apparent impairments  Following commands: Intact       Cueing  General Comments   Cueing Techniques: Verbal cues              Home Living Family/patient expects to be discharged to:: Private residence Living Arrangements: Spouse/significant other Available Help at Discharge: Family Type of Home: Mobile home Home Access: Stairs to enter Entrance Stairs-Number of  Steps: 9 Entrance Stairs-Rails: Right;Left;Can reach both Home Layout: One level     Bathroom Shower/Tub: Producer, television/film/video: Handicapped height Bathroom Accessibility: Yes   Home Equipment: Agricultural consultant (2 wheels);Cane - single point          Prior Functioning/Environment Prior Level of Function : Independent/Modified Independent;Driving             Mobility Comments: use cane and occassional walker ADLs Comments: Ind with self care and IADLs    OT Problem List: Decreased strength;Decreased cognition;Decreased safety awareness;Decreased activity tolerance;Impaired balance (sitting and/or standing);Decreased knowledge of precautions   OT Treatment/Interventions: Self-care/ADL training;Therapeutic activities;Therapeutic exercise;Energy conservation;Patient/family education;Balance training;DME and/or AE instruction      OT Goals(Current goals can be found in the care plan section)   Acute Rehab OT Goals Patient Stated Goal: to decrease pain and go home OT Goal Formulation: With patient/family Time For Goal Achievement: 03/08/24 Potential to Achieve Goals: Fair ADL Goals Pt Will Perform Grooming: with modified independence;standing Pt Will Perform Lower Body Dressing: with modified independence;sit to/from stand Pt Will Transfer to Toilet: with modified independence;ambulating Pt Will Perform Toileting - Clothing Manipulation and hygiene: with modified independence;sit to/from stand   OT Frequency:  Min 2X/week       AM-PAC OT 6 Clicks Daily Activity     Outcome Measure Help from another person eating meals?: None Help from another person taking care of personal grooming?: None Help from another person toileting, which includes using toliet, bedpan, or urinal?: A Little Help from another person bathing (including washing, rinsing, drying)?: A Little Help from another person to put on and taking off regular upper body clothing?: None Help from  another person to put on and taking off regular lower body clothing?: A Little 6 Click Score: 21   End of Session Equipment Utilized During Treatment: Rolling walker (2 wheels) Nurse Communication: Mobility status  Activity Tolerance: Patient tolerated treatment well Patient left: in chair;with call bell/phone within reach;with family/visitor present  OT Visit Diagnosis: Unsteadiness on feet (R26.81);Repeated falls (R29.6);Muscle weakness (generalized) (M62.81)                Time: 1030-1055 OT Time Calculation (min): 25 min Charges:  OT General Charges $OT Visit: 1 Visit OT Evaluation $OT Eval Low Complexity: 1 Low OT Treatments $Self Care/Home Management : 8-22 mins  Izetta Claude, MS, OTR/L , CBIS ascom [phone]  02/23/24, 1:07 PM

## 2024-02-23 NOTE — Evaluation (Signed)
 Physical Therapy Evaluation Patient Details Name: Billy Cisneros. MRN: [id number] DOB: Aug 29, 1950 Today's Date: 02/23/2024  History of Present Illness  Billy Cisneros. Is s/p C3-7 laminoplasty  Clinical Impression  Patient seen for initial PT evaluation due to decline in functional status. Patient is A&O x 4. Baseline mobility reported as modI with occasional use of RW although he used the cane more often, currently requiring minA for tranfers and minA with RW for hallway ambulation 125 feet. Pt required vc for walking posture and was generally slow to move with OOB mobility. Pt has 9 STE mobile home with rails on both side and has available assistance from wife who's retired at d/c.  Clinical impression: patient presents with mild to moderate mobility limitations secondary to laminoplasty and chronic neck pain with neurological deficits. Recommend skilled PT to address safety, mobility, and discharge planning.        If plan is discharge home, recommend the following: A little help with walking and/or transfers;A little help with bathing/dressing/bathroom   Can travel by private vehicle        Equipment Recommendations Rollator (4 wheels)  Recommendations for Other Services       Functional Status Assessment Patient has had a recent decline in their functional status and demonstrates the ability to make significant improvements in function in a reasonable and predictable amount of time.     Precautions / Restrictions Precautions Precautions: Cervical Precaution Booklet Issued: Yes (comment) Recall of Precautions/Restrictions: Intact      Mobility  Bed Mobility Overal bed mobility: Needs Assistance Bed Mobility: Rolling, Sidelying to Sit, Supine to Sit Rolling: Min assist, Contact guard assist Sidelying to sit: Min assist, Contact guard assist Supine to sit: Min assist, Contact guard     General bed mobility comments: improved throught session     Transfers Overall transfer level: Needs assistance Equipment used: Rolling walker (2 wheels) Transfers: Sit to/from Stand Sit to Stand: Mod assist                Ambulation/Gait Ambulation/Gait assistance: Contact guard assist, Supervision Gait Distance (Feet): 125 Feet Assistive device: Rolling walker (2 wheels) Gait Pattern/deviations: Step-through pattern, Decreased stride length, Shuffle, Narrow base of support, Trunk flexed       General Gait Details: vc for upright posturing and proper DME use  Stairs            Wheelchair Mobility     Tilt Bed    Modified Rankin (Stroke Patients Only)       Balance Overall balance assessment: Needs assistance Sitting-balance support: Feet supported, Single extremity supported Sitting balance-Leahy Scale: Good       Standing balance-Leahy Scale: Good Standing balance comment: mild unsteady and use RW for balance throughout session.                             Pertinent Vitals/Pain Pain Assessment Pain Assessment: Faces Faces Pain Scale: Hurts a little bit Facial Expression: Relaxed, neutral Body Movements: Protection Muscle Tension: Tense, rigid Compliance with ventilator (intubated pts.): N/A Vocalization (extubated pts.): Talking in normal tone or no sound CPOT Total: 2 Pain Location: neck and bilateral shoulders Pain Descriptors / Indicators: Dull, Aching Pain Intervention(s): Limited activity within patient's tolerance, Monitored during session    Home Living Family/patient expects to be discharged to:: Private residence Living Arrangements: Spouse/significant other Available Help at Discharge: Family Type of Home: Mobile home Home Access: Stairs to enter  Entrance Stairs-Rails: Right;Left;Can reach both Entrance Stairs-Number of Steps: 9   Home Layout: One level Home Equipment: Agricultural consultant (2 wheels)      Prior Function Prior Level of Function : Independent/Modified  Independent             Mobility Comments: use cane and occassional walker       Extremity/Trunk Assessment        Lower Extremity Assessment Lower Extremity Assessment: Generalized weakness    Cervical / Trunk Assessment Cervical / Trunk Assessment: Normal  Communication   Communication Communication: No apparent difficulties Factors Affecting Communication: Hearing impaired    Cognition Arousal: Alert Behavior During Therapy: WFL for tasks assessed/performed   PT - Cognitive impairments: No apparent impairments                         Following commands: Intact       Cueing       General Comments      Exercises     Assessment/Plan    PT Assessment Patient needs continued PT services  PT Problem List Decreased strength;Decreased range of motion;Decreased activity tolerance;Decreased mobility;Decreased coordination;Pain       PT Treatment Interventions Gait training;DME instruction;Functional mobility training;Therapeutic activities;Therapeutic exercise;Neuromuscular re-education;Balance training    PT Goals (Current goals can be found in the Care Plan section)  Acute Rehab PT Goals Patient Stated Goal: Pt. reports that he wants to get stronger PT Goal Formulation: With patient/family Time For Goal Achievement: 03/08/24 Potential to Achieve Goals: Good    Frequency Min 4X/week     Co-evaluation               AM-PAC PT 6 Clicks Mobility  Outcome Measure Help needed turning from your back to your side while in a flat bed without using bedrails?: A Little Help needed moving from lying on your back to sitting on the side of a flat bed without using bedrails?: A Little Help needed moving to and from a bed to a chair (including a wheelchair)?: A Little Help needed standing up from a chair using your arms (e.g., wheelchair or bedside chair)?: A Little Help needed to walk in hospital room?: A Little Help needed climbing 3-5 steps  with a railing? : A Lot 6 Click Score: 17    End of Session Equipment Utilized During Treatment: Gait belt Activity Tolerance: Patient tolerated treatment well Patient left: in chair;with chair alarm set Nurse Communication: Mobility status PT Visit Diagnosis: Unsteadiness on feet (R26.81);Muscle weakness (generalized) (M62.81)    Time: 9071-9046 PT Time Calculation (min) (ACUTE ONLY): 25 min   Charges:   PT Evaluation $PT Eval Low Complexity: 1 Low   PT General Charges $$ ACUTE PT VISIT: 1 Visit         Sherlean Lesches DPT, PT    Stassi Fadely A Aneka Fagerstrom 02/23/2024, 10:25 AM

## 2024-02-24 ENCOUNTER — Inpatient Hospital Stay

## 2024-02-24 ENCOUNTER — Encounter: Payer: Self-pay | Admitting: Neurosurgery

## 2024-02-24 DIAGNOSIS — K910 Vomiting following gastrointestinal surgery: Secondary | ICD-10-CM

## 2024-02-24 DIAGNOSIS — R079 Chest pain, unspecified: Secondary | ICD-10-CM

## 2024-02-24 DIAGNOSIS — K920 Hematemesis: Secondary | ICD-10-CM | POA: Diagnosis not present

## 2024-02-24 DIAGNOSIS — R739 Hyperglycemia, unspecified: Secondary | ICD-10-CM

## 2024-02-24 DIAGNOSIS — A419 Sepsis, unspecified organism: Secondary | ICD-10-CM

## 2024-02-24 DIAGNOSIS — R0682 Tachypnea, not elsewhere classified: Secondary | ICD-10-CM

## 2024-02-24 DIAGNOSIS — R112 Nausea with vomiting, unspecified: Secondary | ICD-10-CM

## 2024-02-24 DIAGNOSIS — J9601 Acute respiratory failure with hypoxia: Secondary | ICD-10-CM

## 2024-02-24 DIAGNOSIS — D509 Iron deficiency anemia, unspecified: Secondary | ICD-10-CM

## 2024-02-24 DIAGNOSIS — N179 Acute kidney failure, unspecified: Secondary | ICD-10-CM

## 2024-02-24 DIAGNOSIS — R000 Tachycardia, unspecified: Secondary | ICD-10-CM

## 2024-02-24 DIAGNOSIS — J690 Pneumonitis due to inhalation of food and vomit: Secondary | ICD-10-CM

## 2024-02-24 DIAGNOSIS — N183 Chronic kidney disease, stage 3 unspecified: Secondary | ICD-10-CM

## 2024-02-24 DIAGNOSIS — R412 Retrograde amnesia: Secondary | ICD-10-CM

## 2024-02-24 DIAGNOSIS — G9341 Metabolic encephalopathy: Secondary | ICD-10-CM

## 2024-02-24 DIAGNOSIS — G959 Disease of spinal cord, unspecified: Secondary | ICD-10-CM | POA: Diagnosis not present

## 2024-02-24 DIAGNOSIS — K219 Gastro-esophageal reflux disease without esophagitis: Secondary | ICD-10-CM

## 2024-02-24 LAB — URINALYSIS, ROUTINE W REFLEX MICROSCOPIC
Bacteria, UA: NONE SEEN
Bilirubin Urine: NEGATIVE
Glucose, UA: NEGATIVE mg/dL
Ketones, ur: NEGATIVE mg/dL
Leukocytes,Ua: NEGATIVE
Nitrite: NEGATIVE
Protein, ur: NEGATIVE mg/dL
Specific Gravity, Urine: 1.021 (ref 1.005–1.030)
Squamous Epithelial / HPF: 0 /HPF (ref 0–5)
pH: 5 (ref 5.0–8.0)

## 2024-02-24 LAB — BLOOD GAS, VENOUS: Patient temperature: 37

## 2024-02-24 LAB — COMPREHENSIVE METABOLIC PANEL WITH GFR
ALT: 26 U/L (ref 0–44)
AST: 34 U/L (ref 15–41)
Albumin: 3.3 g/dL — ABNORMAL LOW (ref 3.5–5.0)
Alkaline Phosphatase: 38 U/L (ref 38–126)
Anion gap: 11 (ref 5–15)
BUN: 36 mg/dL — ABNORMAL HIGH (ref 8–23)
CO2: 21 mmol/L — ABNORMAL LOW (ref 22–32)
Calcium: 8 mg/dL — ABNORMAL LOW (ref 8.9–10.3)
Chloride: 104 mmol/L (ref 98–111)
Creatinine, Ser: 1.52 mg/dL — ABNORMAL HIGH (ref 0.61–1.24)
GFR, Estimated: 49 mL/min — ABNORMAL LOW (ref >60)
Glucose, Bld: 121 mg/dL — ABNORMAL HIGH (ref 70–99)
Potassium: 4.6 mmol/L (ref 3.5–5.1)
Sodium: 136 mmol/L (ref 135–145)
Total Bilirubin: 0.7 mg/dL (ref 0.0–1.2)
Total Protein: 5.6 g/dL — ABNORMAL LOW (ref 6.5–8.1)

## 2024-02-24 LAB — TROPONIN I (HIGH SENSITIVITY)
Troponin I (High Sensitivity): 12 ng/L (ref <18)
Troponin I (High Sensitivity): 14 ng/L (ref <18)
Troponin I (High Sensitivity): 70 ng/L — ABNORMAL HIGH (ref <18)
Troponin I (High Sensitivity): 97 ng/L — ABNORMAL HIGH (ref <18)

## 2024-02-24 LAB — GLUCOSE, CAPILLARY
Glucose-Capillary: 116 mg/dL — ABNORMAL HIGH (ref 70–99)
Glucose-Capillary: 116 mg/dL — ABNORMAL HIGH (ref 70–99)

## 2024-02-24 LAB — CBC
HCT: 29.1 % — ABNORMAL LOW (ref 39.0–52.0)
Hemoglobin: 9.7 g/dL — ABNORMAL LOW (ref 13.0–17.0)
MCH: 30.8 pg (ref 26.0–34.0)
MCHC: 33.3 g/dL (ref 30.0–36.0)
MCV: 92.4 fL (ref 80.0–100.0)
Platelets: 214 K/uL (ref 150–400)
RBC: 3.15 MIL/uL — ABNORMAL LOW (ref 4.22–5.81)
RDW: 13.4 % (ref 11.5–15.5)
WBC: 10.2 K/uL (ref 4.0–10.5)
nRBC: 0 % (ref 0.0–0.2)

## 2024-02-24 LAB — IRON AND TIBC
Iron: 34 ug/dL — ABNORMAL LOW (ref 45–182)
Saturation Ratios: 12 % — ABNORMAL LOW (ref 17.9–39.5)
TIBC: 290 ug/dL (ref 250–450)
UIBC: 256 ug/dL

## 2024-02-24 LAB — HEMOGLOBIN
Hemoglobin: 8 g/dL — ABNORMAL LOW (ref 13.0–17.0)
Hemoglobin: 8.3 g/dL — ABNORMAL LOW (ref 13.0–17.0)
Hemoglobin: 8.6 g/dL — ABNORMAL LOW (ref 13.0–17.0)

## 2024-02-24 LAB — BLOOD GAS, ARTERIAL
Acid-base deficit: 3.9 mmol/L — ABNORMAL HIGH (ref 0.0–2.0)
Bicarbonate: 19.5 mmol/L — ABNORMAL LOW (ref 20.0–28.0)
O2 Content: 3 L/min
O2 Saturation: 96.2 %
Patient temperature: 37
pCO2 arterial: 30 mmHg — ABNORMAL LOW (ref 32–48)
pH, Arterial: 7.42 (ref 7.35–7.45)
pO2, Arterial: 67 mmHg — ABNORMAL LOW (ref 83–108)

## 2024-02-24 LAB — BRAIN NATRIURETIC PEPTIDE: B Natriuretic Peptide: 24.5 pg/mL (ref 0.0–100.0)

## 2024-02-24 LAB — MAGNESIUM
Magnesium: 1.6 mg/dL — ABNORMAL LOW (ref 1.7–2.4)
Magnesium: 2.7 mg/dL — ABNORMAL HIGH (ref 1.7–2.4)

## 2024-02-24 LAB — LACTIC ACID, PLASMA
Lactic Acid, Venous: 2.1 mmol/L (ref 0.5–1.9)
Lactic Acid, Venous: 2.9 mmol/L (ref 0.5–1.9)
Lactic Acid, Venous: 3.4 mmol/L (ref 0.5–1.9)

## 2024-02-24 LAB — FOLATE: Folate: 14.7 ng/mL (ref >5.9)

## 2024-02-24 LAB — PHOSPHORUS
Phosphorus: 1.9 mg/dL — ABNORMAL LOW (ref 2.5–4.6)
Phosphorus: 2.5 mg/dL (ref 2.5–4.6)

## 2024-02-24 LAB — VITAMIN B12: Vitamin B-12: 207 pg/mL (ref 180–914)

## 2024-02-24 LAB — VITAMIN D 25 HYDROXY (VIT D DEFICIENCY, FRACTURES): Vit D, 25-Hydroxy: 47.08 ng/mL (ref 30–100)

## 2024-02-24 MED ORDER — BISACODYL 5 MG PO TBEC
10.0000 mg | DELAYED_RELEASE_TABLET | Freq: Every day | ORAL | Status: DC
  Administered 2024-02-25 – 2024-02-26 (×2): 10 mg via ORAL
  Filled 2024-02-24 (×3): qty 2

## 2024-02-24 MED ORDER — VITAMIN B-12 1000 MCG PO TABS: 1000.0000 ug | ORAL_TABLET | Freq: Every day | ORAL | Status: DC

## 2024-02-24 MED ORDER — CYANOCOBALAMIN 1000 MCG/ML IJ SOLN
1000.0000 ug | Freq: Every day | INTRAMUSCULAR | Status: DC
  Administered 2024-02-24 – 2024-02-27 (×4): 1000 ug via INTRAMUSCULAR
  Filled 2024-02-24 (×4): qty 1

## 2024-02-24 MED ORDER — POLYETHYLENE GLYCOL 3350 17 G PO PACK
17.0000 g | PACK | Freq: Two times a day (BID) | ORAL | Status: DC
  Administered 2024-02-25 – 2024-02-26 (×4): 17 g via ORAL
  Filled 2024-02-24 (×5): qty 1

## 2024-02-24 MED ORDER — NITROGLYCERIN 0.4 MG SL SUBL
0.4000 mg | SUBLINGUAL_TABLET | SUBLINGUAL | Status: AC | PRN
  Administered 2024-02-24 (×3): 0.4 mg via SUBLINGUAL
  Filled 2024-02-24: qty 1

## 2024-02-24 MED ORDER — NITROGLYCERIN 0.4 MG SL SUBL
SUBLINGUAL_TABLET | SUBLINGUAL | Status: AC
  Filled 2024-02-24: qty 1

## 2024-02-24 MED ORDER — BISACODYL 5 MG PO TBEC: 10.0000 mg | DELAYED_RELEASE_TABLET | Freq: Once | ORAL | Status: DC

## 2024-02-24 MED ORDER — POLYSACCHARIDE IRON COMPLEX 150 MG PO CAPS
150.0000 mg | ORAL_CAPSULE | Freq: Every day | ORAL | Status: DC
  Administered 2024-02-25 – 2024-02-27 (×3): 150 mg via ORAL
  Filled 2024-02-24 (×3): qty 1

## 2024-02-24 MED ORDER — SODIUM CHLORIDE 0.9 % IV BOLUS
1000.0000 mL | Freq: Once | INTRAVENOUS | Status: AC
Start: 2024-02-25 — End: 2024-02-24
  Administered 2024-02-24: 1000 mL via INTRAVENOUS

## 2024-02-24 MED ORDER — PANTOPRAZOLE SODIUM 40 MG IV SOLR
40.0000 mg | INTRAVENOUS | Status: AC
  Administered 2024-02-24 (×2): 40 mg via INTRAVENOUS
  Filled 2024-02-24 (×2): qty 10

## 2024-02-24 MED ORDER — SODIUM PHOSPHATES 45 MMOLE/15ML IV SOLN
30.0000 mmol | Freq: Once | INTRAVENOUS | Status: AC
  Administered 2024-02-24: 30 mmol via INTRAVENOUS
  Filled 2024-02-24: qty 10

## 2024-02-24 MED ORDER — SODIUM CHLORIDE 0.9 % IV SOLN
3.0000 g | Freq: Four times a day (QID) | INTRAVENOUS | Status: DC
  Administered 2024-02-24 (×2): 3 g via INTRAVENOUS
  Filled 2024-02-24 (×2): qty 8

## 2024-02-24 MED ORDER — LISINOPRIL 5 MG PO TABS
10.0000 mg | ORAL_TABLET | Freq: Every day | ORAL | Status: DC
Start: 2024-02-26 — End: 2024-02-24

## 2024-02-24 MED ORDER — PROCHLORPERAZINE EDISYLATE 10 MG/2ML IJ SOLN
10.0000 mg | Freq: Once | INTRAMUSCULAR | Status: AC
  Administered 2024-02-24: 10 mg via INTRAVENOUS
  Filled 2024-02-24: qty 2

## 2024-02-24 MED ORDER — NALOXONE HCL 0.4 MG/ML IJ SOLN
0.4000 mg | INTRAMUSCULAR | Status: DC | PRN
  Administered 2024-02-24: 0.4 mg via INTRAVENOUS
  Filled 2024-02-24: qty 1

## 2024-02-24 MED ORDER — PIPERACILLIN-TAZOBACTAM 3.375 G IVPB
3.3750 g | Freq: Three times a day (TID) | INTRAVENOUS | Status: DC
  Administered 2024-02-24 – 2024-02-27 (×9): 3.375 g via INTRAVENOUS
  Filled 2024-02-24 (×10): qty 50

## 2024-02-24 MED ORDER — PANTOPRAZOLE SODIUM 40 MG IV SOLR
40.0000 mg | Freq: Three times a day (TID) | INTRAVENOUS | Status: AC
  Administered 2024-02-24 – 2024-02-27 (×9): 40 mg via INTRAVENOUS
  Filled 2024-02-24 (×9): qty 10

## 2024-02-24 MED ORDER — SODIUM CHLORIDE 0.9 % IV SOLN: INTRAVENOUS | Status: DC

## 2024-02-24 MED ORDER — VITAMIN C 500 MG PO TABS
500.0000 mg | ORAL_TABLET | Freq: Every day | ORAL | Status: DC
  Administered 2024-02-25 – 2024-02-27 (×3): 500 mg via ORAL
  Filled 2024-02-24 (×3): qty 1

## 2024-02-24 MED ORDER — MAGNESIUM SULFATE 2 GM/50ML IV SOLN
2.0000 g | Freq: Once | INTRAVENOUS | Status: AC
  Administered 2024-02-24: 2 g via INTRAVENOUS
  Filled 2024-02-24: qty 50

## 2024-02-24 MED ORDER — MIDODRINE HCL 5 MG PO TABS
10.0000 mg | ORAL_TABLET | Freq: Three times a day (TID) | ORAL | Status: DC | PRN
  Administered 2024-02-24: 10 mg via ORAL
  Filled 2024-02-24: qty 2

## 2024-02-24 MED ORDER — BUDESONIDE 0.25 MG/2ML IN SUSP
0.2500 mg | Freq: Two times a day (BID) | RESPIRATORY_TRACT | Status: DC
  Administered 2024-02-24 – 2024-02-27 (×7): 0.25 mg via RESPIRATORY_TRACT
  Filled 2024-02-24 (×7): qty 2

## 2024-02-24 MED ORDER — ALPRAZOLAM 0.25 MG PO TABS
0.2500 mg | ORAL_TABLET | Freq: Two times a day (BID) | ORAL | Status: AC | PRN
Start: 2024-02-24 — End: ?
  Administered 2024-02-24 – 2024-02-26 (×2): 0.25 mg via ORAL
  Filled 2024-02-24 (×2): qty 1

## 2024-02-24 MED ORDER — IOHEXOL 300 MG/ML  SOLN: 100.0000 mL | Freq: Once | INTRAMUSCULAR | Status: DC | PRN

## 2024-02-24 MED ORDER — SODIUM CHLORIDE 0.9 % IV BOLUS
500.0000 mL | Freq: Once | INTRAVENOUS | Status: AC
  Administered 2024-02-24: 500 mL via INTRAVENOUS

## 2024-02-24 MED ORDER — BISACODYL 10 MG RE SUPP
10.0000 mg | Freq: Once | RECTAL | Status: AC
Start: 2024-02-24 — End: 2024-02-24
  Administered 2024-02-24: 10 mg via RECTAL
  Filled 2024-02-24: qty 1

## 2024-02-24 MED ORDER — ISOSORBIDE MONONITRATE ER 30 MG PO TB24: 60.0000 mg | ORAL_TABLET | Freq: Every day | ORAL | Status: DC

## 2024-02-24 MED ORDER — PANTOPRAZOLE SODIUM 40 MG IV SOLR: 40.0000 mg | Freq: Two times a day (BID) | INTRAVENOUS | Status: DC

## 2024-02-24 MED ORDER — CHLORHEXIDINE GLUCONATE CLOTH 2 % EX PADS
6.0000 | MEDICATED_PAD | Freq: Every day | CUTANEOUS | Status: DC
  Administered 2024-02-24 – 2024-02-27 (×4): 6 via TOPICAL

## 2024-02-24 MED ORDER — BISACODYL 10 MG RE SUPP: 10.0000 mg | Freq: Every day | RECTAL | Status: DC | PRN

## 2024-02-24 MED ORDER — IPRATROPIUM-ALBUTEROL 0.5-2.5 (3) MG/3ML IN SOLN
3.0000 mL | Freq: Three times a day (TID) | RESPIRATORY_TRACT | Status: DC
  Administered 2024-02-24 – 2024-02-25 (×2): 3 mL via RESPIRATORY_TRACT
  Filled 2024-02-24 (×2): qty 3

## 2024-02-24 MED ORDER — METOPROLOL SUCCINATE ER 50 MG PO TB24: 25.0000 mg | ORAL_TABLET | Freq: Every day | ORAL | Status: DC

## 2024-02-24 MED ORDER — ENOXAPARIN SODIUM 40 MG/0.4ML IJ SOSY: 40.0000 mg | PREFILLED_SYRINGE | Freq: Every evening | INTRAMUSCULAR | Status: DC

## 2024-02-24 MED ORDER — ARFORMOTEROL TARTRATE 15 MCG/2ML IN NEBU
15.0000 ug | INHALATION_SOLUTION | Freq: Two times a day (BID) | RESPIRATORY_TRACT | Status: DC
  Administered 2024-02-24 – 2024-02-27 (×7): 15 ug via RESPIRATORY_TRACT
  Filled 2024-02-24 (×7): qty 2

## 2024-02-24 MED ORDER — LACTATED RINGERS IV BOLUS
1000.0000 mL | Freq: Once | INTRAVENOUS | Status: AC
  Administered 2024-02-24: 1000 mL via INTRAVENOUS

## 2024-02-24 MED ORDER — IPRATROPIUM-ALBUTEROL 0.5-2.5 (3) MG/3ML IN SOLN
3.0000 mL | Freq: Four times a day (QID) | RESPIRATORY_TRACT | Status: DC
  Administered 2024-02-24: 3 mL via RESPIRATORY_TRACT
  Filled 2024-02-24: qty 3

## 2024-02-24 MED ORDER — METHYLPREDNISOLONE SODIUM SUCC 125 MG IJ SOLR
125.0000 mg | Freq: Once | INTRAMUSCULAR | Status: AC
  Administered 2024-02-24: 125 mg via INTRAVENOUS
  Filled 2024-02-24: qty 2

## 2024-02-24 NOTE — Progress Notes (Signed)
   02/24/24 0630  Spiritual Encounters  Type of Visit Initial  Care provided to: Family  Conversation partners present during encounter Physician  Referral source Code page  Reason for visit Code  OnCall Visit Yes  Interventions  Spiritual Care Interventions Made Established relationship of care and support;Compassionate presence;Reflective listening;Normalization of emotions  Intervention Outcomes  Outcomes Connection to spiritual care;Awareness around self/spiritual resourses;Awareness of support  Spiritual Care Plan  Spiritual Care Issues Still Outstanding No further spiritual care needs at this time (see row info)   Chaplain responded to rapid response call to find wife, Billy Cisneros, at the bedside. Spoke with her and she stated their son had died 8 years ago today. Chaplain was able to normalize emotions. Pt moved from 1A to ICU and chaplain showed Billy Cisneros the waiting room and let staff know she was there. Chaplain services remains available

## 2024-02-24 NOTE — Progress Notes (Addendum)
   02/24/24 1600  Spiritual Encounters  Type of Visit Follow up  Care provided to: Pt and family  OnCall Visit No  Interventions  Spiritual Care Interventions Made Compassionate presence;Reflective listening;Encouragement  Intervention Outcomes  Outcomes Connection to values and goals of care   Chaplain followed up with patient and spouse. Patient is progressing well with therapy

## 2024-02-24 NOTE — Progress Notes (Signed)
 Physical Therapy Treatment Patient Details Name: Billy Cisneros. MRN: [id number] DOB: Nov 07, 1950 Today's Date: 02/24/2024   History of Present Illness [name]. Is s/p C3-7 laminoplasty. Rapid response called 10/14 and pt transferred to ICU with AMS.    PT Comments  Patient seen for PT session focused on bed level exercises. Patient required modA for bed mobility and positioning. Tolerated session well with moderate signs of exertion with sitting bed level exercises. Vitals remained stable during activity. Main limiting factors today were fatigue. Interventions aimed at improving activity tolerance and OOB mobility. Continued skilled PT recommended to progress toward functional goals and support discharge readiness.    If plan is discharge home, recommend the following: A little help with walking and/or transfers;A little help with bathing/dressing/bathroom   Can travel by private Psychologist, clinical (4 wheels)    Recommendations for Other Services       Precautions / Restrictions Precautions Precautions: Cervical Precaution Booklet Issued: Yes (comment) Recall of Precautions/Restrictions: Intact Restrictions Weight Bearing Restrictions Per Provider Order: No     Mobility  Bed Mobility Overal bed mobility: Needs Assistance Bed Mobility: Sidelying to Sit, Sit to Sidelying Rolling: Mod assist Sidelying to sit: Mod assist Supine to sit: Mod assist   Sit to sidelying: Mod assist General bed mobility comments: bed mobility with increased time    Transfers                        Ambulation/Gait                   Stairs             Wheelchair Mobility     Tilt Bed    Modified Rankin (Stroke Patients Only)       Balance Overall balance assessment: Needs assistance Sitting-balance support: Feet supported, Single extremity supported Sitting balance-Leahy Scale: Good Sitting balance -  Comments: PT limited to bed activities; Pt experessed fatigue from earlier session and demonstrated LE fatigue during pre standing exercises: LAQ 2x 10 each leg; ankle pumps 2 x 10 each leg; marching 2 x 10 each leg                                    Communication Communication Communication: Impaired Factors Affecting Communication: Hearing impaired  Cognition Arousal: Alert Behavior During Therapy: Impulsive   PT - Cognitive impairments: No apparent impairments                         Following commands: Intact      Cueing Cueing Techniques: Verbal cues  Exercises      General Comments        Pertinent Vitals/Pain Pain Assessment Pain Assessment: Faces Faces Pain Scale: Hurts even more Pain Location: neck and bilateral shoulders Pain Descriptors / Indicators: Dull, Aching, Sore, Discomfort Pain Intervention(s): Limited activity within patient's tolerance, Monitored during session, Repositioned    Home Living                          Prior Function            PT Goals (current goals can now be found in the care plan section) Acute Rehab PT Goals Patient Stated Goal: Pt. reports that he wants  to get stronger PT Goal Formulation: With patient/family Time For Goal Achievement: 03/08/24 Potential to Achieve Goals: Good Progress towards PT goals: Progressing toward goals    Frequency    Min 4X/week      PT Plan      Co-evaluation              AM-PAC PT 6 Clicks Mobility   Outcome Measure  Help needed turning from your back to your side while in a flat bed without using bedrails?: A Lot Help needed moving from lying on your back to sitting on the side of a flat bed without using bedrails?: A Lot Help needed moving to and from a bed to a chair (including a wheelchair)?: A Lot Help needed standing up from a chair using your arms (e.g., wheelchair or bedside chair)?: A Lot Help needed to walk in hospital room?: A  Lot Help needed climbing 3-5 steps with a railing? : Total 6 Click Score: 11    End of Session Equipment Utilized During Treatment: Gait belt Activity Tolerance: Patient tolerated treatment well Patient left: in bed;with bed alarm set Nurse Communication: Mobility status PT Visit Diagnosis: Unsteadiness on feet (R26.81);Muscle weakness (generalized) (M62.81)     Time: 8456-8441 PT Time Calculation (min) (ACUTE ONLY): 15 min  Charges:    $Therapeutic Exercise: 8-22 mins PT General Charges $$ ACUTE PT VISIT: 1 Visit                     Sherlean Lesches DPT, PT     Sherlean A Ewin Rehberg 02/24/2024, 4:18 PM

## 2024-02-24 NOTE — Progress Notes (Signed)
 [facility] Progress Note  Patient: Billy Cisneros.    FMW:[id number]  DOA: 02/22/2024     Date of Service: the patient was seen and examined on 02/24/2024  No chief complaint on file.  Brief hospital course: Billy Cisneros. is a 73 y.o. male with past medical history of CAD s/p CABG x 5, HTN, spinal stenosis s/p laminectomy 10/13 (prior cardiac clearance on 10/6) being seen in urgent consultation during a rapid response.  According to bedside nurse, patient had normal mentation at 4 AM however couple hours later he was difficult to arouse and had slurred speech.  Vitals revealed hypotension and hypoxia, all prompting the rapid response.  Nurse also reports that the day prior patient had an episode of coffee-ground emesis and did appear a bit confused during the afternoon hours.   Of note, patient did receive several meds over the course of the past 24 hours related to vomiting/insomnia/anxiety depression including Phenergan, Ambien , methocarbamol  and alprazolam  at 2148 on 10/14, and hydromorphone 0.5 mg at 2041.  10/15 replaced which was called today early morning and TRH was consulted for comanagement.   Assessment and Plan:  # Acute hypoxic respiratory failure secondary to aspiration pneumonia Continue supplemental nutrition and gradually wean Solu-Medrol 125 mg x 1 dose Started Brovana and Pulmicort nebulizer twice daily DuoNeb every 6 hourly   # Hypotension most likely secondary to sepsis IV fluid bolus given Avoid excessive fluid due to respiratory failure Midodrine order placed but patient cannot take it due to altered mental status Critical care consulted, patient may need pressure support   # Aspiration pneumonia, as patient had an episode of coffee-ground emesis most likely due to altered mental status Started Unasyn Pharmacy consulted Check blood cultures  # Acute metabolic encephalopathy, multifactorial CT head negative for any acute findings Avoid  sedating medications Reorient, continue fall precautions Continue supportive care   # Coffee-ground emesis, possible upper GI bleed Continue PPI Hb 104> 8.3 Monitor H&H GI consulted for further recommendation  # Spinal stenosis, s/p laminectomy done on 10/3 Continue as needed pain meds Follow neurosurgery   # AKI and urinary retention 10/15 Foley catheter was inserted and 500 mL urine was collected Avoid nephrotic medication, use renal dose medications Check renal functions daily sCr 1.75>1.82>1.52  # CAD s/p CABG, HTN, HLD 10/15 hypotensive possible due to sepsis Discontinued antihypertensive medications for now (Home meds Imdur , lisinopril , Toprol -XL, Crestor ) Hold aspirin  for now due to possible GI bleeding Monitor BP and titrate medications accordingly Started midodrine, if not able to swallow then patient may need pressure support Critical care consulted.   # Hypophosphatemia, due to nutritional deficiency.  Phos repleted. # Hypomagnesemia, mag repleted. Monitor electrolytes daily and replete as needed.  # Vitamin B12 deficiency: B12 level 207, goal >400.  Started vitamin B12 1000 mcg IM injection daily during hospital stay, followed by oral supplement.  Follow-up PCP to repeat vitamin B12 level after 3 to 6 months.  # Iron deficiency, start oral iron supplement with vitamin C when stable.  I have added IV iron due to possible active infection.  # Constipation, started laxatives.  # Gout: Allopurinol # Depression: Continue Abilify and Wellbutrin  # Hypothyroid: Synthroid   Body mass index is 30.19 kg/m.  Interventions:    Diet: NPO DVT Prophylaxis: SCDs   Advance goals of care discussion: Full code  Family Communication: family was present at bedside, at the time of interview.  The pt provided permission to discuss medical plan with  the family. Opportunity was given to ask question and all questions were answered satisfactorily.  10/15 discussed with  patient's wife at bedside  Disposition:  Pt is from Home, admitted with laminectomy, developed aspiration pneumonia, hematemesis, hypotension, multiple other conditions as dictated above, still very sick and not stable, which precludes a safe discharge. Discharge to home with home health versus SNF, TBD, when stable, may need few days to improve. TRH is a Psychologist, counselling. Primary team is neurosurgery  Subjective: No significant events overnight, early morning patient became sick and had an episode of hypoxia and coffee-ground emesis, developed respiratory failure and hypotension.  Rapid response was called.  During my exam patient was disoriented, pleasantly confused and having shortness of breath, unable to provide specific complaints, he was just feeling neck stiffness. Patient's wife was at bedside, management plan discussed.  Physical Exam: General: Respiratory distress, pleasantly disoriented Eyes: PERRLA ENT: Oral Mucosa Clear, moist  Neck: no JVD,  Cardiovascular: S1 and S2 Present, no Murmur,  Respiratory: Creased respiratory rate, short of breath, bilateral crackles, mild wheezes Abdomen: BS present, Soft and no tenderness,  Skin: no rashes Extremities: no Pedal edema, no calf tenderness Neurologic: without any new focal findings Gait not checked due to patient safety concerns  Vitals:   02/24/24 1315 02/24/24 1330 02/24/24 1345 02/24/24 1400  BP: (!) 88/56 (!) 85/66 114/66 (!) 98/57  Pulse: (!) 116 (!) 114 (!) 120 (!) 116  Resp: (!) 32 (!) 30 (!) 26 (!) 22  Temp:      TempSrc:      SpO2: 95% 100% 99% 100%  Weight:      Height:        Intake/Output Summary (Last 24 hours) at 02/24/2024 1420 Last data filed at 02/24/2024 1320 Gross per 24 hour  Intake 1447.01 ml  Output 1827 ml  Net -379.99 ml   Filed Weights   02/24/24 0721  Weight: 82.3 kg    Data Reviewed: I have personally reviewed and interpreted daily labs, tele strips, imagings as discussed above. I  reviewed all nursing notes, pharmacy notes, vitals, pertinent old records I have discussed plan of care as described above with RN and patient/family.  CBC: Recent Labs  Lab 02/23/24 1847 02/24/24 0612 02/24/24 1155  WBC 14.5* 10.2  --   HGB 10.4* 9.7* 8.3*  HCT 29.9* 29.1*  --   MCV 90.9 92.4  --   PLT 267 214  --    Basic Metabolic Panel: Recent Labs  Lab 02/22/24 1904 02/23/24 1847 02/24/24 0612  NA  --  136 136  K  --  4.7 4.6  CL  --  99 104  CO2  --  25 21*  GLUCOSE  --  163* 121*  BUN  --  42* 36*  CREATININE 1.75* 1.82* 1.52*  CALCIUM   --  8.5* 8.0*  MG  --   --  1.6*  PHOS  --   --  1.9*    Studies: DG Chest Port 1 View Result Date: 02/24/2024 CLINICAL DATA:  [id number] Pulmonary edema [id number] EXAM: PORTABLE CHEST - 1 VIEW COMPARISON:  02/24/2024 5:50 a.m. FINDINGS: Low lung volumes. Similar streaky atelectasis in the perihilar regions bilaterally. No new airspace consolidation, pleural effusion, or pneumothorax. Mild cardiomegaly. Sternotomy wires and CABG markers. Aortic atherosclerosis. No acute fracture or destructive lesions. Multilevel thoracic osteophytosis. IMPRESSION: Low lung volumes.  Otherwise, no acute cardiopulmonary abnormality. Electronically Signed   By: Rogelia Myers M.D.   On: 02/24/2024 09:23  CT HEAD WO CONTRAST ( ) Result Date: 02/24/2024 EXAM: CT HEAD WITHOUT CONTRAST 02/24/2024 07:11:12 AM TECHNIQUE: CT of the head was performed without the administration of intravenous contrast. Automated exposure control, iterative reconstruction, and/or weight based adjustment of the mA/kV was utilized to reduce the radiation dose to as low as reasonably achievable. COMPARISON: Head CT dated 11/23/2017. CLINICAL HISTORY: Mental status change, unknown cause. FINDINGS: BRAIN AND VENTRICLES: Mild to moderate cerebral atrophy, small vessel disease, and atrophic ventriculomegaly are again noted. There is relatively minimal cerebellar volume loss. No acute  hemorrhage. No cortical based acute infarct. No mass effect or midline shift. No extra-axial collection. The basal cisterns are clear. There are calcifications in the distal right vertebral artery, the basilar artery, and both siphons. No hyperdense vessels. ORBITS: No acute abnormality. SINUSES: Paranasal sinuses and left mastoid air cells are clear. Trace chronic fluid again noted at the right mastoid tip. SOFT TISSUES AND SKULL: There are dorsal midline skin staples partially visible in the upper neck as well as a small amount of subcutaneous soft tissue gas, presumably left over from cervical laminoplasty done 2 days ago. No focal skull lesions or skull fractures are seen. IMPRESSION: 1. No acute intracranial CT findings. Stable exam. Atrophy and small vessel changes. 2. Postoperative changes in the upper neck with dorsal midline skin staples and small volume subcutaneous gas related to recent cervical laminoplasty. Electronically signed by: Francis Quam MD 02/24/2024 07:39 AM EDT RP Workstation: [id number]   CT ABDOMEN PELVIS WO CONTRAST Result Date: 02/24/2024 EXAM: CT ABDOMEN AND PELVIS WITHOUT CONTRAST 02/24/2024 07:11:12 AM TECHNIQUE: CT of the abdomen and pelvis was performed without the administration of intravenous contrast. Multiplanar reformatted images are provided for review. Automated exposure control, iterative reconstruction, and/or weight-based adjustment of the mA/kV was utilized to reduce the radiation dose to as low as reasonably achievable. COMPARISON: CT abdomen pelvis with iv contrast 02/16/2024. AP and lateral chest from 02/24/2024. CLINICAL HISTORY: Abdominal pain, acute, nonlocalized. FINDINGS: LOWER CHEST: Increased posterior atelectasis in both lower lobes. Increased bilateral lower lobe bronchial thickening and patchy hazy consolidation worrisome for bilateral pneumonia or aspiration. Subpleural linear scarring or atelectasis again noted in the lateral base of the right middle  lobe with remaining lungs clear. CABG changes with native 3 vessel coronary calcifications and normal cardiac size. Moderate sized hiatal hernia. LIVER: The liver is mildly steatotic and otherwise unremarkable. GALLBLADDER AND BILE DUCTS: Gallbladder is unremarkable. No biliary ductal dilatation. SPLEEN: No acute abnormality. PANCREAS: No acute abnormality. ADRENAL GLANDS: No acute abnormality. KIDNEYS, URETERS AND BLADDER: No stones in the kidneys or ureters. No hydronephrosis. No perinephric or periureteral stranding. Urinary bladder is unremarkable. There is no contour deforming renal mass. GI AND BOWEL: There are retained food products in the proximal stomach and in the hiatal hernia. Normal caliber of the small bowel and appendix. Mild to moderate fecal stasis in the cecum and ascending colon. There are scattered descending and sigmoid diverticula but no evidence of diverticulitis. There is no bowel obstruction. PERITONEUM AND RETROPERITONEUM: No ascites. No free air. VASCULATURE: Aorta is normal in caliber. Moderate aortic atherosclerosis without aneurysm. LYMPH NODES: No lymphadenopathy. REPRODUCTIVE ORGANS: No acute abnormality. BONES AND SOFT TISSUES: Degenerative changes thoracic and lumbar spine. No focal soft tissue abnormality. IMPRESSION: 1. Increased bilateral lower lobe bronchial thickening and patchy hazy consolidation, worrisome for bilateral pneumonia or aspiration. 2. Moderate sized hiatal hernia with retained food products in the proximal stomach and in the hernia. 3. Constipation and diverticulosis. 4. Aortic atherosclerosis.  Electronically signed by: Francis Quam MD 02/24/2024 07:30 AM EDT RP Workstation: [id number]   DG Chest 2 View Result Date: 02/24/2024 EXAM: 2 VIEW(S) XRAY OF THE CHEST 02/24/2024 05:53:50 AM COMPARISON: Portable chest 10/09/2022. CLINICAL HISTORY: Chest pain [phone]. Chest pain/ pt had difficulty following instructions to remain still during exam. FINDINGS: LUNGS AND  PLEURA: The lungs are expiratory. There is perihilar linear atelectasis. No focal pneumonia is evident. No substantial pleural effusion. No pneumothorax. No pulmonary edema. HEART AND MEDIASTINUM: The cardiac size is normal. Stable mediastinum with mild aortic atherosclerosis. No vascular congestion is seen. Sternotomy and cabg changes. BONES AND SOFT TISSUES: Kyphosis and degenerative change of the thoracic spine. No acute osseous abnormality. IMPRESSION: 1. No acute cardiopulmonary findings: Low lung volumes with perihilar atelectasis. . 2. Prior cabg. 3. Aortic atherosclerosis. Electronically signed by: Francis Quam MD 02/24/2024 06:29 AM EDT RP Workstation: [id number]    Scheduled Meds:  acetaminophen   1,000 mg Oral Q6H   allopurinol  100 mg Oral Daily   arformoterol  15 mcg Nebulization BID   And   budesonide (PULMICORT) nebulizer solution  0.25 mg Nebulization BID   ARIPiprazole  2 mg Oral QHS   [START ON 02/25/2024] ascorbic acid  500 mg Oral Daily   bisacodyl  10 mg Oral QHS   buPROPion   300 mg Oral Daily   calcium  carbonate  1 tablet Oral TID WC   Chlorhexidine Gluconate Cloth  6 each Topical Daily   docusate sodium  100 mg Oral BID   [START ON 02/25/2024] enoxaparin  (LOVENOX ) injection  40 mg Subcutaneous QPM   ipratropium-albuterol  3 mL Nebulization Q6H   [START ON 02/25/2024] iron polysaccharides  150 mg Oral Daily   levothyroxine  25 mcg Oral Q0600   pantoprazole   80 mg Oral Daily   polyethylene glycol  17 g Oral BID   rosuvastatin   20 mg Oral QHS   sodium chloride  flush  3 mL Intravenous Q12H   Continuous Infusions:  sodium chloride  75 mL/hr at 02/24/24 1136   ampicillin-sulbactam (UNASYN) IV 3 g (02/24/24 1033)   famotidine  (PEPCID ) IV Stopped (02/23/24 2113)   magnesium  sulfate bolus IVPB 2 g (02/24/24 1320)   promethazine (PHENERGAN) injection (IM or IVPB) Stopped (02/23/24 2208)   sodium PHOSPHATE IVPB (in mmol) 30 mmol (02/24/24 1319)   PRN Meds: acetaminophen   **OR** acetaminophen , ALPRAZolam , bisacodyl, colchicine , iohexol , magnesium  citrate, menthol **OR** phenol, midodrine, naLOXone (NARCAN)  injection, ondansetron  **OR** ondansetron  (ZOFRAN ) IV, promethazine (PHENERGAN) injection (IM or IVPB), simethicone , sodium chloride  flush, sorbitol  Time spent: 55 minutes  Author: ELVAN SOR. MD [facility] 02/24/2024 2:20 PM  To reach On-call, see care teams to locate the attending and reach out to them via [email or web address]. If 7PM-7AM, please contact night-coverage If you still have difficulty reaching the attending provider, please page the [facility] (Director on Call) for [facility] on amion for assistance.

## 2024-02-24 NOTE — Consult Note (Signed)
 Initial Consultation Note   Patient: Billy Cisneros. FMW:[id number] DOB: 1950-09-23 PCP: [name], MD DOA: 02/22/2024 DOS: the patient was seen and examined on 02/24/2024 Primary service: Clois Fret, MD  Stat medical consultation for rapid response  Referring physician: Dr. Redell Sharps Reason for consult: Altered mental status  Assessment/Plan: Assessment and Plan: Acute metabolic encephalopathy Acute focal neurologic deficit--slurred speech Possible polypharmacy Patient appears to have started having mental status changes from 02/23/2024 Multiple sedating meds on board Unable to appreciate focal deficits on exam-somnolent Stat CT head--last known well will be the day prior Neurologic checks with fall and aspiration precautions Keep n.p.o. until more awake Will transfer to stepdown for closer monitoring Minimize sedating meds if possible Findings communicated with consulting physician, rapid response team at bedside Consider neurology consult   Acute respiratory failure with hypoxia (HCC) Initially hypoxic to the 80s, per nurse at bedside Chest x-ray done about an hour earlier without acute findings Acute PE among differentials though low suspicion given main presenting symptom of altered mental status ABG on 2 L done during rapid unremarkable Continue supplemental oxygen ABG    Component Value Date/Time   PHART 7.42 02/24/2024 0630   PCO2ART 30 (L) 02/24/2024 0630   PO2ART 67 (L) 02/24/2024 0630   HCO3 19.5 (L) 02/24/2024 0630   TCO2 24 11/11/2021 1405   ACIDBASEDEF 3.9 (H) 02/24/2024 0630   O2SAT 96.2 02/24/2024 0630     Nausea and vomiting Reported coffee ground emesis on 10/14 Hemoglobin with slight downtrend Consider GI consult Will keep n.p.o. for now We will get CT abdomen and pelvis IV Protonix     Latest Ref Rng & Units 02/24/2024    6:12 AM 02/23/2024    6:47 PM 02/17/2024    9:07 AM  CBC  WBC 4.0 - 10.5 K/uL 10.2  14.5  5.9    Hemoglobin 13.0 - 17.0 g/dL 9.7  89.5  86.2   Hematocrit 39.0 - 52.0 % 29.1  29.9  40.0   Platelets 150 - 400 K/uL 214  267  213       CAD with hx of CABG Patient with history of stable angina recently cleared by cardiology on 10/6 EKG nonacute Stat troponin Continuous cardiac monitoring Cardiology consult pending troponins Patient had a recent cardiac MRI       TRH will continue to follow the patient.  HPI: Billy Cisneros. is a 73 y.o. male with past medical history of CAD s/p CABG x 5, HTN, spinal stenosis s/p laminectomy 10/13 (prior cardiac clearance on 10/6) being seen in urgent consultation during a rapid response.  According to bedside nurse, patient had normal mentation at 4 AM however couple hours later he was difficult to arouse and had slurred speech.  Vitals revealed hypotension and hypoxia, all prompting the rapid response.  Nurse also reports that the day prior patient had an episode of coffee-ground emesis and did appear a bit confused during the afternoon hours.   Of note, patient did receive several meds over the course of the past 24 hours related to vomiting/insomnia/anxiety depression including Phenergan, Ambien , methocarbamol  and alprazolam  at 2148 on 10/14, and hydromorphone 0.5 mg at 2041.    Review of Systems  Unable to perform ROS: Critical illness    Past Medical History:  Diagnosis Date   Anxiety    a.) on BZO (alprazolam ) PRN   Cervical myelopathy (HCC)    CKD (chronic kidney disease) stage 3, GFR 30-59 ml/min (HCC)  Coronary artery disease 03/15/1999   a.) LHC 03/15/1999: multi-vessel CAD --> CVTS; b.) s/p 5v CABG 03/18/1999; c.) LHC 03/29/2003, 03/12/2006, 01/12/2007, 05/02/2010 - MV Dz with patent bypass grafts x 5; d.) LHC 10/14/2019: MV Dz with CTO OM1; e.) LHC 11/11/2021: MV Dz with CTO SVG-OM1 and SVG-D1; f.) NSTEMI 10/09/2022 --> LHC 10/10/2022: MV Dz with CTO OM1 and SVG D1 - med mgmt   DDD (degenerative disc disease),  thoracolumbar    Depression    Diverticulosis    Duodenal diverticulum    GERD (gastroesophageal reflux disease)    Gout    Hepatic steatosis    Hiatal hernia    History of cocaine abuse (HCC)    last used in 2015 per pt (as of 02-15-24)   Hyperlipidemia    Hypertension    Hypothyroidism    Insomnia    a.) on hypnotic (zolpidem ) PRN   Long-term use of aspirin  therapy    NSTEMI (non-ST elevated myocardial infarction) (HCC) 10/09/2022   S/P CABG x 5 03/18/1999   a.) LIMA-LAD, RIMA-RCA, SVG-OM1, SVG-D1, SVG-RI   Spinal stenosis of cervical region with radiculopathy    Umbilical hernia    Unstable angina (HCC) 11/11/2021   Past Surgical History:  Procedure Laterality Date   CERVICAL LAMINOPLASTY N/A 02/22/2024   Procedure: CERVICAL LAMINOPLASTY;  Surgeon: Clois Fret, MD;  Location: [facility] ORS;  Service: Neurosurgery;  Laterality: N/A;  C3-7 LAMINOPLASTY   COLONOSCOPY WITH PROPOFOL  N/A 05/25/2017   Procedure: COLONOSCOPY WITH PROPOFOL ;  Surgeon: Therisa Bi, MD;  Location: [facility] ENDOSCOPY;  Service: Gastroenterology;  Laterality: N/A;   CORONARY ARTERY BYPASS GRAFT N/A 03/18/1999   Procedure: CORONARY ARTERY BYPASS GRAFT; Location: [place]   ESOPHAGOGASTRODUODENOSCOPY N/A 10/27/2017   Procedure: ESOPHAGOGASTRODUODENOSCOPY (EGD);  Surgeon: Unk Corinn Skiff, MD;  Location: [facility] ENDOSCOPY;  Service: Gastroenterology;  Laterality: N/A;   ESOPHAGOGASTRODUODENOSCOPY (EGD) WITH PROPOFOL  N/A 11/19/2017   Procedure: ESOPHAGOGASTRODUODENOSCOPY (EGD) WITH PROPOFOL ;  Surgeon: Unk Corinn Skiff, MD;  Location: [facility] ENDOSCOPY;  Service: Gastroenterology;  Laterality: N/A;   LEFT HEART CATH AND CORONARY ANGIOGRAPHY Left 03/15/1999   Procedure: LEFT HEART CATH AND CORONARY ANGIOGRAPHY; Location: [place]   LEFT HEART CATH AND CORS/GRAFTS ANGIOGRAPHY N/A 10/14/2019   Procedure: LEFT HEART CATH AND CORS/GRAFTS ANGIOGRAPHY;  Surgeon: Claudene Victory ORN, MD;  Location: [facility] INVASIVE CV LAB;   Service: Cardiovascular;  Laterality: N/A;   LEFT HEART CATH AND CORS/GRAFTS ANGIOGRAPHY N/A 11/11/2021   Procedure: LEFT HEART CATH AND CORS/GRAFTS ANGIOGRAPHY;  Surgeon: Mady Bruckner, MD;  Location: [facility] INVASIVE CV LAB;  Service: Cardiovascular;  Laterality: N/A;   LEFT HEART CATH AND CORS/GRAFTS ANGIOGRAPHY N/A 10/10/2022   Procedure: LEFT HEART CATH AND CORS/GRAFTS ANGIOGRAPHY;  Surgeon: Swaz[place], Peter M, MD;  Location: [facility] INVASIVE CV LAB;  Service: Cardiovascular;  Laterality: N/A;   LEFT HEART CATH AND CORS/GRAFTS ANGIOGRAPHY Left 03/29/2003   Procedure: LEFT HEART CATH AND CORS/GRAFTS ANGIOGRAPHY; Location: [facility]; Surgeon: Vinie Jude, MD   LEFT HEART CATH AND CORS/GRAFTS ANGIOGRAPHY Left 03/12/2006   Procedure: LEFT HEART CATH AND CORS/GRAFTS ANGIOGRAPHY; Location: [facility]; Surgeon: Wolm Rhyme, MD   LEFT HEART CATH AND CORS/GRAFTS ANGIOGRAPHY Left 01/12/2007   Procedure: LEFT HEART CATH AND CORS/GRAFTS ANGIOGRAPHY; Location: [facility]; Surgeon: Wolm Rhyme, MD   LEFT HEART CATH AND CORS/GRAFTS ANGIOGRAPHY Left 05/02/2010   Procedure: LEFT HEART CATH AND CORS/GRAFTS ANGIOGRAPHY; Location: [facility]; Surgeon: Marsa Dooms, MD   Social History:  reports that he has never smoked. He has never used smokeless tobacco. He reports current  alcohol  use. He reports that he does not currently use drugs after having used the following drugs: Cocaine. Frequency: 2.00 times per week.  No Known Allergies  Family History  Problem Relation Age of Onset   Heart attack Mother    Hyperlipidemia Mother    Hypertension Mother    Lung cancer Father    Diabetes Sister    Diabetes Brother    Asthma Daughter    Cancer Maternal Grandmother    Stroke Maternal Grandfather    Heart attack Paternal Grandfather     Prior to Admission medications   Medication Sig Start Date End Date Taking? Authorizing Provider  allopurinol (ZYLOPRIM) 100 MG tablet Take 100 mg by mouth daily. 08/25/21  Yes [provider]  ALPRAZolam  (XANAX ) 0.5 MG tablet TAKE 1 TABLET BY MOUTH 3 TIMES A DAY AS NEEDED FOR ANXIETY. 06/24/17  Yes Maree Leni Edyth DELENA, MD  ARIPiprazole (ABILIFY) 2 MG tablet Take 2 mg by mouth at bedtime. 12/25/23  Yes [provider]  aspirin  EC 81 MG tablet Take 1 tablet (81 mg total) by mouth daily. For heart attack prevention. 11/22/11  Yes Mashburn, Aureliano DASEN, PA-C  buPROPion  (WELLBUTRIN  XL) 300 MG 24 hr tablet Take 1 tablet (300 mg total) by mouth daily. 06/24/17  Yes Maree Leni Edyth DELENA, MD  colchicine  0.6 MG tablet Take 0.6 mg by mouth daily as needed (gout). 08/24/21  Yes [provider]  isosorbide  mononitrate (IMDUR ) 60 MG 24 hr tablet Take 1 tablet (60 mg total) by mouth daily. Patient taking differently: Take 60 mg by mouth at bedtime. 12/24/23  Yes Hilty, Vinie BROCKS, MD  levothyroxine (SYNTHROID) 25 MCG tablet Take 25 mcg by mouth daily before breakfast. 12/17/23 12/16/24 Yes [provider]  lisinopril  (PRINIVIL ,ZESTRIL ) 10 MG tablet Take 1 tablet (10 mg total) by mouth daily. Patient taking differently: Take 10 mg by mouth every morning. 06/24/17  Yes Maree Leni Edyth DELENA, MD  metoprolol  succinate (TOPROL  XL) 25 MG 24 hr tablet Take 1 tablet (25 mg total) by mouth daily. Patient taking differently: Take 25 mg by mouth every morning. 12/31/23  Yes Cleaver, Josefa HERO, NP  Multiple Vitamins-Minerals (MULTIVITAMIN WITH MINERALS) tablet Take 1 tablet by mouth at bedtime.   Yes [provider]  omega-3 acid ethyl esters (LOVAZA ) 1 g capsule TAKE ONE CAPSULE BY MOUTH TWICE A DAY 12/24/23  Yes Hilty, Vinie BROCKS, MD  omeprazole  (PRILOSEC) 20 MG capsule Take 1 capsule (20 mg total) by mouth 2 (two) times daily before a meal. 06/24/17  Yes Maree Leni Edyth DELENA, MD  predniSONE  (DELTASONE ) 10 MG tablet Take 10 mg by mouth daily as needed (gout flare). 07/04/21  Yes [provider]  rosuvastatin  (CRESTOR ) 20 MG tablet Take 1 tablet (20 mg total) by mouth at bedtime. 10/03/22   Yes Hilty, Vinie BROCKS, MD  valACYclovir  (VALTREX ) 500 MG tablet Take 500 mg by mouth 2 (two) times daily as needed (outbreak).    Yes [provider]  zolpidem  (AMBIEN ) 5 MG tablet Take 5 mg by mouth at bedtime as needed. 01/25/24  Yes [provider]  nitroGLYCERIN  (NITROSTAT ) 0.4 MG SL tablet Place 1 tablet (0.4 mg total) under the tongue every 5 (five) minutes as needed for chest pain. 12/31/23   Emelia Josefa HERO, NP    Physical Exam: Vitals:   02/23/24 1950 02/24/24 0242 02/24/24 0513 02/24/24 0623  BP: 108/67 110/68 120/70 101/86  Pulse: (!) 104  (!) 118 (!) 123  Resp: 17 18 (!) 24   Temp: 98.3 F (36.8 C) 98.1 F (36.7 C) 98.5 F (36.9 C)   TempSrc:  Oral    SpO2: 95%  93% 92%   Physical Exam Vitals and nursing note reviewed.  Constitutional:      General: He is not in acute distress.    Comments: Somnolent, difficult to arouse but does not appear to be in distress  HENT:     Head: Normocephalic and atraumatic.  Cardiovascular:     Rate and Rhythm: Regular rhythm. Tachycardia present.     Heart sounds: Normal heart sounds.  Pulmonary:     Effort: Pulmonary effort is normal.     Breath sounds: Normal breath sounds.  Abdominal:     Palpations: Abdomen is soft.     Tenderness: There is no abdominal tenderness.     Comments: Mild distention, sluggish bowel sounds but appears nontender  Neurological:     General: No focal deficit present.     Data Reviewed: Relevant notes from primary care and specialist visits, past discharge summaries as available in EHR, including Care Everywhere. Prior diagnostic testing as pertinent to current admission diagnoses Updated medications and problem lists for reconciliation ED course, including vitals, labs, imaging, treatment and response to treatment Triage notes, nursing and pharmacy notes and ED provider's notes Notable results as noted in HPI   Family Communication: Wife at bedside Primary team communication:  Dr. Clois Thank you very much for involving us  in the care of your patient.   CRITICAL CARE Performed by: Delayne LULLA Solian   Total critical care time: 60  minutes  Critical care time was exclusive of separately billable procedures and treating other patients.  Critical care was necessary to treat or prevent imminent or life-threatening deterioration.  Critical care was time spent personally by me on the following activities: development of treatment plan with patient and/or surrogate as well as nursing, discussions with consultants, evaluation of patient's response to treatment, examination of patient, obtaining history from patient or surrogate, ordering and performing treatments and interventions, ordering and review of laboratory studies, ordering and review of radiographic studies, pulse oximetry and re-evaluation of patient's condition.  Author: Delayne LULLA Solian, MD 02/24/2024 7:09 AM  For on call review [email or web address].

## 2024-02-24 NOTE — Progress Notes (Signed)
   Neurosurgery Progress Note  History: Billy Cisneros. Is s/p C3-7 laminoplasty   POD2: Pt experienced confusion, rigors, nausea, and vomiting overnight. He was transferred to ICU and workup is underway for cause.  POD1: Pt experiencing urinary retention this morning requiring I&O cath. Reported expected pain in his shoulders but is otherwise doing well.   Physical Exam: Vitals:   02/24/24 0623 02/24/24 0720  BP: 101/86 (!) 119/59  Pulse: (!) 123 (!) 122  Resp:  (!) 22  Temp:  98.6 F (37 C)  SpO2: 92% 97%    Drowsy and disoriented. Speech is slurred   Strength:  MAEW but unable to participate in strength exam.   Incision: dressing with moderate amount of fresh appear blood. No pooling or obvious active drainage.   Data:  Other tests/results:  Lab Results  Component Value Date   CREATININE 1.52 (H) 02/24/2024   CREATININE 1.82 (H) 02/23/2024   CREATININE 1.75 (H) 02/22/2024      Latest Ref Rng & Units 02/24/2024    6:12 AM 02/23/2024    6:47 PM 02/17/2024    9:07 AM  CBC  WBC 4.0 - 10.5 K/uL 10.2  14.5  5.9   Hemoglobin 13.0 - 17.0 g/dL 9.7  89.5  86.2   Hematocrit 39.0 - 52.0 % 29.1  29.9  40.0   Platelets 150 - 400 K/uL 214  267  213       Latest Ref Rng & Units 02/24/2024    6:12 AM 02/23/2024    6:47 PM 02/22/2024    7:04 PM  CMP  Glucose 70 - 99 mg/dL 878  836    BUN 8 - 23 mg/dL 36  42    Creatinine 9.38 - 1.24 mg/dL 8.47  8.17  8.24   Sodium 135 - 145 mmol/L 136  136    Potassium 3.5 - 5.1 mmol/L 4.6  4.7    Chloride 98 - 111 mmol/L 104  99    CO2 22 - 32 mmol/L 21  25    Calcium  8.9 - 10.3 mg/dL 8.0  8.5    Total Protein 6.5 - 8.1 g/dL 5.6  6.0    Total Bilirubin 0.0 - 1.2 mg/dL 0.7  0.8    Alkaline Phos 38 - 126 U/L 38  48    AST 15 - 41 U/L 34  40    ALT 0 - 44 U/L 26  33       Assessment/Plan:  Billy Cisneros. Is a 73 y.o presenting with cervical stenosis and myelopathy s/p laminoplasty   - work up underway for AMS this  morning. So far appears to be likely due to polypharmacy. CT results pending  - pain control; holding due to cognitive status - made patient NPO until cognition improves  - DVT prophylaxis - HV removed on 10/14.  - PTOT; dispo planning underway   Edsel Goods PA-C Department of Neurosurgery

## 2024-02-24 NOTE — Progress Notes (Signed)
 Dr. Von and Dr. Clois notified pt is still tachy in 110s. RR is still elevated. Still maintaing sats on 4 L . Pt remains hypotensive 88/56 (67). See new orders.

## 2024-02-24 NOTE — Assessment & Plan Note (Addendum)
 Acute focal neurologic deficit--slurred speech Possible polypharmacy Patient appears to have started having mental status changes from 02/23/2024 Multiple sedating meds on board Unable to appreciate focal deficits on exam-somnolent Stat CT head--last known well will be the day prior Neurologic checks with fall and aspiration precautions Keep n.p.o. until more awake Will transfer to stepdown for closer monitoring Minimize sedating meds if possible Findings communicated with consulting physician, rapid response team at bedside Consider neurology consult

## 2024-02-24 NOTE — Assessment & Plan Note (Signed)
 Patient with history of stable angina recently cleared by cardiology on 10/6 EKG nonacute Stat troponin Continuous cardiac monitoring Cardiology consult pending troponins Patient had a recent cardiac MRI

## 2024-02-24 NOTE — Progress Notes (Signed)
 Pharmacy Antibiotic Note  Billy Cisneros. is a 73 y.o. male with PMH of CAD s/p CABG, NSTEMI, unstable angina, HTN, HLD, hypothyroidism, CKD III, GERD, hiatal hernia, cervical spinal stenosis with radiculopathy and myelopathy, thoracolumbar DDD, depression, anxiety, insomnia admitted on 02/22/2024 for surgical intervention. Postoperatively, patient began to experience severe nausea, vomiting, and chest pressure. Concern for aspiration pneumonia.  Pharmacy has been consulted for Unasyn dosing.  Plan: Unasyn 3g every 6 hours  Height: 5' 5 (165.1 cm) Weight: 82.3 kg (181 lb 7 oz) IBW/kg (Calculated) : 61.5  Temp (24hrs), Avg:98.3 F (36.8 C), Min:97.9 F (36.6 C), Max:98.8 F (37.1 C)  Recent Labs  Lab 02/22/24 1904 02/23/24 1847 02/24/24 0612  WBC  --  14.5* 10.2  CREATININE 1.75* 1.82* 1.52*    Estimated Creatinine Clearance: 44 mL/min (A) (by C-G formula based on SCr of 1.52 mg/dL (H)).    No Known Allergies  Antimicrobials this admission: Unasyn 10/15 >> Intraoperative vancomycin powder 10/13  Microbiology results: None ordered  Thank you for allowing pharmacy to be a part of this patient's care.  Bernardino George, PharmD Candidate [phone] [facility] [employer] 02/24/2024 2:24 PM

## 2024-02-24 NOTE — Progress Notes (Addendum)
 At the beginning of the shift, the patient was complaining of severe nausea, vomiting, and pain. PRN medications were administered as prescribed by the MD with partial relief. At 0400, during vital signs assessment and bladder scan, urinary retention was noted. The patient was alert and oriented 2 (baseline 4). Speech was comprehensible but not at baseline. The patient was also experiencing rigors, complained of chest pressure, though vital signs were within defined limits. Dr. Penne Sharps was notified and placed new orders while following up closely via secure chat. A stat chest X-ray, EKG, troponin, and additional labs were ordered. Following the X-ray, the patient's mental status, speech, and vital signs continued to decline. This RN consulted the ICU nurse and notified the hospitalist, then initiated a Rapid Response. The patient's vital signs stabilized but required 3 L oxygen via nasal cannula. After evaluation by the Rapid Response team, Dr. Cleatus, and Dr. Sharps, it was agreed that the patient required transfer to a higher level of care. Report was called to step down unit room 4 RN Colene, RN). Pt was transported to have head CT on the way to the PCU   02/24/24 0513  Assess: MEWS Score  Temp 98.5 F (36.9 C)  BP 120/70  MAP (mmHg) 81  Pulse Rate (!) 118  Resp (!) 24  Level of Consciousness Alert  SpO2 93 %  O2 Device Room Air  Patient Activity (if Appropriate) In bed

## 2024-02-24 NOTE — Care Management Important Message (Signed)
 Important Message  Patient Details  Name: Billy Cisneros. MRN: [id number] Date of Birth: Jul 01, 1950   Important Message Given:  Yes - Medicare IM     Rojelio SHAUNNA Rattler 02/24/2024, 4:38 PM

## 2024-02-24 NOTE — Consult Note (Signed)
 Rogelia Copping, MD [facility]  [address] [place], [place] [address] Phone: [phone] Fax : [phone]  Consultation  Referring Provider:     Clois Fret, MD Primary Care Physician:  [name], MD Primary Gastroenterologist:  Dr. Unk         Reason for Consultation:     Hematemesis  Date of Admission:  02/22/2024 Date of Consultation:  02/24/2024         HPI:   Billy Cisneros. is a 73 y.o. male who has a history of coronary artery disease status post CABG, hypertension and recent spinal stenosis status post laminectomy on the 13th of this month.  The patient had a rapid response called after he had vomited yesterday with coffee-ground emesis and this morning became confused with difficulty to arouse and slurred speech.  The patient was transferred to the ICU with what appears to be aspiration pneumonia.  The patient has also been having hiccups that last only a few minutes and then go away.  The patient denies taking any anti-inflammatory medications at home.  The patient had been treated with Lovenox  and has been on Prilosec 20 mg twice a day by mouth which he takes at home.  He denies any abdominal pain.  Is also no report of any GI bleeding in the past.  The patient had a CT scan of the abdomen and pelvis today that showed:  IMPRESSION: 1. Increased bilateral lower lobe bronchial thickening and patchy hazy consolidation, worrisome for bilateral pneumonia or aspiration. 2. Moderate sized hiatal hernia with retained food products in the proximal stomach and in the hernia. 3. Constipation and diverticulosis. 4. Aortic atherosclerosis.  The patient also was noted to have a decreased iron saturation at 12.  The patient reports that his last colonoscopy was in 2019 by Dr. Therisa.  At that time the patient was noted to have diverticulosis and was recommended to have a repeat colonoscopy in 5 years.  The patient's hemoglobin has decreased since admission  with his most recent hemoglobin trend showing:  Component     Latest Ref Rng 02/17/2024 02/23/2024 02/24/2024  Hemoglobin     13.0 - 17.0 g/dL 86.2  89.5 (L)  8.3 (L)   Hemoglobin        9.7 (L)    The patient now is in the ICU with tachypnea being treated with inhalers and requiring significant oxygen supplementation.    Past Medical History:  Diagnosis Date   Anxiety    a.) on BZO (alprazolam ) PRN   Cervical myelopathy (HCC)    CKD (chronic kidney disease) stage 3, GFR 30-59 ml/min (HCC)    Coronary artery disease 03/15/1999   a.) LHC 03/15/1999: multi-vessel CAD --> CVTS; b.) s/p 5v CABG 03/18/1999; c.) LHC 03/29/2003, 03/12/2006, 01/12/2007, 05/02/2010 - MV Dz with patent bypass grafts x 5; d.) LHC 10/14/2019: MV Dz with CTO OM1; e.) LHC 11/11/2021: MV Dz with CTO SVG-OM1 and SVG-D1; f.) NSTEMI 10/09/2022 --> LHC 10/10/2022: MV Dz with CTO OM1 and SVG D1 - med mgmt   DDD (degenerative disc disease), thoracolumbar    Depression    Diverticulosis    Duodenal diverticulum    GERD (gastroesophageal reflux disease)    Gout    Hepatic steatosis    Hiatal hernia    History of cocaine abuse (HCC)    last used in 2015 per pt (as of 02-15-24)   Hyperlipidemia    Hypertension  Hypothyroidism    Insomnia    a.) on hypnotic (zolpidem ) PRN   Long-term use of aspirin  therapy    Nausea and vomiting 02/24/2024   NSTEMI (non-ST elevated myocardial infarction) (HCC) 10/09/2022   S/P CABG x 5 03/18/1999   a.) LIMA-LAD, RIMA-RCA, SVG-OM1, SVG-D1, SVG-RI   Spinal stenosis of cervical region with radiculopathy    Umbilical hernia    Unstable angina (HCC) 11/11/2021    Past Surgical History:  Procedure Laterality Date   CERVICAL LAMINOPLASTY N/A 02/22/2024   Procedure: CERVICAL LAMINOPLASTY;  Surgeon: Clois Fret, MD;  Location: [facility] ORS;  Service: Neurosurgery;  Laterality: N/A;  C3-7 LAMINOPLASTY   COLONOSCOPY WITH PROPOFOL  N/A 05/25/2017   Procedure: COLONOSCOPY WITH PROPOFOL ;   Surgeon: Therisa Bi, MD;  Location: [facility] ENDOSCOPY;  Service: Gastroenterology;  Laterality: N/A;   CORONARY ARTERY BYPASS GRAFT N/A 03/18/1999   Procedure: CORONARY ARTERY BYPASS GRAFT; Location: [place]   ESOPHAGOGASTRODUODENOSCOPY N/A 10/27/2017   Procedure: ESOPHAGOGASTRODUODENOSCOPY (EGD);  Surgeon: Unk Corinn Skiff, MD;  Location: [facility] ENDOSCOPY;  Service: Gastroenterology;  Laterality: N/A;   ESOPHAGOGASTRODUODENOSCOPY (EGD) WITH PROPOFOL  N/A 11/19/2017   Procedure: ESOPHAGOGASTRODUODENOSCOPY (EGD) WITH PROPOFOL ;  Surgeon: Unk Corinn Skiff, MD;  Location: [facility] ENDOSCOPY;  Service: Gastroenterology;  Laterality: N/A;   LEFT HEART CATH AND CORONARY ANGIOGRAPHY Left 03/15/1999   Procedure: LEFT HEART CATH AND CORONARY ANGIOGRAPHY; Location: [facility]   LEFT HEART CATH AND CORS/GRAFTS ANGIOGRAPHY N/A 10/14/2019   Procedure: LEFT HEART CATH AND CORS/GRAFTS ANGIOGRAPHY;  Surgeon: Claudene Victory ORN, MD;  Location: [facility] INVASIVE CV LAB;  Service: Cardiovascular;  Laterality: N/A;   LEFT HEART CATH AND CORS/GRAFTS ANGIOGRAPHY N/A 11/11/2021   Procedure: LEFT HEART CATH AND CORS/GRAFTS ANGIOGRAPHY;  Surgeon: Mady Bruckner, MD;  Location: [facility] INVASIVE CV LAB;  Service: Cardiovascular;  Laterality: N/A;   LEFT HEART CATH AND CORS/GRAFTS ANGIOGRAPHY N/A 10/10/2022   Procedure: LEFT HEART CATH AND CORS/GRAFTS ANGIOGRAPHY;  Surgeon: Swaziland, Peter M, MD;  Location: [facility] INVASIVE CV LAB;  Service: Cardiovascular;  Laterality: N/A;   LEFT HEART CATH AND CORS/GRAFTS ANGIOGRAPHY Left 03/29/2003   Procedure: LEFT HEART CATH AND CORS/GRAFTS ANGIOGRAPHY; Location: [facility]; Surgeon: Vinie Jude, MD   LEFT HEART CATH AND CORS/GRAFTS ANGIOGRAPHY Left 03/12/2006   Procedure: LEFT HEART CATH AND CORS/GRAFTS ANGIOGRAPHY; Location: [facility]; Surgeon: Wolm Rhyme, MD   LEFT HEART CATH AND CORS/GRAFTS ANGIOGRAPHY Left 01/12/2007   Procedure: LEFT HEART CATH AND CORS/GRAFTS ANGIOGRAPHY; Location: [facility]; Surgeon: Wolm Rhyme, MD   LEFT HEART CATH AND CORS/GRAFTS ANGIOGRAPHY Left 05/02/2010   Procedure: LEFT HEART CATH AND CORS/GRAFTS ANGIOGRAPHY; Location: [facility]; Surgeon: Marsa Dooms, MD    Prior to Admission medications   Medication Sig Start Date End Date Taking? Authorizing Provider  allopurinol (ZYLOPRIM) 100 MG tablet Take 100 mg by mouth daily. 08/25/21  Yes [provider]  ALPRAZolam  (XANAX ) 0.5 MG tablet TAKE 1 TABLET BY MOUTH 3 TIMES A DAY AS NEEDED FOR ANXIETY. 06/24/17  Yes Maree Leni Edyth DELENA, MD  ARIPiprazole (ABILIFY) 2 MG tablet Take 2 mg by mouth at bedtime. 12/25/23  Yes [provider]  aspirin  EC 81 MG tablet Take 1 tablet (81 mg total) by mouth daily. For heart attack prevention. 11/22/11  Yes Mashburn, Aureliano DASEN, PA-C  buPROPion  (WELLBUTRIN  XL) 300 MG 24 hr tablet Take 1 tablet (300 mg total) by mouth daily. 06/24/17  Yes Maree Leni Edyth DELENA, MD  colchicine  0.6 MG tablet Take 0.6 mg by mouth daily as needed (gout). 08/24/21  Yes [provider]  isosorbide  mononitrate (IMDUR ) 60 MG 24 hr tablet Take 1 tablet (60 mg total) by mouth daily. Patient taking differently: Take 60 mg by mouth at bedtime. 12/24/23  Yes Hilty, Vinie BROCKS, MD  levothyroxine (SYNTHROID) 25 MCG tablet Take 25 mcg by mouth daily before breakfast. 12/17/23 12/16/24 Yes [provider]  lisinopril  (PRINIVIL ,ZESTRIL ) 10 MG tablet Take 1 tablet (10 mg total) by mouth daily. Patient taking differently: Take 10 mg by mouth every morning. 06/24/17  Yes Maree Leni Edyth DELENA, MD  metoprolol  succinate (TOPROL  XL) 25 MG 24 hr tablet Take 1 tablet (25 mg total) by mouth daily. Patient taking differently: Take 25 mg by mouth every morning. 12/31/23  Yes Cleaver, Josefa HERO, NP  Multiple Vitamins-Minerals (MULTIVITAMIN WITH MINERALS) tablet Take 1 tablet by mouth at bedtime.   Yes [provider]  omega-3 acid ethyl esters (LOVAZA ) 1 g capsule TAKE ONE CAPSULE BY MOUTH TWICE A DAY 12/24/23  Yes Hilty,  Vinie BROCKS, MD  omeprazole  (PRILOSEC) 20 MG capsule Take 1 capsule (20 mg total) by mouth 2 (two) times daily before a meal. 06/24/17  Yes Maree Leni Edyth DELENA, MD  predniSONE  (DELTASONE ) 10 MG tablet Take 10 mg by mouth daily as needed (gout flare). 07/04/21  Yes [provider]  rosuvastatin  (CRESTOR ) 20 MG tablet Take 1 tablet (20 mg total) by mouth at bedtime. 10/03/22  Yes Hilty, Vinie BROCKS, MD  valACYclovir  (VALTREX ) 500 MG tablet Take 500 mg by mouth 2 (two) times daily as needed (outbreak).    Yes [provider]  zolpidem  (AMBIEN ) 5 MG tablet Take 5 mg by mouth at bedtime as needed. 01/25/24  Yes [provider]  nitroGLYCERIN  (NITROSTAT ) 0.4 MG SL tablet Place 1 tablet (0.4 mg total) under the tongue every 5 (five) minutes as needed for chest pain. 12/31/23   Emelia Josefa HERO, NP    Family History  Problem Relation Age of Onset   Heart attack Mother    Hyperlipidemia Mother    Hypertension Mother    Lung cancer Father    Diabetes Sister    Diabetes Brother    Asthma Daughter    Cancer Maternal Grandmother    Stroke Maternal Grandfather    Heart attack Paternal Grandfather      Social History   Tobacco Use   Smoking status: Never   Smokeless tobacco: Never  Vaping Use   Vaping status: Never Used  Substance Use Topics   Alcohol  use: Yes    Comment: rare   Drug use: Not Currently    Frequency: 2.0 times per week    Types: Cocaine    Comment: last used in 2015    Allergies as of 01/07/2024   (No Known Allergies)    Review of Systems:    All systems reviewed and negative except where noted in HPI.   Physical Exam:  Vital signs in last 24 hours: Temp:  [97.9 F (36.6 C)-98.8 F (37.1 C)] 97.9 F (36.6 C) (10/15 1110) Pulse Rate:  [104-123] 116 (10/15 1400) Resp:  [17-33] 22 (10/15 1400) BP: (72-120)/(55-93) 98/57 (10/15 1400) SpO2:  [89 %-100 %] 97 % (10/15 1456) Weight:  [82.3 kg] 82.3 kg (10/15 0721) Last BM Date : 02/21/24 General:    Pleasant, cooperative in NAD Head:  Normocephalic and atraumatic. Eyes:   No icterus.   Conjunctiva pink. PERRLA. Ears:  Normal auditory acuity. Neck:  Supple; no masses or thyroidomegaly Lungs: Diffuse decreased breath sounds with rhonchi. Heart:  Regular rate and rhythm;  Without murmur, clicks, rubs or gallops Abdomen:  Soft, nondistended, nontender. Normal bowel sounds. No appreciable masses or hepatomegaly.  No rebound or guarding.  Rectal:  Not performed. Msk:  Symmetrical without gross deformities.    Extremities:  Without edema, cyanosis or clubbing. Neurologic:  Alert and oriented x3;  grossly normal neurologically. Skin:  Intact without significant lesions or rashes. Cervical Nodes:  No significant cervical adenopathy. Psych:  Alert and cooperative. Normal affect.  LAB RESULTS: Recent Labs    02/23/24 1847 02/24/24 0612 02/24/24 1155  WBC 14.5* 10.2  --   HGB 10.4* 9.7* 8.3*  HCT 29.9* 29.1*  --   PLT 267 214  --    BMET Recent Labs    02/22/24 1904 02/23/24 1847 02/24/24 0612  NA  --  136 136  K  --  4.7 4.6  CL  --  99 104  CO2  --  25 21*  GLUCOSE  --  163* 121*  BUN  --  42* 36*  CREATININE 1.75* 1.82* 1.52*  CALCIUM   --  8.5* 8.0*   LFT Recent Labs    02/24/24 0612  PROT 5.6*  ALBUMIN 3.3*  AST 34  ALT 26  ALKPHOS 38  BILITOT 0.7   PT/INR Recent Labs    02/23/24 1847  LABPROT 14.5  INR 1.1    STUDIES: DG Chest Port 1 View Result Date: 02/24/2024 CLINICAL DATA:  [id number] Pulmonary edema [id number] EXAM: PORTABLE CHEST - 1 VIEW COMPARISON:  02/24/2024 5:50 a.m. FINDINGS: Low lung volumes. Similar streaky atelectasis in the perihilar regions bilaterally. No new airspace consolidation, pleural effusion, or pneumothorax. Mild cardiomegaly. Sternotomy wires and CABG markers. Aortic atherosclerosis. No acute fracture or destructive lesions. Multilevel thoracic osteophytosis. IMPRESSION: Low lung volumes.  Otherwise, no acute cardiopulmonary  abnormality. Electronically Signed   By: Rogelia Myers M.D.   On: 02/24/2024 09:23   CT HEAD WO CONTRAST ( ) Result Date: 02/24/2024 EXAM: CT HEAD WITHOUT CONTRAST 02/24/2024 07:11:12 AM TECHNIQUE: CT of the head was performed without the administration of intravenous contrast. Automated exposure control, iterative reconstruction, and/or weight based adjustment of the mA/kV was utilized to reduce the radiation dose to as low as reasonably achievable. COMPARISON: Head CT dated 11/23/2017. CLINICAL HISTORY: Mental status change, unknown cause. FINDINGS: BRAIN AND VENTRICLES: Mild to moderate cerebral atrophy, small vessel disease, and atrophic ventriculomegaly are again noted. There is relatively minimal cerebellar volume loss. No acute hemorrhage. No cortical based acute infarct. No mass effect or midline shift. No extra-axial collection. The basal cisterns are clear. There are calcifications in the distal right vertebral artery, the basilar artery, and both siphons. No hyperdense vessels. ORBITS: No acute abnormality. SINUSES: Paranasal sinuses and left mastoid air cells are clear. Trace chronic fluid again noted at the right mastoid tip. SOFT TISSUES AND SKULL: There are dorsal midline skin staples partially visible in the upper neck as well as a small amount of subcutaneous soft tissue gas, presumably left over from cervical laminoplasty done 2 days ago. No focal skull lesions or skull fractures are seen. IMPRESSION: 1. No acute intracranial CT findings. Stable exam. Atrophy and small vessel changes. 2. Postoperative changes in the upper neck with dorsal midline skin staples and small volume subcutaneous gas related to recent cervical laminoplasty. Electronically signed by: Francis Quam MD 02/24/2024 07:39 AM EDT RP Workstation: [id number]   CT ABDOMEN PELVIS WO CONTRAST Result Date: 02/24/2024 EXAM: CT ABDOMEN AND PELVIS WITHOUT CONTRAST 02/24/2024 07:11:12 AM TECHNIQUE:  CT of the abdomen and pelvis  was performed without the administration of intravenous contrast. Multiplanar reformatted images are provided for review. Automated exposure control, iterative reconstruction, and/or weight-based adjustment of the mA/kV was utilized to reduce the radiation dose to as low as reasonably achievable. COMPARISON: CT abdomen pelvis with iv contrast 02/16/2024. AP and lateral chest from 02/24/2024. CLINICAL HISTORY: Abdominal pain, acute, nonlocalized. FINDINGS: LOWER CHEST: Increased posterior atelectasis in both lower lobes. Increased bilateral lower lobe bronchial thickening and patchy hazy consolidation worrisome for bilateral pneumonia or aspiration. Subpleural linear scarring or atelectasis again noted in the lateral base of the right middle lobe with remaining lungs clear. CABG changes with native 3 vessel coronary calcifications and normal cardiac size. Moderate sized hiatal hernia. LIVER: The liver is mildly steatotic and otherwise unremarkable. GALLBLADDER AND BILE DUCTS: Gallbladder is unremarkable. No biliary ductal dilatation. SPLEEN: No acute abnormality. PANCREAS: No acute abnormality. ADRENAL GLANDS: No acute abnormality. KIDNEYS, URETERS AND BLADDER: No stones in the kidneys or ureters. No hydronephrosis. No perinephric or periureteral stranding. Urinary bladder is unremarkable. There is no contour deforming renal mass. GI AND BOWEL: There are retained food products in the proximal stomach and in the hiatal hernia. Normal caliber of the small bowel and appendix. Mild to moderate fecal stasis in the cecum and ascending colon. There are scattered descending and sigmoid diverticula but no evidence of diverticulitis. There is no bowel obstruction. PERITONEUM AND RETROPERITONEUM: No ascites. No free air. VASCULATURE: Aorta is normal in caliber. Moderate aortic atherosclerosis without aneurysm. LYMPH NODES: No lymphadenopathy. REPRODUCTIVE ORGANS: No acute abnormality. BONES AND SOFT TISSUES: Degenerative  changes thoracic and lumbar spine. No focal soft tissue abnormality. IMPRESSION: 1. Increased bilateral lower lobe bronchial thickening and patchy hazy consolidation, worrisome for bilateral pneumonia or aspiration. 2. Moderate sized hiatal hernia with retained food products in the proximal stomach and in the hernia. 3. Constipation and diverticulosis. 4. Aortic atherosclerosis. Electronically signed by: Francis Quam MD 02/24/2024 07:30 AM EDT RP Workstation: [id number]   DG Chest 2 View Result Date: 02/24/2024 EXAM: 2 VIEW(S) XRAY OF THE CHEST 02/24/2024 05:53:50 AM COMPARISON: Portable chest 10/09/2022. CLINICAL HISTORY: Chest pain [phone]. Chest pain/ pt had difficulty following instructions to remain still during exam. FINDINGS: LUNGS AND PLEURA: The lungs are expiratory. There is perihilar linear atelectasis. No focal pneumonia is evident. No substantial pleural effusion. No pneumothorax. No pulmonary edema. HEART AND MEDIASTINUM: The cardiac size is normal. Stable mediastinum with mild aortic atherosclerosis. No vascular congestion is seen. Sternotomy and cabg changes. BONES AND SOFT TISSUES: Kyphosis and degenerative change of the thoracic spine. No acute osseous abnormality. IMPRESSION: 1. No acute cardiopulmonary findings: Low lung volumes with perihilar atelectasis. . 2. Prior cabg. 3. Aortic atherosclerosis. Electronically signed by: Francis Quam MD 02/24/2024 06:29 AM EDT RP Workstation: [id number]   DG Abd 1 View Result Date: 02/23/2024 CLINICAL DATA:  Constipation EXAM: DG ABDOMEN 1V COMPARISON:  None Available. FINDINGS: Scattered large and small bowel gas is noted. No significant retained fecal material is noted. No abnormal mass or abnormal calcifications are seen. No free air is noted. Mild degenerative changes of lumbar spine are seen. IMPRESSION: No evidence of constipation.  No obstructive changes noted. Electronically Signed   By: Oneil Devonshire M.D.   On: 02/23/2024 15:46       Impression / Plan:   Assessment: Principal Problem:   Cervical myelopathy (HCC) Active Problems:   CAD with hx of CABG   Cervical spinal stenosis   Acute metabolic encephalopathy  Acute respiratory failure with hypoxia (HCC)   Nausea and vomiting   Billy Mazzuca. is a 73 y.o. y/o male with coffee-ground emesis and aspiration pneumonia with a CT scan showing retained food in the stomach bilateral pneumonia with a moderate-sized hiatal hernia.  The patient has had no black stools or bloody stools which may indicate that the patient may have had a limited upper GI bleed despite his drop in hemoglobin I would expect the patient to have passage from the lower GI tract of the melanotic stools.  This may just be too early and we may see melena in the future.  Plan:  Hiccups -patient should be monitored for continued hiccups and baclofen may be tried.  2.  The patient's upper GI bleed may be a Mallory-Weiss tear versus peptic ulcer disease or Cameron ulcers due to his hernia.  With the patient's increased oxygen demand and recent admission to the ICU I do not believe he is stable at this point for any endoluminal evaluation.  3.  The patient should be continued on a PPI .   4. Avoid NSAID's  PPI IV twice daily  Continue serial CBCs and transfuse PRN Avoid NSAIDs Maintain 2 large-bore IV lines Please page GI with any acute hemodynamic changes, or signs of active GI bleeding  Will consider an EGD if more bleeding or when stabilized.  Thank you for involving me in the care of this patient.      LOS: 2 days   Rogelia Copping, MD, MD. NOLIA 02/24/2024, 3:22 PM,  Pager [phone] 7am-5pm  Check AMION for 5pm -7am coverage and on weekends   Note: This dictation was prepared with Dragon dictation along with smaller phrase technology. Any transcriptional errors that result from this process are unintentional.

## 2024-02-24 NOTE — Significant Event (Signed)
 Rapid Response Event Note   Reason for Call :  AMS  Initial Focused Assessment:  Called to bedside to assess patient due to change in mental status. Upon arrival Pt having Shallowing breathing and slurred speech. BP 98/65 HR 124 O2 87% Bloos sugr 116. Comunicated the above concerns with Sheilla Wilhemina Sharps. Hospitlist were consulted Labs and scans ordered. Rapid called to bedside due to my concerns for patients work of breathing. Patient was placed on 4Ls Ncs O2 increased to 94. Dr Cleatus responsded to rapid to assess PT.  ABG ordered Labs done and PT transported to CT then  stepdown unit with no complications.   MD Notified:Brandon Sharps MD and  Colette cleatus MD Call [phone] Arrival [phone] End Upfz:[id number]  Lesley LOISE Shams, RN

## 2024-02-24 NOTE — Assessment & Plan Note (Addendum)
 Initially hypoxic to the 80s, per nurse at bedside Chest x-ray done about an hour earlier without acute findings Acute PE among differentials though low suspicion given main presenting symptom of altered mental status ABG on 2 L done during rapid unremarkable Continue supplemental oxygen ABG    Component Value Date/Time   PHART 7.42 02/24/2024 0630   PCO2ART 30 (L) 02/24/2024 0630   PO2ART 67 (L) 02/24/2024 0630   HCO3 19.5 (L) 02/24/2024 0630   TCO2 24 11/11/2021 1405   ACIDBASEDEF 3.9 (H) 02/24/2024 0630   O2SAT 96.2 02/24/2024 0630

## 2024-02-24 NOTE — Plan of Care (Signed)
  Problem: Coping: Goal: Level of anxiety will decrease Outcome: Progressing   Problem: Elimination: Goal: Will not experience complications related to urinary retention Outcome: Progressing   Problem: Safety: Goal: Ability to remain free from injury will improve Outcome: Progressing   Problem: Activity: Goal: Ability to avoid complications of mobility impairment will improve Outcome: Progressing Goal: Ability to tolerate increased activity will improve Outcome: Progressing Goal: Will remain free from falls Outcome: Progressing   Problem: Pain Management: Goal: Pain level will decrease Outcome: Progressing   Problem: Bladder/Genitourinary: Goal: Urinary functional status for postoperative course will improve Outcome: Progressing

## 2024-02-24 NOTE — Assessment & Plan Note (Addendum)
 Reported coffee ground emesis on 10/14 Hemoglobin with slight downtrend Consider GI consult Will keep n.p.o. for now We will get CT abdomen and pelvis IV Protonix     Latest Ref Rng & Units 02/24/2024    6:12 AM 02/23/2024    6:47 PM 02/17/2024    9:07 AM  CBC  WBC 4.0 - 10.5 K/uL 10.2  14.5  5.9   Hemoglobin 13.0 - 17.0 g/dL 9.7  89.5  86.2   Hematocrit 39.0 - 52.0 % 29.1  29.9  40.0   Platelets 150 - 400 K/uL 214  267  213

## 2024-02-24 NOTE — Consult Note (Addendum)
 NAME:  Billy Levitt., MRN:  [id number], DOB:  02-29-1951, LOS: 2 ADMISSION DATE:  02/22/2024, CONSULTATION DATE:  02/24/2024 REFERRING MD:  Reeves Daisy, CHIEF COMPLAINT:  Altered Mental Status & hypotension   History of Present Illness:  Billy Cisneros is a 73 y.o. male who is S/P posterior cervical laminoplasty (02/22/24) for cervical myelopathy and stenosis. On post-op day 1 it is reported he experienced urinary retention requiring I&O cath, nausea and vomiting, and coffee ground emesis. Today, post-op day 2, he developed altered mental status and later difficulty to arouse with slurred speech. Additionally he became hypotension and hypoxic, to which a rapid response was called. He was placed on nasal cannula to improve his oxygenation.  PCCM was consulted for evaluation of AMS, hypotension and hypoxia.  Pertinent  Medical History  Coronary artery disease - CABG x 5 Hypertension Hyperlipidemia Spinal Stenosis Chronic Kidney Disease Stage 3 Degenerative Disc Disease Depression & Anxiety Hepatic Steatosis GERD Hypothyroidism NSTEMI (10/09/2022) Insomnia Hiatal & Umbilical Hernia History of cocaine abuse   Significant Hospital Events: Including procedures, antibiotic start and stop dates in addition to other pertinent events   02/22/24: Admitted to [facility] S/P cervical laminectomy 02/23/24: Admitted to the floor, developed post-op nausea and vomiting, coffee ground emesis and mild confusion in the afternoon. 02/24/24: Increased altered mental status and slurred speech. It was noted he was given multiple medications in the previous 24 hours that may have contributed to his mental status. Hypotension and hypoxia developed, rapid response was called. Hospitalist evaluated the pt and consulted PCCM for further evaluation of hypotension and hypoxia.   Interim History / Subjective:  See above listed under Significant hospital events.   Micro Data: 02/24/24: Blood Cultures x  2 >>   Objective    Blood pressure (!) 98/57, pulse (!) 116, temperature 97.9 F (36.6 C), temperature source Axillary, resp. rate (!) 22, height 5' 5 (1.651 m), weight 82.3 kg, SpO2 100%.        Intake/Output Summary (Last 24 hours) at 02/24/2024 1449 Last data filed at 02/24/2024 1320 Gross per 24 hour  Intake 1447.01 ml  Output 1827 ml  Net -379.99 ml   Filed Weights   02/24/24 0721  Weight: 82.3 kg    Examination: General: resting in bed, no acute distress HENT: atraumatic, normocephalic, neck supple, no JVD Lungs: diminished breath sounds bilaterally, equal throughout Cardiovascular: RRR, S1 S2, no m/r/g Abdomen: bowel sounds x 4, distended, non-tender, no rebound tenderness Extremities: dry/intact, radial pulses 2+, distal pulses 1+, trace edema, compression stockings present Neuro: alert and oriented x 4, mild slurred speech, no other focal neuro deficits GU: deferred  Resolved problem list   Assessment and Plan   #Acute Metabolic Encephalopathy #Spinal Stenosis S/P Cervical Laminectomy 02/22/24 Hx: Depression, Anxiety, Cervical Stenosis, Degenerative Disease Disease, Vitamin B12 Deficiency 03/05/24 Head CT: negative for acute intracranial abnormalities - Avoid sedative medications as able - Fall precautions - Continue home meds: Abilify and Wellbutrin  - Continue pain medications as indicated by neurosurgery - Neuro checks Q4H - Neurosurgery following   #Acute Hypoxic Respiratory Failure 02/24/24 Abd/Pelvis CT: increased bilateral lower lobe bronchial thickening and patchy hazy consolidation, worrisome for bilateral pneumonia or aspiration 02/24/24 CXR: low lung volumes. No acute cardiopulmonary abnormality. - Supplemental O2 to maintain sats >92% - Remains on 4L [place] with improving oxygenation - Bronchodilators as needed - Continue Brovana and Pulmicort - Duonebs Q6H - Solumedrol 125mg  x 1 - Aspiration precautions - Incentive spirometry - Intermittent  CXR and  ABG as indicated - Bilateral LE US  doppler pending   #Sepsis secondary to Aspiration Pneumonia - Trend WBC and monitor fever curve - Leukocytosis improved today - Blood cultures pending - ABX: Will switch Unasyn to Zosyn - Appreciate pharmacy input  - Lactic pending   #Coronary Artery Disease S/P CABG #Hypotension secondary to Sepsis Hx: CABG x 5, HTN, Hyperlipidemia - Continuous cardiac monitoring - Vasopressors to maintain MAP goal of >65 ~ not currently requiring - Continue IV fluids: bolus x 1, now NS infusion - Avoid excess fluids given respiratory failure (BNP 24.5) - Hold home antihypertensives for now - Midodrine ordered, will administer once mental status has improved   #Acute Kidney Injury on CKD Stage 3 #Urinary Retention #Hypophosphatemia #Hypomagnesemia Hx: CKD Stage 3 - Strict I/O - Trend BMP and renal function - Creatinine improving (1.82, 1.52) - Electrolyte replacement protocol as indicated for metabolic derangements - Foley catheter placed for urinary retention   #?Upper GI Bleed (Coffee Ground Emesis) #Post-Op Nausea and Vomiting #GERD Hx: Hiatal & Umbilical Hernia, GERD - Constipation protocol as indicated - Continue antiemetics as needed - Hold home aspirin  until GI bleed ruled out - Continue PPI: Protonix  - GI consult: ?Mallory-Weiss tear vs. PUD vx. Cameron ulcers d/t hernia - GI does not think he is stable at this point for endoluminal evaluation - Consider EGD when hemodynamically stable - Diet: NPO   #Iron Deficiency Anemia Hx: Iron Deficiency Anemia - Trend CBC - Monitor H&H - Transfuse if Hgb <7 - Monitor for s/sx bleeding - Continue oral iron supplements with vitamin C once hemodynamically stable - DVT prophylaxis: Lovenox     #Hypothyroidism #Hyperglycemia - Hypo/hyperglycemia protocol - Goal BG 140-180 - SSI as indicated - Continue home medication: Levothyroxine   #Vitamin B12 Deficiency - B12 injection x 7  days followed by B12 tablets every day   If patient does not require a levophed gtt, PCCM team will sign off on 10/16.  Labs   CBC: Recent Labs  Lab 02/23/24 1847 02/24/24 0612 02/24/24 1155  WBC 14.5* 10.2  --   HGB 10.4* 9.7* 8.3*  HCT 29.9* 29.1*  --   MCV 90.9 92.4  --   PLT 267 214  --     Basic Metabolic Panel: Recent Labs  Lab 02/22/24 1904 02/23/24 1847 02/24/24 0612  NA  --  136 136  K  --  4.7 4.6  CL  --  99 104  CO2  --  25 21*  GLUCOSE  --  163* 121*  BUN  --  42* 36*  CREATININE 1.75* 1.82* 1.52*  CALCIUM   --  8.5* 8.0*  MG  --   --  1.6*  PHOS  --   --  1.9*   GFR: Estimated Creatinine Clearance: 44 mL/min (A) (by C-G formula based on SCr of 1.52 mg/dL (H)). Recent Labs  Lab 02/23/24 1847 02/24/24 0612  WBC 14.5* 10.2    Liver Function Tests: Recent Labs  Lab 02/23/24 1847 02/24/24 0612  AST 40 34  ALT 33 26  ALKPHOS 48 38  BILITOT 0.8 0.7  PROT 6.0* 5.6*  ALBUMIN 3.4* 3.3*   No results for input(s): LIPASE, AMYLASE in the last 168 hours. No results for input(s): AMMONIA in the last 168 hours.  ABG    Component Value Date/Time   PHART 7.42 02/24/2024 0630   PCO2ART 30 (L) 02/24/2024 0630   PO2ART 67 (L) 02/24/2024 0630   HCO3 19.5 (L) 02/24/2024 0630   TCO2  24 11/11/2021 1405   ACIDBASEDEF 3.9 (H) 02/24/2024 0630   O2SAT 96.2 02/24/2024 0630     Coagulation Profile: Recent Labs  Lab 02/23/24 1847  INR 1.1    Cardiac Enzymes: No results for input(s): CKTOTAL, CKMB, CKMBINDEX, TROPONINI in the last 168 hours.  HbA1C: Hemoglobin A1C  Date/Time Value Ref Range Status  08/07/2016 10:55 AM 5.3  Final    CBG: Recent Labs  Lab 02/24/24 0636 02/24/24 0717  GLUCAP 116* 116*    Review of Systems:   Unable to provide specific concerns other than neck stiffness and mild pain.   Past Medical History:  He,  has a past medical history of Anxiety, Cervical myelopathy (HCC), CKD (chronic kidney disease)  stage 3, GFR 30-59 ml/min (HCC), Coronary artery disease (03/15/1999), DDD (degenerative disc disease), thoracolumbar, Depression, Diverticulosis, Duodenal diverticulum, GERD (gastroesophageal reflux disease), Gout, Hepatic steatosis, Hiatal hernia, History of cocaine abuse (HCC), Hyperlipidemia, Hypertension, Hypothyroidism, Insomnia, Long-term use of aspirin  therapy, Nausea and vomiting (02/24/2024), NSTEMI (non-ST elevated myocardial infarction) (HCC) (10/09/2022), S/P CABG x 5 (03/18/1999), Spinal stenosis of cervical region with radiculopathy, Umbilical hernia, and Unstable angina (HCC) (11/11/2021).   Surgical History:   Past Surgical History:  Procedure Laterality Date   CERVICAL LAMINOPLASTY N/A 02/22/2024   Procedure: CERVICAL LAMINOPLASTY;  Surgeon: Clois Fret, MD;  Location: [facility] ORS;  Service: Neurosurgery;  Laterality: N/A;  C3-7 LAMINOPLASTY   COLONOSCOPY WITH PROPOFOL  N/A 05/25/2017   Procedure: COLONOSCOPY WITH PROPOFOL ;  Surgeon: Therisa Bi, MD;  Location: [facility] ENDOSCOPY;  Service: Gastroenterology;  Laterality: N/A;   CORONARY ARTERY BYPASS GRAFT N/A 03/18/1999   Procedure: CORONARY ARTERY BYPASS GRAFT; Location: [place]   ESOPHAGOGASTRODUODENOSCOPY N/A 10/27/2017   Procedure: ESOPHAGOGASTRODUODENOSCOPY (EGD);  Surgeon: Unk Corinn Skiff, MD;  Location: [facility] ENDOSCOPY;  Service: Gastroenterology;  Laterality: N/A;   ESOPHAGOGASTRODUODENOSCOPY (EGD) WITH PROPOFOL  N/A 11/19/2017   Procedure: ESOPHAGOGASTRODUODENOSCOPY (EGD) WITH PROPOFOL ;  Surgeon: Unk Corinn Skiff, MD;  Location: [facility] ENDOSCOPY;  Service: Gastroenterology;  Laterality: N/A;   LEFT HEART CATH AND CORONARY ANGIOGRAPHY Left 03/15/1999   Procedure: LEFT HEART CATH AND CORONARY ANGIOGRAPHY; Location: [facility]   LEFT HEART CATH AND CORS/GRAFTS ANGIOGRAPHY N/A 10/14/2019   Procedure: LEFT HEART CATH AND CORS/GRAFTS ANGIOGRAPHY;  Surgeon: Claudene Victory ORN, MD;  Location: [facility] INVASIVE CV LAB;  Service:  Cardiovascular;  Laterality: N/A;   LEFT HEART CATH AND CORS/GRAFTS ANGIOGRAPHY N/A 11/11/2021   Procedure: LEFT HEART CATH AND CORS/GRAFTS ANGIOGRAPHY;  Surgeon: Mady Bruckner, MD;  Location: [facility] INVASIVE CV LAB;  Service: Cardiovascular;  Laterality: N/A;   LEFT HEART CATH AND CORS/GRAFTS ANGIOGRAPHY N/A 10/10/2022   Procedure: LEFT HEART CATH AND CORS/GRAFTS ANGIOGRAPHY;  Surgeon: Swaziland, Peter M, MD;  Location: [facility] INVASIVE CV LAB;  Service: Cardiovascular;  Laterality: N/A;   LEFT HEART CATH AND CORS/GRAFTS ANGIOGRAPHY Left 03/29/2003   Procedure: LEFT HEART CATH AND CORS/GRAFTS ANGIOGRAPHY; Location: [facility]; Surgeon: Vinie Jude, MD   LEFT HEART CATH AND CORS/GRAFTS ANGIOGRAPHY Left 03/12/2006   Procedure: LEFT HEART CATH AND CORS/GRAFTS ANGIOGRAPHY; Location: [facility]; Surgeon: Wolm Rhyme, MD   LEFT HEART CATH AND CORS/GRAFTS ANGIOGRAPHY Left 01/12/2007   Procedure: LEFT HEART CATH AND CORS/GRAFTS ANGIOGRAPHY; Location: [facility]; Surgeon: Wolm Rhyme, MD   LEFT HEART CATH AND CORS/GRAFTS ANGIOGRAPHY Left 05/02/2010   Procedure: LEFT HEART CATH AND CORS/GRAFTS ANGIOGRAPHY; Location: [facility]; Surgeon: Marsa Dooms, MD     Social History:   reports that he has never smoked. He has never used smokeless tobacco. He reports current alcohol  use.  He reports that he does not currently use drugs after having used the following drugs: Cocaine. Frequency: 2.00 times per week.   Family History:  His family history includes Asthma in his daughter; Cancer in his maternal grandmother; Diabetes in his brother and sister; Heart attack in his mother and paternal grandfather; Hyperlipidemia in his mother; Hypertension in his mother; Lung cancer in his father; Stroke in his maternal grandfather.   Allergies No Known Allergies   Home Medications  Prior to Admission medications   Medication Sig Start Date End Date Taking? Authorizing Provider  allopurinol (ZYLOPRIM) 100 MG tablet Take 100 mg by mouth  daily. 08/25/21  Yes [provider]  ALPRAZolam  (XANAX ) 0.5 MG tablet TAKE 1 TABLET BY MOUTH 3 TIMES A DAY AS NEEDED FOR ANXIETY. 06/24/17  Yes Maree Leni Edyth DELENA, MD  ARIPiprazole (ABILIFY) 2 MG tablet Take 2 mg by mouth at bedtime. 12/25/23  Yes [provider]  aspirin  EC 81 MG tablet Take 1 tablet (81 mg total) by mouth daily. For heart attack prevention. 11/22/11  Yes Mashburn, Aureliano DASEN, PA-C  buPROPion  (WELLBUTRIN  XL) 300 MG 24 hr tablet Take 1 tablet (300 mg total) by mouth daily. 06/24/17  Yes Maree Leni Edyth DELENA, MD  colchicine  0.6 MG tablet Take 0.6 mg by mouth daily as needed (gout). 08/24/21  Yes [provider]  isosorbide  mononitrate (IMDUR ) 60 MG 24 hr tablet Take 1 tablet (60 mg total) by mouth daily. Patient taking differently: Take 60 mg by mouth at bedtime. 12/24/23  Yes Hilty, Vinie BROCKS, MD  levothyroxine (SYNTHROID) 25 MCG tablet Take 25 mcg by mouth daily before breakfast. 12/17/23 12/16/24 Yes [provider]  lisinopril  (PRINIVIL ,ZESTRIL ) 10 MG tablet Take 1 tablet (10 mg total) by mouth daily. Patient taking differently: Take 10 mg by mouth every morning. 06/24/17  Yes Maree Leni Edyth DELENA, MD  metoprolol  succinate (TOPROL  XL) 25 MG 24 hr tablet Take 1 tablet (25 mg total) by mouth daily. Patient taking differently: Take 25 mg by mouth every morning. 12/31/23  Yes Cleaver, Josefa HERO, NP  Multiple Vitamins-Minerals (MULTIVITAMIN WITH MINERALS) tablet Take 1 tablet by mouth at bedtime.   Yes [provider]  omega-3 acid ethyl esters (LOVAZA ) 1 g capsule TAKE ONE CAPSULE BY MOUTH TWICE A DAY 12/24/23  Yes Hilty, Vinie BROCKS, MD  omeprazole  (PRILOSEC) 20 MG capsule Take 1 capsule (20 mg total) by mouth 2 (two) times daily before a meal. 06/24/17  Yes Maree Leni Edyth DELENA, MD  predniSONE  (DELTASONE ) 10 MG tablet Take 10 mg by mouth daily as needed (gout flare). 07/04/21  Yes [provider]  rosuvastatin  (CRESTOR ) 20 MG tablet Take 1 tablet (20 mg total)  by mouth at bedtime. 10/03/22  Yes Hilty, Vinie BROCKS, MD  valACYclovir  (VALTREX ) 500 MG tablet Take 500 mg by mouth 2 (two) times daily as needed (outbreak).    Yes [provider]  zolpidem  (AMBIEN ) 5 MG tablet Take 5 mg by mouth at bedtime as needed. 01/25/24  Yes [provider]  nitroGLYCERIN  (NITROSTAT ) 0.4 MG SL tablet Place 1 tablet (0.4 mg total) under the tongue every 5 (five) minutes as needed for chest pain. 12/31/23   Emelia Josefa HERO, NP     Critical care time: 60 minutes     Ivan Maskell, PA-C Pulmonary/Critical Care PCCM Team Contact Info: [phone]

## 2024-02-24 NOTE — Progress Notes (Addendum)
 Dr. Von at bedside. MD aware patient tachycardic and tachypneic. 4L [place] with soft BPs. Patient also continues to have slurred speech and confusion. See new orders.

## 2024-02-24 NOTE — Progress Notes (Signed)
 Occupational Therapy Treatment Patient Details Name: Billy Cisneros. MRN: [id number] DOB: 05/25/1950 Today's Date: 02/24/2024   History of present illness Billy Cisneros. Is s/p C3-7 laminoplasty. Rapid response called 10/14 and pt transferred to ICU with AMS.   OT comments  Upon entering the room, pt supine in bed and agreeable to OT intervention. Pt's wife present in room. Pt is confused with speech being difficult to understand. Log roll requiring mod - max A to get to EOB from supine. Static sitting balance with close supervision. Pt stands with min A but is impulsive with movement. Increased pain in neck and shoulders once leaving bed. Pt returning to bed level able to perform oral care with hand over hand technique.Pt having difficulty remaining awake during session. Pt returning to supine with max A. Call bell and all needed items within reach upon exiting the room.       If plan is discharge home, recommend the following:  Assistance with cooking/housework;Assist for transportation;Help with stairs or ramp for entrance;A lot of help with bathing/dressing/bathroom;A lot of help with walking and/or transfers   Equipment Recommendations  None recommended by OT       Precautions / Restrictions Precautions Precautions: Cervical       Mobility Bed Mobility Overal bed mobility: Needs Assistance Bed Mobility: Sidelying to Sit, Sit to Sidelying   Sidelying to sit: Mod assist Supine to sit: Mod assist          Transfers Overall transfer level: Needs assistance Equipment used: 1 person hand held assist Transfers: Sit to/from Stand Sit to Stand: Min assist                 Balance Overall balance assessment: Needs assistance Sitting-balance support: Feet supported, Single extremity supported Sitting balance-Leahy Scale: Good     Standing balance support: Single extremity supported Standing balance-Leahy Scale: Fair                              ADL either performed or assessed with clinical judgement   ADL Overall ADL's : Needs assistance/impaired     Grooming: Oral care;Sitting;Set up;Supervision/safety                                      Extremity/Trunk Assessment Upper Extremity Assessment Upper Extremity Assessment: Generalized weakness   Lower Extremity Assessment Lower Extremity Assessment: Generalized weakness   Cervical / Trunk Assessment Cervical / Trunk Assessment: Normal    Vision Patient Visual Report: No change from baseline           Communication Communication Communication: Impaired Factors Affecting Communication: Hearing impaired   Cognition Arousal: Alert Behavior During Therapy: Impulsive Cognition: No apparent impairments                               Following commands: Intact        Cueing   Cueing Techniques: Verbal cues             Pertinent Vitals/ Pain       Pain Assessment Pain Assessment: Faces Faces Pain Scale: Hurts even more Pain Location: neck and bilateral shoulders Pain Descriptors / Indicators: Dull, Aching, Sore, Discomfort Pain Intervention(s): Limited activity within patient's tolerance, Monitored during session, Repositioned         Frequency  Min 2X/week        Progress Toward Goals  OT Goals(current goals can now be found in the care plan section)  Progress towards OT goals: Progressing toward goals      AM-PAC OT 6 Clicks Daily Activity     Outcome Measure   Help from another person eating meals?: A Little Help from another person taking care of personal grooming?: A Little Help from another person toileting, which includes using toliet, bedpan, or urinal?: A Lot Help from another person bathing (including washing, rinsing, drying)?: A Lot Help from another person to put on and taking off regular upper body clothing?: A Little Help from another person to put on and taking off regular lower body  clothing?: A Lot 6 Click Score: 15    End of Session    OT Visit Diagnosis: Unsteadiness on feet (R26.81);Repeated falls (R29.6);Muscle weakness (generalized) (M62.81)   Activity Tolerance Patient limited by fatigue   Patient Left in bed;with call bell/phone within reach;with bed alarm set   Nurse Communication Mobility status        Time: 8889-8866 OT Time Calculation (min): 23 min  Charges: OT General Charges $OT Visit: 1 Visit OT Treatments $Self Care/Home Management : 8-22 mins $Therapeutic Activity: 8-22 mins  Izetta Claude, MS, OTR/L , CBIS ascom [phone]  02/24/24, 1:03 PM

## 2024-02-24 NOTE — Progress Notes (Signed)
 Patient begin complaining of chest pain 7/10, radiating to left arm. Notified NP [place]. EKG completed. Order received for sublingual nitroglycerin  and serial troponins.

## 2024-02-24 NOTE — Evaluation (Signed)
 Clinical/Bedside Swallow Evaluation Patient Details  Name: Billy Cisneros. MRN: [id number] Date of Birth: Feb 02, 1951  Today's Date: 02/24/2024 Time: SLP Start Time (ACUTE ONLY): 1430 SLP Stop Time (ACUTE ONLY): 1520 SLP Time Calculation (min) (ACUTE ONLY): 50 min  Past Medical History:  Past Medical History:  Diagnosis Date   Anxiety    a.) on BZO (alprazolam ) PRN   Cervical myelopathy (HCC)    CKD (chronic kidney disease) stage 3, GFR 30-59 ml/min (HCC)    Coronary artery disease 03/15/1999   a.) LHC 03/15/1999: multi-vessel CAD --> CVTS; b.) s/p 5v CABG 03/18/1999; c.) LHC 03/29/2003, 03/12/2006, 01/12/2007, 05/02/2010 - MV Dz with patent bypass grafts x 5; d.) LHC 10/14/2019: MV Dz with CTO OM1; e.) LHC 11/11/2021: MV Dz with CTO SVG-OM1 and SVG-D1; f.) NSTEMI 10/09/2022 --> LHC 10/10/2022: MV Dz with CTO OM1 and SVG D1 - med mgmt   DDD (degenerative disc disease), thoracolumbar    Depression    Diverticulosis    Duodenal diverticulum    GERD (gastroesophageal reflux disease)    Gout    Hepatic steatosis    Hiatal hernia    History of cocaine abuse (HCC)    last used in 2015 per pt (as of 02-15-24)   Hyperlipidemia    Hypertension    Hypothyroidism    Insomnia    a.) on hypnotic (zolpidem ) PRN   Long-term use of aspirin  therapy    Nausea and vomiting 02/24/2024   NSTEMI (non-ST elevated myocardial infarction) (HCC) 10/09/2022   S/P CABG x 5 03/18/1999   a.) LIMA-LAD, RIMA-RCA, SVG-OM1, SVG-D1, SVG-RI   Spinal stenosis of cervical region with radiculopathy    Umbilical hernia    Unstable angina (HCC) 11/11/2021   Past Surgical History:  Past Surgical History:  Procedure Laterality Date   CERVICAL LAMINOPLASTY N/A 02/22/2024   Procedure: CERVICAL LAMINOPLASTY;  Surgeon: Clois Fret, MD;  Location: [facility] ORS;  Service: Neurosurgery;  Laterality: N/A;  C3-7 LAMINOPLASTY   COLONOSCOPY WITH PROPOFOL  N/A 05/25/2017   Procedure: COLONOSCOPY WITH PROPOFOL ;   Surgeon: Therisa Bi, MD;  Location: [facility] ENDOSCOPY;  Service: Gastroenterology;  Laterality: N/A;   CORONARY ARTERY BYPASS GRAFT N/A 03/18/1999   Procedure: CORONARY ARTERY BYPASS GRAFT; Location: [place] [facility]   ESOPHAGOGASTRODUODENOSCOPY N/A 10/27/2017   Procedure: ESOPHAGOGASTRODUODENOSCOPY (EGD);  Surgeon: Unk Corinn Skiff, MD;  Location: [facility] ENDOSCOPY;  Service: Gastroenterology;  Laterality: N/A;   ESOPHAGOGASTRODUODENOSCOPY (EGD) WITH PROPOFOL  N/A 11/19/2017   Procedure: ESOPHAGOGASTRODUODENOSCOPY (EGD) WITH PROPOFOL ;  Surgeon: Unk Corinn Skiff, MD;  Location: [facility] ENDOSCOPY;  Service: Gastroenterology;  Laterality: N/A;   LEFT HEART CATH AND CORONARY ANGIOGRAPHY Left 03/15/1999   Procedure: LEFT HEART CATH AND CORONARY ANGIOGRAPHY; Location: [facility]   LEFT HEART CATH AND CORS/GRAFTS ANGIOGRAPHY N/A 10/14/2019   Procedure: LEFT HEART CATH AND CORS/GRAFTS ANGIOGRAPHY;  Surgeon: Claudene Victory ORN, MD;  Location: [facility] INVASIVE CV LAB;  Service: Cardiovascular;  Laterality: N/A;   LEFT HEART CATH AND CORS/GRAFTS ANGIOGRAPHY N/A 11/11/2021   Procedure: LEFT HEART CATH AND CORS/GRAFTS ANGIOGRAPHY;  Surgeon: Mady Bruckner, MD;  Location: [facility] INVASIVE CV LAB;  Service: Cardiovascular;  Laterality: N/A;   LEFT HEART CATH AND CORS/GRAFTS ANGIOGRAPHY N/A 10/10/2022   Procedure: LEFT HEART CATH AND CORS/GRAFTS ANGIOGRAPHY;  Surgeon: Swaz[place], Peter M, MD;  Location: [facility] INVASIVE CV LAB;  Service: Cardiovascular;  Laterality: N/A;   LEFT HEART CATH AND CORS/GRAFTS ANGIOGRAPHY Left 03/29/2003   Procedure: LEFT HEART CATH AND CORS/GRAFTS ANGIOGRAPHY; Location: [facility]; Surgeon: Vinie Jude, MD  LEFT HEART CATH AND CORS/GRAFTS ANGIOGRAPHY Left 03/12/2006   Procedure: LEFT HEART CATH AND CORS/GRAFTS ANGIOGRAPHY; Location: [facility]; Surgeon: Wolm Rhyme, MD   LEFT HEART CATH AND CORS/GRAFTS ANGIOGRAPHY Left 01/12/2007   Procedure: LEFT HEART CATH AND CORS/GRAFTS ANGIOGRAPHY; Location: [facility]; Surgeon: Wolm Rhyme, MD   LEFT HEART CATH AND CORS/GRAFTS ANGIOGRAPHY Left 05/02/2010   Procedure: LEFT HEART CATH AND CORS/GRAFTS ANGIOGRAPHY; Location: [facility]; Surgeon: Marsa Dooms, MD   HPI:  Pt is a 73 y.o. male with PMH of CAD s/p CABG, NSTEMI, unstable angina, HTN, HLD, hypothyroidism, CKD III, Obesity, GERD, MOD Hiatal Hernia, cervical spinal stenosis with radiculopathy and myelopathy, thoracolumbar DDD, depression, anxiety, insomnia admitted on 02/22/2024 for surgical intervention for spinal stenosis and status post laminectomy on the 13th of this month.  The patient had a rapid response called after he had Vomited yesterday with coffee-ground emesis and this morning became Confused with difficulty to arouse and slurred speech.  The patient was transferred to the ICU with what appears to be possible aspiration pneumonia from the N/V episode.  The patient has also been having the hiccups.   CT Scan of the Abd: Increased bilateral lower lobe bronchial thickening and patchy hazy  consolidation, worrisome for Bilateral pneumonia or aspiration; increased posterior atelectasis in both lower lobes.  2. Moderate sized hiatal hernia with retained food products in the proximal  stomach and in the hernia.  3. Constipation and diverticulosis.  4. Aortic atherosclerosis.   Head CT: No acute intracranial CT findings. Stable exam. Atrophy and small vessel  changes.  2. Postoperative changes in the upper neck with dorsal midline skin staples and  small volume subcutaneous gas related to recent cervical laminoplasty.    Assessment / Plan / Recommendation  Clinical Impression   Pt seen for BSE this afternoon. NSG reported a min distracted Cognitive status currently. He continues to be NPO at this time. Pt awake, verbal. Min distracted, talkative but redirected his attention to task w/ min cues from this SLP. Pt has Baseline h/o Esophageal phase Dysmotility and GERD w/ Dilation x2-3 per Wife/pt. Tachycardic currently(HR  112-120 this afternoon per chart).  On [place] O2 4L; afebrile. WBC WNL.  Pt had recent N/V events per chart, report. NP desires to give PO meds if possible.   Pt appears to present w/ functional oropharyngeal phase swallowing w/ limited po trials at this session; No oropharyngeal phase dysphagia noted, No neuromuscular deficits noted. Pt consumed po trials w/ No immediate, overt clinical s/s of aspiration during the po trials.  Pt appears at reduced risk for aspiration when following general aspiration precautions. However, in setting of current tachycardia and recent N/V, NP agreed w/ not starting an oral diet at this time until pt's medical status has stabilized further. Pt does have challenging factors that could impact oropharyngeal swallowing to include discomfort(NSG aware) from recent laminectomy, deconditioning/weakness, and need for O2 support d/t Pulmonary status s/p N/V episodes(w/ aspiration concern). Pt also has a MOD Hiatal Hernia. These factors can increase risk for dysphagia as well as decreased oral intake overall.   During the limited po trials, pt consumed ice chips and puree consistencies w/ no overt coughing, decline in vocal quality, or change in respiratory presentation during/post trials. O2 sats remained 97-98%. Oral phase appeared grossly [facility] w/ timely bolus management and control of bolus propulsion for A-P transfer for swallowing. Oral clearing achieved w/ trial consistencies. OM Exam appeared [facility] w/ no unilateral weakness noted. Speech Clear. Pt required feeding support d/t  distractions.   Recommend continue w/ NPO status for tonight as per NP w/ ongoing trials to upgrade to oral diet tomorrow hopefully. Recommend Pleasure PRN ice chips w/ general aspiration precautions; Pills WHOLE in Puree for safer, easier swallowing -- pt did this during this session w/ success.  Education given on Pills in Puree; Pleasure ice chips(1-2 at a time); general aspiration precautions to pt and Wife.  NSG updated and agreed. GI present at end of session. Recommend Dietician f/u for support. SLP Visit Diagnosis: Dysphagia, unspecified (R13.10) (Baseline Esophageal phase Dysmotility w/ MOD Hiatal Hernia)    Aspiration Risk   (reduced from an oropharyngeal phase standpoint)    Diet Recommendation   NPO (except ice chips; tsps of Puree for Meds w/ NSG) - general aspiration precautions w/ 1-2 ice chips at a time  Medication Administration: Whole meds with puree    Other  Recommendations Recommended Consults: Consider GI evaluation;Consider esophageal assessment (Dietician) Oral Care Recommendations: Oral care QID;Oral care prior to ice chip/H20;Patient independent with oral care;Staff/trained caregiver to provide oral care     Assistance Recommended at Discharge  FULL currently  Functional Status Assessment Patient has had a recent decline in their functional status and demonstrates the ability to make significant improvements in function in a reasonable and predictable amount of time.  Frequency and Duration min 2x/week  2 weeks       Prognosis Prognosis for improved oropharyngeal function: Good Barriers/Prognosis Comment: Baseline Esophageal phase Dysmotility w/ MOD Hiatal Hernia      Swallow Study   General Date of Onset: 02/22/24 HPI: Pt is a 73 y.o. male with PMH of CAD s/p CABG, NSTEMI, unstable angina, HTN, HLD, hypothyroidism, CKD III, Obesity, GERD, MOD Hiatal Hernia, cervical spinal stenosis with radiculopathy and myelopathy, thoracolumbar DDD, depression, anxiety, insomnia admitted on 02/22/2024 for surgical intervention for spinal stenosis and status post laminectomy on the 13th of this month.  The patient had a rapid response called after he had Vomited yesterday with coffee-ground emesis and this morning became Confused with difficulty to arouse and slurred speech.  The patient was transferred to the ICU with what appears to be possible aspiration pneumonia from the N/V  episode.  The patient has also been having the hiccups.   CT Scan of the Abd: Increased bilateral lower lobe bronchial thickening and patchy hazy  consolidation, worrisome for Bilateral pneumonia or aspiration; increased posterior atelectasis in both lower lobes.  2. Moderate sized hiatal hernia with retained food products in the proximal  stomach and in the hernia.  3. Constipation and diverticulosis.  4. Aortic atherosclerosis.   Head CT: No acute intracranial CT findings. Stable exam. Atrophy and small vessel  changes.  2. Postoperative changes in the upper neck with dorsal midline skin staples and  small volume subcutaneous gas related to recent cervical laminoplasty. Type of Study: Bedside Swallow Evaluation Previous Swallow Assessment: none Diet Prior to this Study: NPO Temperature Spikes Noted: No (wbc 10.2) Respiratory Status: Nasal cannula (4L) History of Recent Intubation: Yes (for surgical procedure) Total duration of intubation (days): 1 days (few hours) Date extubated: 02/22/24 Behavior/Cognition: Alert;Cooperative;Pleasant mood;Distractible;Requires cueing Oral Cavity Assessment: Within Functional Limits Oral Care Completed by SLP: Yes Oral Cavity - Dentition: upper/lower dentures, in place Vision:  (n/a) Self-Feeding Abilities: Total assist Patient Positioning: Upright in bed (FULL assist/support) Baseline Vocal Quality: Normal Volitional Cough: Strong Volitional Swallow: Able to elicit    Oral/Motor/Sensory Function Overall Oral Motor/Sensory Function: Within functional limits   Ice Chips Ice  chips: Within functional limits Presentation: Spoon (fed; 6 trials)   Thin Liquid Thin Liquid: Not tested    Nectar Thick Nectar Thick Liquid: Not tested   Honey Thick Honey Thick Liquid: Not tested   Puree Puree: Within functional limits Presentation: Spoon (5 trials; fed)   Solid     Solid: Not tested        Comer Portugal, MS, CCC-SLP Speech Language Pathologist Rehab  Services; [facility] [place] [phone] (ascom) Allaya Abbasi 02/24/2024,4:30 PM

## 2024-02-24 NOTE — Plan of Care (Signed)
  Problem: Education: Goal: Knowledge of General Education information will improve Description: Including pain rating scale, medication(s)/side effects and non-pharmacologic comfort measures Outcome: Progressing   Problem: Health Behavior/Discharge Planning: Goal: Ability to manage health-related needs will improve Outcome: Progressing   Problem: Clinical Measurements: Goal: Ability to maintain clinical measurements within normal limits will improve Outcome: Progressing Goal: Will remain free from infection Outcome: Progressing Goal: Diagnostic test results will improve Outcome: Progressing Goal: Respiratory complications will improve Outcome: Progressing Goal: Cardiovascular complication will be avoided Outcome: Progressing   Problem: Activity: Goal: Risk for activity intolerance will decrease Outcome: Progressing   Problem: Nutrition: Goal: Adequate nutrition will be maintained Outcome: Progressing   Problem: Coping: Goal: Level of anxiety will decrease Outcome: Progressing   Problem: Elimination: Goal: Will not experience complications related to bowel motility Outcome: Progressing Goal: Will not experience complications related to urinary retention Outcome: Progressing   Problem: Pain Managment: Goal: General experience of comfort will improve and/or be controlled Outcome: Progressing   Problem: Safety: Goal: Ability to remain free from injury will improve Outcome: Progressing   Problem: Education: Goal: Ability to verbalize activity precautions or restrictions will improve Outcome: Progressing Goal: Knowledge of the prescribed therapeutic regimen will improve Outcome: Progressing Goal: Understanding of discharge needs will improve Outcome: Progressing   Problem: Activity: Goal: Ability to avoid complications of mobility impairment will improve Outcome: Progressing Goal: Ability to tolerate increased activity will improve Outcome: Progressing Goal:  Will remain free from falls Outcome: Progressing   Problem: Bowel/Gastric: Goal: Gastrointestinal status for postoperative course will improve Outcome: Progressing   Problem: Clinical Measurements: Goal: Ability to maintain clinical measurements within normal limits will improve Outcome: Progressing Goal: Postoperative complications will be avoided or minimized Outcome: Progressing Goal: Diagnostic test results will improve Outcome: Progressing   Problem: Pain Management: Goal: Pain level will decrease Outcome: Progressing   Problem: Skin Integrity: Goal: Will show signs of wound healing Outcome: Progressing   Problem: Bladder/Genitourinary: Goal: Urinary functional status for postoperative course will improve Outcome: Progressing

## 2024-02-24 NOTE — Progress Notes (Addendum)
   02/24/24 1000  Spiritual Encounters  Type of Visit Follow up  Care provided to: Pt and family  OnCall Visit No  Interventions  Spiritual Care Interventions Made Compassionate presence;Encouragement  Intervention Outcomes  Outcomes Connection to spiritual care   Chaplain provided compassionate presence and encouragement to patient and spouse. Spouse shared some of what is happening with pt and their life story. Patient wanted to get up but was told by spouse and nurse that was not possible at this time.

## 2024-02-25 DIAGNOSIS — I25118 Atherosclerotic heart disease of native coronary artery with other forms of angina pectoris: Secondary | ICD-10-CM

## 2024-02-25 DIAGNOSIS — D649 Anemia, unspecified: Secondary | ICD-10-CM | POA: Diagnosis not present

## 2024-02-25 DIAGNOSIS — K920 Hematemesis: Secondary | ICD-10-CM | POA: Diagnosis not present

## 2024-02-25 DIAGNOSIS — J9601 Acute respiratory failure with hypoxia: Secondary | ICD-10-CM | POA: Diagnosis not present

## 2024-02-25 DIAGNOSIS — Z951 Presence of aortocoronary bypass graft: Secondary | ICD-10-CM

## 2024-02-25 DIAGNOSIS — R7989 Other specified abnormal findings of blood chemistry: Secondary | ICD-10-CM

## 2024-02-25 DIAGNOSIS — G959 Disease of spinal cord, unspecified: Secondary | ICD-10-CM | POA: Diagnosis not present

## 2024-02-25 LAB — TROPONIN I (HIGH SENSITIVITY)
Troponin I (High Sensitivity): 112 ng/L (ref <18)
Troponin I (High Sensitivity): 116 ng/L (ref <18)
Troponin I (High Sensitivity): 122 ng/L (ref <18)
Troponin I (High Sensitivity): 131 ng/L (ref <18)

## 2024-02-25 LAB — PHOSPHORUS
Phosphorus: 2.2 mg/dL — ABNORMAL LOW (ref 2.5–4.6)
Phosphorus: 2 mg/dL — ABNORMAL LOW (ref 2.5–4.6)

## 2024-02-25 LAB — CBC
HCT: 22.1 % — ABNORMAL LOW (ref 39.0–52.0)
Hemoglobin: 7.7 g/dL — ABNORMAL LOW (ref 13.0–17.0)
MCH: 32 pg (ref 26.0–34.0)
MCHC: 34.8 g/dL (ref 30.0–36.0)
MCV: 91.7 fL (ref 80.0–100.0)
Platelets: 152 K/uL (ref 150–400)
RBC: 2.41 MIL/uL — ABNORMAL LOW (ref 4.22–5.81)
RDW: 13.5 % (ref 11.5–15.5)
WBC: 9.2 K/uL (ref 4.0–10.5)
nRBC: 0 % (ref 0.0–0.2)

## 2024-02-25 LAB — BASIC METABOLIC PANEL WITH GFR
Anion gap: 10 (ref 5–15)
Anion gap: 12 (ref 5–15)
Anion gap: 8 (ref 5–15)
BUN: 26 mg/dL — ABNORMAL HIGH (ref 8–23)
BUN: 26 mg/dL — ABNORMAL HIGH (ref 8–23)
BUN: 27 mg/dL — ABNORMAL HIGH (ref 8–23)
CO2: 20 mmol/L — ABNORMAL LOW (ref 22–32)
CO2: 20 mmol/L — ABNORMAL LOW (ref 22–32)
CO2: 22 mmol/L (ref 22–32)
Calcium: 7.4 mg/dL — ABNORMAL LOW (ref 8.9–10.3)
Calcium: 7.7 mg/dL — ABNORMAL LOW (ref 8.9–10.3)
Calcium: 7.9 mg/dL — ABNORMAL LOW (ref 8.9–10.3)
Chloride: 109 mmol/L (ref 98–111)
Chloride: 110 mmol/L (ref 98–111)
Chloride: 112 mmol/L — ABNORMAL HIGH (ref 98–111)
Creatinine, Ser: 1.47 mg/dL — ABNORMAL HIGH (ref 0.61–1.24)
Creatinine, Ser: 1.54 mg/dL — ABNORMAL HIGH (ref 0.61–1.24)
Creatinine, Ser: 1.55 mg/dL — ABNORMAL HIGH (ref 0.61–1.24)
GFR, Estimated: 48 mL/min — ABNORMAL LOW (ref >60)
GFR, Estimated: 48 mL/min — ABNORMAL LOW (ref >60)
GFR, Estimated: 51 mL/min — ABNORMAL LOW (ref >60)
Glucose, Bld: 142 mg/dL — ABNORMAL HIGH (ref 70–99)
Glucose, Bld: 160 mg/dL — ABNORMAL HIGH (ref 70–99)
Glucose, Bld: 163 mg/dL — ABNORMAL HIGH (ref 70–99)
Potassium: 3.9 mmol/L (ref 3.5–5.1)
Potassium: 4.3 mmol/L (ref 3.5–5.1)
Potassium: 4.4 mmol/L (ref 3.5–5.1)
Sodium: 140 mmol/L (ref 135–145)
Sodium: 140 mmol/L (ref 135–145)
Sodium: 143 mmol/L (ref 135–145)

## 2024-02-25 LAB — TYPE AND SCREEN
ABO/RH(D): O POS
Antibody Screen: NEGATIVE

## 2024-02-25 LAB — HEMOGLOBIN
Hemoglobin: 9.4 g/dL — ABNORMAL LOW (ref 13.0–17.0)
Hemoglobin: 8.4 g/dL — ABNORMAL LOW (ref 13.0–17.0)

## 2024-02-25 LAB — HEMOGLOBIN AND HEMATOCRIT, BLOOD
HCT: 29.4 % — ABNORMAL LOW (ref 39.0–52.0)
Hemoglobin: 9.8 g/dL — ABNORMAL LOW (ref 13.0–17.0)

## 2024-02-25 LAB — PREPARE RBC (CROSSMATCH)

## 2024-02-25 LAB — MAGNESIUM: Magnesium: 2.7 mg/dL — ABNORMAL HIGH (ref 1.7–2.4)

## 2024-02-25 LAB — LACTIC ACID, PLASMA: Lactic Acid, Venous: 1.9 mmol/L (ref 0.5–1.9)

## 2024-02-25 MED ORDER — NITROGLYCERIN 0.4 MG SL SUBL
0.4000 mg | SUBLINGUAL_TABLET | SUBLINGUAL | Status: DC | PRN
  Administered 2024-02-25 – 2024-02-26 (×3): 0.4 mg via SUBLINGUAL
  Filled 2024-02-25 (×3): qty 1

## 2024-02-25 MED ORDER — IPRATROPIUM-ALBUTEROL 0.5-2.5 (3) MG/3ML IN SOLN: 3.0000 mL | RESPIRATORY_TRACT | Status: DC | PRN

## 2024-02-25 MED ORDER — SODIUM CHLORIDE 0.9 % IV SOLN: INTRAVENOUS | Status: DC

## 2024-02-25 MED ORDER — ISOSORBIDE MONONITRATE ER 30 MG PO TB24
30.0000 mg | ORAL_TABLET | Freq: Every evening | ORAL | Status: DC
  Administered 2024-02-25 – 2024-02-27 (×3): 30 mg via ORAL
  Filled 2024-02-25 (×3): qty 1

## 2024-02-25 MED ORDER — METHYLPREDNISOLONE SODIUM SUCC 40 MG IJ SOLR
40.0000 mg | Freq: Two times a day (BID) | INTRAMUSCULAR | Status: AC
  Administered 2024-02-25 (×2): 40 mg via INTRAVENOUS
  Filled 2024-02-25 (×2): qty 1

## 2024-02-25 MED ORDER — SODIUM CHLORIDE 0.9% IV SOLUTION: Freq: Once | INTRAVENOUS | Status: AC

## 2024-02-25 MED ORDER — SODIUM BICARBONATE 8.4 % IV SOLN
100.0000 meq | Freq: Once | INTRAVENOUS | Status: AC
  Administered 2024-02-25: 100 meq via INTRAVENOUS
  Filled 2024-02-25: qty 50

## 2024-02-25 MED ORDER — SODIUM CHLORIDE 0.9% FLUSH: 10.0000 mL | INTRAVENOUS | Status: DC | PRN

## 2024-02-25 MED ORDER — NITROGLYCERIN 0.4 MG SL SUBL
SUBLINGUAL_TABLET | SUBLINGUAL | Status: AC
  Filled 2024-02-25: qty 1

## 2024-02-25 MED ORDER — POTASSIUM PHOSPHATES 15 MMOLE/5ML IV SOLN
30.0000 mmol | Freq: Once | INTRAVENOUS | Status: AC
  Administered 2024-02-25: 30 mmol via INTRAVENOUS
  Filled 2024-02-25: qty 10

## 2024-02-25 MED ORDER — MIDODRINE HCL 5 MG PO TABS
5.0000 mg | ORAL_TABLET | Freq: Three times a day (TID) | ORAL | Status: DC
  Filled 2024-02-25: qty 1

## 2024-02-25 MED ORDER — METHYLPREDNISOLONE SODIUM SUCC 40 MG IJ SOLR
40.0000 mg | INTRAMUSCULAR | Status: AC
  Administered 2024-02-26 – 2024-02-27 (×2): 40 mg via INTRAVENOUS
  Filled 2024-02-25 (×2): qty 1

## 2024-02-25 MED ORDER — NITROGLYCERIN 0.4 MG SL SUBL
0.4000 mg | SUBLINGUAL_TABLET | SUBLINGUAL | Status: AC | PRN
Start: 2024-02-25 — End: 2024-02-25
  Administered 2024-02-25 (×3): 0.4 mg via SUBLINGUAL
  Filled 2024-02-25: qty 1

## 2024-02-25 MED ORDER — MIDODRINE HCL 5 MG PO TABS
5.0000 mg | ORAL_TABLET | Freq: Three times a day (TID) | ORAL | Status: DC | PRN
Start: 2024-02-28 — End: 2024-02-25

## 2024-02-25 NOTE — Progress Notes (Signed)
 Physical Therapy Treatment Patient Details Name: Billy Cisneros. MRN: [id number] DOB: [birth year] Today's Date: 02/25/2024   History of Present Illness Billy Cisneros. Is s/p C3-7 laminoplasty. Rapid response called 10/14 and pt transferred to ICU with AMS.    PT Comments  The patient participated in a physical therapy session focusing on out-of-bed (OOB) mobility and activity tolerance. Prior to the session, the RN was notified and RN provided feedback regarding the potential need for nitroglycerin  if chest pain occurred. The patient deniedoffered prophylactic nitroglycerin . The patient was consistently asked about chest pain and denied any symptoms throughout the session. During the session, the patient required minimal assistance and one-hand held assistance to achieve a standing position at the bedside. Once standing, the patient was able to perform marching exercises, managing the activity with intermittent rest breaks as needed. All interventions were tolerated well, with the patient displaying no signs of exertion or distress, and vital signs remained stable throughout the activity. The interventions were specifically aimed at improving the patient's tolerance for OOB activity. Continued skilled physical therapy is recommended to progress the patient toward functional goals and prepare for a safe discharge. Communication with the RN was maintained both before and after the session to ensure proper symptom management and care coordination   If plan is discharge home, recommend the following: A little help with walking and/or transfers;A little help with bathing/dressing/bathroom   Can travel by private Psychologist, clinical (4 wheels)    Recommendations for Other Services       Precautions / Restrictions Precautions Precautions: Cervical Precaution Booklet Issued: Yes (comment) Recall of Precautions/Restrictions: Intact Precaution/Restrictions  Comments: may need nitro for progressive mobility Restrictions Weight Bearing Restrictions Per Provider Order: No     Mobility  Bed Mobility Overal bed mobility: Needs Assistance Bed Mobility: Sidelying to Sit, Sit to Sidelying Rolling: Contact guard assist Sidelying to sit: Contact guard assist Supine to sit: Contact guard   Sit to sidelying: Contact guard assist General bed mobility comments: bed mobility with increased time    Transfers Overall transfer level: Needs assistance Equipment used: 1 person hand held assist Transfers: Sit to/from Stand Sit to Stand: Min assist                Ambulation/Gait Ambulation/Gait assistance: Supervision, Contact guard assist Gait Distance (Feet): 10 Feet Assistive device: 1 person hand held assist Gait Pattern/deviations: Step-through pattern, Decreased stride length, Shuffle, Narrow base of support, Trunk flexed       General Gait Details: marching/walking back and forward 3 sets of 10 reps   Stairs             Wheelchair Mobility     Tilt Bed    Modified Rankin (Stroke Patients Only)       Balance Overall balance assessment: Needs assistance Sitting-balance support: Feet supported, Single extremity supported Sitting balance-Leahy Scale: Good     Standing balance support: During functional activity Standing balance-Leahy Scale: Fair Standing balance comment: mild unsteady with gait belt mild LOB                            Communication Communication Communication: No apparent difficulties  Cognition Arousal: Alert Behavior During Therapy: WFL for tasks assessed/performed   PT - Cognitive impairments: No apparent impairments  Following commands: Intact      Cueing Cueing Techniques: Verbal cues  Exercises      General Comments        Pertinent Vitals/Pain Pain Assessment Pain Assessment: Faces Faces Pain Scale: Hurts little more    Home  Living                          Prior Function            PT Goals (current goals can now be found in the care plan section) Acute Rehab PT Goals Patient Stated Goal: Pt. reports that he wants to get stronger PT Goal Formulation: With patient/family Time For Goal Achievement: 03/08/24 Potential to Achieve Goals: Good Progress towards PT goals: Progressing toward goals    Frequency    Min 3X/week      PT Plan      Co-evaluation              AM-PAC PT 6 Clicks Mobility   Outcome Measure  Help needed turning from your back to your side while in a flat bed without using bedrails?: A Little Help needed moving from lying on your back to sitting on the side of a flat bed without using bedrails?: A Little Help needed moving to and from a bed to a chair (including a wheelchair)?: A Little Help needed standing up from a chair using your arms (e.g., wheelchair or bedside chair)?: A Little Help needed to walk in hospital room?: A Lot Help needed climbing 3-5 steps with a railing? : Total 6 Click Score: 15    End of Session Equipment Utilized During Treatment: Gait belt Activity Tolerance: Patient tolerated treatment well Patient left: in bed;with bed alarm set Nurse Communication: Mobility status PT Visit Diagnosis: Unsteadiness on feet (R26.81);Muscle weakness (generalized) (M62.81)     Time: 1540-1600 PT Time Calculation (min) (ACUTE ONLY): 20 min  Charges:    $Therapeutic Activity: 8-22 mins PT General Charges $$ ACUTE PT VISIT: 1 Visit                     Sherlean Lesches DPT, PT     Sherlean A Lexianna Weinrich 02/25/2024, 4:05 PM

## 2024-02-25 NOTE — Progress Notes (Signed)
 Notified [place] NP about critical troponin levels, no new orders.

## 2024-02-25 NOTE — Plan of Care (Signed)
 Patient ambulated with assistance of RN and PT with use of front wheel walker to chair safely. Patient then reported chest pain and was given Nitroglycerin  PRN and IMDUR  scheduled. Treatment was effective and patient had no further complaint of chest pain at this time.  Problem: Education: Goal: Knowledge of General Education information will improve Description: Including pain rating scale, medication(s)/side effects and non-pharmacologic comfort measures Outcome: Progressing   Problem: Activity: Goal: Risk for activity intolerance will decrease Outcome: Progressing   Problem: Nutrition: Goal: Adequate nutrition will be maintained Outcome: Progressing   Problem: Coping: Goal: Level of anxiety will decrease Outcome: Progressing   Problem: Pain Managment: Goal: General experience of comfort will improve and/or be controlled Outcome: Progressing   Problem: Safety: Goal: Ability to remain free from injury will improve Outcome: Progressing   Problem: Skin Integrity: Goal: Risk for impaired skin integrity will decrease Outcome: Progressing   Problem: Education: Goal: Ability to verbalize activity precautions or restrictions will improve Outcome: Progressing

## 2024-02-25 NOTE — Progress Notes (Signed)
 [facility] Progress Note  Patient: Billy Cisneros.    FMW:[id number]  DOA: 02/22/2024     Date of Service: the patient was seen and examined on 02/25/2024  No chief complaint on file.  Brief hospital course: Jacey Pelc. is a 73 y.o. male with past medical history of CAD s/p CABG x 5, HTN, spinal stenosis s/p laminectomy 10/13 (prior cardiac clearance on 10/6) being seen in urgent consultation during a rapid response.  According to bedside nurse, patient had normal mentation at 4 AM however couple hours later he was difficult to arouse and had slurred speech.  Vitals revealed hypotension and hypoxia, all prompting the rapid response.  Nurse also reports that the day prior patient had an episode of coffee-ground emesis and did appear a bit confused during the afternoon hours.   Of note, patient did receive several meds over the course of the past 24 hours related to vomiting/insomnia/anxiety depression including Phenergan, Ambien , methocarbamol  and alprazolam  at 2148 on 10/14, and hydromorphone 0.5 mg at 2041.  10/15 replaced which was called today early morning and TRH was consulted for comanagement.   Assessment and Plan:  # Acute hypoxic respiratory failure secondary to aspiration pneumonia Continue supplemental nutrition and gradually wean S/p Solu-Medrol 125 mg x 1 dose, followed by 40 mg IV every 12 hourly x 2 doses and 40 mg daily for 2 doses. Started Brovana and Pulmicort nebulizer twice daily DuoNeb every 6 hourly   # Hypotension most likely secondary to sepsis IV fluid bolus given Avoid excessive fluid due to respiratory failure Continue midodrine and follow parameters Critical care consulted but patient remained stable, did not require pressure support, breathing well now.  PCCM signed off.   # Aspiration pneumonia, as patient had an episode of coffee-ground emesis most likely due to altered mental status Started [facility] consulted Check blood  cultures  # Acute metabolic encephalopathy, multifactorial CT head negative for any acute findings Avoid sedating medications Reorient, continue fall precautions Continue supportive care 10/16 resolved, currently back to baseline  # Coffee-ground emesis, possible upper GI bleed Continue PPI Hb 104> 8.3>7.7 (1u prbc)> 9.8 Monitor H&H GI consulted, Rec PPI and conservative management, not stable for scope at this time, will need EGD when stable  # Spinal stenosis, s/p laminectomy done on 10/3 Continue as needed pain meds Follow neurosurgery   # AKI and urinary retention 10/15 Foley catheter was inserted and 500 mL urine was collected Avoid nephrotic medication, use renal dose medications Check renal functions daily sCr 1.75>1.82>1.52> 1.47 CO2 20, mild acidosis, sodium bicarbonate 100 mEq IV x 1 dose given Check BMP daily   # CAD s/p CABG, HTN, HLD 10/15 hypotensive possible due to sepsis Discontinued antihypertensive medications for now (Home meds Imdur , lisinopril , Toprol -XL, Crestor ) Hold aspirin  for now due to possible GI bleeding Monitor BP and titrate medications accordingly Started midodrine, if not able to swallow then patient may need pressure support Critical care consulted. 10/16 chest pain, mildly elevated troponin most likely due to demand ischemia secondary to anemia.  Patient received 1 unit of PRBC Continue nitroglycerin  as needed Follow cardiology consult   # Hypophosphatemia, due to nutritional deficiency.  Phos repleted. # Hypomagnesemia, mag repleted. Monitor electrolytes daily and replete as needed.  # Vitamin B12 deficiency: B12 level 207, goal >400.  Started vitamin B12 1000 mcg IM injection daily during hospital stay, followed by oral supplement.  Follow-up PCP to repeat vitamin B12 level after 3 to 6 months.  #  Iron deficiency, start oral iron supplement with vitamin C when stable.  I have added IV iron due to possible active infection.  #  Constipation, started laxatives.  # Gout: Allopurinol # Depression: Continue Abilify and Wellbutrin  # Hypothyroid: Synthroid   Body mass index is 30.19 kg/m.  Interventions:    Diet: Regular diet DVT Prophylaxis: SCDs   Advance goals of care discussion: Full code  Family Communication: family was present at bedside, at the time of interview.  The pt provided permission to discuss medical plan with the family. Opportunity was given to ask question and all questions were answered satisfactorily.  10/15 discussed with patient's wife at bedside  Disposition:  Pt is from Home, admitted with laminectomy, developed aspiration pneumonia, hematemesis, hypotension, multiple other conditions as dictated above, still very sick and not stable, which precludes a safe discharge. Discharge to home with home health versus SNF, TBD, when stable, may need few days to improve. TRH is a Psychologist, counselling. Primary team is neurosurgery  Subjective: Last night patient developed chest pain and noticed mildly elevated troponin.  Possible secondary to demand ischemia due to anemia.  Patient was given 1 unit of PRBC and nitroglycerin , chest pain resolved but again in the morning time he was complaining of chest pain which resolved after nitroglycerin . No active bleeding. Cardiology was consulted.   Physical Exam: General: Respiratory distress, pleasantly disoriented Eyes: PERRLA ENT: Oral Mucosa Clear, moist  Neck: no JVD,  Cardiovascular: S1 and S2 Present, no Murmur,  Respiratory: Creased respiratory rate, short of breath, bilateral crackles, mild wheezes Abdomen: BS present, Soft and no tenderness,  Skin: no rashes Extremities: no Pedal edema, no calf tenderness Neurologic: without any new focal findings Gait not checked due to patient safety concerns  Vitals:   02/25/24 0700 02/25/24 0800 02/25/24 0809 02/25/24 1437  BP: (!) 95/55 103/65 101/60 (!) 144/80  Pulse: 93 88 89   Resp: (!) 21 (!) 22  (!) 22   Temp:   97.8 F (36.6 C) 97.8 F (36.6 C)  TempSrc:   Axillary Oral  SpO2: 95% 95% 94%   Weight:      Height:        Intake/Output Summary (Last 24 hours) at 02/25/2024 1639 Last data filed at 02/25/2024 1210 Gross per 24 hour  Intake 3275.87 ml  Output 2445 ml  Net 830.87 ml   Filed Weights   02/24/24 0721  Weight: 82.3 kg    Data Reviewed: I have personally reviewed and interpreted daily labs, tele strips, imagings as discussed above. I reviewed all nursing notes, pharmacy notes, vitals, pertinent old records I have discussed plan of care as described above with RN and patient/family.  CBC: Recent Labs  Lab 02/23/24 1847 02/24/24 0612 02/24/24 1155 02/24/24 1807 02/24/24 2341 02/25/24 0206 02/25/24 1026 02/25/24 1404  WBC 14.5* 10.2  --   --   --  9.2  --   --   HGB 10.4* 9.7*   < > 8.6* 8.0* 7.7* 9.8* 9.4*  HCT 29.9* 29.1*  --   --   --  22.1* 29.4*  --   MCV 90.9 92.4  --   --   --  91.7  --   --   PLT 267 214  --   --   --  152  --   --    < > = values in this interval not displayed.   Basic Metabolic Panel: Recent Labs  Lab 02/23/24 1847 02/24/24 0612 02/24/24 2147  02/24/24 2341 02/25/24 0206 02/25/24 1404  NA 136 136  --  140 140 143  K 4.7 4.6  --  4.4 4.3 3.9  CL 99 104  --  110 112* 109  CO2 25 21*  --  20* 20* 22  GLUCOSE 163* 121*  --  163* 160* 142*  BUN 42* 36*  --  27* 26* 26*  CREATININE 1.82* 1.52*  --  1.55* 1.47* 1.54*  CALCIUM  8.5* 8.0*  --  7.7* 7.4* 7.9*  MG  --  1.6* 2.7*  --  2.7*  --   PHOS  --  1.9* 2.5  --  2.2* 2.0*    Studies: US  Venous Img Lower Bilateral (DVT) Result Date: 02/24/2024 CLINICAL DATA:  9965 Edema 9965 EXAM: BILATERAL LOWER EXTREMITY VENOUS DOPPLER ULTRASOUND TECHNIQUE: Gray-scale sonography with graded compression, as well as color Doppler and duplex ultrasound were performed to evaluate the lower extremity deep venous systems from the level of the common femoral vein and including the common  femoral, femoral, profunda femoral, popliteal and calf veins including the posterior tibial, peroneal and gastrocnemius veins when visible. The superficial great saphenous vein was also interrogated. Spectral Doppler was utilized to evaluate flow at rest and with distal augmentation maneuvers in the common femoral, femoral and popliteal veins. COMPARISON:  None Available. FINDINGS: RIGHT LOWER EXTREMITY Common Femoral Vein: No evidence of thrombus. Normal compressibility, respiratory phasicity and response to augmentation. Saphenofemoral Junction: No evidence of thrombus. Normal compressibility and flow on color Doppler imaging. Profunda Femoral Vein: No evidence of thrombus. Normal compressibility and flow on color Doppler imaging. Femoral Vein: No evidence of thrombus. Normal compressibility, respiratory phasicity and response to augmentation. Popliteal Vein: No evidence of thrombus. Normal compressibility, respiratory phasicity and response to augmentation. Calf Veins: No evidence of thrombus. Normal compressibility and flow on color Doppler imaging. Superficial Great Saphenous Vein: No evidence of thrombus. Normal compressibility. Other Findings:  None. LEFT LOWER EXTREMITY Common Femoral Vein: No evidence of thrombus. Normal compressibility, respiratory phasicity and response to augmentation. Saphenofemoral Junction: No evidence of thrombus. Normal compressibility and flow on color Doppler imaging. Profunda Femoral Vein: No evidence of thrombus. Normal compressibility and flow on color Doppler imaging. Femoral Vein: No evidence of thrombus. Normal compressibility, respiratory phasicity and response to augmentation. Popliteal Vein: No evidence of thrombus. Normal compressibility, respiratory phasicity and response to augmentation. Calf Veins: No evidence of thrombus. Normal compressibility and flow on color Doppler imaging. Superficial Great Saphenous Vein: No evidence of thrombus. Normal compressibility. Other  Findings:  Peripheral vascular atherosclerosis. IMPRESSION: Negative for deep venous thrombosis within both legs. Electronically Signed   By: Rogelia Myers M.D.   On: 02/24/2024 17:14    Scheduled Meds:  acetaminophen   1,000 mg Oral Q6H   allopurinol  100 mg Oral Daily   arformoterol  15 mcg Nebulization BID   And   budesonide (PULMICORT) nebulizer solution  0.25 mg Nebulization BID   ascorbic acid  500 mg Oral Daily   bisacodyl  10 mg Oral QHS   buPROPion   300 mg Oral Daily   calcium  carbonate  1 tablet Oral TID WC   Chlorhexidine Gluconate Cloth  6 each Topical Daily   cyanocobalamin  1,000 mcg Intramuscular Daily   Followed by   NOREEN ON 03/02/2024] vitamin B-12  1,000 mcg Oral Daily   docusate sodium  100 mg Oral BID   iron polysaccharides  150 mg Oral Daily   isosorbide  mononitrate  30 mg Oral QPM  levothyroxine  25 mcg Oral Q0600   methylPREDNISolone (SOLU-MEDROL) injection  40 mg Intravenous Q12H   Followed by   NOREEN ON 02/26/2024] methylPREDNISolone (SOLU-MEDROL) injection  40 mg Intravenous Q24H   midodrine  5 mg Oral TID WC   pantoprazole  (PROTONIX ) IV  40 mg Intravenous Q8H   Followed by   NOREEN ON 02/27/2024] pantoprazole  (PROTONIX ) IV  40 mg Intravenous Q12H   polyethylene glycol  17 g Oral BID   rosuvastatin   20 mg Oral QHS   sodium chloride  flush  3 mL Intravenous Q12H   Continuous Infusions:  sodium chloride  75 mL/hr at 02/25/24 1210   piperacillin-tazobactam (ZOSYN)  IV 3.375 g (02/25/24 1158)   promethazine (PHENERGAN) injection (IM or IVPB) Stopped (02/23/24 2208)   PRN Meds: ALPRAZolam , bisacodyl, colchicine , iohexol , ipratropium-albuterol, magnesium  citrate, menthol **OR** phenol, midodrine **FOLLOWED BY** [START ON 02/28/2024] midodrine, naLOXone (NARCAN)  injection, nitroGLYCERIN , ondansetron  **OR** ondansetron  (ZOFRAN ) IV, promethazine (PHENERGAN) injection (IM or IVPB), simethicone , sodium chloride  flush, sodium chloride  flush, sorbitol  Time  spent: 55 minutes  Author: ELVAN SOR. MD [facility] 02/25/2024 4:39 PM  To reach On-call, see care teams to locate the attending and reach out to them via [email or web address]. If 7PM-7AM, please contact night-coverage If you still have difficulty reaching the attending provider, please page the [facility] (Director on Call) for [facility] on amion for assistance.

## 2024-02-25 NOTE — Progress Notes (Signed)
   02/25/24 0915  Spiritual Encounters  Type of Visit Follow up  Care provided to: Patient  Referral source Chaplain assessment  Reason for visit Routine spiritual support  OnCall Visit No  Interventions  Spiritual Care Interventions Made Established relationship of care and support;Compassionate presence;Reflective listening  Intervention Outcomes  Outcomes Connection to spiritual care  Spiritual Care Plan  Spiritual Care Issues Still Outstanding No further spiritual care needs at this time (see row info)   Chaplain had been to pt's room Wednesday morning for rapid response and stopped in to see pt sitting up in chair and talking with nurses. Told him it was nice to see him up and he stated, It's nice to be up.

## 2024-02-25 NOTE — Progress Notes (Signed)
 Notified [place] NP of current labs and recurrent chest pain. Received orders for 1 unit of packed red blood cells and nitroglycerin . VSS.

## 2024-02-25 NOTE — Consult Note (Signed)
 Cardiology Consultation   Patient ID: Billy Cisneros. MRN: [id number]; DOB: 1951/02/22  Admit date: 02/22/2024 Date of Consult: 02/25/2024  PCP:  Billy Ophelia JINNY DOUGLAS, MD   [facility] Providers Cardiologist:  Billy JAYSON Maxcy, MD  Physician requesting consult: Dr. Von Reason for consult: Angina, elevated troponin  Patient Profile: Billy Cisneros. is a 73 y.o. male with a hx of coronary artery disease, history of CABG, chronic stable angina, status post cervical laminoplasty C3-7, with post surgical anemia, hypertensive, tachycardic, hypoxia early a.Cisneros. February 23, 2024 after polypharmacy, stuttering chest pain since that time, mildly elevated troponin around 100 nontrending  History of Present Illness: Billy Cisneros underwent cervical surgery for stenosis, myelopathy/posterior cervical laminoplasty C3-7 on February 22, 2024.  Initially did well postoperatively apart from blood loss anemia and urine retention Overnight February 23, 2024 receiving Robaxin , Ambien , Dilaudid, Abilify, rapid response was called for decreased mentation, hypotension, tachycardia with hypoxia, transferred to the ICU.  Also reports of hematemesis On rounds reports having difficulty swallowing, able to get down ice cream Was having some chest discomfort He has required nitro sublingual periodically for symptoms Of note he has not been restarted on his isosorbide  mononitrate.  In the past off isosorbide  has had worsening angina symptoms.   Was started on midodrine, blood pressure 120 systolic Received blood transfusion today for hemoglobin 7.7 down from 13.7 Not on anticoagulation  Past Medical History:  Diagnosis Date   Anxiety    a.) on BZO (alprazolam ) PRN   Cervical myelopathy (HCC)    CKD (chronic kidney disease) stage 3, GFR 30-59 ml/min (HCC)    Coronary artery disease 03/15/1999   a.) LHC 03/15/1999: multi-vessel CAD --> CVTS; b.) s/p 5v CABG 03/18/1999; c.) LHC 03/29/2003,  03/12/2006, 01/12/2007, 05/02/2010 - MV Dz with patent bypass grafts x 5; d.) LHC 10/14/2019: MV Dz with CTO OM1; e.) LHC 11/11/2021: MV Dz with CTO SVG-OM1 and SVG-D1; f.) NSTEMI 10/09/2022 --> LHC 10/10/2022: MV Dz with CTO OM1 and SVG D1 - med mgmt   DDD (degenerative disc disease), thoracolumbar    Depression    Diverticulosis    Duodenal diverticulum    GERD (gastroesophageal reflux disease)    Gout    Hepatic steatosis    Hiatal hernia    History of cocaine abuse (HCC)    last used in 2015 per pt (as of 02-15-24)   Hyperlipidemia    Hypertension    Hypothyroidism    Insomnia    a.) on hypnotic (zolpidem ) PRN   Long-term use of aspirin  therapy    Nausea and vomiting 02/24/2024   NSTEMI (non-ST elevated myocardial infarction) (HCC) 10/09/2022   S/P CABG x 5 03/18/1999   a.) LIMA-LAD, RIMA-RCA, SVG-OM1, SVG-D1, SVG-RI   Spinal stenosis of cervical region with radiculopathy    Umbilical hernia    Unstable angina (HCC) 11/11/2021    Past Surgical History:  Procedure Laterality Date   CERVICAL LAMINOPLASTY N/A 02/22/2024   Procedure: CERVICAL LAMINOPLASTY;  Surgeon: Billy Fret, MD;  Location: [facility] ORS;  Service: Neurosurgery;  Laterality: N/A;  C3-7 LAMINOPLASTY   COLONOSCOPY WITH PROPOFOL  N/A 05/25/2017   Procedure: COLONOSCOPY WITH PROPOFOL ;  Surgeon: Billy Bi, MD;  Location: [facility] ENDOSCOPY;  Service: Gastroenterology;  Laterality: N/A;   CORONARY ARTERY BYPASS GRAFT N/A 03/18/1999   Procedure: CORONARY ARTERY BYPASS GRAFT; Location: [place]   ESOPHAGOGASTRODUODENOSCOPY N/A 10/27/2017   Procedure: ESOPHAGOGASTRODUODENOSCOPY (EGD);  Surgeon: Billy Corinn Skiff, MD;  Location: [facility] ENDOSCOPY;  Service:  Gastroenterology;  Laterality: N/A;   ESOPHAGOGASTRODUODENOSCOPY (EGD) WITH PROPOFOL  N/A 11/19/2017   Procedure: ESOPHAGOGASTRODUODENOSCOPY (EGD) WITH PROPOFOL ;  Surgeon: Billy Corinn Skiff, MD;  Location: [facility] ENDOSCOPY;  Service: Gastroenterology;  Laterality: N/A;    LEFT HEART CATH AND CORONARY ANGIOGRAPHY Left 03/15/1999   Procedure: LEFT HEART CATH AND CORONARY ANGIOGRAPHY; Location: [facility] [place]   LEFT HEART CATH AND CORS/GRAFTS ANGIOGRAPHY N/A 10/14/2019   Procedure: LEFT HEART CATH AND CORS/GRAFTS ANGIOGRAPHY;  Surgeon: Billy Victory ORN, MD;  Location: [facility] INVASIVE CV LAB;  Service: Cardiovascular;  Laterality: N/A;   LEFT HEART CATH AND CORS/GRAFTS ANGIOGRAPHY N/A 11/11/2021   Procedure: LEFT HEART CATH AND CORS/GRAFTS ANGIOGRAPHY;  Surgeon: Billy Bruckner, MD;  Location: [facility] INVASIVE CV LAB;  Service: Cardiovascular;  Laterality: N/A;   LEFT HEART CATH AND CORS/GRAFTS ANGIOGRAPHY N/A 10/10/2022   Procedure: LEFT HEART CATH AND CORS/GRAFTS ANGIOGRAPHY;  Surgeon: [place], Peter M, MD;  Location: [facility] INVASIVE CV LAB;  Service: Cardiovascular;  Laterality: N/A;   LEFT HEART CATH AND CORS/GRAFTS ANGIOGRAPHY Left 03/29/2003   Procedure: LEFT HEART CATH AND CORS/GRAFTS ANGIOGRAPHY; Location: [facility]; Surgeon: Billy Jude, MD   LEFT HEART CATH AND CORS/GRAFTS ANGIOGRAPHY Left 03/12/2006   Procedure: LEFT HEART CATH AND CORS/GRAFTS ANGIOGRAPHY; Location: [facility]; Surgeon: Billy Rhyme, MD   LEFT HEART CATH AND CORS/GRAFTS ANGIOGRAPHY Left 01/12/2007   Procedure: LEFT HEART CATH AND CORS/GRAFTS ANGIOGRAPHY; Location: [facility]; Surgeon: Billy Rhyme, MD   LEFT HEART CATH AND CORS/GRAFTS ANGIOGRAPHY Left 05/02/2010   Procedure: LEFT HEART CATH AND CORS/GRAFTS ANGIOGRAPHY; Location: [facility]; Surgeon: Billy Dooms, MD     Home Medications:  Prior to Admission medications   Medication Sig Start Date End Date Taking? Authorizing Provider  allopurinol (ZYLOPRIM) 100 MG tablet Take 100 mg by mouth daily. 08/25/21  Yes [provider]  ALPRAZolam  (XANAX ) 0.5 MG tablet TAKE 1 TABLET BY MOUTH 3 TIMES A DAY AS NEEDED FOR ANXIETY. 06/24/17  Yes Billy Leni Edyth DELENA, MD  ARIPiprazole (ABILIFY) 2 MG tablet Take 2 mg by mouth at bedtime. 12/25/23  Yes [provider]  aspirin  EC 81 MG tablet Take 1 tablet (81 mg total) by mouth daily. For heart attack prevention. 11/22/11  Yes Billy Cisneros, Aureliano DASEN, PA-C  buPROPion  (WELLBUTRIN  XL) 300 MG 24 hr tablet Take 1 tablet (300 mg total) by mouth daily. 06/24/17  Yes Billy Leni Edyth DELENA, MD  colchicine  0.6 MG tablet Take 0.6 mg by mouth daily as needed (gout). 08/24/21  Yes [provider]  isosorbide  mononitrate (IMDUR ) 60 MG 24 hr tablet Take 1 tablet (60 mg total) by mouth daily. Patient taking differently: Take 60 mg by mouth at bedtime. 12/24/23  Yes Hilty, Billy BROCKS, MD  levothyroxine (SYNTHROID) 25 MCG tablet Take 25 mcg by mouth daily before breakfast. 12/17/23 12/16/24 Yes [provider]  lisinopril  (PRINIVIL ,ZESTRIL ) 10 MG tablet Take 1 tablet (10 mg total) by mouth daily. Patient taking differently: Take 10 mg by mouth every morning. 06/24/17  Yes Billy Leni Edyth DELENA, MD  metoprolol  succinate (TOPROL  XL) 25 MG 24 hr tablet Take 1 tablet (25 mg total) by mouth daily. Patient taking differently: Take 25 mg by mouth every morning. 12/31/23  Yes Cleaver, Josefa HERO, NP  Multiple Vitamins-Minerals (MULTIVITAMIN WITH MINERALS) tablet Take 1 tablet by mouth at bedtime.   Yes [provider]  omega-3 acid ethyl esters (LOVAZA ) 1 g capsule TAKE ONE CAPSULE BY MOUTH TWICE A DAY 12/24/23  Yes Hilty, Billy BROCKS, MD  omeprazole  (PRILOSEC) 20 MG  capsule Take 1 capsule (20 mg total) by mouth 2 (two) times daily before a meal. 06/24/17  Yes Billy Leni Edyth DELENA, MD  predniSONE  (DELTASONE ) 10 MG tablet Take 10 mg by mouth daily as needed (gout flare). 07/04/21  Yes [provider]  rosuvastatin  (CRESTOR ) 20 MG tablet Take 1 tablet (20 mg total) by mouth at bedtime. 10/03/22  Yes Hilty, Billy BROCKS, MD  valACYclovir  (VALTREX ) 500 MG tablet Take 500 mg by mouth 2 (two) times daily as needed (outbreak).    Yes [provider]  zolpidem  (AMBIEN ) 5 MG tablet Take 5 mg by mouth at bedtime as needed. 01/25/24  Yes  [provider]  nitroGLYCERIN  (NITROSTAT ) 0.4 MG SL tablet Place 1 tablet (0.4 mg total) under the tongue every 5 (five) minutes as needed for chest pain. 12/31/23   Emelia Josefa HERO, NP    Scheduled Meds:  acetaminophen   1,000 mg Oral Q6H   allopurinol  100 mg Oral Daily   arformoterol  15 mcg Nebulization BID   And   budesonide (PULMICORT) nebulizer solution  0.25 mg Nebulization BID   ascorbic acid  500 mg Oral Daily   bisacodyl  10 mg Oral QHS   buPROPion   300 mg Oral Daily   calcium  carbonate  1 tablet Oral TID WC   Chlorhexidine Gluconate Cloth  6 each Topical Daily   cyanocobalamin  1,000 mcg Intramuscular Daily   Followed by   NOREEN ON 03/02/2024] vitamin B-12  1,000 mcg Oral Daily   docusate sodium  100 mg Oral BID   iron polysaccharides  150 mg Oral Daily   isosorbide  mononitrate  30 mg Oral QPM   levothyroxine  25 mcg Oral Q0600   methylPREDNISolone (SOLU-MEDROL) injection  40 mg Intravenous Q12H   Followed by   NOREEN ON 02/26/2024] methylPREDNISolone (SOLU-MEDROL) injection  40 mg Intravenous Q24H   midodrine  5 mg Oral TID WC   nitroGLYCERIN        pantoprazole  (PROTONIX ) IV  40 mg Intravenous Q8H   Followed by   NOREEN ON 02/27/2024] pantoprazole  (PROTONIX ) IV  40 mg Intravenous Q12H   polyethylene glycol  17 g Oral BID   rosuvastatin   20 mg Oral QHS   sodium chloride  flush  3 mL Intravenous Q12H   Continuous Infusions:  sodium chloride  75 mL/hr at 02/25/24 1210   piperacillin-tazobactam (ZOSYN)  IV 3.375 g (02/25/24 1158)   promethazine (PHENERGAN) injection (IM or IVPB) Stopped (02/23/24 2208)   PRN Meds: ALPRAZolam , bisacodyl, colchicine , iohexol , ipratropium-albuterol, magnesium  citrate, menthol **OR** phenol, midodrine **FOLLOWED BY** [START ON 02/28/2024] midodrine, naLOXone (NARCAN)  injection, nitroGLYCERIN , nitroGLYCERIN , ondansetron  **OR** ondansetron  (ZOFRAN ) IV, promethazine (PHENERGAN) injection (IM or IVPB), simethicone , sodium chloride   flush, sodium chloride  flush, sorbitol  Allergies:   No Known Allergies  Social History:   Social History   Socioeconomic History   Marital status: Married    Spouse name: Not on file   Number of children: Not on file   Years of education: Not on file   Highest education level: Not on file  Occupational History   Not on file  Tobacco Use   Smoking status: Never   Smokeless tobacco: Never  Vaping Use   Vaping status: Never Used  Substance and Sexual Activity   Alcohol  use: Yes    Comment: rare   Drug use: Not Currently    Frequency: 2.0 times per week    Types: Cocaine    Comment: last used in 2015  Sexual activity: Yes    Birth control/protection: None  Other Topics Concern   Not on file  Social History Narrative   Not on file   Social Drivers of Health   Financial Resource Strain: Low Risk  (08/11/2023)   Received from [facility]   Overall Financial Resource Strain (CARDIA)    Difficulty of Paying Living Expenses: Not hard at all  Food Insecurity: No Food Insecurity (02/22/2024)   Hunger Vital Sign    Worried About Running Out of Food in the Last Year: Never true    Ran Out of Food in the Last Year: Never true  Transportation Needs: No Transportation Needs (02/22/2024)   PRAPARE - Administrator, Civil Service (Medical): No    Lack of Transportation (Non-Medical): No  Physical Activity: Not on file  Stress: Not on file  Social Connections: Patient Declined (02/22/2024)   Social Connection and Isolation Panel    Frequency of Communication with Friends and Family: Patient declined    Frequency of Social Gatherings with Friends and Family: Patient declined    Attends Religious Services: Patient declined    Database administrator or Organizations: Patient declined    Attends Banker Meetings: Patient declined    Marital Status: Patient declined  Intimate Partner Violence: Not At Risk (02/22/2024)   Humiliation, Afraid,  Rape, and Kick questionnaire    Fear of Current or Ex-Partner: No    Emotionally Abused: No    Physically Abused: No    Sexually Abused: No    Family History:    Family History  Problem Relation Age of Onset   Heart attack Mother    Hyperlipidemia Mother    Hypertension Mother    Lung cancer Father    Diabetes Sister    Diabetes Brother    Asthma Daughter    Cancer Maternal Grandmother    Stroke Maternal Grandfather    Heart attack Paternal Grandfather      ROS:  Please see the history of present illness.  Review of Systems  Constitutional: Negative.   HENT: Negative.    Respiratory: Negative.    Cardiovascular:  Positive for chest pain.  Gastrointestinal: Negative.   Musculoskeletal: Negative.   Neurological: Negative.   Psychiatric/Behavioral: Negative.    All other systems reviewed and are negative.   Physical Exam/Data: Vitals:   02/25/24 0700 02/25/24 0800 02/25/24 0809 02/25/24 1437  BP: (!) 95/55 103/65 101/60 (!) 144/80  Pulse: 93 88 89   Resp: (!) 21 (!) 22 (!) 22   Temp:   97.8 F (36.6 C) 97.8 F (36.6 C)  TempSrc:   Axillary Oral  SpO2: 95% 95% 94%   Weight:      Height:        Intake/Output Summary (Last 24 hours) at 02/25/2024 1457 Last data filed at 02/25/2024 1210 Gross per 24 hour  Intake 3569.81 ml  Output 2870 ml  Net 699.81 ml      02/24/2024    7:21 AM 02/17/2024    8:36 AM 02/15/2024    1:35 PM  Last 3 Weights  Weight (lbs) 181 lb 7 oz 183 lb 4.8 oz 179 lb 12.8 oz  Weight (kg) 82.3 kg 83.144 kg 81.557 kg     Body mass index is 30.19 kg/Cisneros.  General:  Well nourished, well developed, in no acute distress HEENT: normal Neck: no JVD Vascular: No carotid bruits; Distal pulses 2+ bilaterally Cardiac:  normal S1, S2; RRR; no  murmur  Lungs:  clear to auscultation bilaterally, no wheezing, rhonchi or rales  Abd: soft, nontender, no hepatomegaly  Ext: no edema Musculoskeletal:  No deformities, BUE and BLE strength normal and  equal Skin: warm and dry  Neuro:  CNs 2-12 intact, no focal abnormalities noted Psych:  Normal affect   EKG:  The EKG was personally reviewed and demonstrates:   Sinus tachycardia rate 120 bpm nonspecific ST abnormality V3, V4  Telemetry:  Telemetry was personally reviewed and demonstrates:     Relevant CV Studies: Echo December 31, 2023 EF 17-Mar-2054 to 60% Nuclear medicine PET cardiac perfusion study with partially reversible defect mid to distal anterior, anterolateral wall  Laboratory Data: High Sensitivity Troponin:   Recent Labs  Lab 02/24/24 17-Mar-2146 02/24/24 2341 02/25/24 0206 02/25/24 0646 02/25/24 1026  TROPONINIHS 97* 112* 131* 122* 116*     Chemistry Recent Labs  Lab 02/24/24 0612 02/24/24 03-17-2146 02/24/24 2341 02/25/24 0206  NA 136  --  140 140  K 4.6  --  4.4 4.3  CL 104  --  110 112*  CO2 21*  --  20* 20*  GLUCOSE 121*  --  163* 160*  BUN 36*  --  27* 26*  CREATININE 1.52*  --  1.55* 1.47*  CALCIUM  8.0*  --  7.7* 7.4*  MG 1.6* 2.7*  --  2.7*  GFRNONAA 49*  --  48* 51*  ANIONGAP 11  --  10 8    Recent Labs  Lab 02/23/24 1847 02/24/24 0612  PROT 6.0* 5.6*  ALBUMIN 3.4* 3.3*  AST 40 34  ALT 33 26  ALKPHOS 48 38  BILITOT 0.8 0.7   Lipids No results for input(s): CHOL, TRIG, HDL, LABVLDL, LDLCALC, CHOLHDL in the last 168 hours.  Hematology Recent Labs  Lab 02/23/24 1847 02/24/24 0612 02/24/24 1155 02/25/24 0206 02/25/24 1026 02/25/24 1404  WBC 14.5* 10.2  --  9.2  --   --   RBC 3.29* 3.15*  --  2.41*  --   --   HGB 10.4* 9.7*   < > 7.7* 9.8* 9.4*  HCT 29.9* 29.1*  --  22.1* 29.4*  --   MCV 90.9 92.4  --  91.7  --   --   MCH 31.6 30.8  --  32.0  --   --   MCHC 34.8 33.3  --  34.8  --   --   RDW 13.1 13.4  --  13.5  --   --   PLT 267 214  --  152  --   --    < > = values in this interval not displayed.   Thyroid  No results for input(s): TSH, FREET4 in the last 168 hours.  BNP Recent Labs  Lab 02/24/24 0612  BNP 24.5    DDimer  No results for input(s): DDIMER in the last 168 hours.  Radiology/Studies:  US  Venous Img Lower Bilateral (DVT) Result Date: 02/24/2024 CLINICAL DATA:  9965 Edema 9965 EXAM: BILATERAL LOWER EXTREMITY VENOUS DOPPLER ULTRASOUND TECHNIQUE: Gray-scale sonography with graded compression, as well as color Doppler and duplex ultrasound were performed to evaluate the lower extremity deep venous systems from the level of the common femoral vein and including the common femoral, femoral, profunda femoral, popliteal and calf veins including the posterior tibial, peroneal and gastrocnemius veins when visible. The superficial great saphenous vein was also interrogated. Spectral Doppler was utilized to evaluate flow at rest and with distal augmentation maneuvers in the common femoral, femoral and  popliteal veins. COMPARISON:  None Available. FINDINGS: RIGHT LOWER EXTREMITY Common Femoral Vein: No evidence of thrombus. Normal compressibility, respiratory phasicity and response to augmentation. Saphenofemoral Junction: No evidence of thrombus. Normal compressibility and flow on color Doppler imaging. Profunda Femoral Vein: No evidence of thrombus. Normal compressibility and flow on color Doppler imaging. Femoral Vein: No evidence of thrombus. Normal compressibility, respiratory phasicity and response to augmentation. Popliteal Vein: No evidence of thrombus. Normal compressibility, respiratory phasicity and response to augmentation. Calf Veins: No evidence of thrombus. Normal compressibility and flow on color Doppler imaging. Superficial Great Saphenous Vein: No evidence of thrombus. Normal compressibility. Other Findings:  None. LEFT LOWER EXTREMITY Common Femoral Vein: No evidence of thrombus. Normal compressibility, respiratory phasicity and response to augmentation. Saphenofemoral Junction: No evidence of thrombus. Normal compressibility and flow on color Doppler imaging. Profunda Femoral Vein: No evidence of thrombus.  Normal compressibility and flow on color Doppler imaging. Femoral Vein: No evidence of thrombus. Normal compressibility, respiratory phasicity and response to augmentation. Popliteal Vein: No evidence of thrombus. Normal compressibility, respiratory phasicity and response to augmentation. Calf Veins: No evidence of thrombus. Normal compressibility and flow on color Doppler imaging. Superficial Great Saphenous Vein: No evidence of thrombus. Normal compressibility. Other Findings:  Peripheral vascular atherosclerosis. IMPRESSION: Negative for deep venous thrombosis within both legs. Electronically Signed   By: Rogelia Myers Cisneros.D.   On: 02/24/2024 17:14   DG Chest Port 1 View Result Date: 02/24/2024 CLINICAL DATA:  [id number] Pulmonary edema [id number] EXAM: PORTABLE CHEST - 1 VIEW COMPARISON:  02/24/2024 5:50 a.Cisneros. FINDINGS: Low lung volumes. Similar streaky atelectasis in the perihilar regions bilaterally. No new airspace consolidation, pleural effusion, or pneumothorax. Mild cardiomegaly. Sternotomy wires and CABG markers. Aortic atherosclerosis. No acute fracture or destructive lesions. Multilevel thoracic osteophytosis. IMPRESSION: Low lung volumes.  Otherwise, no acute cardiopulmonary abnormality. Electronically Signed   By: Rogelia Myers Cisneros.D.   On: 02/24/2024 09:23   CT HEAD WO CONTRAST ( ) Result Date: 02/24/2024 EXAM: CT HEAD WITHOUT CONTRAST 02/24/2024 07:11:12 AM TECHNIQUE: CT of the head was performed without the administration of intravenous contrast. Automated exposure control, iterative reconstruction, and/or weight based adjustment of the mA/kV was utilized to reduce the radiation dose to as low as reasonably achievable. COMPARISON: Head CT dated 11/23/2017. CLINICAL HISTORY: Mental status change, unknown cause. FINDINGS: BRAIN AND VENTRICLES: Mild to moderate cerebral atrophy, small vessel disease, and atrophic ventriculomegaly are again noted. There is relatively minimal cerebellar volume loss. No  acute hemorrhage. No cortical based acute infarct. No mass effect or midline shift. No extra-axial collection. The basal cisterns are clear. There are calcifications in the distal right vertebral artery, the basilar artery, and both siphons. No hyperdense vessels. ORBITS: No acute abnormality. SINUSES: Paranasal sinuses and left mastoid air cells are clear. Trace chronic fluid again noted at the right mastoid tip. SOFT TISSUES AND SKULL: There are dorsal midline skin staples partially visible in the upper neck as well as a small amount of subcutaneous soft tissue gas, presumably left over from cervical laminoplasty done 2 days ago. No focal skull lesions or skull fractures are seen. IMPRESSION: 1. No acute intracranial CT findings. Stable exam. Atrophy and small vessel changes. 2. Postoperative changes in the upper neck with dorsal midline skin staples and small volume subcutaneous gas related to recent cervical laminoplasty. Electronically signed by: Francis Quam MD 02/24/2024 07:39 AM EDT RP Workstation: [id number]   CT ABDOMEN PELVIS WO CONTRAST Result Date: 02/24/2024 EXAM: CT ABDOMEN AND PELVIS WITHOUT CONTRAST 02/24/2024  07:11:12 AM TECHNIQUE: CT of the abdomen and pelvis was performed without the administration of intravenous contrast. Multiplanar reformatted images are provided for review. Automated exposure control, iterative reconstruction, and/or weight-based adjustment of the mA/kV was utilized to reduce the radiation dose to as low as reasonably achievable. COMPARISON: CT abdomen pelvis with iv contrast 02/16/2024. AP and lateral chest from 02/24/2024. CLINICAL HISTORY: Abdominal pain, acute, nonlocalized. FINDINGS: LOWER CHEST: Increased posterior atelectasis in both lower lobes. Increased bilateral lower lobe bronchial thickening and patchy hazy consolidation worrisome for bilateral pneumonia or aspiration. Subpleural linear scarring or atelectasis again noted in the lateral base of the right  middle lobe with remaining lungs clear. CABG changes with native 3 vessel coronary calcifications and normal cardiac size. Moderate sized hiatal hernia. LIVER: The liver is mildly steatotic and otherwise unremarkable. GALLBLADDER AND BILE DUCTS: Gallbladder is unremarkable. No biliary ductal dilatation. SPLEEN: No acute abnormality. PANCREAS: No acute abnormality. ADRENAL GLANDS: No acute abnormality. KIDNEYS, URETERS AND BLADDER: No stones in the kidneys or ureters. No hydronephrosis. No perinephric or periureteral stranding. Urinary bladder is unremarkable. There is no contour deforming renal mass. GI AND BOWEL: There are retained food products in the proximal stomach and in the hiatal hernia. Normal caliber of the small bowel and appendix. Mild to moderate fecal stasis in the cecum and ascending colon. There are scattered descending and sigmoid diverticula but no evidence of diverticulitis. There is no bowel obstruction. PERITONEUM AND RETROPERITONEUM: No ascites. No free air. VASCULATURE: Aorta is normal in caliber. Moderate aortic atherosclerosis without aneurysm. LYMPH NODES: No lymphadenopathy. REPRODUCTIVE ORGANS: No acute abnormality. BONES AND SOFT TISSUES: Degenerative changes thoracic and lumbar spine. No focal soft tissue abnormality. IMPRESSION: 1. Increased bilateral lower lobe bronchial thickening and patchy hazy consolidation, worrisome for bilateral pneumonia or aspiration. 2. Moderate sized hiatal hernia with retained food products in the proximal stomach and in the hernia. 3. Constipation and diverticulosis. 4. Aortic atherosclerosis. Electronically signed by: Francis Quam MD 02/24/2024 07:30 AM EDT RP Workstation: [id number]   DG Chest 2 View Result Date: 02/24/2024 EXAM: 2 VIEW(S) XRAY OF THE CHEST 02/24/2024 05:53:50 AM COMPARISON: Portable chest 10/09/2022. CLINICAL HISTORY: Chest pain [phone]. Chest pain/ pt had difficulty following instructions to remain still during exam. FINDINGS:  LUNGS AND PLEURA: The lungs are expiratory. There is perihilar linear atelectasis. No focal pneumonia is evident. No substantial pleural effusion. No pneumothorax. No pulmonary edema. HEART AND MEDIASTINUM: The cardiac size is normal. Stable mediastinum with mild aortic atherosclerosis. No vascular congestion is seen. Sternotomy and cabg changes. BONES AND SOFT TISSUES: Kyphosis and degenerative change of the thoracic spine. No acute osseous abnormality. IMPRESSION: 1. No acute cardiopulmonary findings: Low lung volumes with perihilar atelectasis. . 2. Prior cabg. 3. Aortic atherosclerosis. Electronically signed by: Francis Quam MD 02/24/2024 06:29 AM EDT RP Workstation: [id number]   DG Abd 1 View Result Date: 02/23/2024 CLINICAL DATA:  Constipation EXAM: DG ABDOMEN 1V COMPARISON:  None Available. FINDINGS: Scattered large and small bowel gas is noted. No significant retained fecal material is noted. No abnormal mass or abnormal calcifications are seen. No free air is noted. Mild degenerative changes of lumbar spine are seen. IMPRESSION: No evidence of constipation.  No obstructive changes noted. Electronically Signed   By: Oneil Devonshire Cisneros.D.   On: 02/23/2024 15:46   DG Cervical Spine 2-3 Views Result Date: 02/22/2024 CLINICAL DATA:  Surgery EXAM: CERVICAL SPINE - 2-3 VIEW COMPARISON:  11/23/2017 FINDINGS: Multiple intraoperative spot images are submitted with posterior surgical instruments at  the C3-4 level. IMPRESSION: Intraoperative localization as above. Electronically Signed   By: Franky Crease Cisneros.D.   On: 02/22/2024 20:24   DG C-Arm 1-60 Min-No Report Result Date: 02/22/2024 Fluoroscopy was utilized by the requesting physician.  No radiographic interpretation.   DG C-Arm 1-60 Min-No Report Result Date: 02/22/2024 Fluoroscopy was utilized by the requesting physician.  No radiographic interpretation.     Assessment and Plan: Coronary artery disease with stable angina/elevated troponin - In  the setting of hypotension, tachycardia, anemia, hypoxia early morning October 15 - Agree with blood transfusion, goal hemoglobin 9 or higher - In the past has had increasing anginal symptoms off isosorbide  mononitrate 60 daily - Will restart isosorbide  mononitrate 30 mg qpm with titration upwards as blood pressure tolerates - Will look to add back metoprolol  succinate 25 daily as blood pressure allows - No plan for ischemic workup at this time Not a good candidate for cardiac catheterization given anemia, recent cervical surgery, hematemesis  2.  Status post posterior cervical laminoplasty C3-7 Denies significant pain Some difficulty swallowing, tolerating ice cream - Reports prior history of esophageal stricture requiring balloon dilation  3.  Anemia Hemoglobin 13.7 down to 7.7 Status post transfusion GI following Has had hematemesis October 14-15 overnight No further episodes -Goal hemoglobin 9 or higher given anginal symptoms  4.  Hypoxia/hypotension/tachycardia/rapid response Early evening February 24, 2024 In the setting of polypharmacy including Robaxin , Ambien , Dilaudid, Abilify, Xanax  -Appears to be doing better after weaning of several of these medications   For questions or updates, please contact [facility] Please consult [email or web address] for contact info under    Signed, Akacia Boltz, MD  02/25/2024 2:57 PM

## 2024-02-25 NOTE — Plan of Care (Signed)
  Problem: Clinical Measurements: Goal: Will remain free from infection Outcome: Not Progressing Goal: Diagnostic test results will improve Outcome: Progressing Goal: Respiratory complications will improve Outcome: Progressing Goal: Cardiovascular complication will be avoided Outcome: Not Progressing

## 2024-02-25 NOTE — Progress Notes (Signed)
 Rogelia Copping, MD [facility]   [address] [place], [place] [address] Phone: [phone] Fax : [phone]   Subjective: The patient was given fluid last night and had some chest discomfort with the troponin slightly rising.  The patient was noted to have a drop in hemoglobin from 8.6 yesterday to 7.7 at 2 AM earlier today.  The patient was transfused and his repeat hemoglobin was 9.8.  The patient has not had any further sign of any GI bleeding such as hematemesis or black stools.   Objective: Vital signs in last 24 hours: Vitals:   02/25/24 0600 02/25/24 0700 02/25/24 0800 02/25/24 0809  BP: 117/65 (!) 95/55 103/65 101/60  Pulse: 93 93 88 89  Resp: (!) 21 (!) 21 (!) 22 (!) 22  Temp:    97.8 F (36.6 C)  TempSrc:    Axillary  SpO2: (!) 89% 95% 95% 94%  Weight:      Height:       Weight change:   Intake/Output Summary (Last 24 hours) at 02/25/2024 1138 Last data filed at 02/25/2024 0900 Gross per 24 hour  Intake 3569.81 ml  Output 2645 ml  Net 924.81 ml     Exam: General: Patient in no apparent distress resting comfortably and appears much more comfortable today than he was yesterday.   Lab Results: @LABTEST2 @ Micro Results: Recent Results (from the past 240 hours)  Surgical pcr screen     Status: None   Collection Time: 02/17/24  9:09 AM   Specimen: Nasal Mucosa; Nasal Swab  Result Value Ref Range Status   MRSA, PCR NEGATIVE NEGATIVE Final   Staphylococcus aureus NEGATIVE NEGATIVE Final    Comment: (NOTE) The Xpert SA Assay (FDA approved for NASAL specimens in patients 63 years of age and older), is one component of a comprehensive surveillance program. It is not intended to diagnose infection nor to guide or monitor treatment. Performed at [facility], [address] [place], [place] [address]   Culture, blood (Routine X 2) w Reflex to ID Panel     Status: None (Preliminary result)   Collection Time: 02/24/24  3:23 PM   Specimen:  BLOOD  Result Value Ref Range Status   Specimen Description BLOOD LEFT ANTECUBITAL  Final   Special Requests   Final    BOTTLES DRAWN AEROBIC ONLY Blood Culture results may not be optimal due to an inadequate volume of blood received in culture bottles   Culture   Final    NO GROWTH < 24 HOURS Performed at [facility], [address], [place], [place] [address]    Report Status PENDING  Incomplete  Culture, blood (Routine X 2) w Reflex to ID Panel     Status: None (Preliminary result)   Collection Time: 02/24/24  3:25 PM   Specimen: BLOOD  Result Value Ref Range Status   Specimen Description BLOOD BLOOD LEFT FOREARM  Final   Special Requests   Final    BOTTLES DRAWN AEROBIC ONLY Blood Culture results may not be optimal due to an inadequate volume of blood received in culture bottles   Culture   Final    NO GROWTH < 24 HOURS Performed at [facility], [address] [place], [place] [address]    Report Status PENDING  Incomplete   Studies/Results: US  Venous Img Lower Bilateral (DVT) Result Date: 02/24/2024 CLINICAL DATA:  9965 Edema 9965 EXAM: BILATERAL LOWER EXTREMITY VENOUS DOPPLER ULTRASOUND TECHNIQUE: Gray-scale sonography with graded compression,  as well as color Doppler and duplex ultrasound were performed to evaluate the lower extremity deep venous systems from the level of the common femoral vein and including the common femoral, femoral, profunda femoral, popliteal and calf veins including the posterior tibial, peroneal and gastrocnemius veins when visible. The superficial great saphenous vein was also interrogated. Spectral Doppler was utilized to evaluate flow at rest and with distal augmentation maneuvers in the common femoral, femoral and popliteal veins. COMPARISON:  None Available. FINDINGS: RIGHT LOWER EXTREMITY Common Femoral Vein: No evidence of thrombus. Normal compressibility, respiratory phasicity and response to augmentation. Saphenofemoral  Junction: No evidence of thrombus. Normal compressibility and flow on color Doppler imaging. Profunda Femoral Vein: No evidence of thrombus. Normal compressibility and flow on color Doppler imaging. Femoral Vein: No evidence of thrombus. Normal compressibility, respiratory phasicity and response to augmentation. Popliteal Vein: No evidence of thrombus. Normal compressibility, respiratory phasicity and response to augmentation. Calf Veins: No evidence of thrombus. Normal compressibility and flow on color Doppler imaging. Superficial Great Saphenous Vein: No evidence of thrombus. Normal compressibility. Other Findings:  None. LEFT LOWER EXTREMITY Common Femoral Vein: No evidence of thrombus. Normal compressibility, respiratory phasicity and response to augmentation. Saphenofemoral Junction: No evidence of thrombus. Normal compressibility and flow on color Doppler imaging. Profunda Femoral Vein: No evidence of thrombus. Normal compressibility and flow on color Doppler imaging. Femoral Vein: No evidence of thrombus. Normal compressibility, respiratory phasicity and response to augmentation. Popliteal Vein: No evidence of thrombus. Normal compressibility, respiratory phasicity and response to augmentation. Calf Veins: No evidence of thrombus. Normal compressibility and flow on color Doppler imaging. Superficial Great Saphenous Vein: No evidence of thrombus. Normal compressibility. Other Findings:  Peripheral vascular atherosclerosis. IMPRESSION: Negative for deep venous thrombosis within both legs. Electronically Signed   By: Rogelia Myers M.D.   On: 02/24/2024 17:14   DG Chest Port 1 View Result Date: 02/24/2024 CLINICAL DATA:  [id number] Pulmonary edema [id number] EXAM: PORTABLE CHEST - 1 VIEW COMPARISON:  02/24/2024 5:50 a.m. FINDINGS: Low lung volumes. Similar streaky atelectasis in the perihilar regions bilaterally. No new airspace consolidation, pleural effusion, or pneumothorax. Mild cardiomegaly. Sternotomy wires  and CABG markers. Aortic atherosclerosis. No acute fracture or destructive lesions. Multilevel thoracic osteophytosis. IMPRESSION: Low lung volumes.  Otherwise, no acute cardiopulmonary abnormality. Electronically Signed   By: Rogelia Myers M.D.   On: 02/24/2024 09:23   CT HEAD WO CONTRAST ( ) Result Date: 02/24/2024 EXAM: CT HEAD WITHOUT CONTRAST 02/24/2024 07:11:12 AM TECHNIQUE: CT of the head was performed without the administration of intravenous contrast. Automated exposure control, iterative reconstruction, and/or weight based adjustment of the mA/kV was utilized to reduce the radiation dose to as low as reasonably achievable. COMPARISON: Head CT dated 11/23/2017. CLINICAL HISTORY: Mental status change, unknown cause. FINDINGS: BRAIN AND VENTRICLES: Mild to moderate cerebral atrophy, small vessel disease, and atrophic ventriculomegaly are again noted. There is relatively minimal cerebellar volume loss. No acute hemorrhage. No cortical based acute infarct. No mass effect or midline shift. No extra-axial collection. The basal cisterns are clear. There are calcifications in the distal right vertebral artery, the basilar artery, and both siphons. No hyperdense vessels. ORBITS: No acute abnormality. SINUSES: Paranasal sinuses and left mastoid air cells are clear. Trace chronic fluid again noted at the right mastoid tip. SOFT TISSUES AND SKULL: There are dorsal midline skin staples partially visible in the upper neck as well as a small amount of subcutaneous soft tissue gas, presumably left over from cervical laminoplasty done 2 days ago.  No focal skull lesions or skull fractures are seen. IMPRESSION: 1. No acute intracranial CT findings. Stable exam. Atrophy and small vessel changes. 2. Postoperative changes in the upper neck with dorsal midline skin staples and small volume subcutaneous gas related to recent cervical laminoplasty. Electronically signed by: Francis Quam MD 02/24/2024 07:39 AM EDT RP  Workstation: [id number]   CT ABDOMEN PELVIS WO CONTRAST Result Date: 02/24/2024 EXAM: CT ABDOMEN AND PELVIS WITHOUT CONTRAST 02/24/2024 07:11:12 AM TECHNIQUE: CT of the abdomen and pelvis was performed without the administration of intravenous contrast. Multiplanar reformatted images are provided for review. Automated exposure control, iterative reconstruction, and/or weight-based adjustment of the mA/kV was utilized to reduce the radiation dose to as low as reasonably achievable. COMPARISON: CT abdomen pelvis with iv contrast 02/16/2024. AP and lateral chest from 02/24/2024. CLINICAL HISTORY: Abdominal pain, acute, nonlocalized. FINDINGS: LOWER CHEST: Increased posterior atelectasis in both lower lobes. Increased bilateral lower lobe bronchial thickening and patchy hazy consolidation worrisome for bilateral pneumonia or aspiration. Subpleural linear scarring or atelectasis again noted in the lateral base of the right middle lobe with remaining lungs clear. CABG changes with native 3 vessel coronary calcifications and normal cardiac size. Moderate sized hiatal hernia. LIVER: The liver is mildly steatotic and otherwise unremarkable. GALLBLADDER AND BILE DUCTS: Gallbladder is unremarkable. No biliary ductal dilatation. SPLEEN: No acute abnormality. PANCREAS: No acute abnormality. ADRENAL GLANDS: No acute abnormality. KIDNEYS, URETERS AND BLADDER: No stones in the kidneys or ureters. No hydronephrosis. No perinephric or periureteral stranding. Urinary bladder is unremarkable. There is no contour deforming renal mass. GI AND BOWEL: There are retained food products in the proximal stomach and in the hiatal hernia. Normal caliber of the small bowel and appendix. Mild to moderate fecal stasis in the cecum and ascending colon. There are scattered descending and sigmoid diverticula but no evidence of diverticulitis. There is no bowel obstruction. PERITONEUM AND RETROPERITONEUM: No ascites. No free air. VASCULATURE: Aorta  is normal in caliber. Moderate aortic atherosclerosis without aneurysm. LYMPH NODES: No lymphadenopathy. REPRODUCTIVE ORGANS: No acute abnormality. BONES AND SOFT TISSUES: Degenerative changes thoracic and lumbar spine. No focal soft tissue abnormality. IMPRESSION: 1. Increased bilateral lower lobe bronchial thickening and patchy hazy consolidation, worrisome for bilateral pneumonia or aspiration. 2. Moderate sized hiatal hernia with retained food products in the proximal stomach and in the hernia. 3. Constipation and diverticulosis. 4. Aortic atherosclerosis. Electronically signed by: Francis Quam MD 02/24/2024 07:30 AM EDT RP Workstation: [id number]   DG Chest 2 View Result Date: 02/24/2024 EXAM: 2 VIEW(S) XRAY OF THE CHEST 02/24/2024 05:53:50 AM COMPARISON: Portable chest 10/09/2022. CLINICAL HISTORY: Chest pain [phone]. Chest pain/ pt had difficulty following instructions to remain still during exam. FINDINGS: LUNGS AND PLEURA: The lungs are expiratory. There is perihilar linear atelectasis. No focal pneumonia is evident. No substantial pleural effusion. No pneumothorax. No pulmonary edema. HEART AND MEDIASTINUM: The cardiac size is normal. Stable mediastinum with mild aortic atherosclerosis. No vascular congestion is seen. Sternotomy and cabg changes. BONES AND SOFT TISSUES: Kyphosis and degenerative change of the thoracic spine. No acute osseous abnormality. IMPRESSION: 1. No acute cardiopulmonary findings: Low lung volumes with perihilar atelectasis. . 2. Prior cabg. 3. Aortic atherosclerosis. Electronically signed by: Francis Quam MD 02/24/2024 06:29 AM EDT RP Workstation: [id number]   DG Abd 1 View Result Date: 02/23/2024 CLINICAL DATA:  Constipation EXAM: DG ABDOMEN 1V COMPARISON:  None Available. FINDINGS: Scattered large and small bowel gas is noted. No significant retained fecal material is noted. No abnormal mass or  abnormal calcifications are seen. No free air is noted. Mild degenerative  changes of lumbar spine are seen. IMPRESSION: No evidence of constipation.  No obstructive changes noted. Electronically Signed   By: Oneil Devonshire M.D.   On: 02/23/2024 15:46   Medications: I have reviewed the patient's current medications. Scheduled Meds:  acetaminophen   1,000 mg Oral Q6H   allopurinol  100 mg Oral Daily   arformoterol  15 mcg Nebulization BID   And   budesonide (PULMICORT) nebulizer solution  0.25 mg Nebulization BID   ascorbic acid  500 mg Oral Daily   bisacodyl  10 mg Oral QHS   buPROPion   300 mg Oral Daily   calcium  carbonate  1 tablet Oral TID WC   Chlorhexidine Gluconate Cloth  6 each Topical Daily   cyanocobalamin  1,000 mcg Intramuscular Daily   Followed by   NOREEN ON 03/02/2024] vitamin B-12  1,000 mcg Oral Daily   docusate sodium  100 mg Oral BID   iron polysaccharides  150 mg Oral Daily   levothyroxine  25 mcg Oral Q0600   methylPREDNISolone (SOLU-MEDROL) injection  40 mg Intravenous Q12H   Followed by   NOREEN ON 02/26/2024] methylPREDNISolone (SOLU-MEDROL) injection  40 mg Intravenous Q24H   midodrine  5 mg Oral TID WC   nitroGLYCERIN        pantoprazole  (PROTONIX ) IV  40 mg Intravenous Q8H   Followed by   NOREEN ON 02/27/2024] pantoprazole  (PROTONIX ) IV  40 mg Intravenous Q12H   polyethylene glycol  17 g Oral BID   rosuvastatin   20 mg Oral QHS   sodium bicarbonate  100 mEq Intravenous Once   sodium chloride  flush  3 mL Intravenous Q12H   Continuous Infusions:  sodium chloride      piperacillin-tazobactam (ZOSYN)  IV 3.375 g (02/25/24 0201)   promethazine (PHENERGAN) injection (IM or IVPB) Stopped (02/23/24 2208)   PRN Meds:.ALPRAZolam , bisacodyl, colchicine , iohexol , ipratropium-albuterol, magnesium  citrate, menthol **OR** phenol, midodrine **FOLLOWED BY** [START ON 02/28/2024] midodrine, naLOXone (NARCAN)  injection, nitroGLYCERIN , nitroGLYCERIN , ondansetron  **OR** ondansetron  (ZOFRAN ) IV, promethazine (PHENERGAN) injection (IM or IVPB),  simethicone , sodium chloride  flush, sodium chloride  flush, sorbitol   Assessment: Principal Problem:   Cervical myelopathy (HCC) Active Problems:   CAD with hx of CABG   Cervical spinal stenosis   Acute metabolic encephalopathy   Acute respiratory failure with hypoxia (HCC)   Nausea and vomiting   Coffee ground emesis    Plan: The patient has had no further sign of GI bleeding and did have a drop in his hemoglobin which could have been equilibration after his GI bleed.  The patient has had no black stools or bloody stools or further vomiting indicating that the patient had a limited GI bleed and unlikely bleeding any further.  The patient's respiratory status should be optimized before considering any GI intervention at this time.  If the patient should bleed acutely then we will reevaluate and he may need a EGD sooner.   LOS: 3 days   Rogelia Copping, MD.FACG 02/25/2024, 11:38 AM Pager [phone] 7am-5pm  Check AMION for 5pm -7am coverage and on weekends

## 2024-02-25 NOTE — Progress Notes (Addendum)
 Speech Language Pathology Treatment: Dysphagia  Patient Details Name: Billy Cisneros. MRN: [id number] DOB: 1950-09-03 Today's Date: 02/25/2024 Time: 0950-1030 SLP Time Calculation (min) (ACUTE ONLY): 40 min  Assessment / Plan / Recommendation Clinical Impression  Pt seen for ongoing assessment of swallowing and toleration of oral diet- pt was advanced to a clear liquids (thins) diet per MD last night per NSG. NSG reported pt was tolerating thins well and that pt's Cognitive status was much improved; pt stated he felt back to himself. Pt awake, verbal, A/O x3. Talkative and maintained attention to task.  Pt has Baseline h/o Significant Esophageal phase Dysmotility and MOD size Hiatal Hernia w/ events of Food Impaction and Regurgitation per pt/Wife. Also reported was GERD; Dilations x2-3 per Wife/pt.   On [place] O2 4L; afebrile. WBC WNL. HR WNL. Pt had recent N/V events per chart, report w/ concern for aspiration of the Vomit.     Pt appears to present w/ functional oropharyngeal phase swallowing w/ all consistencies as assessed during this session. No oropharyngeal phase dysphagia noted and No neuromuscular deficits noted w/ trials of thin liquids>solids. Pt consumed po trials w/ No overt clinical s/s of aspiration during the po trials.  Pt appears at reduced risk for aspiration from an oropharyngeal phase standpoint when following general aspiration precautions. However, in setting of pt's GI presentation of MOD Hiatal Hernia w/ Food Impaction events leading to ED visits per pt, regugitation events at home per pt, Esophageal Dilations(2-3x) per Wife/pt and recent laminectomy, deconditioning/weakness, and need for O2 support d/t Pulmonary status s/p N/V episodes(aspiration concern), pt's risk for dysphagia especially from a GI standpoint is increased, as well as his risk for decreased oral intake overall.    During the po trials of thin liquids VIA CUP(NO STRAWS for less air swallowed) and  solids(moistened), pt consumed consistencies w/ no overt coughing, decline in vocal quality, or change in respiratory presentation during/post trials. O2 sats remained 98%. Oral phase appeared [facility] w/ timely bolus management and control of bolus propulsion for A-P transfer for swallowing. Oral clearing achieved w/ trial consistencies. Pt fed self.    Recommend a fairly Regular diet (small-cut, moistened foods) w/ thin liquids VIA CUP -- NO STRAWS. General aspiration precautions. Rest Breaks during meals/oral intake to allow for Esophageal clearing. REFLUX precautions strongly recommended to lessen chance for Regurgitation -- discussed small bites/sips, alternating bites/sips, moistened foods, rest break during oral intake, and HOB elevated post meals and at night when sleeping. Pt is on a PPI. Discussed food options/prep w/ pt and encouraged No Salads. Pt prefers Pills in Puree for ease of swallowing/clearing the Esophagus.   Recommend pt f/u w/ GI for assessment/management of Esophageal phase Dysmotility, Hiatal Hernia, and REFLUX w/ tx as indicated. Discussion and handouts given on REFLUX, behaviors to manage REFLUX, and foods/diet. MD to reconsult ST services if any new needs while admitted. NSG updated. Pt appreciative of Education information. Precautions posted in room, chart.       HPI HPI: Pt is a 73 y.o. male with Current and PMH of MOD Hiatal Hernia w/ Food Impaction events leading to ED visits per pt, regugitation events at home per pt, Esophageal Dilations(2-3x) per Wife/pt, CAD s/p CABG, NSTEMI, unstable angina, HTN, HLD, hypothyroidism, CKD III, Obesity, GERD, cervical spinal stenosis with radiculopathy and myelopathy, thoracolumbar DDD, depression, anxiety, insomnia admitted on 02/22/2024 for surgical intervention for spinal stenosis and status post laminectomy on the 13th of this month.  The patient had a rapid response  called after he had Vomited yesterday with coffee-ground emesis and this  morning became Confused with difficulty to arouse and slurred speech.  The patient was transferred to the ICU with what appears to be possible aspiration pneumonia from the N/V episode.  The patient has also been having the hiccups.   CT Scan of the Abd: Increased bilateral lower lobe bronchial thickening and patchy hazy  consolidation, worrisome for Bilateral pneumonia or aspiration; increased posterior atelectasis in both lower lobes.  2. Moderate sized hiatal hernia with retained food products in the proximal  stomach and in the hernia.  3. Constipation and diverticulosis.  4. Aortic atherosclerosis.   Head CT: No acute intracranial CT findings. Stable exam. Atrophy and small vessel  changes.  2. Postoperative changes in the upper neck with dorsal midline skin staples and  small volume subcutaneous gas related to recent cervical laminoplasty.      SLP Plan  All goals met          Recommendations  Diet recommendations: Regular;Thin liquid Liquids provided via: Cup;No straw (less air swallowed) Medication Administration: Whole meds with puree (for ease) Supervision: Patient able to self feed Compensations: Minimize environmental distractions;Slow rate;Small sips/bites;Follow solids with liquid (Rest Breaks) Postural Changes and/or Swallow Maneuvers: Out of bed for meals;Seated upright 90 degrees;Upright 30-60 min after meal (REFLUX Precautions)                 (GI f/u for management, tx) Oral care BID;Patient independent with oral care (denture care)   Set up Supervision/Assistance Dysphagia, unspecified (R13.10) (Baseline Esophageal phase Dysmotility w/ MOD Hiatal Hernia and Impaction/Regurgitation events)     All goals met      Comer Portugal, MS, CCC-SLP Speech Language Pathologist Rehab Services; [facility] [place] [place] [phone] (ascom) Billy Cisneros  02/25/2024, 1:56 PM

## 2024-02-25 NOTE — Progress Notes (Signed)
   Neurosurgery Progress Note  History: Billy Cisneros. Is s/p C3-7 laminoplasty   POD3: Pt had chest pain and arm pain overnight prompting cardiac work up which showed elevation in his troponin and worsening anemia. He was given blood  POD2: Pt experienced confusion, rigors, nausea, and vomiting overnight. He was transferred to ICU and workup is underway for cause.  POD1: Pt experiencing urinary retention this morning requiring I&O cath. Reported expected pain in his shoulders but is otherwise doing well.   Physical Exam: Vitals:   02/25/24 0600 02/25/24 0700  BP: 117/65 (!) 95/55  Pulse: 93 93  Resp: (!) 21 (!) 21  Temp:    SpO2: (!) 89% 95%    Drowsy and disoriented. Speech is slurred   Strength:  MAEW but unable to participate in strength exam.   Incision: dressing with moderate amount of fresh appear blood. No pooling or obvious active drainage.   Data:  Other tests/results:  Lab Results  Component Value Date   CREATININE 1.47 (H) 02/25/2024   CREATININE 1.55 (H) 02/24/2024   CREATININE 1.52 (H) 02/24/2024      Latest Ref Rng & Units 02/25/2024    2:06 AM 02/24/2024   11:41 PM 02/24/2024    6:07 PM  CBC  WBC 4.0 - 10.5 K/uL 9.2     Hemoglobin 13.0 - 17.0 g/dL 7.7  8.0  8.6   Hematocrit 39.0 - 52.0 % 22.1     Platelets 150 - 400 K/uL 152         Latest Ref Rng & Units 02/25/2024    2:06 AM 02/24/2024   11:41 PM 02/24/2024    6:12 AM  CMP  Glucose 70 - 99 mg/dL 839  836  878   BUN 8 - 23 mg/dL 26  27  36   Creatinine 0.61 - 1.24 mg/dL 8.52  8.44  8.47   Sodium 135 - 145 mmol/L 140  140  136   Potassium 3.5 - 5.1 mmol/L 4.3  4.4  4.6   Chloride 98 - 111 mmol/L 112  110  104   CO2 22 - 32 mmol/L 20  20  21    Calcium  8.9 - 10.3 mg/dL 7.4  7.7  8.0   Total Protein 6.5 - 8.1 g/dL   5.6   Total Bilirubin 0.0 - 1.2 mg/dL   0.7   Alkaline Phos 38 - 126 U/L   38   AST 15 - 41 U/L   34   ALT 0 - 44 U/L   26      Assessment/Plan:  Billy Cisneros. Is a 73 y.o presenting with cervical stenosis and myelopathy s/p laminoplasty.  Pt suffered chest pain overnight requiring nitroglycerin  and a unit of blood.   - CT showing concern for aspiration pneumonia. Started on Unasyn  - pain control; holding due to cognitive status - made patient NPO until cognition improves. Ok to resume diet when ok with medicine.  - DVT prophylaxis - HV removed on 10/14.  - GI consulted and recommending outpatient follow up for uncontrolled reflux and hiatal hernia  - PTOT; dispo planning underway  - remainder of care per medicine team. We are very appreciative of your assistance  Edsel Goods PA-C Department of Neurosurgery

## 2024-02-26 DIAGNOSIS — D62 Acute posthemorrhagic anemia: Secondary | ICD-10-CM

## 2024-02-26 DIAGNOSIS — I214 Non-ST elevation (NSTEMI) myocardial infarction: Secondary | ICD-10-CM

## 2024-02-26 DIAGNOSIS — Z951 Presence of aortocoronary bypass graft: Secondary | ICD-10-CM | POA: Diagnosis not present

## 2024-02-26 DIAGNOSIS — G959 Disease of spinal cord, unspecified: Secondary | ICD-10-CM | POA: Diagnosis not present

## 2024-02-26 DIAGNOSIS — I200 Unstable angina: Secondary | ICD-10-CM | POA: Diagnosis not present

## 2024-02-26 DIAGNOSIS — R7989 Other specified abnormal findings of blood chemistry: Secondary | ICD-10-CM | POA: Diagnosis not present

## 2024-02-26 DIAGNOSIS — K920 Hematemesis: Secondary | ICD-10-CM | POA: Diagnosis not present

## 2024-02-26 DIAGNOSIS — E782 Mixed hyperlipidemia: Secondary | ICD-10-CM

## 2024-02-26 LAB — TYPE AND SCREEN
ABO/RH(D): O POS
Antibody Screen: NEGATIVE
Unit division: 0

## 2024-02-26 LAB — CBC
HCT: 24.7 % — ABNORMAL LOW (ref 39.0–52.0)
Hemoglobin: 8.5 g/dL — ABNORMAL LOW (ref 13.0–17.0)
MCH: 31.4 pg (ref 26.0–34.0)
MCHC: 34.4 g/dL (ref 30.0–36.0)
MCV: 91.1 fL (ref 80.0–100.0)
Platelets: 202 K/uL (ref 150–400)
RBC: 2.71 MIL/uL — ABNORMAL LOW (ref 4.22–5.81)
RDW: 14.2 % (ref 11.5–15.5)
WBC: 10.9 K/uL — ABNORMAL HIGH (ref 4.0–10.5)
nRBC: 0.2 % (ref 0.0–0.2)

## 2024-02-26 LAB — BPAM RBC
Blood Product Expiration Date: 202511172359
ISSUE DATE / TIME: 202510160445
Unit Type and Rh: 5100

## 2024-02-26 LAB — BASIC METABOLIC PANEL WITH GFR
Anion gap: 7 (ref 5–15)
BUN: 24 mg/dL — ABNORMAL HIGH (ref 8–23)
CO2: 23 mmol/L (ref 22–32)
Calcium: 7.8 mg/dL — ABNORMAL LOW (ref 8.9–10.3)
Chloride: 111 mmol/L (ref 98–111)
Creatinine, Ser: 1.55 mg/dL — ABNORMAL HIGH (ref 0.61–1.24)
GFR, Estimated: 48 mL/min — ABNORMAL LOW (ref >60)
Glucose, Bld: 142 mg/dL — ABNORMAL HIGH (ref 70–99)
Potassium: 4.3 mmol/L (ref 3.5–5.1)
Sodium: 141 mmol/L (ref 135–145)

## 2024-02-26 LAB — HEMOGLOBIN
Hemoglobin: 8.5 g/dL — ABNORMAL LOW (ref 13.0–17.0)
Hemoglobin: 9 g/dL — ABNORMAL LOW (ref 13.0–17.0)
Hemoglobin: 9.6 g/dL — ABNORMAL LOW (ref 13.0–17.0)

## 2024-02-26 LAB — PHOSPHORUS: Phosphorus: 3.3 mg/dL (ref 2.5–4.6)

## 2024-02-26 LAB — MAGNESIUM: Magnesium: 2.6 mg/dL — ABNORMAL HIGH (ref 1.7–2.4)

## 2024-02-26 MED ORDER — ALUM & MAG HYDROXIDE-SIMETH 200-200-20 MG/5ML PO SUSP
15.0000 mL | ORAL | Status: DC | PRN
  Administered 2024-02-26: 15 mL via ORAL
  Filled 2024-02-26: qty 30

## 2024-02-26 MED FILL — Zolpidem Tartrate Tab 5 MG: 5.0000 mg | ORAL | Qty: 1 | Status: CN

## 2024-02-26 MED FILL — Zolpidem Tartrate Tab 5 MG: 5.0000 mg | ORAL | Qty: 1 | Status: AC

## 2024-02-26 NOTE — Progress Notes (Addendum)
 Occupational Therapy Treatment Patient Details Name: Billy Cisneros. MRN: [id number] DOB: 1950/12/16 Today's Date: 02/26/2024   History of present illness Billy Cisneros. Is s/p C3-7 laminoplasty. Rapid response called 10/14 and pt transferred to ICU wit hencephalopathy, symptomatic hypotension with associated hematemesis and hypovolemic shock exacerbated by polypharmacy and demand ischemia   OT comments  Chart reviewed to date, pt greeted semi supine in bed, agreeable to OT tx session targeting improving functional activity tolerance in prep for ADL tasks. Pt is making progress towards goals including amb attempts with RW on this date with no reported chest pain. Educated pt on importance of out of bed for meals, wife/pt re: safer ADL completion, home safety. Cervical dressing with light red drainage (unchanged pre/post session), notified nurse. Pt is left in chair, all needs met. OT will continue to follow.       If plan is discharge home, recommend the following:  Assistance with cooking/housework;Assist for transportation;Help with stairs or ramp for entrance;A little help with walking and/or transfers;A little help with bathing/dressing/bathroom   Equipment Recommendations  Tub/shower seat;BSC/3in1    Recommendations for Other Services      Precautions / Restrictions Precautions Precautions: Cervical Recall of Precautions/Restrictions: Intact Restrictions Weight Bearing Restrictions Per Provider Order: No       Mobility Bed Mobility Overal bed mobility: Needs Assistance Bed Mobility: Rolling, Sidelying to Sit Rolling: Min assist Sidelying to sit: Mod assist            Transfers Overall transfer level: Needs assistance Equipment used: Rolling walker (2 wheels) Transfers: Sit to/from Stand Sit to Stand: Contact guard assist                 Balance Overall balance assessment: Needs assistance Sitting-balance support: Feet supported, Single  extremity supported Sitting balance-Leahy Scale: Good     Standing balance support: Bilateral upper extremity supported, During functional activity, Reliant on assistive device for balance Standing balance-Leahy Scale: Fair                             ADL either performed or assessed with clinical judgement   ADL Overall ADL's : Needs assistance/impaired     Grooming: Set up;Sitting               Lower Body Dressing: Contact guard assist Lower Body Dressing Details (indicate cue type and reason): simulated with cueing for figure 4 Toilet Transfer: Contact guard assist;Rolling walker (2 wheels) Toilet Transfer Details (indicate cue type and reason): short amb transfer to bedside chair, simulated         Functional mobility during ADLs: Contact guard assist;Rolling walker (2 wheels) (approx 6' in room forward and back)      Extremity/Trunk Assessment Upper Extremity Assessment Upper Extremity Assessment: Generalized weakness   Lower Extremity Assessment Lower Extremity Assessment: Generalized weakness        Vision Patient Visual Report: No change from baseline     Perception     Praxis     Communication Communication Communication: Impaired Factors Affecting Communication: Hearing impaired   Cognition Arousal: Alert Behavior During Therapy: WFL for tasks assessed/performed Cognition: No apparent impairments                               Following commands: Intact        Cueing   Cueing Techniques: Verbal cues  Exercises  Other Exercises Other Exercises: edu pt/wife re role of OT, role of rehab, discharge recommendations, safe ADL completion with AE/DME    Shoulder Instructions       General Comments vss on 2 L via Pillow    Pertinent Vitals/ Pain       Pain Assessment Pain Assessment: 0-10 Pain Score: 0-No pain  Home Living                                          Prior Functioning/Environment               Frequency  Min 2X/week        Progress Toward Goals  OT Goals(current goals can now be found in the care plan section)  Progress towards OT goals: Progressing toward goals  Acute Rehab OT Goals Time For Goal Achievement: 03/08/24  Plan      Co-evaluation                 AM-PAC OT 6 Clicks Daily Activity     Outcome Measure   Help from another person eating meals?: None Help from another person taking care of personal grooming?: None Help from another person toileting, which includes using toliet, bedpan, or urinal?: A Little Help from another person bathing (including washing, rinsing, drying)?: A Little Help from another person to put on and taking off regular upper body clothing?: A Little Help from another person to put on and taking off regular lower body clothing?: A Little 6 Click Score: 20    End of Session Equipment Utilized During Treatment: Rolling walker (2 wheels)  OT Visit Diagnosis: Unsteadiness on feet (R26.81);Repeated falls (R29.6);Muscle weakness (generalized) (M62.81)   Activity Tolerance Patient tolerated treatment well   Patient Left in chair;with call bell/phone within reach;with nursing/sitter in room;with family/visitor present   Nurse Communication Mobility status        Time: 8641-8565 OT Time Calculation (min): 36 min  Charges: OT General Charges $OT Visit: 1 Visit OT Treatments $Self Care/Home Management : 8-22 mins $Therapeutic Activity: 8-22 mins  Therisa Sheffield, OTD OTR/L  02/26/24, 4:18 PM

## 2024-02-26 NOTE — Progress Notes (Signed)
 Progress Note   Patient: Billy Cisneros. FMW:[id number] DOB: 1950-07-02 DOA: 02/22/2024     4 DOS: the patient was seen and examined on 02/26/2024   Brief hospital course:   Billy Ishler. is a 73 y.o. male with past medical history of CAD s/p CABG x 5, HTN, spinal stenosis s/p laminectomy 10/13 (prior cardiac clearance on 10/6) being seen in urgent consultation during a rapid response.  According to bedside nurse, patient had normal mentation at 4 AM however couple hours later he was difficult to arouse and had slurred speech.  Vitals revealed hypotension and hypoxia, all prompting the rapid response.  Nurse also reports that the day prior patient had an episode of coffee-ground emesis and did appear a bit confused during the afternoon hours.   Of note, patient did receive several meds over the course of the past 24 hours related to vomiting/insomnia/anxiety depression including Phenergan, Ambien , methocarbamol  and alprazolam  at 2148 on 10/14, and hydromorphone 0.5 mg at 2041.   10/15 replaced which was called today early morning and TRH was consulted for comanagement.     Assessment and Plan:   # Acute hypoxic respiratory failure secondary to aspiration pneumonia Continue supplemental nutrition and gradually wean S/p Solu-Medrol 125 mg x 1 dose, followed by 40 mg IV every 12 hourly x 2 doses and 40 mg daily for 2 doses. Continue Brovana and Pulmicort nebulizer twice daily DuoNeb every 6 hourly     # Hypotension most likely secondary to sepsis IV fluid bolus given Avoid excessive fluid due to respiratory failure Continue midodrine and follow parameters Critical care consulted but patient remained stable, did not require pressure support, breathing well now.  PCCM signed off.     # Aspiration pneumonia, as patient had an episode of coffee-ground emesis most likely due to altered mental status Started Unasyn Pharmacy consulted Check blood cultures   # Acute metabolic  encephalopathy, multifactorial CT head negative for any acute findings Avoid sedating medications Reorient, continue fall precautions Continue supportive care 10/16 resolved, currently back to baseline   # Coffee-ground emesis, possible upper GI bleed Continue PPI Hb 104> 8.3>7.7 (1u prbc)> 9.8 Monitor H&H GI consulted, Rec PPI and conservative management, not stable for scope at this time, will need EGD when stable   # Spinal stenosis, s/p laminectomy done on 10/3 Continue as needed pain meds Follow neurosurgery   # AKI and urinary retention 10/15 Foley catheter was inserted and 500 mL urine was collected Avoid nephrotic medication, use renal dose medications Check renal functions daily sCr 1.75>1.82>1.52> 1.47 CO2 20, mild acidosis, sodium bicarbonate 100 mEq IV x 1 dose given Check BMP daily     # CAD s/p CABG, HTN, HLD 10/15 hypotensive possible due to sepsis Discontinued antihypertensive medications for now (Home meds Imdur , lisinopril , Toprol -XL, Crestor ) Hold aspirin  for now due to possible GI bleeding Monitor BP and titrate medications accordingly Started midodrine, if not able to swallow then patient may need pressure support Critical care consulted. 10/16 chest pain, mildly elevated troponin most likely due to demand ischemia secondary to anemia.  Patient received 1 unit of PRBC Continue nitroglycerin  as needed Follow cardiology consult     # Hypophosphatemia, due to nutritional deficiency.  Phos repleted. # Hypomagnesemia, mag repleted. Monitor electrolytes daily and replete as needed.   # Vitamin B12 deficiency: B12 level 207, goal >400.  Started vitamin B12 1000 mcg IM injection daily during hospital stay, followed by oral supplement.  Follow-up PCP to repeat  vitamin B12 level after 3 to 6 months.   # Iron deficiency, start oral iron supplement with vitamin C when stable.  I have added IV iron due to possible active infection.   # Constipation, started  laxatives.   # Gout: Allopurinol # Depression: Continue Abilify and Wellbutrin  # Hypothyroid: Synthroid     Body mass index is 30.19 kg/m.  Interventions:       Diet: Regular diet DVT Prophylaxis: SCDs    Advance goals of care discussion: Full code   Family Communication: family was present at bedside, at the time of interview.  The pt provided permission to discuss medical plan with the family. Opportunity was given to ask question and all questions were answered satisfactorily.  10/15 discussed with patient's wife at bedside   Disposition:  Pt is from Home, admitted with laminectomy, developed aspiration pneumonia, hematemesis, hypotension, multiple other conditions as dictated above, still very sick and not stable, which precludes a safe discharge. Discharge to home with home health versus SNF, TBD, when stable, may need few days to improve. TRH is a Psychologist, counselling. Primary team is neurosurgery   Subjective:  Patient seen and examined at bedside this morning Respiratory function improving Denies nausea vomiting abdominal pain chest pain cough   Physical Exam: General: Respiratory distress, pleasantly disoriented Eyes: PERRLA ENT: Oral Mucosa Clear, moist  Neck: no JVD,  Cardiovascular: S1 and S2 Present, no Murmur,  Respiratory: Creased respiratory rate, short of breath, bilateral crackles, mild wheezes Abdomen: BS present, Soft and no tenderness,  Skin: no rashes Extremities: no Pedal edema, no calf tenderness Neurologic: without any new focal findings Gait not checked due to patient safety concerns    Data Reviewed:     Latest Ref Rng & Units 02/26/2024    5:54 AM 02/25/2024    2:04 PM 02/25/2024    2:06 AM  BMP  Glucose 70 - 99 mg/dL 857  857  839   BUN 8 - 23 mg/dL 24  26  26    Creatinine 0.61 - 1.24 mg/dL 8.44  8.45  8.52   Sodium 135 - 145 mmol/L 141  143  140   Potassium 3.5 - 5.1 mmol/L 4.3  3.9  4.3   Chloride 98 - 111 mmol/L 111  109  112    CO2 22 - 32 mmol/L 23  22  20    Calcium  8.9 - 10.3 mg/dL 7.8  7.9  7.4        Latest Ref Rng & Units 02/26/2024   12:05 PM 02/26/2024    5:54 AM 02/26/2024   12:20 AM  CBC  WBC 4.0 - 10.5 K/uL  10.9    Hemoglobin 13.0 - 17.0 g/dL 9.0  8.5  8.5   Hematocrit 39.0 - 52.0 %  24.7    Platelets 150 - 400 K/uL  202      Vitals:   02/26/24 1200 02/26/24 1300 02/26/24 1400 02/26/24 1500  BP:   (!) 151/78 132/69  Pulse: (!) 103 (!) 108 (!) 106 100  Resp: (!) 22 (!) 27 (!) 23 (!) 22  Temp:      TempSrc:      SpO2: 93% 93% 94% 95%  Weight:      Height:         Author: Drue ONEIDA Potter, MD 02/26/2024 4:44 PM  For on call review [email or web address].

## 2024-02-26 NOTE — Progress Notes (Signed)
 Progress Note  Patient Name: Billy Cisneros. Date of Encounter: 02/26/2024  Primary Cardiologist: Hilty  Subjective   Chest pain resolved following reinitiation of isosorbide .  Hemoglobin trended from 7.7-9.8 on 10/16 following PRBC with current hemoglobin downtrending to 8.5 this morning.  He denies any melena, hematochezia, hemoptysis, or further hematemesis.  Hemodynamically stable.  Inpatient Medications    Scheduled Meds:  acetaminophen   1,000 mg Oral Q6H   allopurinol  100 mg Oral Daily   arformoterol  15 mcg Nebulization BID   And   budesonide (PULMICORT) nebulizer solution  0.25 mg Nebulization BID   ascorbic acid  500 mg Oral Daily   bisacodyl  10 mg Oral QHS   buPROPion   300 mg Oral Daily   calcium  carbonate  1 tablet Oral TID WC   Chlorhexidine Gluconate Cloth  6 each Topical Daily   cyanocobalamin  1,000 mcg Intramuscular Daily   Followed by   NOREEN ON 03/02/2024] vitamin B-12  1,000 mcg Oral Daily   docusate sodium  100 mg Oral BID   iron polysaccharides  150 mg Oral Daily   isosorbide  mononitrate  30 mg Oral QPM   levothyroxine  25 mcg Oral Q0600   methylPREDNISolone (SOLU-MEDROL) injection  40 mg Intravenous Q24H   pantoprazole  (PROTONIX ) IV  40 mg Intravenous Q8H   Followed by   [START ON 02/27/2024] pantoprazole  (PROTONIX ) IV  40 mg Intravenous Q12H   polyethylene glycol  17 g Oral BID   rosuvastatin   20 mg Oral QHS   sodium chloride  flush  3 mL Intravenous Q12H   Continuous Infusions:  piperacillin-tazobactam (ZOSYN)  IV Stopped (02/26/24 0501)   promethazine (PHENERGAN) injection (IM or IVPB) Stopped (02/23/24 2208)   PRN Meds: ALPRAZolam , bisacodyl, colchicine , iohexol , ipratropium-albuterol, magnesium  citrate, menthol **OR** phenol, naLOXone (NARCAN)  injection, nitroGLYCERIN , ondansetron  **OR** ondansetron  (ZOFRAN ) IV, promethazine (PHENERGAN) injection (IM or IVPB), simethicone , sodium chloride  flush, sodium chloride  flush, sorbitol    Vital Signs    Vitals:   02/26/24 0400 02/26/24 0500 02/26/24 0600 02/26/24 0700  BP: 126/70 126/72 139/81 122/67  Pulse: (!) 102 98 100 94  Resp: (!) 24 (!) 21 (!) 21 (!) 22  Temp: 98.6 F (37 C)     TempSrc: Oral     SpO2: 90% 90% 93% 91%  Weight:      Height:        Intake/Output Summary (Last 24 hours) at 02/26/2024 0827 Last data filed at 02/26/2024 0650 Gross per 24 hour  Intake 1044.21 ml  Output 1795 ml  Net -750.79 ml   Filed Weights   02/24/24 0721  Weight: 82.3 kg    Telemetry    Sinus rhythm with sinus tachycardia with rates in the 80s to low 100s bpm - Personally Reviewed  ECG    No new tracings - Personally Reviewed  Physical Exam   GEN: No acute distress.   Neck: No JVD. Cardiac: RRR, no murmurs, rubs, or gallops.  Respiratory: Clear to auscultation bilaterally.  GI: Soft, nontender, non-distended.   MS: No edema; No deformity. Neuro:  Alert and oriented x 3; Nonfocal.  Psych: Normal affect.  Labs    Chemistry Recent Labs  Lab 02/23/24 1847 02/24/24 0612 02/24/24 2341 02/25/24 0206 02/25/24 1404 02/26/24 0554  NA 136 136   < > 140 143 141  K 4.7 4.6   < > 4.3 3.9 4.3  CL 99 104   < > 112* 109 111  CO2 25 21*   < >  20* 22 23  GLUCOSE 163* 121*   < > 160* 142* 142*  BUN 42* 36*   < > 26* 26* 24*  CREATININE 1.82* 1.52*   < > 1.47* 1.54* 1.55*  CALCIUM  8.5* 8.0*   < > 7.4* 7.9* 7.8*  PROT 6.0* 5.6*  --   --   --   --   ALBUMIN 3.4* 3.3*  --   --   --   --   AST 40 34  --   --   --   --   ALT 33 26  --   --   --   --   ALKPHOS 48 38  --   --   --   --   BILITOT 0.8 0.7  --   --   --   --   GFRNONAA 39* 49*   < > 51* 48* 48*  ANIONGAP 12 11   < > 8 12 7    < > = values in this interval not displayed.     Hematology Recent Labs  Lab 02/24/24 0612 02/24/24 1155 02/25/24 0206 02/25/24 1026 02/25/24 1404 02/25/24 1823 02/26/24 0020 02/26/24 0554  WBC 10.2  --  9.2  --   --   --   --  10.9*  RBC 3.15*  --  2.41*  --   --    --   --  2.71*  HGB 9.7*   < > 7.7* 9.8*   < > 8.4* 8.5* 8.5*  HCT 29.1*  --  22.1* 29.4*  --   --   --  24.7*  MCV 92.4  --  91.7  --   --   --   --  91.1  MCH 30.8  --  32.0  --   --   --   --  31.4  MCHC 33.3  --  34.8  --   --   --   --  34.4  RDW 13.4  --  13.5  --   --   --   --  14.2  PLT 214  --  152  --   --   --   --  202   < > = values in this interval not displayed.    Cardiac EnzymesNo results for input(s): TROPONINI in the last 168 hours. No results for input(s): TROPIPOC in the last 168 hours.   BNP Recent Labs  Lab 02/24/24 0612  BNP 24.5     DDimer No results for input(s): DDIMER in the last 168 hours.   Radiology    US  Venous Img Lower Bilateral (DVT) Result Date: 02/24/2024 IMPRESSION: Negative for deep venous thrombosis within both legs. Electronically Signed   By: Rogelia Myers M.D.   On: 02/24/2024 17:14   DG Chest Port 1 View Result Date: 02/24/2024 IMPRESSION: Low lung volumes.  Otherwise, no acute cardiopulmonary abnormality. Electronically Signed   By: Rogelia Myers M.D.   On: 02/24/2024 09:23    Cardiac Studies   2D echo 02/02/2024: 1. Left ventricular ejection fraction, by estimation, is 55 to 60%. The  left ventricle has normal function. The left ventricle has no regional  wall motion abnormalities. Left ventricular diastolic parameters are  indeterminate.   2. Right ventricular systolic function is normal. The right ventricular  size is normal. Tricuspid regurgitation signal is inadequate for assessing  PA pressure.   3. The mitral valve is normal in structure. Trivial mitral valve  regurgitation. No evidence of mitral  stenosis.   4. The aortic valve is normal in structure. Aortic valve regurgitation is  not visualized. No aortic stenosis is present.   5. The inferior vena cava is normal in size with greater than 50%  respiratory variability, suggesting right atrial pressure of 3 mmHg.  __________  Myocardial PET/CT  02/11/2024:   LV perfusion is abnormal. Defect 1: There is a medium defect with moderate to severe reduction in uptake present in the apical to mid anterior and anterolateral location(s) that is partially reversible. There is abnormal wall motion in the defect area. Consistent with infarction and peri-infarct ischemia.   Rest left ventricular function is normal. Rest EF: 67%. Stress left ventricular function is normal. Stress EF: 68%. End diastolic cavity size is normal.   Myocardial blood flow reserve is not reported in this patient due to technical or patient-specific concerns that affect accuracy.   Coronary calcium  assessment not performed due to prior revascularization.   Findings are consistent with infarction with peri-infarct ischemia. The study is intermediate risk.  Patient Profile     73 y.o. male with history of CAD status post 5 vessel CABG in 2000 with LIMA to LAD, RIMA to RCA, SVG to OM1, diagonal and ramus with patent grafts by [facility] in 09/2022, HTN, GERD, and gout who underwent cervical laminoplasty on 10/13 with admission complicated by encephalopathy, symptomatic hypotension with associated hematemesis and hypovolemic shock exacerbated by polypharmacy and demand ischemia who we are seeing for chest pain in the setting of hemoglobin trend from 13.7 down to 7.7.  Assessment & Plan    1.  CAD status post CABG with precordial pain and elevated high-sensitivity troponin: - Suspect chest pain is in the setting of underlying native vessel CAD with symptomatic anemia and elevated troponin is related to demand ischemia in the setting of the above - Patient is not a candidate for invasive ischemic evaluation with symptomatic transfusion dependent anemia - Chest pain is resolved following reinitiation of Imdur  with recommendation to titrate as indicated for antianginal effect -No indication for heparin  drip with symptomatic, transfusion dependent anemia, and with minimal troponin elevation not  consistent with ACS - Resume aspirin  when able per GI/internal medicine - PTA rosuvastatin   2.  Hematemesis with symptomatic anemia: - Hemoglobin trending from 13 7 down to 7.7 status post PRBC with hemoglobin trending from 9.8 down to 8.5 following transfusion - Denies active bleeding - Management per internal medicine      For questions or updates, please contact [facility] Please consult [email or web address] for contact info under Cardiology/STEMI.    Signed, Bernardino Bring, PA-C [facility] Pager: [phone] 02/26/2024, 8:27 AM

## 2024-02-26 NOTE — Plan of Care (Signed)
  Problem: Education: Goal: Knowledge of General Education information will improve Description: Including pain rating scale, medication(s)/side effects and non-pharmacologic comfort measures Outcome: Progressing   Problem: Clinical Measurements: Goal: Ability to maintain clinical measurements within normal limits will improve Outcome: Progressing Goal: Diagnostic test results will improve Outcome: Progressing Goal: Cardiovascular complication will be avoided Outcome: Progressing   Problem: Clinical Measurements: Goal: Ability to maintain clinical measurements within normal limits will improve Outcome: Progressing

## 2024-02-26 NOTE — Progress Notes (Signed)
 Billy Copping, MD [facility]   [address], [address] [place], [place] [address] Phone: [phone] Fax : [phone]   Subjective: The patient is sitting in bed in no apparent distress.  He is still short of breath and reports that he is having some chest pain today.  The patient has had no sign of any GI bleeding or black stool since his initial GI bleed. There is no report of any abdominal pain.   Objective: Vital signs in last 24 hours: Vitals:   02/26/24 0400 02/26/24 0500 02/26/24 0600 02/26/24 0700  BP: 126/70 126/72 139/81 122/67  Pulse: (!) 102 98 100 94  Resp: (!) 24 (!) 21 (!) 21 (!) 22  Temp: 98.6 F (37 C)     TempSrc: Oral     SpO2: 90% 90% 93% 91%  Weight:      Height:       Weight change:   Intake/Output Summary (Last 24 hours) at 02/26/2024 1015 Last data filed at 02/26/2024 0957 Gross per 24 hour  Intake 1282.21 ml  Output 1750 ml  Net -467.79 ml     Exam: General: The patient is alert and oriented x 3 no apparent distress   Lab Results: @LABTEST2 @ Micro Results: Recent Results (from the past 240 hours)  Surgical pcr screen     Status: None   Collection Time: 02/17/24  9:09 AM   Specimen: Nasal Mucosa; Nasal Swab  Result Value Ref Range Status   MRSA, PCR NEGATIVE NEGATIVE Final   Staphylococcus aureus NEGATIVE NEGATIVE Final    Comment: (NOTE) The Xpert SA Assay (FDA approved for NASAL specimens in patients 88 years of age and older), is one component of a comprehensive surveillance program. It is not intended to diagnose infection nor to guide or monitor treatment. Performed at [facility], [address] [place], [place] [address]   Culture, blood (Routine X 2) w Reflex to ID Panel     Status: None (Preliminary result)   Collection Time: 02/24/24  3:23 PM   Specimen: BLOOD  Result Value Ref Range Status   Specimen Description BLOOD LEFT ANTECUBITAL  Final   Special Requests   Final    BOTTLES DRAWN AEROBIC ONLY Blood  Culture results may not be optimal due to an inadequate volume of blood received in culture bottles   Culture   Final    NO GROWTH 2 DAYS Performed at [facility], [address] [place], [place] [address]    Report Status PENDING  Incomplete  Culture, blood (Routine X 2) w Reflex to ID Panel     Status: None (Preliminary result)   Collection Time: 02/24/24  3:25 PM   Specimen: BLOOD  Result Value Ref Range Status   Specimen Description BLOOD BLOOD LEFT FOREARM  Final   Special Requests   Final    BOTTLES DRAWN AEROBIC ONLY Blood Culture results may not be optimal due to an inadequate volume of blood received in culture bottles   Culture   Final    NO GROWTH 2 DAYS Performed at [facility], [address], [place], [place] [address]    Report Status PENDING  Incomplete   Studies/Results: US  Venous Img Lower Bilateral (DVT) Result Date: 02/24/2024 CLINICAL DATA:  9965 Edema 9965 EXAM: BILATERAL LOWER EXTREMITY VENOUS DOPPLER ULTRASOUND TECHNIQUE: Gray-scale sonography with graded compression, as well as color Doppler and duplex ultrasound were performed to evaluate the lower extremity deep venous systems from the level of the  common femoral vein and including the common femoral, femoral, profunda femoral, popliteal and calf veins including the posterior tibial, peroneal and gastrocnemius veins when visible. The superficial great saphenous vein was also interrogated. Spectral Doppler was utilized to evaluate flow at rest and with distal augmentation maneuvers in the common femoral, femoral and popliteal veins. COMPARISON:  None Available. FINDINGS: RIGHT LOWER EXTREMITY Common Femoral Vein: No evidence of thrombus. Normal compressibility, respiratory phasicity and response to augmentation. Saphenofemoral Junction: No evidence of thrombus. Normal compressibility and flow on color Doppler imaging. Profunda Femoral Vein: No evidence of thrombus. Normal compressibility and flow  on color Doppler imaging. Femoral Vein: No evidence of thrombus. Normal compressibility, respiratory phasicity and response to augmentation. Popliteal Vein: No evidence of thrombus. Normal compressibility, respiratory phasicity and response to augmentation. Calf Veins: No evidence of thrombus. Normal compressibility and flow on color Doppler imaging. Superficial Great Saphenous Vein: No evidence of thrombus. Normal compressibility. Other Findings:  None. LEFT LOWER EXTREMITY Common Femoral Vein: No evidence of thrombus. Normal compressibility, respiratory phasicity and response to augmentation. Saphenofemoral Junction: No evidence of thrombus. Normal compressibility and flow on color Doppler imaging. Profunda Femoral Vein: No evidence of thrombus. Normal compressibility and flow on color Doppler imaging. Femoral Vein: No evidence of thrombus. Normal compressibility, respiratory phasicity and response to augmentation. Popliteal Vein: No evidence of thrombus. Normal compressibility, respiratory phasicity and response to augmentation. Calf Veins: No evidence of thrombus. Normal compressibility and flow on color Doppler imaging. Superficial Great Saphenous Vein: No evidence of thrombus. Normal compressibility. Other Findings:  Peripheral vascular atherosclerosis. IMPRESSION: Negative for deep venous thrombosis within both legs. Electronically Signed   By: Billy Myers M.D.   On: 02/24/2024 17:14   Medications: I have reviewed the patient's current medications. Scheduled Meds:  acetaminophen   1,000 mg Oral Q6H   allopurinol  100 mg Oral Daily   arformoterol  15 mcg Nebulization BID   And   budesonide (PULMICORT) nebulizer solution  0.25 mg Nebulization BID   ascorbic acid  500 mg Oral Daily   bisacodyl  10 mg Oral QHS   buPROPion   300 mg Oral Daily   calcium  carbonate  1 tablet Oral TID WC   Chlorhexidine Gluconate Cloth  6 each Topical Daily   cyanocobalamin  1,000 mcg Intramuscular Daily   Followed by    NOREEN ON 03/02/2024] vitamin B-12  1,000 mcg Oral Daily   docusate sodium  100 mg Oral BID   iron polysaccharides  150 mg Oral Daily   isosorbide  mononitrate  30 mg Oral QPM   levothyroxine  25 mcg Oral Q0600   methylPREDNISolone (SOLU-MEDROL) injection  40 mg Intravenous Q24H   pantoprazole  (PROTONIX ) IV  40 mg Intravenous Q8H   Followed by   NOREEN ON 02/27/2024] pantoprazole  (PROTONIX ) IV  40 mg Intravenous Q12H   polyethylene glycol  17 g Oral BID   rosuvastatin   20 mg Oral QHS   sodium chloride  flush  3 mL Intravenous Q12H   Continuous Infusions:  piperacillin-tazobactam (ZOSYN)  IV 3.375 g (02/26/24 0935)   promethazine (PHENERGAN) injection (IM or IVPB) Stopped (02/23/24 2208)   PRN Meds:.ALPRAZolam , alum & mag hydroxide-simeth, bisacodyl, colchicine , iohexol , ipratropium-albuterol, magnesium  citrate, menthol **OR** phenol, naLOXone (NARCAN)  injection, nitroGLYCERIN , ondansetron  **OR** ondansetron  (ZOFRAN ) IV, promethazine (PHENERGAN) injection (IM or IVPB), simethicone , sodium chloride  flush, sodium chloride  flush, sorbitol   Assessment: Principal Problem:   Cervical myelopathy (HCC) Active Problems:   CAD with hx of CABG   Cervical spinal stenosis  Acute metabolic encephalopathy   Acute respiratory failure with hypoxia (HCC)   Nausea and vomiting   Coffee ground emesis   Hematemesis without nausea   Coronary artery disease of native artery of native heart with stable angina pectoris   Elevated troponin    Plan: The patient's hemoglobin is stable and due to his respiratory status and ongoing chest pain any GI intervention at this time will be deferred. If the patient should have any acute GI signs of bleeding then we may need to do something sooner than later but at this point with chest pain and elevated troponin I would hold off on endoluminal evaluation at this time.  Dr. Maryruth will be covering this weekend if anything should arise.   LOS: 4 days    Billy Copping, MD.FACG 02/26/2024, 10:15 AM Pager [phone] 7am-5pm  Check AMION for 5pm -7am coverage and on weekends

## 2024-02-26 NOTE — Progress Notes (Signed)
 Physical Therapy Treatment Patient Details Name: Billy Cisneros. MRN: [id number] DOB: [birth year] Today's Date: 02/26/2024   History of Present Illness Billy Cisneros. Is s/p C3-7 laminoplasty. Rapid response called 10/14 and pt transferred to ICU wit hencephalopathy, symptomatic hypotension with associated hematemesis and hypovolemic shock exacerbated by polypharmacy and demand ischemia    PT Comments  Pt was seated in recliner with supportive spouse present. He is A and O and agreeable to session. Pt demonstrated safe abilities to stand form recliner and tolerate ambulation ~ 120 ft. Vitals remained stable throughout ambulation. Discussed post acute care needs. Both pt/spouse feel Dcing home with HHPT to follow is best. Pt will benefit form continues skilled PT to maximize independence and safety with all ADLs.     If plan is discharge home, recommend the following: A little help with walking and/or transfers;A little help with bathing/dressing/bathroom     Equipment Recommendations  Rolling walker (2 wheels)       Precautions / Restrictions Precautions Precautions: Cervical Precaution Booklet Issued: Yes (comment) Recall of Precautions/Restrictions: Intact Restrictions Weight Bearing Restrictions Per Provider Order: No     Mobility     Transfers Overall transfer level: Needs assistance Equipment used: Rolling walker (2 wheels) Transfers: Sit to/from Stand Sit to Stand: Contact guard assist  General transfer comment: CGA to stand for safety however no physical lifting assistance    Ambulation/Gait Ambulation/Gait assistance: Supervision Gait Distance (Feet): 120 Feet Assistive device: Rolling walker (2 wheels) Gait Pattern/deviations: Step-through pattern Gait velocity: decreased  General Gait Details: no LOB or safety concerns during ambulation 120 ft with RW. vitals remained stable throughout.    Balance Overall balance assessment: Needs  assistance Sitting-balance support: Feet supported Sitting balance-Leahy Scale: Good     Standing balance support: Bilateral upper extremity supported, During functional activity, Reliant on assistive device for balance Standing balance-Leahy Scale: Fair Standing balance comment: no LOB with use of RW. pt educated on importance of continued use of RW at DC       Communication Communication Communication: Impaired Factors Affecting Communication: Hearing impaired  Cognition Arousal: Alert Behavior During Therapy: WFL for tasks assessed/performed   PT - Cognitive impairments: No apparent impairments    PT - Cognition Comments: Pt A and O x 4 Following commands: Intact      Cueing Cueing Techniques: Verbal cues     General Comments General comments (skin integrity, edema, etc.): vss on 2 L via [place]      Pertinent Vitals/Pain Pain Assessment Pain Assessment: 0-10 Pain Score: 3  Pain Location: neck and bilateral shoulders Pain Descriptors / Indicators: Dull, Aching, Sore, Discomfort Pain Intervention(s): Limited activity within patient's tolerance, Monitored during session, Premedicated before session, Repositioned     PT Goals (current goals can now be found in the care plan section) Acute Rehab PT Goals Patient Stated Goal: golf again Additional Goals Additional Goal #1: Indep recall and application of cervical precautions with all functional tasks for optimal safety/indep. Progress towards PT goals: Progressing toward goals    Frequency    Min 3X/week       AM-PAC PT 6 Clicks Mobility   Outcome Measure  Help needed turning from your back to your side while in a flat bed without using bedrails?: A Little Help needed moving from lying on your back to sitting on the side of a flat bed without using bedrails?: A Little Help needed moving to and from a bed to a chair (including a wheelchair)?: A  Little Help needed standing up from a chair using your arms (e.g.,  wheelchair or bedside chair)?: A Little Help needed to walk in hospital room?: A Little Help needed climbing 3-5 steps with a railing? : A Little 6 Click Score: 18    End of Session   Activity Tolerance: Patient tolerated treatment well Patient left: in chair;with call bell/phone within reach;with family/visitor present Nurse Communication: Mobility status PT Visit Diagnosis: Unsteadiness on feet (R26.81);Muscle weakness (generalized) (M62.81)     Time: 1510-1531 PT Time Calculation (min) (ACUTE ONLY): 21 min  Charges:    $Gait Training: 8-22 mins PT General Charges $$ ACUTE PT VISIT: 1 Visit                    Rankin Essex PTA 02/26/24, 4:58 PM

## 2024-02-26 NOTE — Progress Notes (Signed)
 Neurosurgery Progress Note  History: Jaysin Gayler. Is s/p C3-7 laminoplasty. He is currently 4 Days Post-Op  POD4: States that his neck and hands are doing very well.  POD3: Pt had chest pain and arm pain overnight prompting cardiac work up which showed elevation in his troponin and worsening anemia. He was given blood  POD2: Pt experienced confusion, rigors, nausea, and vomiting overnight. He was transferred to ICU and workup is underway for cause.  POD1: Pt experiencing urinary retention this morning requiring I&O cath. Reported expected pain in his shoulders but is otherwise doing well.   Physical Exam: Vitals:   02/26/24 0300 02/26/24 0400  BP: 117/62 126/70  Pulse: 98 (!) 102  Resp: (!) 22 (!) 24  Temp:    SpO2: 93% 90%    Awake and oriented.   Side Biceps Triceps Deltoid Interossei Grip Wrist Ext. Wrist Flex.  R 5 4+ 5 5 4+ 5 5  L 5 4+ 5 5 4+ 5 5     Data:  Other tests/results:  Lab Results  Component Value Date   CREATININE 1.54 (H) 02/25/2024   CREATININE 1.47 (H) 02/25/2024   CREATININE 1.55 (H) 02/24/2024      Latest Ref Rng & Units 02/26/2024   12:20 AM 02/25/2024    6:23 PM 02/25/2024    2:04 PM  CBC  Hemoglobin 13.0 - 17.0 g/dL 8.5  8.4  9.4       Latest Ref Rng & Units 02/25/2024    2:04 PM 02/25/2024    2:06 AM 02/24/2024   11:41 PM  CMP  Glucose 70 - 99 mg/dL 857  839  836   BUN 8 - 23 mg/dL 26  26  27    Creatinine 0.61 - 1.24 mg/dL 8.45  8.52  8.44   Sodium 135 - 145 mmol/L 143  140  140   Potassium 3.5 - 5.1 mmol/L 3.9  4.3  4.4   Chloride 98 - 111 mmol/L 109  112  110   CO2 22 - 32 mmol/L 22  20  20    Calcium  8.9 - 10.3 mg/dL 7.9  7.4  7.7      Assessment/Plan:  [name]. Is a 73 y.o presenting with cervical stenosis and myelopathy s/p laminoplasty.  Pt suffered chest pain overnight requiring nitroglycerin  and a unit of blood.   - CT showing concern for aspiration pneumonia. Started on Unasyn  - Pain well  controlled - DVT prophylaxis - HV removed on 10/14.  - PTOT; dispo planning underway  - remainder of care per medicine team. We are very appreciative of your assistance  Penne LELON Sharps MD Department of Neurosurgery

## 2024-02-27 DIAGNOSIS — K920 Hematemesis: Secondary | ICD-10-CM

## 2024-02-27 DIAGNOSIS — I2489 Other forms of acute ischemic heart disease: Secondary | ICD-10-CM

## 2024-02-27 DIAGNOSIS — Z951 Presence of aortocoronary bypass graft: Secondary | ICD-10-CM | POA: Diagnosis not present

## 2024-02-27 DIAGNOSIS — G959 Disease of spinal cord, unspecified: Secondary | ICD-10-CM | POA: Diagnosis not present

## 2024-02-27 LAB — CBC
HCT: 25.8 % — ABNORMAL LOW (ref 39.0–52.0)
Hemoglobin: 8.7 g/dL — ABNORMAL LOW (ref 13.0–17.0)
MCH: 30.9 pg (ref 26.0–34.0)
MCHC: 33.7 g/dL (ref 30.0–36.0)
MCV: 91.5 fL (ref 80.0–100.0)
Platelets: 231 K/uL (ref 150–400)
RBC: 2.82 MIL/uL — ABNORMAL LOW (ref 4.22–5.81)
RDW: 14.1 % (ref 11.5–15.5)
WBC: 11.1 K/uL — ABNORMAL HIGH (ref 4.0–10.5)
nRBC: 0.4 % — ABNORMAL HIGH (ref 0.0–0.2)

## 2024-02-27 LAB — BLOOD GAS, VENOUS
Acid-base deficit: 4.6 mmol/L — ABNORMAL HIGH (ref 0.0–2.0)
Bicarbonate: 22.1 mmol/L (ref 20.0–28.0)
Patient temperature: 37
pCO2, Ven: 46 mmHg (ref 44–60)
pH, Ven: 7.29 (ref 7.25–7.43)

## 2024-02-27 LAB — BASIC METABOLIC PANEL WITH GFR
Anion gap: 14 (ref 5–15)
BUN: 28 mg/dL — ABNORMAL HIGH (ref 8–23)
CO2: 21 mmol/L — ABNORMAL LOW (ref 22–32)
Calcium: 8.1 mg/dL — ABNORMAL LOW (ref 8.9–10.3)
Chloride: 109 mmol/L (ref 98–111)
Creatinine, Ser: 1.43 mg/dL — ABNORMAL HIGH (ref 0.61–1.24)
GFR, Estimated: 52 mL/min — ABNORMAL LOW (ref >60)
Glucose, Bld: 107 mg/dL — ABNORMAL HIGH (ref 70–99)
Potassium: 4 mmol/L (ref 3.5–5.1)
Sodium: 144 mmol/L (ref 135–145)

## 2024-02-27 LAB — MAGNESIUM: Magnesium: 2.5 mg/dL — ABNORMAL HIGH (ref 1.7–2.4)

## 2024-02-27 LAB — BRAIN NATRIURETIC PEPTIDE: B Natriuretic Peptide: 232.2 pg/mL — ABNORMAL HIGH (ref 0.0–100.0)

## 2024-02-27 LAB — HEMOGLOBIN
Hemoglobin: 10 g/dL — ABNORMAL LOW (ref 13.0–17.0)
Hemoglobin: 9.1 g/dL — ABNORMAL LOW (ref 13.0–17.0)

## 2024-02-27 LAB — PHOSPHORUS: Phosphorus: 3.1 mg/dL (ref 2.5–4.6)

## 2024-02-27 MED ORDER — POLYSACCHARIDE IRON COMPLEX 150 MG PO CAPS: 150.0000 mg | ORAL_CAPSULE | Freq: Every day | ORAL | 3 refills | Status: AC

## 2024-02-27 MED ORDER — ASCORBIC ACID 500 MG PO TABS: 500.0000 mg | ORAL_TABLET | Freq: Every day | ORAL | 0 refills | Status: AC

## 2024-02-27 MED ORDER — CYANOCOBALAMIN 1000 MCG PO TABS: 1000.0000 ug | ORAL_TABLET | Freq: Every day | ORAL | 0 refills | Status: DC

## 2024-02-27 MED ORDER — ISOSORBIDE MONONITRATE ER 60 MG PO TB24: 30.0000 mg | ORAL_TABLET | Freq: Every day | ORAL | 1 refills | Status: DC

## 2024-02-27 MED ORDER — PANTOPRAZOLE SODIUM 40 MG PO TBEC: 40.0000 mg | DELAYED_RELEASE_TABLET | Freq: Every day | ORAL | 3 refills | Status: AC

## 2024-02-27 MED ORDER — BISACODYL 5 MG PO TBEC: 10.0000 mg | DELAYED_RELEASE_TABLET | Freq: Every day | ORAL | 0 refills | Status: DC

## 2024-02-27 MED ORDER — AMOXICILLIN-POT CLAVULANATE 875-125 MG PO TABS: 1.0000 | ORAL_TABLET | Freq: Two times a day (BID) | ORAL | 0 refills | Status: AC

## 2024-02-27 MED ADMIN — Zolpidem Tartrate Tab 5 MG: 5 mg | ORAL | NDC 00904608261

## 2024-02-27 NOTE — Progress Notes (Signed)
 Pt ambulated around perimeter triangle of ICU. Started with O2 at 2LPM with sats 96%. O2 removed and sats remained 96%, except brief drop to 92% and then back to 96%. Back to room to sit in chair. O2 off with sats 96% on room air. No symptoms of SOB or weakness.

## 2024-02-27 NOTE — Progress Notes (Signed)
 Progress Note  Patient Name: Billy Cisneros. Date of Encounter: 02/27/2024  Primary Cardiologist: Hilty  Subjective   Chest pain resolved following reinitiation of isosorbide .  Hemoglobin trended from 7.7-9.8 on 10/16 following PRBC with with subsequent readings ranging from 9.6 to 8.7.  He denies any melena, hematochezia, hemoptysis, or further hematemesis.  Hemodynamically stable.  Ambulated yesterday without chest pain.   Inpatient Medications    Scheduled Meds:  acetaminophen   1,000 mg Oral Q6H   allopurinol  100 mg Oral Daily   arformoterol  15 mcg Nebulization BID   And   budesonide (PULMICORT) nebulizer solution  0.25 mg Nebulization BID   ascorbic acid  500 mg Oral Daily   bisacodyl  10 mg Oral QHS   buPROPion   300 mg Oral Daily   calcium  carbonate  1 tablet Oral TID WC   Chlorhexidine Gluconate Cloth  6 each Topical Daily   cyanocobalamin  1,000 mcg Intramuscular Daily   Followed by   NOREEN ON 03/02/2024] vitamin B-12  1,000 mcg Oral Daily   docusate sodium  100 mg Oral BID   iron polysaccharides  150 mg Oral Daily   isosorbide  mononitrate  30 mg Oral QPM   levothyroxine  25 mcg Oral Q0600   methylPREDNISolone (SOLU-MEDROL) injection  40 mg Intravenous Q24H   pantoprazole  (PROTONIX ) IV  40 mg Intravenous Q8H   Followed by   pantoprazole  (PROTONIX ) IV  40 mg Intravenous Q12H   polyethylene glycol  17 g Oral BID   rosuvastatin   20 mg Oral QHS   sodium chloride  flush  3 mL Intravenous Q12H   Continuous Infusions:  piperacillin-tazobactam (ZOSYN)  IV 3.375 g (02/27/24 0225)   promethazine (PHENERGAN) injection (IM or IVPB) Stopped (02/23/24 2208)   PRN Meds: ALPRAZolam , alum & mag hydroxide-simeth, bisacodyl, colchicine , iohexol , ipratropium-albuterol, magnesium  citrate, menthol **OR** phenol, naLOXone (NARCAN)  injection, nitroGLYCERIN , ondansetron  **OR** ondansetron  (ZOFRAN ) IV, promethazine (PHENERGAN) injection (IM or IVPB), simethicone , sodium chloride   flush, sodium chloride  flush, sorbitol, zolpidem    Vital Signs    Vitals:   02/27/24 0200 02/27/24 0300 02/27/24 0400 02/27/24 0640  BP: 134/85 (!) 144/83  130/73  Pulse: 85 84  87  Resp: (!) 21 17  19   Temp:   98 F (36.7 C) 98.5 F (36.9 C)  TempSrc:   Oral Oral  SpO2: 95% 98%  94%  Weight:      Height:        Intake/Output Summary (Last 24 hours) at 02/27/2024 0724 Last data filed at 02/27/2024 0700 Gross per 24 hour  Intake 248 ml  Output 1475 ml  Net -1227 ml   Filed Weights   02/24/24 0721  Weight: 82.3 kg    Telemetry    Sinus rhythm with rates in the 80s to 90s bpm - Personally Reviewed  ECG    No new tracings - Personally Reviewed  Physical Exam   GEN: No acute distress.   Neck: No JVD. Cardiac: RRR, no murmurs, rubs, or gallops.  Respiratory: Clear to auscultation bilaterally.  GI: Soft, nontender, non-distended.   MS: No edema; No deformity. Neuro:  Alert and oriented x 3; Nonfocal.  Psych: Normal affect.  Labs    Chemistry Recent Labs  Lab 02/23/24 1847 02/24/24 0612 02/24/24 2341 02/25/24 1404 02/26/24 0554 02/27/24 0609  NA 136 136   < > 143 141 144  K 4.7 4.6   < > 3.9 4.3 4.0  CL 99 104   < > 109 111  109  CO2 25 21*   < > 22 23 21*  GLUCOSE 163* 121*   < > 142* 142* 107*  BUN 42* 36*   < > 26* 24* 28*  CREATININE 1.82* 1.52*   < > 1.54* 1.55* 1.43*  CALCIUM  8.5* 8.0*   < > 7.9* 7.8* 8.1*  PROT 6.0* 5.6*  --   --   --   --   ALBUMIN 3.4* 3.3*  --   --   --   --   AST 40 34  --   --   --   --   ALT 33 26  --   --   --   --   ALKPHOS 48 38  --   --   --   --   BILITOT 0.8 0.7  --   --   --   --   GFRNONAA 39* 49*   < > 48* 48* 52*  ANIONGAP 12 11   < > 12 7 14    < > = values in this interval not displayed.     Hematology Recent Labs  Lab 02/25/24 0206 02/25/24 1026 02/25/24 1404 02/26/24 0554 02/26/24 1205 02/26/24 2024 02/26/24 2339 02/27/24 0609  WBC 9.2  --   --  10.9*  --   --   --  11.1*  RBC 2.41*  --   --   2.71*  --   --   --  2.82*  HGB 7.7* 9.8*   < > 8.5*   < > 9.6* 9.1* 8.7*  HCT 22.1* 29.4*  --  24.7*  --   --   --  25.8*  MCV 91.7  --   --  91.1  --   --   --  91.5  MCH 32.0  --   --  31.4  --   --   --  30.9  MCHC 34.8  --   --  34.4  --   --   --  33.7  RDW 13.5  --   --  14.2  --   --   --  14.1  PLT 152  --   --  202  --   --   --  231   < > = values in this interval not displayed.    Cardiac EnzymesNo results for input(s): TROPONINI in the last 168 hours. No results for input(s): TROPIPOC in the last 168 hours.   BNP Recent Labs  Lab 02/24/24 0612  BNP 24.5     DDimer No results for input(s): DDIMER in the last 168 hours.   Radiology    US  Venous Img Lower Bilateral (DVT) Result Date: 02/24/2024 IMPRESSION: Negative for deep venous thrombosis within both legs. Electronically Signed   By: Rogelia Myers M.D.   On: 02/24/2024 17:14   DG Chest Port 1 View Result Date: 02/24/2024 IMPRESSION: Low lung volumes.  Otherwise, no acute cardiopulmonary abnormality. Electronically Signed   By: Rogelia Myers M.D.   On: 02/24/2024 09:23    Cardiac Studies   2D echo 02/02/2024: 1. Left ventricular ejection fraction, by estimation, is 55 to 60%. The  left ventricle has normal function. The left ventricle has no regional  wall motion abnormalities. Left ventricular diastolic parameters are  indeterminate.   2. Right ventricular systolic function is normal. The right ventricular  size is normal. Tricuspid regurgitation signal is inadequate for assessing  PA pressure.   3. The mitral valve is normal in structure.  Trivial mitral valve  regurgitation. No evidence of mitral stenosis.   4. The aortic valve is normal in structure. Aortic valve regurgitation is  not visualized. No aortic stenosis is present.   5. The inferior vena cava is normal in size with greater than 50%  respiratory variability, suggesting right atrial pressure of 3 mmHg.  __________  Myocardial PET/CT  02/11/2024:   LV perfusion is abnormal. Defect 1: There is a medium defect with moderate to severe reduction in uptake present in the apical to mid anterior and anterolateral location(s) that is partially reversible. There is abnormal wall motion in the defect area. Consistent with infarction and peri-infarct ischemia.   Rest left ventricular function is normal. Rest EF: 67%. Stress left ventricular function is normal. Stress EF: 68%. End diastolic cavity size is normal.   Myocardial blood flow reserve is not reported in this patient due to technical or patient-specific concerns that affect accuracy.   Coronary calcium  assessment not performed due to prior revascularization.   Findings are consistent with infarction with peri-infarct ischemia. The study is intermediate risk.  Patient Profile     73 y.o. male with history of CAD status post 5 vessel CABG in 2000 with LIMA to LAD, RIMA to RCA, SVG to OM1, diagonal and ramus with patent grafts by [facility] in 09/2022, HTN, GERD, and gout who underwent cervical laminoplasty on 10/13 with admission complicated by encephalopathy, symptomatic hypotension with associated hematemesis and hypovolemic shock exacerbated by polypharmacy and demand ischemia who we are seeing for chest pain in the setting of hemoglobin trend from 13.7 down to 7.7.  Assessment & Plan    1.  CAD status post CABG with precordial pain and elevated high-sensitivity troponin: - Suspect chest pain is in the setting of underlying native vessel CAD with symptomatic anemia and elevated troponin is related to demand ischemia in the setting of the above - Patient is not a candidate for invasive ischemic evaluation with symptomatic transfusion dependent anemia - Chest pain is resolved following reinitiation of Imdur  with recommendation to titrate as indicated for antianginal effect - No indication for heparin  drip with symptomatic, transfusion dependent anemia, and with minimal troponin elevation not  consistent with ACS - No plans for inpatient ischemic testing at this time  - Resume aspirin  when able per GI/internal medicine - PTA rosuvastatin   2.  Hematemesis with symptomatic anemia: - Hemoglobin trending from 13.7 down to 7.7 status post PRBC with hemoglobin trending from 9.8 down to 8.5 following transfusion status post with subsequent hemoglobin trend ranging from 9.6 to 8.7 - Denies active bleeding - Management per internal medicine      For questions or updates, please contact [facility] Please consult [email or web address] for contact info under Cardiology/STEMI.    Signed, Bernardino Bring, PA-C [facility] Pager: [phone] 02/27/2024, 7:24 AM

## 2024-02-27 NOTE — Progress Notes (Signed)
 Neurosurgery visit note Patient seen and examined.  Overall he is doing well denies any headaches denies any upper or lower extremity pain or numbness.  He notes his hands continue to be improved his hemoglobin and hematocrit have stabilized and appreciate the excellent care of the ICU hospitalist service as well as cardiology team.  Currently denies any chest pain shortness of breath.  Continues to have a Foley in place. On physical exam his incision is clean dry and intact his upper extremities are grossly full strength to squeeze and his sensation is grossly full  AP: Overall is doing extremely well from a neurosurgery standpoint.  Okay from my perspective to discharge home should he be ready once he has had his Foley removed and he is able to ambulate tolerate p.o.'s and is urinating as well as if hospitalist service and cardiology cleared him.  Appreciate their excellent care.  We will arrange follow-up  Billy Cisneros. Deatrice, MD Neurosurgery

## 2024-02-27 NOTE — Progress Notes (Signed)
 Verbal and written discharge instructions provided to patient and wife; demonstrated foley care and provided spare leg bag and standard drainage bag to patient. PIV and midline IV removed; no bleeding or other problems noted. Given information regarding all follow up care and also given information about signs and symptoms of UTI and other complications of indwelling urinary catheter at home. Pt also given written instructions from PT regarding restrictions and precautions related to cervical surgery. Pt and wife verbalized understanding of all information provided; asked appropriate questions. Pt transported via [facility] and pt was assisted into personal vehicle driven by wife without complications.

## 2024-02-27 NOTE — Plan of Care (Signed)

## 2024-02-27 NOTE — Progress Notes (Signed)
 Occupational Therapy Treatment Patient Details Name: Rudolfo Brandow. MRN: [id number] DOB: May 07, 1951 Today's Date: 02/27/2024   History of present illness [name]. Is s/p C3-7 laminoplasty. Rapid response called 10/14 and pt transferred to ICU wit hencephalopathy, symptomatic hypotension with associated hematemesis and hypovolemic shock exacerbated by polypharmacy and demand ischemia   OT comments  Pt is seated in recliner on arrival. Plesant and agreeable to OT session. He denies pain. Declines further mobility this session as he recently ambulated the ICU unit with nursing staff and was seen by OT utilizing RW with SBA. He is now on RA with stable vitals. Pt was educated on cervical precautions and helpful hints during ADL performance, e.g. oral care, LB dressing/bathing, showering/washing hair, pet care (has a cat) IADLs, e.g. reaching in the fridgeor cabinets for something, use of AE/AD/DME to maximize safety and IND on DC. He verbalized understanding of all education provided and is greatly appreciative. Handout provided to maximize carryover. Pt left seated in recliner with all needs in place and will cont to require skilled acute OT services to maximize his safety and IND to return to PLOF.       If plan is discharge home, recommend the following:  Assistance with cooking/housework;Assist for transportation;Help with stairs or ramp for entrance;A little help with walking and/or transfers;A little help with bathing/dressing/bathroom   Equipment Recommendations  Tub/shower seat;BSC/3in1    Recommendations for Other Services      Precautions / Restrictions Precautions Precautions: Cervical Precaution Booklet Issued: Yes (comment) Recall of Precautions/Restrictions: Intact Restrictions Weight Bearing Restrictions Per Provider Order: No       Mobility Bed Mobility               General bed mobility comments: NT up in recliner pre/post session; able to  reposition self in recliner; declined additional mobility after walking with nursing prior to OT visit    Transfers                   General transfer comment: seen ambulating in the ICU with nursing with RW use and SBA     Balance Overall balance assessment: Needs assistance Sitting-balance support: Feet supported Sitting balance-Leahy Scale: Good     Standing balance support: Bilateral upper extremity supported, During functional activity, Reliant on assistive device for balance Standing balance-Leahy Scale: Good Standing balance comment: RW use and SBA                           ADL either performed or assessed with clinical judgement   ADL                                         General ADL Comments: educated on cervical precautions and helpful hints during ADL performance, e.g. oral care, LB dressing/bathing, showering/washing hair, IADLs, e.g. reaching in the fridgeor cabinets for something    Extremity/Trunk Assessment              Vision       Perception     Praxis     Communication Communication Communication: Impaired Factors Affecting Communication: Hearing impaired   Cognition Arousal: Alert Behavior During Therapy: [facility] for tasks assessed/performed  Following commands: Intact        Cueing   Cueing Techniques: Verbal cues  Exercises Other Exercises Other Exercises: educated on cervical precautions and helpful hints during ADL performance, e.g. oral care, LB dressing/bathing, showering/washing hair, pet care (has a cat) IADLs, e.g. reaching in the fridgeor cabinets for something, use of AE/AD/DME to maximize safety and IND on DC. He verbalized understanding of all education provided and is greatly appreciative. Handout provided to maximize carryover.    Shoulder Instructions       General Comments      Pertinent Vitals/ Pain       Pain Assessment Pain  Assessment: No/denies pain Pain Intervention(s): Monitored during session  Home Living                                          Prior Functioning/Environment              Frequency  Min 2X/week        Progress Toward Goals  OT Goals(current goals can now be found in the care plan section)  Progress towards OT goals: Progressing toward goals  Acute Rehab OT Goals Patient Stated Goal: go home OT Goal Formulation: With patient Time For Goal Achievement: 03/08/24 Potential to Achieve Goals: Fair  Plan      Co-evaluation                 AM-PAC OT 6 Clicks Daily Activity     Outcome Measure   Help from another person eating meals?: None Help from another person taking care of personal grooming?: None Help from another person toileting, which includes using toliet, bedpan, or urinal?: A Little Help from another person bathing (including washing, rinsing, drying)?: A Little Help from another person to put on and taking off regular upper body clothing?: A Little Help from another person to put on and taking off regular lower body clothing?: A Little 6 Click Score: 20    End of Session Equipment Utilized During Treatment: Rolling walker (2 wheels)  OT Visit Diagnosis: Unsteadiness on feet (R26.81);Repeated falls (R29.6);Muscle weakness (generalized) (M62.81)   Activity Tolerance Patient tolerated treatment well   Patient Left in chair;with call bell/phone within reach;with nursing/sitter in room   Nurse Communication Mobility status        Time: 8365-8345 OT Time Calculation (min): 20 min  Charges: OT General Charges $OT Visit: 1 Visit OT Treatments $Self Care/Home Management : 8-22 mins  Moss Berry, OTR/L  02/27/24, 5:11 PM   Georga Stys E [place] 02/27/2024, 5:10 PM

## 2024-02-27 NOTE — Discharge Summary (Signed)
 Physician Discharge Summary   Patient: Billy Cisneros. MRN: [id number] DOB: [birth year]  Admit date:     02/22/2024  Discharge date: 02/27/24  Discharge Physician: Drue ONEIDA Potter   PCP: [name], MD   Recommendations at discharge:  Follow-up with PCP, gastroenterology, cardiology as well as urology  Discharge Diagnoses: Principal Problem:   Cervical myelopathy (HCC) Active Problems:   Acute metabolic encephalopathy   Acute respiratory failure with hypoxia (HCC)   Nausea and vomiting   CAD with hx of CABG   Cervical spinal stenosis   Coffee ground emesis   Hematemesis without nausea   Coronary artery disease of native artery of native heart with stable angina pectoris   Elevated troponin   Demand ischemia (HCC)  Resolved Problems:   * No resolved hospital problems. *  Hospital Course:   Billy Cisneros. is a 73 y.o. male with past medical history of CAD s/p CABG x 5, HTN, spinal stenosis s/p laminectomy 10/13 (prior cardiac clearance on 10/6) being seen in urgent consultation during a rapid response.  According to bedside nurse, patient had normal mentation at 4 AM however couple hours later he was difficult to arouse and had slurred speech.  Vitals revealed hypotension and hypoxia, all prompting the rapid response.  Nurse also reports that the day prior patient had an episode of coffee-ground emesis and did appear a bit confused during the afternoon hours.   Of note, patient did receive several meds over the course of the past 24 hours related to vomiting/insomnia/anxiety depression including Phenergan, Ambien , methocarbamol  and alprazolam  at 2148 on 10/14, and hydromorphone 0.5 mg at 2041.   10/15 replaced which was called today early morning and [facility] was consulted for comanagement.   Other hospital course as outlined below   Assessment and Plan:   # Acute hypoxic respiratory failure secondary to aspiration pneumonia Continue supplemental nutrition and  gradually wean Had nebulization We will complete a course of antibiotics     # Hypotension most likely secondary to sepsis Hypotension resolved and patient's antihypertensives resumed   # Aspiration pneumonia, as patient had an episode of coffee-ground emesis most likely due to altered mental status Started Unasyn Patient will complete a course of Augmentin as an outpatient    # Acute metabolic encephalopathy, multifactorial CT head negative for any acute findings Avoid sedating medications Reorient, continue fall precautions Continue supportive care 10/16 resolved, currently back to baseline   # Coffee-ground emesis, possible upper GI bleed Continue PPI Hb 104> 8.3>7.7 (1u prbc)> 9.8 Monitor [facility] GI consulted, Rec PPI and conservative management, not stable for scope at this time, will need EGD when stable Patient will follow-up with gastroenterologist, have been cleared by Dr. Maryruth for discharge today with outpatient follow-up   # Spinal stenosis, s/p laminectomy done on 10/3 Continue as needed pain meds Follow neurosurgery   # AKI and urinary retention 10/15 Foley catheter was inserted and 500 mL urine was collected Patient will follow-up with urologist with Foley catheter in place     # CAD s/p CABG, HTN, HLD 10/15 hypotensive possible due to sepsis 10/16 chest pain, mildly elevated troponin most likely due to demand ischemia secondary to anemia.  Patient received 1 unit of PRBC Case discussed with cardiologist patient has been cleared for discharge today     # Hypophosphatemia, due to nutritional deficiency.  Phos repleted. # Hypomagnesemia, mag repleted. Monitor electrolytes daily and replete as needed.   # Vitamin B12 deficiency: B12  level 207, goal >400.  Started vitamin B12 1000 mcg IM injection daily during hospital stay, followed by oral supplement.  Follow-up PCP to repeat vitamin B12 level after 3 to 6 months.   # Iron deficiency, start oral iron  supplement with vitamin C when stable.    # Constipation, started laxatives.   # Gout: Allopurinol # Depression: Continue Abilify and Wellbutrin  # Hypothyroid: Synthroid     Body mass index is 30.19 kg/m.  Interventions:  Consultants: As mentioned above Procedures performed: As mentioned above Disposition: Home health Diet recommendation:  Cardiac diet DISCHARGE MEDICATION: Allergies as of 02/27/2024   No Known Allergies      Medication List     STOP taking these medications    omeprazole  20 MG capsule Commonly known as: PRILOSEC   predniSONE  10 MG tablet Commonly known as: DELTASONE        TAKE these medications    allopurinol 100 MG tablet Commonly known as: ZYLOPRIM Take 100 mg by mouth daily.   ALPRAZolam  0.5 MG tablet Commonly known as: XANAX  TAKE 1 TABLET BY MOUTH 3 TIMES A DAY AS NEEDED FOR ANXIETY.   amoxicillin-clavulanate 875-125 MG tablet Commonly known as: AUGMENTIN Take 1 tablet by mouth 2 (two) times daily for 5 days.   ARIPiprazole 2 MG tablet Commonly known as: ABILIFY Take 2 mg by mouth at bedtime.   ascorbic acid 500 MG tablet Commonly known as: VITAMIN C Take 1 tablet (500 mg total) by mouth daily. Start taking on: February 28, 2024   aspirin  EC 81 MG tablet Take 1 tablet (81 mg total) by mouth daily. For heart attack prevention.   bisacodyl 5 MG EC tablet Commonly known as: DULCOLAX Take 2 tablets (10 mg total) by mouth at bedtime.   buPROPion  300 MG 24 hr tablet Commonly known as: WELLBUTRIN  XL Take 1 tablet (300 mg total) by mouth daily.   colchicine  0.6 MG tablet Take 0.6 mg by mouth daily as needed (gout).   cyanocobalamin 1000 MCG tablet Take 1 tablet (1,000 mcg total) by mouth daily. Start taking on: March 02, 2024   iron polysaccharides 150 MG capsule Commonly known as: NIFEREX Take 1 capsule (150 mg total) by mouth daily. Start taking on: February 28, 2024   isosorbide  mononitrate 60 MG 24 hr  tablet Commonly known as: IMDUR  Take 0.5 tablets (30 mg total) by mouth daily. What changed: how much to take   levothyroxine 25 MCG tablet Commonly known as: SYNTHROID Take 25 mcg by mouth daily before breakfast.   lisinopril  10 MG tablet Commonly known as: ZESTRIL  Take 1 tablet (10 mg total) by mouth daily. What changed: when to take this   metoprolol  succinate 25 MG 24 hr tablet Commonly known as: Toprol  XL Take 1 tablet (25 mg total) by mouth daily. What changed: when to take this   multivitamin with minerals tablet Take 1 tablet by mouth at bedtime.   nitroGLYCERIN  0.4 MG SL tablet Commonly known as: NITROSTAT  Place 1 tablet (0.4 mg total) under the tongue every 5 (five) minutes as needed for chest pain.   omega-3 acid ethyl esters 1 g capsule Commonly known as: LOVAZA  TAKE ONE CAPSULE BY MOUTH TWICE A DAY   pantoprazole  40 MG tablet Commonly known as: Protonix  Take 1 tablet (40 mg total) by mouth daily.   rosuvastatin  20 MG tablet Commonly known as: CRESTOR  Take 1 tablet (20 mg total) by mouth at bedtime.   valACYclovir  500 MG tablet Commonly known as: VALTREX  Take  500 mg by mouth 2 (two) times daily as needed (outbreak).   zolpidem  5 MG tablet Commonly known as: AMBIEN  Take 5 mg by mouth at bedtime as needed.        Contact information for follow-up providers     Stoioff, Glendia BROCKS, MD. Call.   Specialty: Urology Contact information: [address] [place] [address] [phone]              Contact information for after-discharge care     Home Medical Care     [facility] -  .   Service: Home Health Services Contact information: [phone] [place] [place]  [address] [phone]                    Discharge Exam: Fredricka Weights   02/24/24 0721  Weight: 82.3 kg   General: Respiratory distress, alert and oriented x 3 Eyes: PERRLA ENT: Oral Mucosa Clear, moist  Neck: no JVD,   Cardiovascular: S1 and S2 Present, no Murmur,  Respiratory: Creased respiratory rate, short of breath, bilateral crackles, mild wheezes Abdomen: BS present, Soft and no tenderness,  Skin: no rashes Extremities: no Pedal edema, no calf tenderness Neurologic: Alert and oriented x 3  Condition at discharge: good  The results of significant diagnostics from this hospitalization (including imaging, microbiology, ancillary and laboratory) are listed below for reference.   Imaging Studies: US  Venous Img Lower Bilateral (DVT) Result Date: 02/24/2024 CLINICAL DATA:  9965 Edema 9965 EXAM: BILATERAL LOWER EXTREMITY VENOUS DOPPLER ULTRASOUND TECHNIQUE: Gray-scale sonography with graded compression, as well as color Doppler and duplex ultrasound were performed to evaluate the lower extremity deep venous systems from the level of the common femoral vein and including the common femoral, femoral, profunda femoral, popliteal and calf veins including the posterior tibial, peroneal and gastrocnemius veins when visible. The superficial great saphenous vein was also interrogated. Spectral Doppler was utilized to evaluate flow at rest and with distal augmentation maneuvers in the common femoral, femoral and popliteal veins. COMPARISON:  None Available. FINDINGS: RIGHT LOWER EXTREMITY Common Femoral Vein: No evidence of thrombus. Normal compressibility, respiratory phasicity and response to augmentation. Saphenofemoral Junction: No evidence of thrombus. Normal compressibility and flow on color Doppler imaging. Profunda Femoral Vein: No evidence of thrombus. Normal compressibility and flow on color Doppler imaging. Femoral Vein: No evidence of thrombus. Normal compressibility, respiratory phasicity and response to augmentation. Popliteal Vein: No evidence of thrombus. Normal compressibility, respiratory phasicity and response to augmentation. Calf Veins: No evidence of thrombus. Normal compressibility and flow on color  Doppler imaging. Superficial Great Saphenous Vein: No evidence of thrombus. Normal compressibility. Other Findings:  None. LEFT LOWER EXTREMITY Common Femoral Vein: No evidence of thrombus. Normal compressibility, respiratory phasicity and response to augmentation. Saphenofemoral Junction: No evidence of thrombus. Normal compressibility and flow on color Doppler imaging. Profunda Femoral Vein: No evidence of thrombus. Normal compressibility and flow on color Doppler imaging. Femoral Vein: No evidence of thrombus. Normal compressibility, respiratory phasicity and response to augmentation. Popliteal Vein: No evidence of thrombus. Normal compressibility, respiratory phasicity and response to augmentation. Calf Veins: No evidence of thrombus. Normal compressibility and flow on color Doppler imaging. Superficial Great Saphenous Vein: No evidence of thrombus. Normal compressibility. Other Findings:  Peripheral vascular atherosclerosis. IMPRESSION: Negative for deep venous thrombosis within both legs. Electronically Signed   By: Rogelia Myers M.D.   On: 02/24/2024 17:14   DG Chest Port 1 View Result Date: 02/24/2024 CLINICAL DATA:  [id number]  Pulmonary edema [id number] EXAM: PORTABLE CHEST - 1 VIEW COMPARISON:  02/24/2024 5:50 a.m. FINDINGS: Low lung volumes. Similar streaky atelectasis in the perihilar regions bilaterally. No new airspace consolidation, pleural effusion, or pneumothorax. Mild cardiomegaly. Sternotomy wires and CABG markers. Aortic atherosclerosis. No acute fracture or destructive lesions. Multilevel thoracic osteophytosis. IMPRESSION: Low lung volumes.  Otherwise, no acute cardiopulmonary abnormality. Electronically Signed   By: Rogelia Myers M.D.   On: 02/24/2024 09:23   CT HEAD WO CONTRAST ( ) Result Date: 02/24/2024 EXAM: CT HEAD WITHOUT CONTRAST 02/24/2024 07:11:12 AM TECHNIQUE: CT of the head was performed without the administration of intravenous contrast. Automated exposure control, iterative  reconstruction, and/or weight based adjustment of the mA/kV was utilized to reduce the radiation dose to as low as reasonably achievable. COMPARISON: Head CT dated 11/23/2017. CLINICAL HISTORY: Mental status change, unknown cause. FINDINGS: BRAIN AND VENTRICLES: Mild to moderate cerebral atrophy, small vessel disease, and atrophic ventriculomegaly are again noted. There is relatively minimal cerebellar volume loss. No acute hemorrhage. No cortical based acute infarct. No mass effect or midline shift. No extra-axial collection. The basal cisterns are clear. There are calcifications in the distal right vertebral artery, the basilar artery, and both siphons. No hyperdense vessels. ORBITS: No acute abnormality. SINUSES: Paranasal sinuses and left mastoid air cells are clear. Trace chronic fluid again noted at the right mastoid tip. SOFT TISSUES AND SKULL: There are dorsal midline skin staples partially visible in the upper neck as well as a small amount of subcutaneous soft tissue gas, presumably left over from cervical laminoplasty done 2 days ago. No focal skull lesions or skull fractures are seen. IMPRESSION: 1. No acute intracranial CT findings. Stable exam. Atrophy and small vessel changes. 2. Postoperative changes in the upper neck with dorsal midline skin staples and small volume subcutaneous gas related to recent cervical laminoplasty. Electronically signed by: Francis Quam MD 02/24/2024 07:39 AM EDT RP Workstation: [id number]   CT ABDOMEN PELVIS WO CONTRAST Result Date: 02/24/2024 EXAM: CT ABDOMEN AND PELVIS WITHOUT CONTRAST 02/24/2024 07:11:12 AM TECHNIQUE: CT of the abdomen and pelvis was performed without the administration of intravenous contrast. Multiplanar reformatted images are provided for review. Automated exposure control, iterative reconstruction, and/or weight-based adjustment of the mA/kV was utilized to reduce the radiation dose to as low as reasonably achievable. COMPARISON: CT abdomen  pelvis with iv contrast 02/16/2024. AP and lateral chest from 02/24/2024. CLINICAL HISTORY: Abdominal pain, acute, nonlocalized. FINDINGS: LOWER CHEST: Increased posterior atelectasis in both lower lobes. Increased bilateral lower lobe bronchial thickening and patchy hazy consolidation worrisome for bilateral pneumonia or aspiration. Subpleural linear scarring or atelectasis again noted in the lateral base of the right middle lobe with remaining lungs clear. CABG changes with native 3 vessel coronary calcifications and normal cardiac size. Moderate sized hiatal hernia. LIVER: The liver is mildly steatotic and otherwise unremarkable. GALLBLADDER AND BILE DUCTS: Gallbladder is unremarkable. No biliary ductal dilatation. SPLEEN: No acute abnormality. PANCREAS: No acute abnormality. ADRENAL GLANDS: No acute abnormality. KIDNEYS, URETERS AND BLADDER: No stones in the kidneys or ureters. No hydronephrosis. No perinephric or periureteral stranding. Urinary bladder is unremarkable. There is no contour deforming renal mass. GI AND BOWEL: There are retained food products in the proximal stomach and in the hiatal hernia. Normal caliber of the small bowel and appendix. Mild to moderate fecal stasis in the cecum and ascending colon. There are scattered descending and sigmoid diverticula but no evidence of diverticulitis. There is no bowel obstruction. PERITONEUM AND RETROPERITONEUM: No ascites. No free  air. VASCULATURE: Aorta is normal in caliber. Moderate aortic atherosclerosis without aneurysm. LYMPH NODES: No lymphadenopathy. REPRODUCTIVE ORGANS: No acute abnormality. BONES AND SOFT TISSUES: Degenerative changes thoracic and lumbar spine. No focal soft tissue abnormality. IMPRESSION: 1. Increased bilateral lower lobe bronchial thickening and patchy hazy consolidation, worrisome for bilateral pneumonia or aspiration. 2. Moderate sized hiatal hernia with retained food products in the proximal stomach and in the hernia. 3.  Constipation and diverticulosis. 4. Aortic atherosclerosis. Electronically signed by: Francis Quam MD 02/24/2024 07:30 AM EDT RP Workstation: [id number]   DG Chest 2 View Result Date: 02/24/2024 EXAM: 2 VIEW(S) XRAY OF THE CHEST 02/24/2024 05:53:50 AM COMPARISON: Portable chest 10/09/2022. CLINICAL HISTORY: Chest pain [phone]. Chest pain/ pt had difficulty following instructions to remain still during exam. FINDINGS: LUNGS AND PLEURA: The lungs are expiratory. There is perihilar linear atelectasis. No focal pneumonia is evident. No substantial pleural effusion. No pneumothorax. No pulmonary edema. HEART AND MEDIASTINUM: The cardiac size is normal. Stable mediastinum with mild aortic atherosclerosis. No vascular congestion is seen. Sternotomy and cabg changes. BONES AND SOFT TISSUES: Kyphosis and degenerative change of the thoracic spine. No acute osseous abnormality. IMPRESSION: 1. No acute cardiopulmonary findings: Low lung volumes with perihilar atelectasis. . 2. Prior cabg. 3. Aortic atherosclerosis. Electronically signed by: Francis Quam MD 02/24/2024 06:29 AM EDT RP Workstation: [id number]   DG Abd 1 View Result Date: 02/23/2024 CLINICAL DATA:  Constipation EXAM: DG ABDOMEN 1V COMPARISON:  None Available. FINDINGS: Scattered large and small bowel gas is noted. No significant retained fecal material is noted. No abnormal mass or abnormal calcifications are seen. No free air is noted. Mild degenerative changes of lumbar spine are seen. IMPRESSION: No evidence of constipation.  No obstructive changes noted. Electronically Signed   By: Oneil Devonshire M.D.   On: 02/23/2024 15:46   DG Cervical Spine 2-3 Views Result Date: 02/22/2024 CLINICAL DATA:  Surgery EXAM: CERVICAL SPINE - 2-3 VIEW COMPARISON:  11/23/2017 FINDINGS: Multiple intraoperative spot images are submitted with posterior surgical instruments at the C3-4 level. IMPRESSION: Intraoperative localization as above. Electronically Signed   By: Franky Crease M.D.   On: 02/22/2024 20:24   DG C-Arm 1-60 Min-No Report Result Date: 02/22/2024 Fluoroscopy was utilized by the requesting physician.  No radiographic interpretation.   DG C-Arm 1-60 Min-No Report Result Date: 02/22/2024 Fluoroscopy was utilized by the requesting physician.  No radiographic interpretation.   CT ABDOMEN PELVIS W CONTRAST Result Date: 02/16/2024 CLINICAL DATA:  History reported by technologist Lower groin and pelvic pain for 2-3 months.  EXAM: CT ABDOMEN AND PELVIS WITH CONTRAST TECHNIQUE: Multidetector CT imaging of the abdomen and pelvis was performed using the standard protocol following bolus administration of intravenous contrast. RADIATION DOSE REDUCTION: This exam was performed according to the departmental dose-optimization program which includes automated exposure control, adjustment of the mA and/or kV according to patient size and/or use of iterative reconstruction technique. CONTRAST:  85mL OMNIPAQUE  IOHEXOL  300 MG/ML  SOLN COMPARISON:  None Available. FINDINGS: Lower chest: Subpleural atelectatic changes noted in the middle lobe and right lower lobe. The lung bases are otherwise clear. No pleural effusion. The heart is normal in size. No pericardial effusion. Hepatobiliary: The liver is normal in size. Non-cirrhotic configuration. No suspicious mass. These is mild diffuse hepatic steatosis. No intrahepatic or extrahepatic bile duct dilation. No calcified gallstones. Normal gallbladder wall thickness. No pericholecystic inflammatory changes. Pancreas: Unremarkable. No pancreatic ductal dilatation or surrounding inflammatory changes. Spleen: Within normal limits. No focal lesion. Adrenals/Urinary  Tract: Adrenal glands are unremarkable. No suspicious renal mass. There is a 5 mm hypoattenuating focus in the left kidney lower pole, posteriorly, which is too small to adequately characterize but favored to represent a cyst. No hydronephrosis. No renal or ureteric calculi.  Unremarkable urinary bladder. Stomach/Bowel: There is a small sliding hiatal hernia. There is a tiny diverticulum arising from the second part of duodenum. No disproportionate dilation of the small or large bowel loops. No evidence of abnormal bowel wall thickening or inflammatory changes. The appendix is unremarkable. There are multiple diverticula throughout the colon, without imaging signs of diverticulitis. Vascular/Lymphatic: No ascites or pneumoperitoneum. No abdominal or pelvic lymphadenopathy, by size criteria. No aneurysmal dilation of the major abdominal arteries. There are moderate peripheral atherosclerotic vascular calcifications of the aorta and its major branches. Reproductive: Normal size prostate. Symmetric seminal vesicles. Other: There is a tiny fat containing umbilical hernia. The soft tissues and abdominal wall are otherwise unremarkable. Musculoskeletal: No suspicious osseous lesions. There are mild multilevel degenerative changes in the visualized spine. IMPRESSION: 1. No acute inflammatory process identified within the abdomen or pelvis. 2. Multiple other nonacute observations, as described above. Aortic Atherosclerosis (ICD10-I70.0). Electronically Signed   By: Ree Molt M.D.   On: 02/16/2024 14:56   NM PET CT CARDIAC PERFUSION MULTI W/ABSOLUTE BLOODFLOW Result Date: 02/12/2024   LV perfusion is abnormal. Defect 1: There is a medium defect with moderate to severe reduction in uptake present in the apical to mid anterior and anterolateral location(s) that is partially reversible. There is abnormal wall motion in the defect area. Consistent with infarction and peri-infarct ischemia.   Rest left ventricular function is normal. Rest EF: 67%. Stress left ventricular function is normal. Stress EF: 68%. End diastolic cavity size is normal.   Myocardial blood flow reserve is not reported in this patient due to technical or patient-specific concerns that affect accuracy.   Coronary calcium   assessment not performed due to prior revascularization.   Findings are consistent with infarction with peri-infarct ischemia. The study is intermediate risk.   Electronically signed by: Lonni Hanson, MD. EXAM: OVER-READ INTERPRETATION CARDIAC CT CHEST The following report is an over-read performed by radiologist Dr. Ryan Salvage of [facility] on 02/11/2024. This over-read does not include interpretation of cardiac or coronary anatomy or pathology. The cardiac and PET interpretation by the cardiologist is attached or will be attached. COMPARISON:  Chest radiograph 10/09/2022 FINDINGS: Extracardiac Vascular: Aortic atherosclerosis.  Prior CABG. Mediastinum: Small type 1 hiatal hernia. Lung: Subpleural atelectasis or scarring laterally in the right middle lobe. Mild dependent atelectasis in both lower lobes. Included Upper Abdomen: Splenic artery atherosclerosis. Abdominal aortic atherosclerosis. Musculoskeletal: Mild thoracic spondylosis. IMPRESSION: 1. Small type 1 hiatal hernia. 2. Prior CABG. 3.  Aortic Atherosclerosis (ICD10-I70.0). Electronically Signed   By: Ryan Salvage M.D.   On: 02/11/2024 13:37  ECHOCARDIOGRAM COMPLETE Result Date: 02/02/2024    ECHOCARDIOGRAM REPORT   Patient Name:   Osiel Stick. Date of Exam: 02/02/2024 Medical Rec #:  [id number]             Height:       65.0 in Accession #:    [id number]            Weight:       181.0 lb Date of Birth:  June 18, 1950            BSA:          1.896 m Patient Age:    44  years              BP:           125/77 mmHg Patient Gender: M                     HR:           72 bpm. Exam Location:  [address] Procedure: 2D Echo, Cardiac Doppler and Color Doppler (Both Spectral and Color            Flow Doppler were utilized during procedure). Indications:    Chest Pain R07.9  History:        Patient has prior history of Echocardiogram examinations. S/P                 NSTEMI and CAD; Risk Factors:Hypertension.  Sonographer:     Cherene Ravens RDCS Referring Phys: [id number] JESSE M CLEAVER IMPRESSIONS  1. Left ventricular ejection fraction, by estimation, is 55 to 60%. The left ventricle has normal function. The left ventricle has no regional wall motion abnormalities. Left ventricular diastolic parameters are indeterminate.  2. Right ventricular systolic function is normal. The right ventricular size is normal. Tricuspid regurgitation signal is inadequate for assessing PA pressure.  3. The mitral valve is normal in structure. Trivial mitral valve regurgitation. No evidence of mitral stenosis.  4. The aortic valve is normal in structure. Aortic valve regurgitation is not visualized. No aortic stenosis is present.  5. The inferior vena cava is normal in size with greater than 50% respiratory variability, suggesting right atrial pressure of 3 mmHg. FINDINGS  Left Ventricle: Left ventricular ejection fraction, by estimation, is 55 to 60%. The left ventricle has normal function. The left ventricle has no regional wall motion abnormalities. The left ventricular internal cavity size was normal in size. There is  no left ventricular hypertrophy. Left ventricular diastolic parameters are indeterminate. Right Ventricle: The right ventricular size is normal. No increase in right ventricular wall thickness. Right ventricular systolic function is normal. Tricuspid regurgitation signal is inadequate for assessing PA pressure. Left Atrium: Left atrial size was normal in size. Right Atrium: Right atrial size was normal in size. Pericardium: There is no evidence of pericardial effusion. Mitral Valve: The mitral valve is normal in structure. Trivial mitral valve regurgitation. No evidence of mitral valve stenosis. Tricuspid Valve: The tricuspid valve is normal in structure. Tricuspid valve regurgitation is not demonstrated. No evidence of tricuspid stenosis. Aortic Valve: The aortic valve is normal in structure. Aortic valve regurgitation is not visualized. No  aortic stenosis is present. Aortic valve mean gradient measures 2.0 mmHg. Aortic valve peak gradient measures 4.3 mmHg. Aortic valve area, by VTI measures 3.57 cm. Pulmonic Valve: The pulmonic valve was normal in structure. Pulmonic valve regurgitation is not visualized. No evidence of pulmonic stenosis. Aorta: The aortic root is normal in size and structure. Venous: The inferior vena cava is normal in size with greater than 50% respiratory variability, suggesting right atrial pressure of 3 mmHg. IAS/Shunts: No atrial level shunt detected by color flow Doppler.  LEFT VENTRICLE PLAX 2D LVIDd:         3.76 cm   Diastology LVIDs:         2.56 cm   LV e' medial:    4.13 cm/s LV PW:         1.04 cm   LV E/e' medial:  15.2 LV IVS:        0.98 cm   LV  e' lateral:   6.64 cm/s LVOT diam:     2.10 cm   LV E/e' lateral: 9.4 LV SV:         69 LV SV Index:   36 LVOT Area:     3.46 cm  RIGHT VENTRICLE            IVC RV Basal diam:  2.84 cm    IVC diam: 1.24 cm RV Mid diam:    2.65 cm RV S prime:     9.14 cm/s TAPSE (M-mode): 2.2 cm LEFT ATRIUM             Index        RIGHT ATRIUM           Index LA diam:        3.80 cm 2.00 cm/m   RA Pressure: 3.00 mmHg LA Vol (A2C):   39.2 ml 20.68 ml/m  RA Area:     10.40 cm LA Vol (A4C):   31.7 ml 16.72 ml/m  RA Volume:   20.50 ml  10.81 ml/m LA Biplane Vol: 35.6 ml 18.78 ml/m  AORTIC VALVE AV Area (Vmax):    3.31 cm AV Area (Vmean):   3.16 cm AV Area (VTI):     3.57 cm AV Vmax:           104.00 cm/s AV Vmean:          70.000 cm/s AV VTI:            0.192 m AV Peak Grad:      4.3 mmHg AV Mean Grad:      2.0 mmHg LVOT Vmax:         99.40 cm/s LVOT Vmean:        63.900 cm/s LVOT VTI:          0.198 m LVOT/AV VTI ratio: 1.03  AORTA Ao Root diam: 2.90 cm Ao Asc diam:  3.10 cm MV E velocity: 62.60 cm/s  TRICUSPID VALVE MV A velocity: 87.80 cm/s  Estimated RAP:  3.00 mmHg MV E/A ratio:  0.71                            SHUNTS                            Systemic VTI:  0.20 m                             Systemic Diam: 2.10 cm Kardie Tobb DO Electronically signed by Dub Huntsman DO Signature Date/Time: 02/02/2024/4:51:32 PM    Final     Microbiology: Results for orders placed or performed during the hospital encounter of 02/22/24  Culture, blood (Routine X 2) w Reflex to ID Panel     Status: None (Preliminary result)   Collection Time: 02/24/24  3:23 PM   Specimen: BLOOD  Result Value Ref Range Status   Specimen Description BLOOD LEFT ANTECUBITAL  Final   Special Requests   Final    BOTTLES DRAWN AEROBIC ONLY Blood Culture results may not be optimal due to an inadequate volume of blood received in culture bottles   Culture   Final    NO GROWTH 3 DAYS Performed at [facility], [address] [place], [place] [address]    Report Status PENDING  Incomplete  Culture, blood (Routine X  2) w Reflex to ID Panel     Status: None (Preliminary result)   Collection Time: 02/24/24  3:25 PM   Specimen: BLOOD  Result Value Ref Range Status   Specimen Description BLOOD BLOOD LEFT FOREARM  Final   Special Requests   Final    BOTTLES DRAWN AEROBIC ONLY Blood Culture results may not be optimal due to an inadequate volume of blood received in culture bottles   Culture   Final    NO GROWTH 3 DAYS Performed at [facility], [address] [place], [place] [address]    Report Status PENDING  Incomplete    Labs: CBC: Recent Labs  Lab 02/23/24 1847 02/24/24 0612 02/24/24 1155 02/25/24 0206 02/25/24 1026 02/25/24 1404 02/26/24 0554 02/26/24 1205 02/26/24 2024 02/26/24 2339 02/27/24 0609 02/27/24 1224  WBC 14.5* 10.2  --  9.2  --   --  10.9*  --   --   --  11.1*  --   HGB 10.4* 9.7*   < > 7.7* 9.8*   < > 8.5* 9.0* 9.6* 9.1* 8.7* 10.0*  HCT 29.9* 29.1*  --  22.1* 29.4*  --  24.7*  --   --   --  25.8*  --   MCV 90.9 92.4  --  91.7  --   --  91.1  --   --   --  91.5  --   PLT 267 214  --  152  --   --  202  --   --   --  231  --    < > = values in this interval  not displayed.   Basic Metabolic Panel: Recent Labs  Lab 02/24/24 0612 02/24/24 2147 02/24/24 2341 02/25/24 0206 02/25/24 1404 02/26/24 0554 02/27/24 0609  NA 136  --  140 140 143 141 144  K 4.6  --  4.4 4.3 3.9 4.3 4.0  CL 104  --  110 112* 109 111 109  CO2 21*  --  20* 20* 22 23 21*  GLUCOSE 121*  --  163* 160* 142* 142* 107*  BUN 36*  --  27* 26* 26* 24* 28*  CREATININE 1.52*  --  1.55* 1.47* 1.54* 1.55* 1.43*  CALCIUM  8.0*  --  7.7* 7.4* 7.9* 7.8* 8.1*  MG 1.6* 2.7*  --  2.7*  --  2.6* 2.5*  PHOS 1.9* 2.5  --  2.2* 2.0* 3.3 3.1   Liver Function Tests: Recent Labs  Lab 02/23/24 1847 02/24/24 0612  AST 40 34  ALT 33 26  ALKPHOS 48 38  BILITOT 0.8 0.7  PROT 6.0* 5.6*  ALBUMIN 3.4* 3.3*   CBG: Recent Labs  Lab 02/24/24 0636 02/24/24 0717  GLUCAP 116* 116*    Discharge time spent:  34 minutes.  Signed: Drue ONEIDA Potter, MD [facility] 02/27/2024

## 2024-02-27 NOTE — Progress Notes (Signed)
 Patient is not able to walk the distance required to go the bathroom, or he/she is unable to safely negotiate stairs required to access the bathroom.  A 3in1 BSC will alleviate this problem

## 2024-02-27 NOTE — Progress Notes (Signed)
 Patient provided with instruction sheet for home foley care.

## 2024-02-27 NOTE — TOC Transition Note (Signed)
 Transition of Care [facility]) - Discharge Note   Patient Details  Name: Billy Cisneros. MRN: [id number] Date of Birth: April 09, 1951  Transition of [facility] CM/SW Contact:  [place] L Jamarkus [place], LCSW Phone Number: 02/27/2024, 4:27 PM   Clinical Narrative:     Discharge orders are in. Home Health Services set up with [facility]. RW and BSC has been ordered through [facility].   No further TOC needs.        Patient Goals and CMS Choice            Discharge Placement                       Discharge Plan and Services Additional resources added to the After Visit Summary for                                       Social Drivers of Health (SDOH) Interventions SDOH Screenings   Food Insecurity: No Food Insecurity (02/22/2024)  Housing: Low Risk  (02/22/2024)  Transportation Needs: No Transportation Needs (02/22/2024)  Utilities: Patient Declined (02/22/2024)  Financial Resource Strain: Low Risk  (08/11/2023)   Received from [facility]  Social Connections: Patient Declined (02/22/2024)  Tobacco Use: Low Risk  (02/22/2024)     Readmission Risk Interventions     No data to display

## 2024-02-29 LAB — CULTURE, BLOOD (ROUTINE X 2)
Culture: NO GROWTH
Culture: NO GROWTH

## 2024-03-04 NOTE — Progress Notes (Signed)
   REFERRING PHYSICIAN:  [name], Md [address] [facility] [place],  [place] [address]  DOS: 02/22/24  C3-C7 laminoplasty   HISTORY OF PRESENT ILLNESS: Billy Stthomas. is 2 weeks status post above surgery.  Postop course complicated by acute metabolic encephalopathy and acute respiratory failure with hypoxia secondary to aspiration pneumonia. He was transferred to ICU on POD #2. He was discharged with foley and f/u with urology.   He notes some neck pain and stiffness with pain into his shoulders that is intermittent. Overall, he is doing well. He is taking prn robaxin . Not taking anything for pain. He feels like his walking has improved.   He still has foley. Has appointment on Thursday with urology.    PHYSICAL EXAMINATION:  NEUROLOGICAL:  General: In no acute distress.   Awake, alert, oriented to person, place, and time.  Pupils equal round and reactive to light.  Facial tone is symmetric.    Strength: Side Biceps Triceps Deltoid Interossei Grip Wrist Ext. Wrist Flex.  R 5 5 5 5 5 5 5   L 5 5 5 5 5 5 5    Incision c/d/i  Imaging:  Nothing new to review.   Assessment / Plan: Billy Rodman. is doing well s/p above surgery. Treatment options reviewed with patient and following plan made:   - We discussed activity escalation and I have advised the patient to lift up to 10 pounds until 6 weeks after surgery (until follow up with Dr. Clois).   - Reviewed wound care.  - Continue current medications including prn robaxin  and tylenol .  - Follow up as scheduled in 4 weeks and prn.   Advised to contact the office if any questions or concerns arise.   Glade Boys PA-C Dept of Neurosurgery

## 2024-03-08 ENCOUNTER — Telehealth: Payer: Self-pay | Admitting: Internal Medicine

## 2024-03-08 ENCOUNTER — Encounter: Payer: Self-pay | Admitting: Orthopedic Surgery

## 2024-03-08 ENCOUNTER — Ambulatory Visit: Admitting: Orthopedic Surgery

## 2024-03-08 VITALS — BP 122/80 | Temp 97.8°F | Ht 65.0 in | Wt 181.0 lb

## 2024-03-08 DIAGNOSIS — Z09 Encounter for follow-up examination after completed treatment for conditions other than malignant neoplasm: Secondary | ICD-10-CM

## 2024-03-08 DIAGNOSIS — G959 Disease of spinal cord, unspecified: Secondary | ICD-10-CM

## 2024-03-08 DIAGNOSIS — M4802 Spinal stenosis, cervical region: Secondary | ICD-10-CM

## 2024-03-08 NOTE — Telephone Encounter (Signed)
 Pt c/o medication issue:  1. Name of Medication:   isosorbide  mononitrate (IMDUR ) 60 MG 24 hr tablet   2. How are you currently taking this medication (dosage and times per day)?   3. Are you having a reaction (difficulty breathing--STAT)?   4. What is your medication issue?    Patient stated he recently had neck surgery and while in the hospital they cut back on this medication to 30 mg.  Patient wants to know if he should re-start taking 60 mg dosage.

## 2024-03-08 NOTE — Telephone Encounter (Signed)
 Spoke with pt and explained that because he was hypotensive in the hospital, they had stopped his BP medications but restarted them at DC with some being at lower doses. Explained that for the time being, the Imdur  needed to stay at 30 mg unless he starts to see his BP trending upwards and then he would need to call our office to make us  aware for further recommendations. Pt verbalized understanding of plan and had no further questions at this time.

## 2024-03-09 ENCOUNTER — Telehealth: Payer: Self-pay | Admitting: Neurosurgery

## 2024-03-09 NOTE — Telephone Encounter (Signed)
 Mardeen from [facility] has called to inform us  that the patient has denied an occupational therapy evaluation.  Any questions: [phone]

## 2024-03-18 NOTE — Progress Notes (Deleted)
 Cardiology Clinic Note   Patient Name: Billy Cisneros. Date of Encounter: 03/18/2024  Primary Care Provider:  [name], MD Primary Cardiologist:  Billy Cisneros Maxcy, MD  Patient Profile    Billy Cisneros. 73 year old male presents the clinic today for follow-up evaluation of his coronary artery disease .  Past Medical History    Past Medical History:  Diagnosis Date   Anxiety    a.) on BZO (alprazolam ) PRN   Cervical myelopathy (HCC)    CKD (chronic kidney disease) stage 3, GFR 30-59 ml/min (HCC)    Coronary artery disease 03/15/1999   a.) LHC 03/15/1999: multi-vessel CAD --> CVTS; b.) s/p 5v CABG 03/18/1999; c.) LHC 03/29/2003, 03/12/2006, 01/12/2007, 05/02/2010 - MV Dz with patent bypass grafts x 5; d.) LHC 10/14/2019: MV Dz with CTO OM1; e.) LHC 11/11/2021: MV Dz with CTO SVG-OM1 and SVG-D1; f.) NSTEMI 10/09/2022 --> LHC 10/10/2022: MV Dz with CTO OM1 and SVG D1 - med mgmt   DDD (degenerative disc disease), thoracolumbar    Depression    Diverticulosis    Duodenal diverticulum    GERD (gastroesophageal reflux disease)    Gout    Hepatic steatosis    Hiatal hernia    History of cocaine abuse (HCC)    last used in 2015 per pt (as of 02-15-24)   Hyperlipidemia    Hypertension    Hypothyroidism    Insomnia    a.) on hypnotic (zolpidem ) PRN   Long-term use of aspirin  therapy    Nausea and vomiting 02/24/2024   NSTEMI (non-ST elevated myocardial infarction) (HCC) 10/09/2022   S/P CABG x 5 03/18/1999   a.) LIMA-LAD, RIMA-RCA, SVG-OM1, SVG-D1, SVG-RI   Spinal stenosis of cervical region with radiculopathy    Umbilical hernia    Unstable angina (HCC) 11/11/2021   Past Surgical History:  Procedure Laterality Date   CERVICAL LAMINOPLASTY N/A 02/22/2024   Procedure: CERVICAL LAMINOPLASTY;  Surgeon: Clois Fret, MD;  Location: [facility] ORS;  Service: Neurosurgery;  Laterality: N/A;  C3-7 LAMINOPLASTY   COLONOSCOPY WITH PROPOFOL  N/A 05/25/2017    Procedure: COLONOSCOPY WITH PROPOFOL ;  Surgeon: Therisa Bi, MD;  Location: [facility] ENDOSCOPY;  Service: Gastroenterology;  Laterality: N/A;   CORONARY ARTERY BYPASS GRAFT N/A 03/18/1999   Procedure: CORONARY ARTERY BYPASS GRAFT; Location: [place]   ESOPHAGOGASTRODUODENOSCOPY N/A 10/27/2017   Procedure: ESOPHAGOGASTRODUODENOSCOPY (EGD);  Surgeon: Unk Corinn Skiff, MD;  Location: [facility] ENDOSCOPY;  Service: Gastroenterology;  Laterality: N/A;   ESOPHAGOGASTRODUODENOSCOPY (EGD) WITH PROPOFOL  N/A 11/19/2017   Procedure: ESOPHAGOGASTRODUODENOSCOPY (EGD) WITH PROPOFOL ;  Surgeon: Unk Corinn Skiff, MD;  Location: [facility] ENDOSCOPY;  Service: Gastroenterology;  Laterality: N/A;   LEFT HEART CATH AND CORONARY ANGIOGRAPHY Left 03/15/1999   Procedure: LEFT HEART CATH AND CORONARY ANGIOGRAPHY; Location: [facility]   LEFT HEART CATH AND CORS/GRAFTS ANGIOGRAPHY N/A 10/14/2019   Procedure: LEFT HEART CATH AND CORS/GRAFTS ANGIOGRAPHY;  Surgeon: Claudene Victory ORN, MD;  Location: [facility] INVASIVE CV LAB;  Service: Cardiovascular;  Laterality: N/A;   LEFT HEART CATH AND CORS/GRAFTS ANGIOGRAPHY N/A 11/11/2021   Procedure: LEFT HEART CATH AND CORS/GRAFTS ANGIOGRAPHY;  Surgeon: Mady Bruckner, MD;  Location: [facility] INVASIVE CV LAB;  Service: Cardiovascular;  Laterality: N/A;   LEFT HEART CATH AND CORS/GRAFTS ANGIOGRAPHY N/A 10/10/2022   Procedure: LEFT HEART CATH AND CORS/GRAFTS ANGIOGRAPHY;  Surgeon: Jordan, Peter M, MD;  Location: [facility] INVASIVE CV LAB;  Service: Cardiovascular;  Laterality: N/A;   LEFT HEART CATH AND CORS/GRAFTS ANGIOGRAPHY Left 03/29/2003  Procedure: LEFT HEART CATH AND CORS/GRAFTS ANGIOGRAPHY; Location: [facility]; Surgeon: Billy Jude, MD   LEFT HEART CATH AND CORS/GRAFTS ANGIOGRAPHY Left 03/12/2006   Procedure: LEFT HEART CATH AND CORS/GRAFTS ANGIOGRAPHY; Location: [facility]; Surgeon: Wolm Rhyme, MD   LEFT HEART CATH AND CORS/GRAFTS ANGIOGRAPHY Left 01/12/2007   Procedure: LEFT HEART CATH AND CORS/GRAFTS  ANGIOGRAPHY; Location: [facility]; Surgeon: Wolm Rhyme, MD   LEFT HEART CATH AND CORS/GRAFTS ANGIOGRAPHY Left 05/02/2010   Procedure: LEFT HEART CATH AND CORS/GRAFTS ANGIOGRAPHY; Location: [facility]; Surgeon: Marsa Dooms, MD    Allergies  No Known Allergies  History of Present Illness    Billy Cisneros. has a PMH of coronary artery disease status post CABG in 2000 (LIMA to LAD, RIMA to RCA, SVG to OM1, diagonal and ramus) underwent cardiac catheterization 10/2019 which showed patent LIMA-LAD, patent RIMA-PDA, total occluded distal left main, totally occluded ostial circumflex, totally occluded ostial LAD and normal LVEDP.  Medical management was recommended.  His PMH also includes HTN, anxiety, gout, and GERD.  He was seen in follow-up by Dr. Mona on 09/23/2019.  During that time he reported difficulty with chest discomfort.  He described exertional type chest pain with left arm heaviness.  His symptoms improved with rest.  It was noted that his LDL cholesterol was 62 but his triglycerides remained elevated at 320.  He underwent cardiac catheterization by Dr. Claudene 6/21.  At that time his Imdur  was uptitrated.  He presented to the clinic 10/30/20 for follow-up evaluation stated he had not noticed a difference in his chest discomfort with the addition of metoprolol .  He did not start his Imdur  medication.  He reported that with increased physical activity he noticed chest discomfort.  He gave an example of taking out his trash.  When he would rest after increased activity his chest discomfort would ease off and go away.  We reviewed his angiography and he and his wife expressed understanding.  He reported that he generally was fairly sedentary.  He also reported that he did not eat much fiber.  We reviewed his cholesterol panel and he reported that over the last several months he had not watched his diet as closely.  I had him start his Imdur , continue metoprolol , increase his physical activity,  start fiber supplementation, and planned follow-up in 6 months.    He presented to the clinic 09/12/21 for follow-up evaluation and stated he felt well.  He was unable to come to his last 2 appointments due to his daughter being in a car accident and then having flu.  We reviewed his current medication list and he is tolerating his medications well without side effects.  He did note some atypical chest discomfort at night when he laid down.  However, he denied exertional chest discomfort.  He had been walking on the treadmill daily and going to the [facility] 1 time per week.  He reported that he had been losing some weight.  I refilled his metoprolol , requested his updated lab work from his PCP and planned follow-up for 12 months.  He presented to the emergency department on 11/11/2021 with unstable angina.  He underwent left heart cath which showed severe coronary artery disease including subtotal chronic occlusion of the ostial LAD, proximal ramus, proximal circumflex and severe diffuse RCA disease.  He had a patent LIMA-LAD 30% stenosed, widely patent RIMA-RPDA, patent SVG-ramus, chronically occluded SVG-D.  Medical management was continued.  It was felt that running out of his isosorbide  mononitrate brought on his recurrent  angina.  He was discharged in stable condition on 11/12/2021.  He presents to the clinic 11/22/21 for follow-up evaluation stated that he had been off of his Imdur  for about 5 days.  He did not have a new prescription of sublingual nitroglycerin .  He reported that he had nitro spray in the past but never used it and his prescription was old.  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  He occasionally lifted heavier materials in and out of his fleeta.  He did report a fall out of bed 11/21/21.  He reported he rolled the wrong way.  We reviewed the importance of heart healthy low-sodium diet.  We reviewed his angiography and he expressed understanding.  I refilled his sublingual nitroglycerin ,  continued his current medication regimen, and plan follow-up in 4 to 6 months.  He was admitted to the hospital on 10/09/2022 and discharged on 10/11/2022.  He was noted to have NSTEMI.  His cardiac troponins trended up from 6-153, 441, and 461.  His EKG showed no acute ST changes.  He underwent cardiac catheterization on 10/10/2022.  He was not noted to have change from prior cardiac catheterization 7/23.  Medical management was continued.  His echocardiogram 10/10/2022 showed an EF of 55-60%, G1 DD, trivial mitral valve regurgitation.  He had presented with pain in his neck to [facility] ED.  CT spine showed no acute fracture but did note worsening moderate spinal stenosis along C4-C5 and severe bilateral neuroforaminal stenosis at C3-C4.  Recommendation to follow-up with neurosurgery was recommended.  He presented to the clinic 06/02/23 for follow-up evaluation and stated he had been doing well.  He was working part-time.  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  He did note occasionally that when he lifted a heavy parts bin he would get chest discomfort.  His chest discomfort was relieved with rest and occasionally he needed to take a sublingual nitroglycerin .  We reviewed his spring cardiac cath and echocardiogram.  He expressed understanding.  He continued to have cervical spine issues.  He had been prescribed physical therapy which he would be starting .   Follow-up in 6 to 9 months was planned.   He presents to the clinic today for follow-up evaluation and states recently has been noticing more instances chest pain.  These come on with activity and are relieved with rest.  He reports that when he is working and lifting heavy parts bends he occasionally has to take multiple doses of nitroglycerin  throughout the day.  He is limited in his physical activity due to his spinal issues.  He believes that some of his symptoms may be related to this as well.  He reports increased fatigue and decreased  exercise tolerance.  He was seen by his PCP who recommended he come for sooner follow-up.  His recent lab work was unremarkable.  I will order cardiac PET/CT and echocardiogram for further evaluation.  Will plan follow-up in 2 months.  Today he denies  shortness of breath, lower extremity edema,  palpitations, melena, hematuria, hemoptysis, diaphoresis, weakness, presyncope, syncope, orthopnea, and PND.     Home Medications    Prior to Admission medications   Medication Sig Start Date End Date Taking? Authorizing Provider  ALPRAZolam  (XANAX ) 0.5 MG tablet TAKE 1 TABLET BY MOUTH 3 TIMES A DAY AS NEEDED FOR ANXIETY. Patient taking differently: Take 0.5 mg by mouth 3 (three) times daily as needed for anxiety. TAKE 1 TABLET BY MOUTH 3 TIMES A DAY  AS NEEDED FOR ANXIETY. 06/24/17   Maree Leni Edyth DELENA, MD  aspirin  EC 81 MG tablet Take 1 tablet (81 mg total) by mouth daily. For heart attack prevention. 11/22/11   Rochel Aureliano DASEN, PA-C  buPROPion  (WELLBUTRIN  XL) 300 MG 24 hr tablet Take 1 tablet (300 mg total) by mouth daily. 06/24/17   Maree Leni Edyth DELENA, MD  HYDROcodone -acetaminophen  (NORCO/VICODIN) 5-325 MG tablet Take 1 tablet by mouth every 6 (six) hours as needed for moderate pain.  12/29/17   [provider]  icosapent  Ethyl (VASCEPA ) 1 g capsule Take 2 capsules (2 g total) by mouth 2 (two) times daily. 09/23/19   Hilty, Billy BROCKS, MD  isosorbide  mononitrate (IMDUR ) 30 MG 24 hr tablet Take 1 tablet (30 mg total) by mouth daily. 10/14/19 10/13/20  Claudene Victory ORN, MD  lisinopril  (PRINIVIL ,ZESTRIL ) 10 MG tablet Take 1 tablet (10 mg total) by mouth daily. 06/24/17   Maree Leni Edyth DELENA, MD  metoprolol  succinate (TOPROL  XL) 25 MG 24 hr tablet Take 1 tablet (25 mg total) by mouth daily. 10/14/19 10/13/20  Claudene Victory ORN, MD  Multiple Vitamins-Minerals (MULTIVITAMIN WITH MINERALS) tablet Take 1 tablet by mouth daily.    [provider]  omeprazole  (PRILOSEC) 20 MG capsule Take 1 capsule (20 mg total) by  mouth 2 (two) times daily before a meal. 06/24/17   Maree Leni Edyth DELENA, MD  simvastatin  (ZOCOR ) 40 MG tablet Take 1 tablet (40 mg total) by mouth at bedtime. 06/24/17   Maree Leni Edyth DELENA, MD  valACYclovir  (VALTREX ) 500 MG tablet Take 500 mg by mouth 2 (two) times daily as needed (outbreak).     [provider]  vitamin B-12 (CYANOCOBALAMIN) 500 MCG tablet Take 500 mcg by mouth daily.    [provider]  zolpidem  (AMBIEN ) 10 MG tablet Take 1 tablet (10 mg total) by mouth at bedtime. Patient taking differently: Take 10 mg by mouth at bedtime as needed for sleep.  06/24/17   Maree Leni Edyth DELENA, MD    Family History    Family History  Problem Relation Age of Onset   Heart attack Mother    Hyperlipidemia Mother    Hypertension Mother    Lung cancer Father    Diabetes Sister    Diabetes Brother    Asthma Daughter    Cancer Maternal Grandmother    Stroke Maternal Grandfather    Heart attack Paternal Grandfather    He indicated that his mother is deceased. He indicated that his father is deceased. He indicated that his sister is alive. He indicated that his brother is alive. He indicated that his maternal grandmother is deceased. He indicated that his maternal grandfather is deceased. He indicated that his paternal grandmother is deceased. He indicated that his paternal grandfather is deceased. He indicated that his daughter is alive.   Social History    Social History   Socioeconomic History   Marital status: Married    Spouse name: Not on file   Number of children: Not on file   Years of education: Not on file   Highest education level: Not on file  Occupational History   Not on file  Tobacco Use   Smoking status: Never   Smokeless tobacco: Never  Vaping Use   Vaping status: Never Used  Substance and Sexual Activity   Alcohol  use: Yes    Comment: rare   Drug use: Not Currently    Frequency: 2.0 times per week  Types: Cocaine    Comment: last used in 2015    Sexual activity: Yes    Birth control/protection: None  Other Topics Concern   Not on file  Social History Narrative   Not on file   Social Drivers of Health   Financial Resource Strain: Low Risk  (03/02/2024)   Received from [facility]   Overall Financial Resource Strain (CARDIA)    Difficulty of Paying Living Expenses: Not hard at all  Food Insecurity: No Food Insecurity (03/02/2024)   Received from [facility]   Hunger Vital Sign    Within the past 12 months, you worried that your food would run out before you got the money to buy more.: Never true    Within the past 12 months, the food you bought just didn't last and you didn't have money to get more.: Never true  Transportation Needs: No Transportation Needs (03/02/2024)   Received from [facility] - Transportation    In the past 12 months, has lack of transportation kept you from medical appointments or from getting medications?: No    Lack of Transportation (Non-Medical): No  Physical Activity: Not on file  Stress: Not on file  Social Connections: Patient Declined (02/22/2024)   Social Connection and Isolation Panel    Frequency of Communication with Friends and Family: Patient declined    Frequency of Social Gatherings with Friends and Family: Patient declined    Attends Religious Services: Patient declined    Database Administrator or Organizations: Patient declined    Attends Banker Meetings: Patient declined    Marital Status: Patient declined  Intimate Partner Violence: Not At Risk (02/22/2024)   Humiliation, Afraid, Rape, and Kick questionnaire    Fear of Current or Ex-Partner: No    Emotionally Abused: No    Physically Abused: No    Sexually Abused: No     Review of Systems    General:  No chills, fever, night sweats or weight changes.  Cardiovascular:  No chest pain, dyspnea on exertion, edema, orthopnea, palpitations, paroxysmal  nocturnal dyspnea. Dermatological: No rash, lesions/masses Respiratory: No cough, dyspnea Urologic: No hematuria, dysuria Abdominal:   No nausea, vomiting, diarrhea, bright red blood per rectum, melena, or hematemesis Neurologic:  No visual changes, wkns, changes in mental status. All other systems reviewed and are otherwise negative except as noted above.  Physical Exam    VS:  There were no vitals taken for this visit. , BMI There is no height or weight on file to calculate BMI. GEN: Well nourished, well developed, in no acute distress. HEENT: normal. Neck: Supple, no JVD, carotid bruits, or masses. Cardiac: RRR, no murmurs, rubs, or gallops. No clubbing, cyanosis, edema.  Radials/DP/PT 2+ and equal bilaterally.  Respiratory:  Respirations regular and unlabored, clear to auscultation bilaterally. GI: Soft, nontender, nondistended, BS + x 4. MS: no deformity or atrophy. Skin: warm and dry, no rash. Neuro:  Strength and sensation are intact. Psych: Normal affect.  Accessory Clinical Findings    Recent Labs: 02/24/2024: ALT 26 02/27/2024: B Natriuretic Peptide 232.2; BUN 28; Creatinine, Ser 1.43; Hemoglobin 10.0; Magnesium  2.5; Platelets 231; Potassium 4.0; Sodium 144   Recent Lipid Panel    Component Value Date/Time   CHOL 130 10/10/2022 0941   CHOL 146 01/08/2015 0925   CHOL 141 08/06/2011 0327   TRIG 141 10/10/2022 0941   TRIG 375 (H) 08/06/2011 0327   HDL 43 10/10/2022  0941   HDL 42 01/08/2015 0925   HDL 25 (L) 08/06/2011 0327   CHOLHDL 3.0 10/10/2022 0941   VLDL 28 10/10/2022 0941   VLDL 75 (H) 08/06/2011 0327   LDLCALC 59 10/10/2022 0941   LDLCALC 76 01/08/2015 0925   LDLCALC 41 08/06/2011 0327    ECG personally reviewed by me today EKG Interpretation Date/Time:    Ventricular Rate:    PR Interval:    QRS Duration:    QT Interval:    QTC Calculation:   R Axis:      Text Interpretation:     EKG 09/12/2021  normal sinus rhythm 80 bpm  Cardiac  catheterization 10/14/2019 Patent saphenous vein graft to the ramus intermedius but occlusion occlusion of the saphenous vein graft limb to the obtuse marginal.   Patent LIMA to distal LAD Patent RIMA to PDA Totally occluded distal left main Totally occluded ostial circumflex Totally occluded ostial LAD Normal LV end-diastolic pressure.   RECOMMENDATIONS:   Toprol -XL 25 mg/day After 1 week if angina is still significant, add Imdur  30 mg/day. Further titrate anti-ischemic therapy until relief of symptoms. No interventional targets.    Diagnostic Dominance: Right    Intervention   Cardiac Catheterization: 10/10/2022    Ost LM to Dist LM lesion is 50% stenosed.   Mid LAD lesion is 100% stenosed.   Ost LAD to Mid LAD lesion is 99% stenosed.   Ost Cx to Prox Cx lesion is 99% stenosed.   Origin lesion is 30% stenosed.   Origin to Insertion lesion between Ramus and 3rd Mrg  is 100% stenosed.   Origin to Insertion lesion is 100% stenosed.   3rd Mrg lesion is 100% stenosed.   LIMA and is small.   SVG and is normal in caliber.   SVG.   LV end diastolic pressure is normal.   Severe 3 vessel obstructive CAD Patent LIMA to the LAD Patent RIMA to the PDA Patent SVG to the ramus intermedius Normal LVEDP   Plan: no change compared to prior cardiac cath in July 2023. Continue medical management.  Assessment & Plan   1.  Coronary artery disease-notes he is fairly sedentary.  He notices chest pain with lifting heavy parts bends and carrying trash cans out to the curb.  He reports that this is becoming more frequent.  At times he is usually nitroglycerin  intermittently throughout his days.  This coincides with him lifting heavier things.  He reports that his pain is relieved with the nitroglycerin .  Recent blood work reassuring.  He had cardiac catheterization 10/10/2022 in the setting of NSTEMI with elevated troponins.  No changes were noted from his previous 7/23 cardiac catheterization  and medical management was recommended.  His cardiac catheterization 11/11/21 showed severe coronary artery disease including subtotal chronic occlusion of the ostial LAD, proximal ramus, proximal circumflex and severe diffuse RCA disease.  He had a patent LIMA-LAD 30% stenosed, widely patent RIMA-RPDA, patent SVG-ramus, chronically occluded SVG-D.  Continue aspirin , Imdur , lisinopril , metoprolol , omega-3 fatty acids, rosuvastatin , sublingual nitroglycerin  as needed Heart healthy low-sodium diet-reviewed Ordered echocardiogram, cardiac PET CT  Hyperlipidemia-LDL 59 on 10/10/22.   Continue Lovaza , Crestor , omega-3 fatty acids Heart healthy low-sodium high-fiber diet Increase physical activity as tolerated Follow any PCP  Essential hypertension-BP today 108/68 Continue to maintain blood pressure log Decrease metoprolol  to 25 mg  Spinal stenosis-continues to note cervical pain.  During admission 5/24 - 6/24.  He reports neck discomfort with lifting.  Spine CT showed moderate spinal  stenosis along C4-C5 and severe bilateral neuroforaminal stenosis at C3-C4. Following with neurosurgery Continue physical therapy exercises  Disposition: Follow-up with Dr. Mona or me in 2 months.  Josefa HERO. Graycen Sadlon NP-C    03/18/2024, 7:37 AM [facility] [address] Office [phone] Fax [phone]    I spent 14***  minutes examining this patient, reviewing medications, and using patient centered shared decision making involving their cardiac care. I spent greater than 20 minutes reviewing  past medical history,  medications, and prior cardiac tests.

## 2024-03-20 NOTE — Progress Notes (Unsigned)
 Cardiology Clinic Note   Patient Name: Billy Cisneros. Date of Encounter: 03/20/2024  Primary Care Provider:  [name], MD Primary Cardiologist:  Billy JAYSON Maxcy, MD  Patient Profile    Billy Cisneros. 73 year old male presents the clinic today for follow-up evaluation of his coronary artery disease .  Past Medical History    Past Medical History:  Diagnosis Date   Anxiety    a.) on BZO (alprazolam ) PRN   Cervical myelopathy (HCC)    CKD (chronic kidney disease) stage 3, GFR 30-59 ml/min (HCC)    Coronary artery disease 03/15/1999   a.) LHC 03/15/1999: multi-vessel CAD --> CVTS; b.) s/p 5v CABG 03/18/1999; c.) LHC 03/29/2003, 03/12/2006, 01/12/2007, 05/02/2010 - MV Dz with patent bypass grafts x 5; d.) LHC 10/14/2019: MV Dz with CTO OM1; e.) LHC 11/11/2021: MV Dz with CTO SVG-OM1 and SVG-D1; f.) NSTEMI 10/09/2022 --> LHC 10/10/2022: MV Dz with CTO OM1 and SVG D1 - med mgmt   DDD (degenerative disc disease), thoracolumbar    Depression    Diverticulosis    Duodenal diverticulum    GERD (gastroesophageal reflux disease)    Gout    Hepatic steatosis    Hiatal hernia    History of cocaine abuse (HCC)    last used in 2015 per pt (as of 02-15-24)   Hyperlipidemia    Hypertension    Hypothyroidism    Insomnia    a.) on hypnotic (zolpidem ) PRN   Long-term use of aspirin  therapy    Nausea and vomiting 02/24/2024   NSTEMI (non-ST elevated myocardial infarction) (HCC) 10/09/2022   S/P CABG x 5 03/18/1999   a.) LIMA-LAD, RIMA-RCA, SVG-OM1, SVG-D1, SVG-RI   Spinal stenosis of cervical region with radiculopathy    Umbilical hernia    Unstable angina (HCC) 11/11/2021   Past Surgical History:  Procedure Laterality Date   CERVICAL LAMINOPLASTY N/A 02/22/2024   Procedure: CERVICAL LAMINOPLASTY;  Surgeon: Clois Fret, MD;  Location: [facility] ORS;  Service: Neurosurgery;  Laterality: N/A;  C3-7 LAMINOPLASTY   COLONOSCOPY WITH PROPOFOL  N/A 05/25/2017    Procedure: COLONOSCOPY WITH PROPOFOL ;  Surgeon: Therisa Bi, MD;  Location: [facility] ENDOSCOPY;  Service: Gastroenterology;  Laterality: N/A;   CORONARY ARTERY BYPASS GRAFT N/A 03/18/1999   Procedure: CORONARY ARTERY BYPASS GRAFT; Location: [place]   ESOPHAGOGASTRODUODENOSCOPY N/A 10/27/2017   Procedure: ESOPHAGOGASTRODUODENOSCOPY (EGD);  Surgeon: Unk Corinn Skiff, MD;  Location: [facility] ENDOSCOPY;  Service: Gastroenterology;  Laterality: N/A;   ESOPHAGOGASTRODUODENOSCOPY (EGD) WITH PROPOFOL  N/A 11/19/2017   Procedure: ESOPHAGOGASTRODUODENOSCOPY (EGD) WITH PROPOFOL ;  Surgeon: Unk Corinn Skiff, MD;  Location: [facility] ENDOSCOPY;  Service: Gastroenterology;  Laterality: N/A;   LEFT HEART CATH AND CORONARY ANGIOGRAPHY Left 03/15/1999   Procedure: LEFT HEART CATH AND CORONARY ANGIOGRAPHY; Location: [facility]   LEFT HEART CATH AND CORS/GRAFTS ANGIOGRAPHY N/A 10/14/2019   Procedure: LEFT HEART CATH AND CORS/GRAFTS ANGIOGRAPHY;  Surgeon: Claudene Victory ORN, MD;  Location: [facility] INVASIVE CV LAB;  Service: Cardiovascular;  Laterality: N/A;   LEFT HEART CATH AND CORS/GRAFTS ANGIOGRAPHY N/A 11/11/2021   Procedure: LEFT HEART CATH AND CORS/GRAFTS ANGIOGRAPHY;  Surgeon: Mady Bruckner, MD;  Location: [facility] INVASIVE CV LAB;  Service: Cardiovascular;  Laterality: N/A;   LEFT HEART CATH AND CORS/GRAFTS ANGIOGRAPHY N/A 10/10/2022   Procedure: LEFT HEART CATH AND CORS/GRAFTS ANGIOGRAPHY;  Surgeon: Jordan, Peter M, MD;  Location: [facility] INVASIVE CV LAB;  Service: Cardiovascular;  Laterality: N/A;   LEFT HEART CATH AND CORS/GRAFTS ANGIOGRAPHY Left 03/29/2003  Procedure: LEFT HEART CATH AND CORS/GRAFTS ANGIOGRAPHY; Location: [facility]; Surgeon: Billy Jude, MD   LEFT HEART CATH AND CORS/GRAFTS ANGIOGRAPHY Left 03/12/2006   Procedure: LEFT HEART CATH AND CORS/GRAFTS ANGIOGRAPHY; Location: [facility]; Surgeon: Wolm Rhyme, MD   LEFT HEART CATH AND CORS/GRAFTS ANGIOGRAPHY Left 01/12/2007   Procedure: LEFT HEART CATH AND CORS/GRAFTS  ANGIOGRAPHY; Location: [facility]; Surgeon: Wolm Rhyme, MD   LEFT HEART CATH AND CORS/GRAFTS ANGIOGRAPHY Left 05/02/2010   Procedure: LEFT HEART CATH AND CORS/GRAFTS ANGIOGRAPHY; Location: [facility]; Surgeon: Marsa Dooms, MD    Allergies  No Known Allergies  History of Present Illness    Billy Cisneros. has a PMH of coronary artery disease status post CABG in 2000 (LIMA to LAD, RIMA to RCA, SVG to OM1, diagonal and ramus) underwent cardiac catheterization 10/2019 which showed patent LIMA-LAD, patent RIMA-PDA, total occluded distal left main, totally occluded ostial circumflex, totally occluded ostial LAD and normal LVEDP.  Medical management was recommended.  His PMH also includes HTN, anxiety, gout, and GERD.  He was seen in follow-up by Dr. Mona on 09/23/2019.  During that time he reported difficulty with chest discomfort.  He described exertional type chest pain with left arm heaviness.  His symptoms improved with rest.  It was noted that his LDL cholesterol was 62 but his triglycerides remained elevated at 320.  He underwent cardiac catheterization by Dr. Claudene 6/21.  At that time his Imdur  was uptitrated.  He presented to the clinic 10/30/20 for follow-up evaluation stated he had not noticed a difference in his chest discomfort with the addition of metoprolol .  He did not start his Imdur  medication.  He reported that with increased physical activity he noticed chest discomfort.  He gave an example of taking out his trash.  When he would rest after increased activity his chest discomfort would ease off and go away.  We reviewed his angiography and he and his wife expressed understanding.  He reported that he generally was fairly sedentary.  He also reported that he did not eat much fiber.  We reviewed his cholesterol panel and he reported that over the last several months he had not watched his diet as closely.  I had him start his Imdur , continue metoprolol , increase his physical activity,  start fiber supplementation, and planned follow-up in 6 months.    He presented to the clinic 09/12/21 for follow-up evaluation and stated he felt well.  He was unable to come to his last 2 appointments due to his daughter being in a car accident and then having flu.  We reviewed his current medication list and he is tolerating his medications well without side effects.  He did note some atypical chest discomfort at night when he laid down.  However, he denied exertional chest discomfort.  He had been walking on the treadmill daily and going to the [facility] 1 time per week.  He reported that he had been losing some weight.  I refilled his metoprolol , requested his updated lab work from his PCP and planned follow-up for 12 months.  He presented to the emergency department on 11/11/2021 with unstable angina.  He underwent left heart cath which showed severe coronary artery disease including subtotal chronic occlusion of the ostial LAD, proximal ramus, proximal circumflex and severe diffuse RCA disease.  He had a patent LIMA-LAD 30% stenosed, widely patent RIMA-RPDA, patent SVG-ramus, chronically occluded SVG-D.  Medical management was continued.  It was felt that running out of his isosorbide  mononitrate brought on his recurrent  angina.  He was discharged in stable condition on 11/12/2021.  He presents to the clinic 11/22/21 for follow-up evaluation stated that he had been off of his Imdur  for about 5 days.  He did not have a new prescription of sublingual nitroglycerin .  He reported that he had nitro spray in the past but never used it and his prescription was old.  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  He occasionally lifted heavier materials in and out of his fleeta.  He did report a fall out of bed 11/21/21.  He reported he rolled the wrong way.  We reviewed the importance of heart healthy low-sodium diet.  We reviewed his angiography and he expressed understanding.  I refilled his sublingual nitroglycerin ,  continued his current medication regimen, and plan follow-up in 4 to 6 months.  He was admitted to the hospital on 10/09/2022 and discharged on 10/11/2022.  He was noted to have NSTEMI.  His cardiac troponins trended up from 6-153, 441, and 461.  His EKG showed no acute ST changes.  He underwent cardiac catheterization on 10/10/2022.  He was not noted to have change from prior cardiac catheterization 7/23.  Medical management was continued.  His echocardiogram 10/10/2022 showed an EF of 55-60%, G1 DD, trivial mitral valve regurgitation.  He had presented with pain in his neck to [facility] ED.  CT spine showed no acute fracture but did note worsening moderate spinal stenosis along C4-C5 and severe bilateral neuroforaminal stenosis at C3-C4.  Recommendation to follow-up with neurosurgery was recommended.  He presented to the clinic 06/02/23 for follow-up evaluation and stated he had been doing well.  He was working part-time.  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  He did note occasionally that when he lifted a heavy parts bin he would get chest discomfort.  His chest discomfort was relieved with rest and occasionally he needed to take a sublingual nitroglycerin .  We reviewed his spring cardiac cath and echocardiogram.  He expressed understanding.  He continued to have cervical spine issues.  He had been prescribed physical therapy which he would be starting .   Follow-up in 6 to 9 months was planned.   He presented to the clinic 12/31/23 for follow-up evaluation and stated recently had been noticing more instances chest pain.  These came on with activity and were relieved with rest.  He reported that when he was working and lifting heavy parts bins he occasionally had to take multiple doses of nitroglycerin  throughout the day.  He was limited in his physical activity due to his spinal issues.  I ordered cardiac PET/CT and echocardiogram for further evaluation.  Will plan follow-up in 2 months.  Cardiac  PET/CT 02/11/2024 showed LV perfusion defect with moderate-severe reduction in uptake in the apical to mid anterior and anterior lateral locations that was partially reversible.  His EF was noted to be normal.  His findings were consistent with peri-infarct ischemia and his study was noted to be intermediate risk.  He followed up with Dr. Mona on 02/15/2024.  He was felt to be stable from a cardiac standpoint.  He noted that he was preparing for spinal surgery due to his cervical stenosis.  It was felt that he would be acceptable risk for spinal surgery.  Follow-up was planned for 3 to 4 months.  He is post laminectomy 02/12/2024.  He was admitted on 02/22/2024 and discharged on 02/27/2024.  He had a period of LOC change.  He underwent head  CT which showed no acute abnormalities.  He was diagnosed with aspiration pneumonia and noted to have coffee-ground emesis.  He completed a course of Augmentin and received 1 unit of PRBCs secondary to anemia.   He presents to the clinic today for follow-up evaluation and posthospital follow-up.  He states he noted chest pain yesterday that was nitro responsive.  We reviewed his recent surgery and hospitalization.  He and his wife expressed understanding.  He reports that during that time his Imdur  was decreased.  I will decrease his lisinopril  to 5 mg daily and to increase his Imdur  to 60 mg daily.  We will keep his follow-up appointment in January, have him continue to use his incentive spirometer, increase physical activity as tolerated, and continue heart healthy low-sodium diet.   Today he denies  shortness of breath, lower extremity edema,  palpitations, melena, hematuria, hemoptysis, diaphoresis, weakness, presyncope, syncope, orthopnea, and PND.     Home Medications    Prior to Admission medications   Medication Sig Start Date End Date Taking? Authorizing Provider  ALPRAZolam  (XANAX ) 0.5 MG tablet TAKE 1 TABLET BY MOUTH 3 TIMES A DAY AS NEEDED FOR  ANXIETY. Patient taking differently: Take 0.5 mg by mouth 3 (three) times daily as needed for anxiety. TAKE 1 TABLET BY MOUTH 3 TIMES A DAY AS NEEDED FOR ANXIETY. 06/24/17   Maree Leni Edyth DELENA, MD  aspirin  EC 81 MG tablet Take 1 tablet (81 mg total) by mouth daily. For heart attack prevention. 11/22/11   Rochel Aureliano DASEN, PA-C  buPROPion  (WELLBUTRIN  XL) 300 MG 24 hr tablet Take 1 tablet (300 mg total) by mouth daily. 06/24/17   Maree Leni Edyth DELENA, MD  HYDROcodone -acetaminophen  (NORCO/VICODIN) 5-325 MG tablet Take 1 tablet by mouth every 6 (six) hours as needed for moderate pain.  12/29/17   [provider]  icosapent  Ethyl (VASCEPA ) 1 g capsule Take 2 capsules (2 g total) by mouth 2 (two) times daily. 09/23/19   Hilty, Billy BROCKS, MD  isosorbide  mononitrate (IMDUR ) 30 MG 24 hr tablet Take 1 tablet (30 mg total) by mouth daily. 10/14/19 10/13/20  Claudene Victory ORN, MD  lisinopril  (PRINIVIL ,ZESTRIL ) 10 MG tablet Take 1 tablet (10 mg total) by mouth daily. 06/24/17   Maree Leni Edyth DELENA, MD  metoprolol  succinate (TOPROL  XL) 25 MG 24 hr tablet Take 1 tablet (25 mg total) by mouth daily. 10/14/19 10/13/20  Claudene Victory ORN, MD  Multiple Vitamins-Minerals (MULTIVITAMIN WITH MINERALS) tablet Take 1 tablet by mouth daily.    [provider]  omeprazole  (PRILOSEC) 20 MG capsule Take 1 capsule (20 mg total) by mouth 2 (two) times daily before a meal. 06/24/17   Maree Leni Edyth DELENA, MD  simvastatin  (ZOCOR ) 40 MG tablet Take 1 tablet (40 mg total) by mouth at bedtime. 06/24/17   Maree Leni Edyth DELENA, MD  valACYclovir  (VALTREX ) 500 MG tablet Take 500 mg by mouth 2 (two) times daily as needed (outbreak).     [provider]  vitamin B-12 (CYANOCOBALAMIN) 500 MCG tablet Take 500 mcg by mouth daily.    [provider]  zolpidem  (AMBIEN ) 10 MG tablet Take 1 tablet (10 mg total) by mouth at bedtime. Patient taking differently: Take 10 mg by mouth at bedtime as needed for sleep.  06/24/17   Maree Leni Edyth DELENA, MD     Family History    Family History  Problem Relation Age of Onset   Heart attack Mother    Hyperlipidemia Mother  Hypertension Mother    Lung cancer Father    Diabetes Sister    Diabetes Brother    Asthma Daughter    Cancer Maternal Grandmother    Stroke Maternal Grandfather    Heart attack Paternal Grandfather    He indicated that his mother is deceased. He indicated that his father is deceased. He indicated that his sister is alive. He indicated that his brother is alive. He indicated that his maternal grandmother is deceased. He indicated that his maternal grandfather is deceased. He indicated that his paternal grandmother is deceased. He indicated that his paternal grandfather is deceased. He indicated that his daughter is alive.   Social History    Social History   Socioeconomic History   Marital status: Married    Spouse name: Not on file   Number of children: Not on file   Years of education: Not on file   Highest education level: Not on file  Occupational History   Not on file  Tobacco Use   Smoking status: Never   Smokeless tobacco: Never  Vaping Use   Vaping status: Never Used  Substance and Sexual Activity   Alcohol  use: Yes    Comment: rare   Drug use: Not Currently    Frequency: 2.0 times per week    Types: Cocaine    Comment: last used in 2015   Sexual activity: Yes    Birth control/protection: None  Other Topics Concern   Not on file  Social History Narrative   Not on file   Social Drivers of Health   Financial Resource Strain: Low Risk  (03/02/2024)   Received from [facility]   Overall Financial Resource Strain (CARDIA)    Difficulty of Paying Living Expenses: Not hard at all  Food Insecurity: No Food Insecurity (03/02/2024)   Received from [facility]   Hunger Vital Sign    Within the past 12 months, you worried that your food would run out before you got the money to buy more.: Never true    Within  the past 12 months, the food you bought just didn't last and you didn't have money to get more.: Never true  Transportation Needs: No Transportation Needs (03/02/2024)   Received from [facility] - Transportation    In the past 12 months, has lack of transportation kept you from medical appointments or from getting medications?: No    Lack of Transportation (Non-Medical): No  Physical Activity: Not on file  Stress: Not on file  Social Connections: Patient Declined (02/22/2024)   Social Connection and Isolation Panel    Frequency of Communication with Friends and Family: Patient declined    Frequency of Social Gatherings with Friends and Family: Patient declined    Attends Religious Services: Patient declined    Database Administrator or Organizations: Patient declined    Attends Banker Meetings: Patient declined    Marital Status: Patient declined  Intimate Partner Violence: Not At Risk (02/22/2024)   Humiliation, Afraid, Rape, and Kick questionnaire    Fear of Current or Ex-Partner: No    Emotionally Abused: No    Physically Abused: No    Sexually Abused: No     Review of Systems    General:  No chills, fever, night sweats or weight changes.  Cardiovascular:  No chest pain, dyspnea on exertion, edema, orthopnea, palpitations, paroxysmal nocturnal dyspnea. Dermatological: No rash, lesions/masses Respiratory: No cough, dyspnea Urologic: No  hematuria, dysuria Abdominal:   No nausea, vomiting, diarrhea, bright red blood per rectum, melena, or hematemesis Neurologic:  No visual changes, wkns, changes in mental status. All other systems reviewed and are otherwise negative except as noted above.  Physical Exam    VS:  There were no vitals taken for this visit. , BMI There is no height or weight on file to calculate BMI. GEN: Well nourished, well developed, in no acute distress. HEENT: normal. Neck: Supple, no JVD, carotid bruits, or  masses. Cardiac: RRR, no murmurs, rubs, or gallops. No clubbing, cyanosis, edema.  Radials/DP/PT 2+ and equal bilaterally.  Respiratory:  Respirations regular and unlabored, clear to auscultation bilaterally. GI: Soft, nontender, nondistended, BS + x 4. MS: no deformity or atrophy. Skin: warm and dry, no rash. Neuro:  Strength and sensation are intact. Psych: Normal affect.  Accessory Clinical Findings    Recent Labs: 02/24/2024: ALT 26 02/27/2024: B Natriuretic Peptide 232.2; BUN 28; Creatinine, Ser 1.43; Hemoglobin 10.0; Magnesium  2.5; Platelets 231; Potassium 4.0; Sodium 144   Recent Lipid Panel    Component Value Date/Time   CHOL 130 10/10/2022 0941   CHOL 146 01/08/2015 0925   CHOL 141 08/06/2011 0327   TRIG 141 10/10/2022 0941   TRIG 375 (H) 08/06/2011 0327   HDL 43 10/10/2022 0941   HDL 42 01/08/2015 0925   HDL 25 (L) 08/06/2011 0327   CHOLHDL 3.0 10/10/2022 0941   VLDL 28 10/10/2022 0941   VLDL 75 (H) 08/06/2011 0327   LDLCALC 59 10/10/2022 0941   LDLCALC 76 01/08/2015 0925   LDLCALC 41 08/06/2011 0327    ECG personally reviewed by me today none today.   EKG 09/12/2021  normal sinus rhythm 80 bpm  Cardiac catheterization 10/14/2019 Patent saphenous vein graft to the ramus intermedius but occlusion occlusion of the saphenous vein graft limb to the obtuse marginal.   Patent LIMA to distal LAD Patent RIMA to PDA Totally occluded distal left main Totally occluded ostial circumflex Totally occluded ostial LAD Normal LV end-diastolic pressure.   RECOMMENDATIONS:   Toprol -XL 25 mg/day After 1 week if angina is still significant, add Imdur  30 mg/day. Further titrate anti-ischemic therapy until relief of symptoms. No interventional targets.    Diagnostic Dominance: Right    Intervention   Cardiac Catheterization: 10/10/2022    Ost LM to Dist LM lesion is 50% stenosed.   Mid LAD lesion is 100% stenosed.   Ost LAD to Mid LAD lesion is 99% stenosed.    Ost Cx to Prox Cx lesion is 99% stenosed.   Origin lesion is 30% stenosed.   Origin to Insertion lesion between Ramus and 3rd Mrg  is 100% stenosed.   Origin to Insertion lesion is 100% stenosed.   3rd Mrg lesion is 100% stenosed.   LIMA and is small.   SVG and is normal in caliber.   SVG.   LV end diastolic pressure is normal.   Severe 3 vessel obstructive CAD Patent LIMA to the LAD Patent RIMA to the PDA Patent SVG to the ramus intermedius Normal LVEDP   Plan: no change compared to prior cardiac cath in July 2023. Continue medical management.  Cardiac PET/CT 02/11/24    LV perfusion is abnormal. Defect 1: There is a medium defect with moderate to severe reduction in uptake present in the apical to mid anterior and anterolateral location(s) that is partially reversible. There is abnormal wall motion in the defect area. Consistent with infarction and peri-infarct ischemia.   Rest  left ventricular function is normal. Rest EF: 67%. Stress left ventricular function is normal. Stress EF: 68%. End diastolic cavity size is normal.   Myocardial blood flow reserve is not reported in this patient due to technical or patient-specific concerns that affect accuracy.   Coronary calcium  assessment not performed due to prior revascularization.   Findings are consistent with infarction with peri-infarct ischemia. The study is intermediate risk.   Electronically signed by: Lonni Hanson, MD.  Assessment & Plan   1.  Coronary artery disease-no chest pain today.  Notes an episode of chest discomfort yesterday that was nitro responsive.  Denies exertional chest discomfort.  Noted to have mildly elevated cardiac troponins during recent admission.  It was felt to be related to demand ischemia in the setting of anemia. Continue aspirin , Imdur , lisinopril , metoprolol , omega-3 fatty acids, rosuvastatin , sublingual nitroglycerin  as needed Heart healthy low-sodium diet-reviewed Continue to slowly increase  physical activity. Decrease lisinopril  to 5 mg daily Increase Imdur  to 60 mg daily Order CBC, BMP  Essential hypertension-BP today 112/64.  Was noted to be hypotensive while in the hospital.  He required a short hold of his antihypertensive medications. Decrease lisinopril  to 5 mg daily Increase Imdur  to 60 mg daily Continue to maintain blood pressure log  Hyperlipidemia-LDL 59 on 10/10/22.   Continue Lovaza , Crestor , omega-3 fatty acids Heart healthy low-sodium high-fiber diet Follow any PCP   Spinal stenosis-status post laminectomy 02/12/2024.  Admitted on 02/22/2024 and discharged on 02/27/2024.  He had a period of LOC change.  He underwent head CT which showed no acute abnormalities.  He was diagnosed with aspiration pneumonia and noted to have coffee-ground emesis.  He completed a course of Augmentin and received 1 unit of PRBCs secondary to anemia.   Following with neurosurgery Continue physical therapy exercises  Disposition: Follow-up with me as scheduled in January.  Josefa HERO. Reem Fleury NP-C    03/20/2024, 12:49 PM [facility] [address] Office [phone] Fax [phone]    I spent 14  minutes examining this patient, reviewing medications, and using patient centered shared decision making involving their cardiac care.  Prior to their visit I spent greater than 20 minutes reviewing  past medical history,  medications, and prior cardiac tests.

## 2024-03-21 ENCOUNTER — Ambulatory Visit: Attending: General Practice | Admitting: General Practice

## 2024-03-21 ENCOUNTER — Encounter: Payer: Self-pay | Admitting: General Practice

## 2024-03-21 ENCOUNTER — Ambulatory Visit: Admitting: General Practice

## 2024-03-21 VITALS — BP 112/64 | HR 73 | Ht 65.0 in | Wt 181.0 lb

## 2024-03-21 DIAGNOSIS — I10 Essential (primary) hypertension: Secondary | ICD-10-CM

## 2024-03-21 DIAGNOSIS — I2510 Atherosclerotic heart disease of native coronary artery without angina pectoris: Secondary | ICD-10-CM | POA: Diagnosis not present

## 2024-03-21 DIAGNOSIS — E785 Hyperlipidemia, unspecified: Secondary | ICD-10-CM | POA: Diagnosis not present

## 2024-03-21 DIAGNOSIS — M4800 Spinal stenosis, site unspecified: Secondary | ICD-10-CM | POA: Diagnosis not present

## 2024-03-21 LAB — CBC

## 2024-03-21 MED ORDER — ISOSORBIDE MONONITRATE ER 60 MG PO TB24: 60.0000 mg | ORAL_TABLET | Freq: Every day | ORAL | 1 refills | Status: AC

## 2024-03-21 MED ORDER — LISINOPRIL 5 MG PO TABS: 5.0000 mg | ORAL_TABLET | Freq: Every day | ORAL | 1 refills | Status: AC

## 2024-03-21 NOTE — Patient Instructions (Addendum)
 Medication Instructions:  DECREASE Lisinopril  5mg  Take 1 tablet once a day  INCREASE Imdur  (isosorbide ) to 60mg  Take 1 tablet once a day   CONTINUE YOUR SPIROMETER  *If you need a refill on your cardiac medications before your next appointment, please call your pharmacy*  Lab Work: TODAY-BMET & CBC If you have labs (blood work) drawn today and your tests are completely normal, you will receive your results only by: MyChart Message (if you have MyChart) OR A paper copy in the mail If you have any lab test that is abnormal or we need to change your treatment, we will call you to review the results.  Testing/Procedures: NONE ORDERED  Follow-Up: At [facility], you and your health needs are our priority.  As part of our continuing mission to provide you with exceptional heart care, our providers are all part of one team.  This team includes your primary Cardiologist (physician) and Advanced Practice Providers or APPs (Physician Assistants and Nurse Practitioners) who all work together to provide you with the care you need, when you need it.  Your next appointment:    FOLLOW UP AS SCHEDULED  Provider:   Josefa Beauvais, NP        We recommend signing up for the patient portal called MyChart.  Sign up information is provided on this After Visit Summary.  MyChart is used to connect with patients for Virtual Visits (Telemedicine).  Patients are able to view lab/test results, encounter notes, upcoming appointments, etc.  Non-urgent messages can be sent to your provider as well.   To learn more about what you can do with MyChart, go to [email or web address].   Other Instructions Exercise regularly as told by your doctor. Make sure to weight daily and keep a weight log.   Moderate-intensity exercise is any activity that gets you moving enough to burn at least three times more energy (calories) than if you were sitting. Examples of moderate exercise include: Walking a mile in 15  minutes. Doing light yard work. Biking at an easy pace. Most people should get at least 150 minutes of moderate-intensity exercise a week to maintain their body weight.  Increase your water intake: Maintain hydration

## 2024-03-22 ENCOUNTER — Ambulatory Visit: Admitting: Physician Assistant

## 2024-03-22 ENCOUNTER — Ambulatory Visit: Payer: Self-pay | Admitting: General Practice

## 2024-03-22 ENCOUNTER — Ambulatory Visit: Admitting: Urology

## 2024-03-22 LAB — BASIC METABOLIC PANEL WITH GFR
BUN/Creatinine Ratio: 10 (ref 10–24)
BUN: 15 mg/dL (ref 8–27)
CO2: 21 mmol/L (ref 20–29)
Calcium: 9.5 mg/dL (ref 8.6–10.2)
Chloride: 105 mmol/L (ref 96–106)
Creatinine, Ser: 1.53 mg/dL — ABNORMAL HIGH (ref 0.76–1.27)
Glucose: 99 mg/dL (ref 70–99)
Potassium: 4.6 mmol/L (ref 3.5–5.2)
Sodium: 143 mmol/L (ref 134–144)
eGFR: 48 mL/min/1.73 — ABNORMAL LOW (ref >59)

## 2024-03-22 LAB — CBC
Hematocrit: 36.5 % — AB (ref 37.5–51.0)
Hemoglobin: 11.6 g/dL — AB (ref 13.0–17.7)
MCH: 30.2 pg (ref 26.6–33.0)
MCHC: 31.8 g/dL (ref 31.5–35.7)
MCV: 95 fL (ref 79–97)
Platelets: 272 x10E3/uL (ref 150–450)
RBC: 3.84 x10E6/uL — AB (ref 4.14–5.80)
RDW: 13.8 % (ref 11.6–15.4)
WBC: 5.8 x10E3/uL (ref 3.4–10.8)

## 2024-03-24 ENCOUNTER — Telehealth: Payer: Self-pay | Admitting: Orthopedic Surgery

## 2024-03-24 NOTE — Telephone Encounter (Signed)
 It is not uncommon to have flare ups of pain after surgery.   If he is having neck pain and spasms, I recommend taking the robaxin  as needed. He can also restart OTC tylenol  (max of 3000mg  in 24 hours).   He needs to keep his scheduled postop visit.

## 2024-03-24 NOTE — Telephone Encounter (Signed)
 Patient advised. He will keep his appointment on 03/30/24-that was scheduled for increased pain follow up and if he starts to feel better next week he will let us  know and cancel that appointment. His routine Post-op is on 04/12/24

## 2024-03-24 NOTE — Telephone Encounter (Signed)
 Patient was seen on 03/08/24 for post op and was improving with pain and walking. Patient states he has continued doing well but then on Tuesday 03/22/2024 while he was walking at [facility] for a bit he noticed right side neck pain and right upper and back shoulder was starting to hurt way more. The more he walked the more intense pain became. He had Robaxin  500 mg tablets left so he took 1 that day as he was having some spasms in his neck and shoulder also and that helped relieve it. He noticed same pain yesterday and today, the more he moves around the more intense pain comes on. If he stays still and resting he feels better. He took Robaxin  2 tablets yesterday and it helped some, he has a head rest collar (U shape) that he tried and that helped a lot with his neck pain.he describes it as constant ache sensation. No numbness, tingling, weakness in arms. He wonders if he just slept wrong. He has not tried anything else until he knows what he can try to take or use from us . And does he need to keep his appointment that was scheduled now for 03/30/2024?

## 2024-03-24 NOTE — Telephone Encounter (Signed)
 Pt states that they are having a issue with pains in their neck & walking. I did sch a f/u with Stacy for Wednesday but they are wanting to know in the meantime can they use a heating pad or what else you recommend them to do?

## 2024-03-30 ENCOUNTER — Ambulatory Visit: Admitting: Orthopedic Surgery

## 2024-04-05 ENCOUNTER — Encounter: Admitting: Neurosurgery

## 2024-04-12 ENCOUNTER — Encounter: Payer: Self-pay | Admitting: Neurosurgery

## 2024-04-12 ENCOUNTER — Ambulatory Visit: Admitting: Neurosurgery

## 2024-04-12 VITALS — BP 130/76 | Temp 98.3°F | Ht 65.0 in | Wt 179.1 lb

## 2024-04-12 DIAGNOSIS — M4802 Spinal stenosis, cervical region: Secondary | ICD-10-CM

## 2024-04-12 DIAGNOSIS — Z09 Encounter for follow-up examination after completed treatment for conditions other than malignant neoplasm: Secondary | ICD-10-CM

## 2024-04-12 DIAGNOSIS — G959 Disease of spinal cord, unspecified: Secondary | ICD-10-CM

## 2024-04-12 NOTE — Progress Notes (Signed)
   REFERRING PHYSICIAN:  [name], Md [address] [facility] [place],  [place] [address]  DOS: 02/22/24  C3-C7 laminoplasty   HISTORY OF PRESENT ILLNESS: Billy Cisneros. is status post above surgery.  He is doing very well.  He has recovered from the medical events after his surgery.    PHYSICAL EXAMINATION:  NEUROLOGICAL:  General: In no acute distress.   Awake, alert, oriented to person, place, and time.  Pupils equal round and reactive to light.  Facial tone is symmetric.    Strength: Side Biceps Triceps Deltoid Interossei Grip Wrist Ext. Wrist Flex.  R 5 5 5 5 5 5 5   L 5 5 5 5 5 5 5    Incision c/d/i  Imaging:  Nothing new to review.   Assessment / Plan: Erikson Danzy. is doing well s/p above surgery.   We reviewed his activity level.  He will begin increasing his activity.  He will discuss with work about when he can return.  I am very pleased with his improvements.  Will see him back in clinic in 6 weeks.    Reeves Daisy MD Dept of Neurosurgery

## 2024-05-13 NOTE — Progress Notes (Signed)
" ° °  REFERRING PHYSICIAN:  [name], Md [address] [facility] [place],  [place] [address]  DOS: 02/22/24  C3-C7 laminoplasty   HISTORY OF PRESENT ILLNESS:  He was doing very well at his last visit with Dr. Clois.   He continues with stiffness in his neck. He notes intermittent left sided neck spasms/tightness. He feels like he shuffles his feet sometimes when he walks. Overall is doing well.   He would like a refill of robaxin  to have to use prn.    PHYSICAL EXAMINATION:  NEUROLOGICAL:  General: In no acute distress.   Awake, alert, oriented to person, place, and time.  Pupils equal round and reactive to light.  Facial tone is symmetric.    Strength: Side Biceps Triceps Deltoid Interossei Grip Wrist Ext. Wrist Flex.  R 5 5 5 5 5 5 5   L 5 5 5 5 5 5 5    Side Iliopsoas Quads Hamstring PF DF EHL  R 5 5 5 5 5 5   L 5 5 5 5 5 5     Incision well healed.   Reflexes are 2+ and symmetric at the biceps, brachioradialis, patella and achilles.   Hoffman's is absent.  Clonus is not present.    Imaging:  Nothing new to review.   Assessment / Plan: Paz Winsett. is doing well s/p above surgery. Treatment options reviewed with patient and following plan made:   - He can slowly return back to activity as tolerated.  - Refill given on robaxin  to use prn. Reviewed dosing and side effects.  - Discussed referral to PT. He declines for now. He will let me know if walking gets worse.  - He will follow up with me in 3 months and prn. Can cancel if doing well.   Advised to contact the office if any questions or concerns arise.   Glade Boys PA-C Dept of Neurosurgery  "

## 2024-05-17 ENCOUNTER — Encounter: Payer: Self-pay | Admitting: Orthopedic Surgery

## 2024-05-17 ENCOUNTER — Telehealth: Payer: Self-pay | Admitting: Orthopedic Surgery

## 2024-05-17 ENCOUNTER — Ambulatory Visit: Admitting: Orthopedic Surgery

## 2024-05-17 VITALS — BP 136/84 | Temp 98.1°F | Ht 65.0 in | Wt 179.0 lb

## 2024-05-17 DIAGNOSIS — G959 Disease of spinal cord, unspecified: Secondary | ICD-10-CM

## 2024-05-17 DIAGNOSIS — Z09 Encounter for follow-up examination after completed treatment for conditions other than malignant neoplasm: Secondary | ICD-10-CM

## 2024-05-17 MED ORDER — METHOCARBAMOL 500 MG PO TABS: 500.0000 mg | ORAL_TABLET | Freq: Three times a day (TID) | ORAL | 0 refills | Status: AC | PRN

## 2024-05-17 NOTE — Telephone Encounter (Signed)
 Okay for 3 months for placard. I filled it out and gave to [facility].

## 2024-05-17 NOTE — Telephone Encounter (Signed)
 Patient called and forgot to ask at his appointment if Glade if she would renew his handicap placard. Please advise.

## 2024-05-17 NOTE — Telephone Encounter (Signed)
 Patient notified the form has been filled out and is up front to pick up.

## 2024-05-18 NOTE — Progress Notes (Unsigned)
 "  Cardiology Clinic Note   Patient Name: Billy Cisneros. Date of Encounter: 05/18/2024  Primary Care Provider:  [name], MD Primary Cardiologist:  Billy JAYSON Maxcy, MD  Patient Profile    Billy Cisneros. 74 year old male presents the clinic today for follow-up evaluation of his coronary artery disease .  Past Medical History    Past Medical History:  Diagnosis Date   Anxiety    a.) on BZO (alprazolam ) PRN   Cervical myelopathy (HCC)    CKD (chronic kidney disease) stage 3, GFR 30-59 ml/min (HCC)    Coronary artery disease 03/15/1999   a.) LHC 03/15/1999: multi-vessel CAD --> CVTS; b.) s/p 5v CABG 03/18/1999; c.) LHC 03/29/2003, 03/12/2006, 01/12/2007, 05/02/2010 - MV Dz with patent bypass grafts x 5; d.) LHC 10/14/2019: MV Dz with CTO OM1; e.) LHC 11/11/2021: MV Dz with CTO SVG-OM1 and SVG-D1; f.) NSTEMI 10/09/2022 --> LHC 10/10/2022: MV Dz with CTO OM1 and SVG D1 - med mgmt   DDD (degenerative disc disease), thoracolumbar    Depression    Diverticulosis    Duodenal diverticulum    GERD (gastroesophageal reflux disease)    Gout    Hepatic steatosis    Hiatal hernia    History of cocaine abuse (HCC)    last used in 2015 per pt (as of 02-15-24)   Hyperlipidemia    Hypertension    Hypothyroidism    Insomnia    a.) on hypnotic (zolpidem ) PRN   Long-term use of aspirin  therapy    Nausea and vomiting 02/24/2024   NSTEMI (non-ST elevated myocardial infarction) (HCC) 10/09/2022   S/P CABG x 5 03/18/1999   a.) LIMA-LAD, RIMA-RCA, SVG-OM1, SVG-D1, SVG-RI   Spinal stenosis of cervical region with radiculopathy    Umbilical hernia    Unstable angina (HCC) 11/11/2021   Past Surgical History:  Procedure Laterality Date   CERVICAL LAMINOPLASTY N/A 02/22/2024   Procedure: CERVICAL LAMINOPLASTY;  Surgeon: Clois Fret, MD;  Location: [facility] ORS;  Service: Neurosurgery;  Laterality: N/A;  C3-7 LAMINOPLASTY   COLONOSCOPY WITH PROPOFOL  N/A 05/25/2017    Procedure: COLONOSCOPY WITH PROPOFOL ;  Surgeon: Therisa Bi, MD;  Location: [facility] ENDOSCOPY;  Service: Gastroenterology;  Laterality: N/A;   CORONARY ARTERY BYPASS GRAFT N/A 03/18/1999   Procedure: CORONARY ARTERY BYPASS GRAFT; Location: [place]   ESOPHAGOGASTRODUODENOSCOPY N/A 10/27/2017   Procedure: ESOPHAGOGASTRODUODENOSCOPY (EGD);  Surgeon: Unk Corinn Skiff, MD;  Location: [facility] ENDOSCOPY;  Service: Gastroenterology;  Laterality: N/A;   ESOPHAGOGASTRODUODENOSCOPY (EGD) WITH PROPOFOL  N/A 11/19/2017   Procedure: ESOPHAGOGASTRODUODENOSCOPY (EGD) WITH PROPOFOL ;  Surgeon: Unk Corinn Skiff, MD;  Location: [facility] ENDOSCOPY;  Service: Gastroenterology;  Laterality: N/A;   LEFT HEART CATH AND CORONARY ANGIOGRAPHY Left 03/15/1999   Procedure: LEFT HEART CATH AND CORONARY ANGIOGRAPHY; Location: [facility]   LEFT HEART CATH AND CORS/GRAFTS ANGIOGRAPHY N/A 10/14/2019   Procedure: LEFT HEART CATH AND CORS/GRAFTS ANGIOGRAPHY;  Surgeon: Claudene Victory ORN, MD;  Location: [facility] INVASIVE CV LAB;  Service: Cardiovascular;  Laterality: N/A;   LEFT HEART CATH AND CORS/GRAFTS ANGIOGRAPHY N/A 11/11/2021   Procedure: LEFT HEART CATH AND CORS/GRAFTS ANGIOGRAPHY;  Surgeon: Mady Bruckner, MD;  Location: [facility] INVASIVE CV LAB;  Service: Cardiovascular;  Laterality: N/A;   LEFT HEART CATH AND CORS/GRAFTS ANGIOGRAPHY N/A 10/10/2022   Procedure: LEFT HEART CATH AND CORS/GRAFTS ANGIOGRAPHY;  Surgeon: Jordan, Peter M, MD;  Location: [facility] INVASIVE CV LAB;  Service: Cardiovascular;  Laterality: N/A;   LEFT HEART CATH AND CORS/GRAFTS ANGIOGRAPHY Left 03/29/2003  Procedure: LEFT HEART CATH AND CORS/GRAFTS ANGIOGRAPHY; Location: [facility]; Surgeon: Billy Jude, MD   LEFT HEART CATH AND CORS/GRAFTS ANGIOGRAPHY Left 03/12/2006   Procedure: LEFT HEART CATH AND CORS/GRAFTS ANGIOGRAPHY; Location: [facility]; Surgeon: Wolm Rhyme, MD   LEFT HEART CATH AND CORS/GRAFTS ANGIOGRAPHY Left 01/12/2007   Procedure: LEFT HEART CATH AND CORS/GRAFTS  ANGIOGRAPHY; Location: [facility]; Surgeon: Wolm Rhyme, MD   LEFT HEART CATH AND CORS/GRAFTS ANGIOGRAPHY Left 05/02/2010   Procedure: LEFT HEART CATH AND CORS/GRAFTS ANGIOGRAPHY; Location: [facility]; Surgeon: Marsa Dooms, MD    Allergies  No Known Allergies  History of Present Illness    Billy Cisneros. has a PMH of coronary artery disease status post CABG in 2000 (LIMA to LAD, RIMA to RCA, SVG to OM1, diagonal and ramus) underwent cardiac catheterization 10/2019 which showed patent LIMA-LAD, patent RIMA-PDA, total occluded distal left main, totally occluded ostial circumflex, totally occluded ostial LAD and normal LVEDP.  Medical management was recommended.  His PMH also includes HTN, anxiety, gout, and GERD.  He was seen in follow-up by Dr. Mona on 09/23/2019.  During that time he reported difficulty with chest discomfort.  He described exertional type chest pain with left arm heaviness.  His symptoms improved with rest.  It was noted that his LDL cholesterol was 62 but his triglycerides remained elevated at 320.  He underwent cardiac catheterization by Dr. Claudene 6/21.  At that time his Imdur  was uptitrated.  He presented to the clinic 10/30/20 for follow-up evaluation stated he had not noticed a difference in his chest discomfort with the addition of metoprolol .  He did not start his Imdur  medication.  He reported that with increased physical activity he noticed chest discomfort.  He gave an example of taking out his trash.  When he would rest after increased activity his chest discomfort would ease off and go away.  We reviewed his angiography and he and his wife expressed understanding.  He reported that he generally was fairly sedentary.  He also reported that he did not eat much fiber.  We reviewed his cholesterol panel and he reported that over the last several months he had not watched his diet as closely.  I had him start his Imdur , continue metoprolol , increase his physical activity,  start fiber supplementation, and planned follow-up in 6 months.    He presented to the clinic 09/12/21 for follow-up evaluation and stated he felt well.  He was unable to come to his last 2 appointments due to his daughter being in a car accident and then having flu.  We reviewed his current medication list and he is tolerating his medications well without side effects.  He did note some atypical chest discomfort at night when he laid down.  However, he denied exertional chest discomfort.  He had been walking on the treadmill daily and going to the [facility] 1 time per week.  He reported that he had been losing some weight.  I refilled his metoprolol , requested his updated lab work from his PCP and planned follow-up for 12 months.  He presented to the emergency department on 11/11/2021 with unstable angina.  He underwent left heart cath which showed severe coronary artery disease including subtotal chronic occlusion of the ostial LAD, proximal ramus, proximal circumflex and severe diffuse RCA disease.  He had a patent LIMA-LAD 30% stenosed, widely patent RIMA-RPDA, patent SVG-ramus, chronically occluded SVG-D.  Medical management was continued.  It was felt that running out of his isosorbide  mononitrate brought on his recurrent  angina.  He was discharged in stable condition on 11/12/2021.  He presents to the clinic 11/22/21 for follow-up evaluation stated that he had been off of his Imdur  for about 5 days.  He did not have a new prescription of sublingual nitroglycerin .  He reported that he had nitro spray in the past but never used it and his prescription was old.  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  He occasionally lifted heavier materials in and out of his fleeta.  He did report a fall out of bed 11/21/21.  He reported he rolled the wrong way.  We reviewed the importance of heart healthy low-sodium diet.  We reviewed his angiography and he expressed understanding.  I refilled his sublingual nitroglycerin ,  continued his current medication regimen, and plan follow-up in 4 to 6 months.  He was admitted to the hospital on 10/09/2022 and discharged on 10/11/2022.  He was noted to have NSTEMI.  His cardiac troponins trended up from 6-153, 441, and 461.  His EKG showed no acute ST changes.  He underwent cardiac catheterization on 10/10/2022.  He was not noted to have change from prior cardiac catheterization 7/23.  Medical management was continued.  His echocardiogram 10/10/2022 showed an EF of 55-60%, G1 DD, trivial mitral valve regurgitation.  He had presented with pain in his neck to [facility] ED.  CT spine showed no acute fracture but did note worsening moderate spinal stenosis along C4-C5 and severe bilateral neuroforaminal stenosis at C3-C4.  Recommendation to follow-up with neurosurgery was recommended.  He presented to the clinic 06/02/23 for follow-up evaluation and stated he had been doing well.  He was working part-time.  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  He did note occasionally that when he lifted a heavy parts bin he would get chest discomfort.  His chest discomfort was relieved with rest and occasionally he needed to take a sublingual nitroglycerin .  We reviewed his spring cardiac cath and echocardiogram.  He expressed understanding.  He continued to have cervical spine issues.  He had been prescribed physical therapy which he would be starting .   Follow-up in 6 to 9 months was planned.   He presented to the clinic 12/31/23 for follow-up evaluation and stated recently had been noticing more instances chest pain.  These came on with activity and were relieved with rest.  He reported that when he was working and lifting heavy parts bins he occasionally had to take multiple doses of nitroglycerin  throughout the day.  He was limited in his physical activity due to his spinal issues.  I ordered cardiac PET/CT and echocardiogram for further evaluation.  Will plan follow-up in 2 months.  Cardiac  PET/CT 02/11/2024 showed LV perfusion defect with moderate-severe reduction in uptake in the apical to mid anterior and anterior lateral locations that was partially reversible.  His EF was noted to be normal.  His findings were consistent with peri-infarct ischemia and his study was noted to be intermediate risk.  He followed up with Dr. Mona on 02/15/2024.  He was felt to be stable from a cardiac standpoint.  He noted that he was preparing for spinal surgery due to his cervical stenosis.  It was felt that he would be acceptable risk for spinal surgery.  Follow-up was planned for 3 to 4 months.  He is post laminectomy 02/12/2024.  He was admitted on 02/22/2024 and discharged on 02/27/2024.  He had a period of LOC change.  He underwent head  CT which showed no acute abnormalities.  He was diagnosed with aspiration pneumonia and noted to have coffee-ground emesis.  He completed a course of Augmentin  and received 1 unit of PRBCs secondary to anemia.   He presented to the clinic 03/21/24 for follow-up evaluation and posthospital follow-up.  He stated he noted chest pain yesterday that was nitro responsive.  We reviewed his recent surgery and hospitalization.  He and his wife expressed understanding.  He reported that during that time his Imdur  was decreased.  I decreased his lisinopril  to 5 mg daily and to increased his Imdur  to 60 mg daily.  Follow-up in January as planned.  He presents to the clinic today for follow-up evaluation states***.  Today he denies  shortness of breath, lower extremity edema,  palpitations, melena, hematuria, hemoptysis, diaphoresis, weakness, presyncope, syncope, orthopnea, and PND.     Home Medications    Prior to Admission medications   Medication Sig Start Date End Date Taking? Authorizing Provider  ALPRAZolam  (XANAX ) 0.5 MG tablet TAKE 1 TABLET BY MOUTH 3 TIMES A DAY AS NEEDED FOR ANXIETY. Patient taking differently: Take 0.5 mg by mouth 3 (three) times daily as needed  for anxiety. TAKE 1 TABLET BY MOUTH 3 TIMES A DAY AS NEEDED FOR ANXIETY. 06/24/17   Maree Leni Edyth DELENA, MD  aspirin  EC 81 MG tablet Take 1 tablet (81 mg total) by mouth daily. For heart attack prevention. 11/22/11   Rochel Aureliano DASEN, PA-C  buPROPion  (WELLBUTRIN  XL) 300 MG 24 hr tablet Take 1 tablet (300 mg total) by mouth daily. 06/24/17   Maree Leni Edyth DELENA, MD  HYDROcodone -acetaminophen  (NORCO/VICODIN) 5-325 MG tablet Take 1 tablet by mouth every 6 (six) hours as needed for moderate pain.  12/29/17   [provider]  icosapent  Ethyl (VASCEPA ) 1 g capsule Take 2 capsules (2 g total) by mouth 2 (two) times daily. 09/23/19   Hilty, Billy BROCKS, MD  isosorbide  mononitrate (IMDUR ) 30 MG 24 hr tablet Take 1 tablet (30 mg total) by mouth daily. 10/14/19 10/13/20  Claudene Victory ORN, MD  lisinopril  (PRINIVIL ,ZESTRIL ) 10 MG tablet Take 1 tablet (10 mg total) by mouth daily. 06/24/17   Maree Leni Edyth DELENA, MD  metoprolol  succinate (TOPROL  XL) 25 MG 24 hr tablet Take 1 tablet (25 mg total) by mouth daily. 10/14/19 10/13/20  Claudene Victory ORN, MD  Multiple Vitamins-Minerals (MULTIVITAMIN WITH MINERALS) tablet Take 1 tablet by mouth daily.    [provider]  omeprazole  (PRILOSEC) 20 MG capsule Take 1 capsule (20 mg total) by mouth 2 (two) times daily before a meal. 06/24/17   Maree Leni Edyth DELENA, MD  simvastatin  (ZOCOR ) 40 MG tablet Take 1 tablet (40 mg total) by mouth at bedtime. 06/24/17   Maree Leni Edyth DELENA, MD  valACYclovir  (VALTREX ) 500 MG tablet Take 500 mg by mouth 2 (two) times daily as needed (outbreak).     [provider]  vitamin B-12 (CYANOCOBALAMIN ) 500 MCG tablet Take 500 mcg by mouth daily.    [provider]  zolpidem  (AMBIEN ) 10 MG tablet Take 1 tablet (10 mg total) by mouth at bedtime. Patient taking differently: Take 10 mg by mouth at bedtime as needed for sleep.  06/24/17   Maree Leni Edyth DELENA, MD    Family History    Family History  Problem Relation Age of Onset   Heart attack  Mother    Hyperlipidemia Mother    Hypertension Mother    Lung cancer Father  Diabetes Sister    Diabetes Brother    Asthma Daughter    Cancer Maternal Grandmother    Stroke Maternal Grandfather    Heart attack Paternal Grandfather    He indicated that his mother is deceased. He indicated that his father is deceased. He indicated that his sister is alive. He indicated that his brother is alive. He indicated that his maternal grandmother is deceased. He indicated that his maternal grandfather is deceased. He indicated that his paternal grandmother is deceased. He indicated that his paternal grandfather is deceased. He indicated that his daughter is alive.   Social History    Social History   Socioeconomic History   Marital status: Married    Spouse name: Not on file   Number of children: Not on file   Years of education: Not on file   Highest education level: Not on file  Occupational History   Not on file  Tobacco Use   Smoking status: Never   Smokeless tobacco: Never  Vaping Use   Vaping status: Never Used  Substance and Sexual Activity   Alcohol  use: Yes    Comment: rare   Drug use: Not Currently    Frequency: 2.0 times per week    Types: Cocaine    Comment: last used in 2015   Sexual activity: Yes    Birth control/protection: None  Other Topics Concern   Not on file  Social History Narrative   Not on file   Social Drivers of Health   Tobacco Use: Low Risk (05/17/2024)   Patient History    Smoking Tobacco Use: Never    Smokeless Tobacco Use: Never    Passive Exposure: Not on file  Financial Resource Strain: Low Risk  (03/02/2024)   Received from [facility]   Overall Financial Resource Strain (CARDIA)    Difficulty of Paying Living Expenses: Not hard at all  Food Insecurity: No Food Insecurity (03/02/2024)   Received from [facility]   Epic    Within the past 12 months, you worried that your food would run out before you  got the money to buy more.: Never true    Within the past 12 months, the food you bought just didn't last and you didn't have money to get more.: Never true  Transportation Needs: No Transportation Needs (03/02/2024)   Received from [facility] - Transportation    In the past 12 months, has lack of transportation kept you from medical appointments or from getting medications?: No    Lack of Transportation (Non-Medical): No  Physical Activity: Not on file  Stress: Not on file  Social Connections: Patient Declined (02/22/2024)   Social Connection and Isolation Panel    Frequency of Communication with Friends and Family: Patient declined    Frequency of Social Gatherings with Friends and Family: Patient declined    Attends Religious Services: Patient declined    Database Administrator or Organizations: Patient declined    Attends Banker Meetings: Patient declined    Marital Status: Patient declined  Intimate Partner Violence: Not At Risk (02/22/2024)   Epic    Fear of Current or Ex-Partner: No    Emotionally Abused: No    Physically Abused: No    Sexually Abused: No  Depression (PHQ2-9): Not on file  Alcohol  Screen: Not on file  Housing: Low Risk  (03/02/2024)   Received from [facility]   Epic  In the last 12 months, was there a time when you were not able to pay the mortgage or rent on time?: No    In the past 12 months, how many times have you moved where you were living?: 0    At any time in the past 12 months, were you homeless or living in a shelter (including now)?: No  Utilities: Not At Risk (03/02/2024)   Received from [facility]   Epic    In the past 12 months has the electric, gas, oil, or water company threatened to shut off services in your home?: No  Health Literacy: Not on file     Review of Systems    General:  No chills, fever, night sweats or weight changes.  Cardiovascular:  No  chest pain, dyspnea on exertion, edema, orthopnea, palpitations, paroxysmal nocturnal dyspnea. Dermatological: No rash, lesions/masses Respiratory: No cough, dyspnea Urologic: No hematuria, dysuria Abdominal:   No nausea, vomiting, diarrhea, bright red blood per rectum, melena, or hematemesis Neurologic:  No visual changes, wkns, changes in mental status. All other systems reviewed and are otherwise negative except as noted above.  Physical Exam    VS:  There were no vitals taken for this visit. , BMI There is no height or weight on file to calculate BMI. GEN: Well nourished, well developed, in no acute distress. HEENT: normal. Neck: Supple, no JVD, carotid bruits, or masses. Cardiac: RRR, no murmurs, rubs, or gallops. No clubbing, cyanosis, edema.  Radials/DP/PT 2+ and equal bilaterally.  Respiratory:  Respirations regular and unlabored, clear to auscultation bilaterally. GI: Soft, nontender, nondistended, BS + x 4. MS: no deformity or atrophy. Skin: warm and dry, no rash. Neuro:  Strength and sensation are intact. Psych: Normal affect.  Accessory Clinical Findings    Recent Labs: 02/24/2024: ALT 26 02/27/2024: B Natriuretic Peptide 232.2; Magnesium  2.5 03/21/2024: BUN 15; Creatinine, Ser 1.53; Hemoglobin 11.6; Platelets 272; Potassium 4.6; Sodium 143   Recent Lipid Panel    Component Value Date/Time   CHOL 130 10/10/2022 0941   CHOL 146 01/08/2015 0925   CHOL 141 08/06/2011 0327   TRIG 141 10/10/2022 0941   TRIG 375 (H) 08/06/2011 0327   HDL 43 10/10/2022 0941   HDL 42 01/08/2015 0925   HDL 25 (L) 08/06/2011 0327   CHOLHDL 3.0 10/10/2022 0941   VLDL 28 10/10/2022 0941   VLDL 75 (H) 08/06/2011 0327   LDLCALC 59 10/10/2022 0941   LDLCALC 76 01/08/2015 0925   LDLCALC 41 08/06/2011 0327    ECG personally reviewed by me today none today.   EKG 09/12/2021  normal sinus rhythm 80 bpm  Cardiac catheterization 10/14/2019 Patent saphenous vein graft to the ramus intermedius  but occlusion occlusion of the saphenous vein graft limb to the obtuse marginal.   Patent LIMA to distal LAD Patent RIMA to PDA Totally occluded distal left main Totally occluded ostial circumflex Totally occluded ostial LAD Normal LV end-diastolic pressure.   RECOMMENDATIONS:   Toprol -XL 25 mg/day After 1 week if angina is still significant, add Imdur  30 mg/day. Further titrate anti-ischemic therapy until relief of symptoms. No interventional targets.    Diagnostic Dominance: Right    Intervention   Cardiac Catheterization: 10/10/2022    Ost LM to Dist LM lesion is 50% stenosed.   Mid LAD lesion is 100% stenosed.   Ost LAD to Mid LAD lesion is 99% stenosed.   Ost Cx to Prox Cx lesion is 99% stenosed.   Origin lesion  is 30% stenosed.   Origin to Insertion lesion between Ramus and 3rd Mrg  is 100% stenosed.   Origin to Insertion lesion is 100% stenosed.   3rd Mrg lesion is 100% stenosed.   LIMA and is small.   SVG and is normal in caliber.   SVG.   LV end diastolic pressure is normal.   Severe 3 vessel obstructive CAD Patent LIMA to the LAD Patent RIMA to the PDA Patent SVG to the ramus intermedius Normal LVEDP   Plan: no change compared to prior cardiac cath in July 2023. Continue medical management.  Cardiac PET/CT 02/11/24    LV perfusion is abnormal. Defect 1: There is a medium defect with moderate to severe reduction in uptake present in the apical to mid anterior and anterolateral location(s) that is partially reversible. There is abnormal wall motion in the defect area. Consistent with infarction and peri-infarct ischemia.   Rest left ventricular function is normal. Rest EF: 67%. Stress left ventricular function is normal. Stress EF: 68%. End diastolic cavity size is normal.   Myocardial blood flow reserve is not reported in this patient due to technical or patient-specific concerns that affect accuracy.   Coronary calcium  assessment not performed due to  prior revascularization.   Findings are consistent with infarction with peri-infarct ischemia. The study is intermediate risk.   Electronically signed by: Lonni Hanson, MD.  Assessment & Plan   1.  Coronary artery disease-he continues with no chest pain today.  He denies further episodes of exertional chest discomfort.  Continue aspirin , Imdur , lisinopril , metoprolol , omega-3 fatty acids, rosuvastatin , sublingual nitroglycerin  as needed Heart healthy low-sodium diet-reviewed Continue to slowly increase physical activity.  Essential hypertension-BP today 112***/64.  Was noted to be hypotensive while in the hospital.  He required a short hold of his antihypertensive medications. Continue lisinopril , Imdur , metoprolol  Continue to maintain blood pressure log  Hyperlipidemia-LDL 59 on 10/10/22.   Continue Lovaza , Crestor , omega-3 fatty acids Heart healthy low-sodium high-fiber diet Follow any PCP   Esophageal stricture-admitted to the hospital for urgent EGD 05/19/2024.***. Following with GI   Disposition: Follow-up with Dr. Mona or me in 6 months.   Billy Cisneros. Omaira Mellen NP-C    05/18/2024, 7:41 AM [facility] [address] Office [phone] Fax [phone]    I spent 14 *** minutes examining this patient, reviewing medications, and using patient centered shared decision making involving their cardiac care.  I spent 20 minutes reviewing  past medical history,  medications, and prior cardiac tests. "

## 2024-05-19 ENCOUNTER — Encounter
Admission: EM | Disposition: A | Discharge status: Home / Self Care | Payer: Self-pay | Source: Home / Self Care | Attending: Emergency Medicine

## 2024-05-19 ENCOUNTER — Encounter: Payer: Self-pay | Admitting: Emergency Medicine

## 2024-05-19 ENCOUNTER — Other Ambulatory Visit: Payer: Self-pay

## 2024-05-19 ENCOUNTER — Ambulatory Visit
Admission: EM | Admit: 2024-05-19 | Discharge: 2024-05-19 | Disposition: A | Discharge status: Home / Self Care | Attending: Emergency Medicine | Admitting: Emergency Medicine

## 2024-05-19 ENCOUNTER — Ambulatory Visit: Admitting: Certified Registered"

## 2024-05-19 DIAGNOSIS — I129 Hypertensive chronic kidney disease with stage 1 through stage 4 chronic kidney disease, or unspecified chronic kidney disease: Secondary | ICD-10-CM | POA: Diagnosis not present

## 2024-05-19 DIAGNOSIS — Z951 Presence of aortocoronary bypass graft: Secondary | ICD-10-CM | POA: Insufficient documentation

## 2024-05-19 DIAGNOSIS — W44F3XA Food entering into or through a natural orifice, initial encounter: Secondary | ICD-10-CM | POA: Diagnosis not present

## 2024-05-19 DIAGNOSIS — F419 Anxiety disorder, unspecified: Secondary | ICD-10-CM | POA: Insufficient documentation

## 2024-05-19 DIAGNOSIS — F32A Depression, unspecified: Secondary | ICD-10-CM | POA: Diagnosis not present

## 2024-05-19 DIAGNOSIS — E039 Hypothyroidism, unspecified: Secondary | ICD-10-CM | POA: Diagnosis not present

## 2024-05-19 DIAGNOSIS — I252 Old myocardial infarction: Secondary | ICD-10-CM | POA: Insufficient documentation

## 2024-05-19 DIAGNOSIS — K222 Esophageal obstruction: Secondary | ICD-10-CM | POA: Diagnosis not present

## 2024-05-19 DIAGNOSIS — T18128A Food in esophagus causing other injury, initial encounter: Secondary | ICD-10-CM | POA: Diagnosis present

## 2024-05-19 DIAGNOSIS — I25119 Atherosclerotic heart disease of native coronary artery with unspecified angina pectoris: Secondary | ICD-10-CM | POA: Insufficient documentation

## 2024-05-19 DIAGNOSIS — M109 Gout, unspecified: Secondary | ICD-10-CM | POA: Diagnosis not present

## 2024-05-19 DIAGNOSIS — I10 Essential (primary) hypertension: Secondary | ICD-10-CM | POA: Insufficient documentation

## 2024-05-19 DIAGNOSIS — N183 Chronic kidney disease, stage 3 unspecified: Secondary | ICD-10-CM | POA: Diagnosis not present

## 2024-05-19 DIAGNOSIS — K219 Gastro-esophageal reflux disease without esophagitis: Secondary | ICD-10-CM | POA: Insufficient documentation

## 2024-05-19 DIAGNOSIS — Z79899 Other long term (current) drug therapy: Secondary | ICD-10-CM | POA: Insufficient documentation

## 2024-05-19 DIAGNOSIS — K449 Diaphragmatic hernia without obstruction or gangrene: Secondary | ICD-10-CM | POA: Insufficient documentation

## 2024-05-19 HISTORY — PX: FOREIGN BODY REMOVAL ESOPHAGEAL: SHX5322

## 2024-05-19 HISTORY — PX: ESOPHAGOGASTRODUODENOSCOPY: SHX5428

## 2024-05-19 MED ORDER — EPHEDRINE SULFATE-NACL 50-0.9 MG/10ML-% IV SOSY
PREFILLED_SYRINGE | INTRAVENOUS | Status: DC | PRN
  Administered 2024-05-19: 10 mg via INTRAVENOUS

## 2024-05-19 MED ORDER — GLUCAGON HCL RDNA (DIAGNOSTIC) 1 MG IJ SOLR
1.0000 mg | Freq: Once | INTRAMUSCULAR | Status: AC
  Administered 2024-05-19: 1 mg via INTRAVENOUS
  Filled 2024-05-19: qty 1

## 2024-05-19 MED ORDER — PROPOFOL 10 MG/ML IV BOLUS
INTRAVENOUS | Status: AC
  Filled 2024-05-19: qty 20

## 2024-05-19 MED ORDER — SODIUM CHLORIDE 0.9 % IV SOLN: INTRAVENOUS | Status: DC

## 2024-05-19 MED ORDER — LIDOCAINE HCL (CARDIAC) PF 100 MG/5ML IV SOSY
PREFILLED_SYRINGE | INTRAVENOUS | Status: DC | PRN
  Administered 2024-05-19: 60 mg via INTRAVENOUS

## 2024-05-19 MED ORDER — SUCCINYLCHOLINE CHLORIDE 200 MG/10ML IV SOSY
PREFILLED_SYRINGE | INTRAVENOUS | Status: DC | PRN
  Administered 2024-05-19: 120 mg via INTRAVENOUS

## 2024-05-19 MED ORDER — ONDANSETRON HCL 4 MG/2ML IJ SOLN
INTRAMUSCULAR | Status: DC | PRN
  Administered 2024-05-19: 4 mg via INTRAVENOUS

## 2024-05-19 MED ORDER — FENTANYL CITRATE (PF) 100 MCG/2ML IJ SOLN
INTRAMUSCULAR | Status: DC | PRN
  Administered 2024-05-19: 25 ug via INTRAVENOUS

## 2024-05-19 MED ORDER — PROPOFOL 10 MG/ML IV BOLUS
INTRAVENOUS | Status: DC | PRN
  Administered 2024-05-19: 120 mg via INTRAVENOUS
  Administered 2024-05-19: 80 mg via INTRAVENOUS
  Administered 2024-05-19: 50 mg via INTRAVENOUS

## 2024-05-19 MED ORDER — DEXAMETHASONE SOD PHOSPHATE PF 10 MG/ML IJ SOLN
INTRAMUSCULAR | Status: DC | PRN
  Administered 2024-05-19: 10 mg via INTRAVENOUS

## 2024-05-19 MED ORDER — FENTANYL CITRATE (PF) 100 MCG/2ML IJ SOLN
INTRAMUSCULAR | Status: AC
  Filled 2024-05-19: qty 2

## 2024-05-19 NOTE — ED Provider Notes (Signed)
 "  [facility] Provider Note    Event Date/Time   First MD Initiated Contact with Patient 05/19/24 0147     (approximate)   History   Chief Complaint Foreign Body   HPI  Billy Douthat. is a 74 y.o. male with past medical history of hypertension, CAD, CKD, and esophageal obstruction due to food bolus who presents to the ED complaining of foreign body.  Patient reports that he had primary for dinner around 7 PM last night, since then has felt like a piece of steak is stuck in his throat.  He feels like something is stuck around the middle of his sternum and whenever he goes to drink liquids, they come right back up.  He has had this happen before and has had tempted to drink soda a couple of times to try to clear the blockage, but it comes back up each time.  He denies any difficulty breathing.     Physical Exam   Triage Vital Signs: ED Triage Vitals  Encounter Vitals Group     BP 05/19/24 0145 135/72     Girls Systolic BP Percentile --      Girls Diastolic BP Percentile --      Boys Systolic BP Percentile --      Boys Diastolic BP Percentile --      Pulse Rate 05/19/24 0145 83     Resp 05/19/24 0145 18     Temp 05/19/24 0145 97.9 F (36.6 C)     Temp Source 05/19/24 0145 Oral     SpO2 05/19/24 0145 93 %     Weight 05/19/24 0137 180 lb (81.6 kg)     Height 05/19/24 0137 5' 5 (1.651 m)     Head Circumference --      Peak Flow --      Pain Score 05/19/24 0137 5     Pain Loc --      Pain Education --      Exclude from Growth Chart --     Most recent vital signs: Vitals:   05/19/24 0145  BP: 135/72  Pulse: 83  Resp: 18  Temp: 97.9 F (36.6 C)  SpO2: 93%    Constitutional: Alert and oriented. Eyes: Conjunctivae are normal. Head: Atraumatic. Nose: No congestion/rhinnorhea. Mouth/Throat: Mucous membranes are moist.  Posterior oropharynx clear. Cardiovascular: Normal rate, regular rhythm. Grossly normal heart sounds.  2+ radial pulses  bilaterally. Respiratory: Normal respiratory effort.  No retractions. Lungs CTAB. Gastrointestinal: Soft and nontender. No distention. Musculoskeletal: No lower extremity tenderness nor edema.  Neurologic:  Normal speech and language. No gross focal neurologic deficits are appreciated.    ED Results / Procedures / Treatments   Labs (all labs ordered are listed, but only abnormal results are displayed) Labs Reviewed - No data to display   PROCEDURES:  Critical Care performed: No  Procedures   MEDICATIONS ORDERED IN ED: Medications  glucagon  (human recombinant) (GLUCAGEN ) injection 1 mg (1 mg Intravenous Given 05/19/24 0228)     IMPRESSION / MDM / ASSESSMENT AND PLAN / ED COURSE  I reviewed the triage vital signs and the nursing notes.                              74 y.o. male with past medical history of hypertension, CAD, and CKD who presents to the ED complaining of feeling like a piece of steak is stuck in the back  of his throat since 7 PM last night.  Patient's presentation is most consistent with acute presentation with potential threat to life or bodily function.  Differential diagnosis includes, but is not limited to, esophageal obstruction, globus sensation, airway obstruction.  Patient well-appearing and in no acute distress, vital signs are unremarkable.  Symptoms consistent with esophageal obstruction due to food bolus, patient has already tried drinking carbonated beverages without relief, will trial dose of IV glucagon .  No evidence of airway obstruction at this time.  Patient without relief following IV glucagon , continues to be unable to tolerate any liquids and feels like something is stuck.  Case discussed with Dr. Aundria of GI, who will arrange for endoscopy.      FINAL CLINICAL IMPRESSION(S) / ED DIAGNOSES   Final diagnoses:  Esophageal obstruction due to food impaction     Rx / DC Orders   ED Discharge Orders     None        Note:  This  document was prepared using Dragon voice recognition software and may include unintentional dictation errors.   Willo Dunnings, MD 05/19/24 [phone]  "

## 2024-05-19 NOTE — Op Note (Signed)
 [facility] Gastroenterology Patient Name: Billy Cisneros Procedure Date: 05/19/2024 4:51 AM MRN: [id number] Account #: [id number] Date of Birth: 1950/11/23 Admit Type: Outpatient Age: 74 Room: [facility] ENDO ROOM 4 Gender: Male Note Status: Finalized Instrument Name: Endoscope [id number] Procedure:             Upper GI endoscopy Indications:           Removal of foreign body in the esophagus, Dysphagia,                         Foreign body in the esophagus Providers:             Billy Chao K. Colter Magowan MD, MD Medicines:             Propofol  per Anesthesia Complications:         No immediate complications. Estimated blood loss:                         Minimal. Procedure:             Pre-Anesthesia Assessment:                        - The risks and benefits of the procedure and the                         sedation options and risks were discussed with the                         patient. All questions were answered and informed                         consent was obtained.                        - Patient identification and proposed procedure were                         verified prior to the procedure by the nurse. The                         procedure was verified in the procedure room.                        - ASA Grade Assessment: III - A patient with severe                         systemic disease.                        - After reviewing the risks and benefits, the patient                         was deemed in satisfactory condition to undergo the                         procedure.                        After obtaining informed consent, the endoscope was  passed under direct vision. Throughout the procedure,                         the patient's blood pressure, pulse, and oxygen                         saturations were monitored continuously. The Endoscope                         was introduced through the mouth, and advanced to the                          third part of duodenum. The upper GI endoscopy was                         technically difficult and complex due to presence of                         food. Successful completion of the procedure was aided                         by performing the maneuvers documented (below) in this                         report. The patient tolerated the procedure well. Findings:      Food was found in the lower third of the esophagus. Removal was       accomplished with a coin-grabber. Estimated blood loss was minimal.      One benign-appearing, intrinsic mild stenosis was found in the distal       esophagus. This stenosis measured 1.5 cm (inner diameter) x less than       one cm (in length). The stenosis was traversed. Estimated blood loss:       none.      The stomach was normal.      The examined duodenum was normal. Impression:            - Food in the lower third of the esophagus. Removal                         was successful.                        - Benign-appearing esophageal stenosis.                        - Normal stomach.                        - Normal examined duodenum. Recommendation:        - Patient has a contact number available for                         emergencies. The signs and symptoms of potential                         delayed complications were discussed with the patient.  Return to normal activities tomorrow. Written                         discharge instructions were provided to the patient.                        - Mechanical soft diet for 2 days.                        - Continue present medications.                        - Repeat upper endoscopy at appointment to be                         scheduled Esophageal dilation of stricture.                        - Return to my office in 1 month.                        - Telephone GI office to schedule appointment.                        - The findings and recommendations were discussed  with                         the patient and their spouse.                        - Discharge home from endoscopy/PACU Procedure Code(s):     --- Professional ---                        [phone], Esophagogastroduodenoscopy, flexible,                         transoral; with removal of foreign body(s) Diagnosis Code(s):     --- Professional ---                        R13.10, Dysphagia, unspecified                        T18.108A, Unspecified foreign body in esophagus                         causing other injury, initial encounter                        K22.2, Esophageal obstruction                        U81.871J, Food in esophagus causing other injury,                         initial encounter CPT copyright 2022 [employer]. All rights reserved. The codes documented in this report are preliminary and upon coder review may  be revised to meet current compliance requirements. Billy MARLA Boss MD, MD 05/19/2024 5:34:46 AM This report has been signed electronically. Number of Addenda: 0 Note Initiated On: 05/19/2024 4:51 AM Estimated Blood  Loss:  Estimated blood loss was minimal.      [facility]

## 2024-05-19 NOTE — Transfer of Care (Signed)
 Immediate Anesthesia Transfer of Care Note  Patient: Billy Cisneros.  Procedure(s) Performed: EGD (ESOPHAGOGASTRODUODENOSCOPY)  Patient Location: Endoscopy Unit  Anesthesia Type:General  Level of Consciousness: drowsy  Airway & Oxygen Therapy: Patient Spontanous Breathing and Patient connected to nasal cannula oxygen  Post-op Assessment: Report given to RN, Post -op Vital signs reviewed and stable, and Patient moving all extremities  Post vital signs: Reviewed and stable  Last Vitals:  Vitals Value Taken Time  BP 130/74 05/19/24 05:44  Temp 35.8 C 05/19/24 05:42  Pulse 86 05/19/24 05:45  Resp 14 05/19/24 05:45  SpO2 100 % 05/19/24 05:45  Vitals shown include unfiled device data.  Last Pain:  Vitals:   05/19/24 0542  TempSrc: Temporal  PainSc: Asleep         Complications: No notable events documented.

## 2024-05-19 NOTE — Anesthesia Preprocedure Evaluation (Signed)
 "                                  Anesthesia Evaluation  Patient identified by MRN, date of birth, ID band Patient awake    Reviewed: Allergy & Precautions, H&P , NPO status , Patient's Chart, lab work & pertinent test results, reviewed documented beta blocker date and time   Airway Mallampati: II  TM Distance: >3 FB Neck ROM: full    Dental  (+) Teeth Intact   Pulmonary neg pulmonary ROS   Pulmonary exam normal        Cardiovascular Exercise Tolerance: Poor hypertension, On Medications + angina with exertion + CAD and + Past MI  Normal cardiovascular exam Rhythm:regular Rate:Normal     Neuro/Psych   Anxiety Depression     Neuromuscular disease  negative psych ROS   GI/Hepatic Neg liver ROS, hiatal hernia,GERD  Medicated,,  Endo/Other  Hypothyroidism    Renal/GU Renal disease  negative genitourinary   Musculoskeletal   Abdominal   Peds  Hematology negative hematology ROS (+)   Anesthesia Other Findings Past Medical History: No date: Anxiety     Comment:  a.) on BZO (alprazolam ) PRN No date: Cervical myelopathy (HCC) No date: CKD (chronic kidney disease) stage 3, GFR 30-59 ml/min (HCC) 03/15/1999: Coronary artery disease     Comment:  a.) LHC 03/15/1999: multi-vessel CAD --> CVTS; b.) s/p               5v CABG 03/18/1999; c.) LHC 03/29/2003, 03/12/2006,               01/12/2007, 05/02/2010 - MV Dz with patent bypass grafts               x 5; d.) LHC 10/14/2019: MV Dz with CTO OM1; e.) LHC               11/11/2021: MV Dz with CTO SVG-OM1 and SVG-D1; f.) NSTEMI              10/09/2022 --> LHC 10/10/2022: MV Dz with CTO OM1 and SVG              D1 - med mgmt No date: DDD (degenerative disc disease), thoracolumbar No date: Depression No date: Diverticulosis No date: Duodenal diverticulum No date: GERD (gastroesophageal reflux disease) No date: Gout No date: Hepatic steatosis No date: Hiatal hernia No date: History of cocaine abuse (HCC)      Comment:  last used in 2015 per pt (as of 02-15-24) No date: Hyperlipidemia No date: Hypertension No date: Hypothyroidism No date: Insomnia     Comment:  a.) on hypnotic (zolpidem ) PRN No date: Long-term use of aspirin  therapy 02/24/2024: Nausea and vomiting 10/09/2022: NSTEMI (non-ST elevated myocardial infarction) (HCC) 03/18/1999: S/P CABG x 5     Comment:  a.) LIMA-LAD, RIMA-RCA, SVG-OM1, SVG-D1, SVG-RI No date: Spinal stenosis of cervical region with radiculopathy No date: Umbilical hernia 11/11/2021: Unstable angina (HCC) Past Surgical History: 02/22/2024: CERVICAL LAMINOPLASTY; N/A     Comment:  Procedure: CERVICAL LAMINOPLASTY;  Surgeon: Clois Fret, MD;  Location: [facility] ORS;  Service: Neurosurgery;              Laterality: N/A;  C3-7 LAMINOPLASTY 05/25/2017: COLONOSCOPY WITH PROPOFOL ; N/A     Comment:  Procedure: COLONOSCOPY WITH PROPOFOL ;  Surgeon: Therisa,  Ruel, MD;  Location: [facility] ENDOSCOPY;  Service:               Gastroenterology;  Laterality: N/A; 03/18/1999: CORONARY ARTERY BYPASS GRAFT; N/A     Comment:  Procedure: CORONARY ARTERY BYPASS GRAFT; Location: [facility] 10/27/2017: ESOPHAGOGASTRODUODENOSCOPY; N/A     Comment:  Procedure: ESOPHAGOGASTRODUODENOSCOPY (EGD);  Surgeon:               Unk Corinn Skiff, MD;  Location: [facility]COPY;                Service: Gastroenterology;  Laterality: N/A; 11/19/2017: ESOPHAGOGASTRODUODENOSCOPY (EGD) WITH PROPOFOL ; N/A     Comment:  Procedure: ESOPHAGOGASTRODUODENOSCOPY (EGD) WITH               PROPOFOL ;  Surgeon: Unk Corinn Skiff, MD;  Location:               [facility] ENDOSCOPY;  Service: Gastroenterology;  Laterality:               N/A; 03/15/1999: LEFT HEART CATH AND CORONARY ANGIOGRAPHY; Left     Comment:  Procedure: LEFT HEART CATH AND CORONARY ANGIOGRAPHY;               Location: [facility] 10/14/2019: LEFT HEART CATH AND CORS/GRAFTS ANGIOGRAPHY; N/A     Comment:   Procedure: LEFT HEART CATH AND CORS/GRAFTS ANGIOGRAPHY;               Surgeon: Claudene Victory ORN, MD;  Location: [facility]               LAB;  Service: Cardiovascular;  Laterality: N/A; 11/11/2021: LEFT HEART CATH AND CORS/GRAFTS ANGIOGRAPHY; N/A     Comment:  Procedure: LEFT HEART CATH AND CORS/GRAFTS ANGIOGRAPHY;               Surgeon: Mady Bruckner, MD;  Location: [facility] CV               LAB;  Service: Cardiovascular;  Laterality: N/A; 10/10/2022: LEFT HEART CATH AND CORS/GRAFTS ANGIOGRAPHY; N/A     Comment:  Procedure: LEFT HEART CATH AND CORS/GRAFTS ANGIOGRAPHY;               Surgeon: Jordan, Peter M, MD;  Location: [facility]               LAB;  Service: Cardiovascular;  Laterality: N/A; 03/29/2003: LEFT HEART CATH AND CORS/GRAFTS ANGIOGRAPHY; Left     Comment:  Procedure: LEFT HEART CATH AND CORS/GRAFTS ANGIOGRAPHY;               Location: [facility]; Surgeon: Vinie Jude, MD 03/12/2006: LEFT HEART CATH AND CORS/GRAFTS ANGIOGRAPHY; Left     Comment:  Procedure: LEFT HEART CATH AND CORS/GRAFTS ANGIOGRAPHY;               Location: [facility]; Surgeon: Wolm Rhyme, MD 01/12/2007: LEFT HEART CATH AND CORS/GRAFTS ANGIOGRAPHY; Left     Comment:  Procedure: LEFT HEART CATH AND CORS/GRAFTS ANGIOGRAPHY;               Location: [facility]; Surgeon: Wolm Rhyme, MD 05/02/2010: LEFT HEART CATH AND CORS/GRAFTS ANGIOGRAPHY; Left     Comment:  Procedure: LEFT HEART CATH AND CORS/GRAFTS ANGIOGRAPHY;               Location: [facility]; Surgeon: Marsa Dooms, MD BMI    Body Mass Index: 29.95 kg/m  Reproductive/Obstetrics negative OB ROS                              Anesthesia Physical Anesthesia Plan  ASA: 3  Anesthesia Plan: General ETT   Post-op Pain Management:    Induction:   PONV Risk Score and Plan:   Airway Management Planned:   Additional Equipment:   Intra-op Plan:   Post-operative Plan:   Informed Consent: I have reviewed the patients  History and Physical, chart, labs and discussed the procedure including the risks, benefits and alternatives for the proposed anesthesia with the patient or authorized representative who has indicated his/her understanding and acceptance.     Dental Advisory Given  Plan Discussed with: CRNA  Anesthesia Plan Comments:         Anesthesia Quick Evaluation  "

## 2024-05-19 NOTE — Anesthesia Procedure Notes (Signed)
 Procedure Name: Intubation Date/Time: 05/19/2024 5:10 AM  Performed by: Landy Francena BIRCH, CRNAPre-anesthesia Checklist: Patient identified, Emergency Drugs available, Suction available and Patient being monitored Patient Re-evaluated:Patient Re-evaluated prior to induction Oxygen Delivery Method: Circle system utilized Preoxygenation: Pre-oxygenation with 100% oxygen Induction Type: IV induction, Rapid sequence and Cricoid Pressure applied Laryngoscope Size: McGrath and 3 Grade View: Grade I Tube type: Oral Tube size: 7.5 mm Number of attempts: 1 Airway Equipment and Method: Stylet, Oral airway and Bite block Placement Confirmation: ETT inserted through vocal cords under direct vision, positive ETCO2 and breath sounds checked- equal and bilateral Secured at: 22 cm Tube secured with: Tape Dental Injury: Teeth and Oropharynx as per pre-operative assessment

## 2024-05-19 NOTE — Discharge Instructions (Signed)
Soft diet for 2 days.

## 2024-05-19 NOTE — ED Triage Notes (Signed)
 Patient ambulatory to triage with steady gait, without difficulty or distress noted; pt reports at 7pm at steak and cont to feel it stuck in his esophagus; unable to swallow any liquids or secretions; attempted cola without relief; st hx of same

## 2024-05-19 NOTE — Consult Note (Signed)
 "   [facility] GI Emergency Consult Note   Ladell Francis Boss, M.D.  Reason for Consult: Food impaction in the esophagus with resultant dysphagia.   Attending Requesting Consult: Carlin Palin, M.D.  History of Present Illness: Billy Cisneros. is a 74 y.o. male presenting with persistent dysphagia after eating steak last evening.  Patient is unable to swallow liquids currently and feels pressure in the sternum without frank chest pain.  No hematemesis noted.  The patient has had at least 2 previous episodes of presumed food impaction and has undergone 2 previous endoscopies in 2019 by Dr. Unk.  1 particular upper endoscopy revealed an intrinsic stricture at the distal esophagus that required esophageal dilatation.  That was performed after removal of the food bolus from the distal esophagus. Patient was seen by Dr. Palin in the emergency room at which time the emergency room physician gave him glucagon  and moderate amount of observation prior to calling for gastroenterology consultation.  Patient still unable to handle oral secretions or swallow liquids.  Past Medical History:  Past Medical History:  Diagnosis Date   Anxiety    a.) on BZO (alprazolam ) PRN   Cervical myelopathy (HCC)    CKD (chronic kidney disease) stage 3, GFR 30-59 ml/min (HCC)    Coronary artery disease 03/15/1999   a.) LHC 03/15/1999: multi-vessel CAD --> CVTS; b.) s/p 5v CABG 03/18/1999; c.) LHC 03/29/2003, 03/12/2006, 01/12/2007, 05/02/2010 - MV Dz with patent bypass grafts x 5; d.) LHC 10/14/2019: MV Dz with CTO OM1; e.) LHC 11/11/2021: MV Dz with CTO SVG-OM1 and SVG-D1; f.) NSTEMI 10/09/2022 --> LHC 10/10/2022: MV Dz with CTO OM1 and SVG D1 - med mgmt   DDD (degenerative disc disease), thoracolumbar    Depression    Diverticulosis    Duodenal diverticulum    GERD (gastroesophageal reflux disease)    Gout    Hepatic steatosis    Hiatal hernia    History of cocaine abuse (HCC)    last used in  2015 per pt (as of 02-15-24)   Hyperlipidemia    Hypertension    Hypothyroidism    Insomnia    a.) on hypnotic (zolpidem ) PRN   Long-term use of aspirin  therapy    Nausea and vomiting 02/24/2024   NSTEMI (non-ST elevated myocardial infarction) (HCC) 10/09/2022   S/P CABG x 5 03/18/1999   a.) LIMA-LAD, RIMA-RCA, SVG-OM1, SVG-D1, SVG-RI   Spinal stenosis of cervical region with radiculopathy    Umbilical hernia    Unstable angina (HCC) 11/11/2021    Problem List: Patient Active Problem List   Diagnosis Date Noted   Demand ischemia (HCC) 02/27/2024   Hematemesis without nausea 02/25/2024   Coronary artery disease of native artery of native heart with stable angina pectoris 02/25/2024   Elevated troponin 02/25/2024   Acute metabolic encephalopathy 02/24/2024   Acute respiratory failure with hypoxia (HCC) 02/24/2024   Nausea and vomiting 02/24/2024   Coffee ground emesis 02/24/2024   Cervical myelopathy (HCC) 02/22/2024   Cervical spinal stenosis 02/22/2024   NSTEMI (non-ST elevated myocardial infarction) (HCC) 10/09/2022   Unstable angina (HCC) 11/11/2021   Esophageal dysphagia    Esophageal obstruction due to food impaction    CKD (chronic kidney disease), stage III 09/05/2017   Acute gout 02/19/2016   GERD (gastroesophageal reflux disease) 01/21/2016   Pain and swelling of right wrist 08/13/2015   Productive cough 08/13/2015   Chronic right shoulder pain 06/12/2015   Cannot sleep 06/12/2015   Hyperlipidemia LDL goal <  70 03/12/2015   Cervical radiculopathy 01/08/2015   Annual physical exam 01/01/2015   Neck pain on left side 01/01/2015   CAD with hx of CABG 12/11/2014   Essential hypertension 12/04/2014   Anxiety 12/04/2014   Chest pain 12/04/2014   Major depression 11/21/2011    Past Surgical History: Past Surgical History:  Procedure Laterality Date   CERVICAL LAMINOPLASTY N/A 02/22/2024   Procedure: CERVICAL LAMINOPLASTY;  Surgeon: Clois Fret, MD;   Location: [facility] ORS;  Service: Neurosurgery;  Laterality: N/A;  C3-7 LAMINOPLASTY   COLONOSCOPY WITH PROPOFOL  N/A 05/25/2017   Procedure: COLONOSCOPY WITH PROPOFOL ;  Surgeon: Therisa Bi, MD;  [facility] END[facility]COPY;  Service: Gastroenterology;  Laterality: N/A;   CORONARY ARTERY BYPASS GRAFT N/A 03/18/1999   Procedure: CORONARY ARTERY BYPASS GRAFT; Location: [place]   ESOPHAGOGASTRODUODENOSCOPY N/A 10/27/2017   Procedure: ESOPHAGOGASTRODUODENOSCOPY (EGD);  Surgeon: Unk Corinn Skiff, MD;  Location: [facility] ENDOSCOPY;  Service: Gastroenterology;  Laterality: N/A;   ESOPHAGOGASTRODUODENOSCOPY (EGD) WITH PROPOFOL  N/A 11/19/2017   Procedure: ESOPHAGOGASTRODUODENOSCOPY (EGD) WITH PROPOFOL ;  Surgeon: Unk Corinn Skiff, MD;  Location: [facility] ENDOSCOPY;  Service: Gastroenterology;  Laterality: N/A;   LEFT HEART CATH AND CORONARY ANGIOGRAPHY Left 03/15/1999   Procedure: LEFT HEART CATH AND CORONARY ANGIOGRAPHY; Location: [facility]   LEFT HEART CATH AND CORS/GRAFTS ANGIOGRAPHY N/A 10/14/2019   Procedure: LEFT HEART CATH AND CORS/GRAFTS ANGIOGRAPHY;  Surgeon: Claudene Victory ORN, MD;  Location: [facility] INVASIVE CV LAB;  Service: Cardiovascular;  Laterality: N/A;   LEFT HEART CATH AND CORS/GRAFTS ANGIOGRAPHY N/A 11/11/2021   Procedure: LEFT HEART CATH AND CORS/GRAFTS ANGIOGRAPHY;  Surgeon: Mady Bruckner, MD;  Location: [facility] INVASIVE CV LAB;  Service: Cardiovascular;  Laterality: N/A;   LEFT HEART CATH AND CORS/GRAFTS ANGIOGRAPHY N/A 10/10/2022   Procedure: LEFT HEART CATH AND CORS/GRAFTS ANGIOGRAPHY;  Surgeon: Jordan, Peter M, MD;  Location: [facility] INVASIVE CV LAB;  Service: Cardiovascular;  Laterality: N/A;   LEFT HEART CATH AND CORS/GRAFTS ANGIOGRAPHY Left 03/29/2003   Procedure: LEFT HEART CATH AND CORS/GRAFTS ANGIOGRAPHY; Location: [facility]; Surgeon: Vinie Jude, MD   LEFT HEART CATH AND CORS/GRAFTS ANGIOGRAPHY Left 03/12/2006   Procedure: LEFT HEART CATH AND CORS/GRAFTS ANGIOGRAPHY; Location: [facility]; Surgeon: Wolm Rhyme, MD   LEFT HEART CATH AND CORS/GRAFTS ANGIOGRAPHY Left 01/12/2007   Procedure: LEFT HEART CATH AND CORS/GRAFTS ANGIOGRAPHY; Location: [facility]; Surgeon: Wolm Rhyme, MD   LEFT HEART CATH AND CORS/GRAFTS ANGIOGRAPHY Left 05/02/2010   Procedure: LEFT HEART CATH AND CORS/GRAFTS ANGIOGRAPHY; Location: [facility]; Surgeon: Marsa Dooms, MD    Allergies: Allergies[1]  Home Medications: Medications Prior to Admission  Medication Sig Dispense Refill Last Dose/Taking   allopurinol  (ZYLOPRIM ) 100 MG tablet Take 100 mg by mouth daily.      ALPRAZolam  (XANAX ) 0.5 MG tablet TAKE 1 TABLET BY MOUTH 3 TIMES A DAY AS NEEDED FOR ANXIETY. 90 tablet 2    ARIPiprazole  (ABILIFY ) 2 MG tablet Take 2 mg by mouth at bedtime.      ascorbic acid  (VITAMIN C ) 500 MG tablet Take 1 tablet (500 mg total) by mouth daily. 30 tablet 0    aspirin  EC 81 MG tablet Take 1 tablet (81 mg total) by mouth daily. For heart attack prevention.      buPROPion  (WELLBUTRIN  XL) 300 MG 24 hr tablet Take 1 tablet (300 mg total) by mouth daily. 90 tablet 0    colchicine  0.6 MG tablet Take 0.6 mg by mouth daily as needed (gout).      iron  polysaccharides (NIFEREX) 150 MG capsule Take 1 capsule (  150 mg total) by mouth daily. 30 capsule 3    isosorbide  mononitrate (IMDUR ) 60 MG 24 hr tablet Take 1 tablet (60 mg total) by mouth daily. 90 tablet 1    levothyroxine  (SYNTHROID ) 25 MCG tablet Take 25 mcg by mouth daily before breakfast.      lisinopril  (ZESTRIL ) 5 MG tablet Take 1 tablet (5 mg total) by mouth daily. 90 tablet 1    methocarbamol  (ROBAXIN ) 500 MG tablet Take 1 tablet (500 mg total) by mouth every 8 (eight) hours as needed for muscle spasms. 60 tablet 0    metoprolol  succinate (TOPROL  XL) 25 MG 24 hr tablet Take 1 tablet (25 mg total) by mouth daily. 90 tablet 3    Multiple Vitamins-Minerals (MULTIVITAMIN WITH MINERALS) tablet Take 1 tablet by mouth at bedtime.      nitroGLYCERIN  (NITROSTAT ) 0.4 MG SL tablet Place 1 tablet (0.4  mg total) under the tongue every 5 (five) minutes as needed for chest pain. 25 tablet 5    omega-3 acid ethyl esters (LOVAZA ) 1 g capsule TAKE ONE CAPSULE BY MOUTH TWICE A DAY 90 capsule 1    pantoprazole  (PROTONIX ) 40 MG tablet Take 1 tablet (40 mg total) by mouth daily. 30 tablet 3    rosuvastatin  (CRESTOR ) 20 MG tablet Take 1 tablet (20 mg total) by mouth at bedtime. 90 tablet 3    valACYclovir  (VALTREX ) 500 MG tablet Take 500 mg by mouth 2 (two) times daily as needed (outbreak).       zolpidem  (AMBIEN ) 5 MG tablet Take 5 mg by mouth at bedtime as needed.      Home medication reconciliation was completed with the patient.   Scheduled Inpatient Medications:    Continuous Inpatient Infusions:    sodium chloride       PRN Inpatient Medications:    Family History: family history includes Asthma in his daughter; Cancer in his maternal grandmother; Diabetes in his brother and sister; Heart attack in his mother and paternal grandfather; Hyperlipidemia in his mother; Hypertension in his mother; Lung cancer in his father; Stroke in his maternal grandfather.   GI Family History: Negative  Social History:   reports that he has never smoked. He has never used smokeless tobacco. He reports current alcohol  use. He reports that he does not currently use drugs after having used the following drugs: Cocaine. Frequency: 2.00 times per week. The patient denies ETOH, tobacco, or drug use.    Review of Systems: Review of Systems - Negative except history of present illness  Physical Examination: BP 135/72 (BP Location: Right Arm)   Pulse 83   Temp 97.9 F (36.6 C) (Oral)   Resp 18   Ht 5' 5 (1.651 m)   Wt 81.6 kg   SpO2 93%   BMI 29.95 kg/m  Physical Exam Constitutional:      Appearance: He is obese. He is not ill-appearing or diaphoretic.  HENT:     Head: Atraumatic.     Nose: Nose normal.     Mouth/Throat:     Mouth: Mucous membranes are dry.  Eyes:     General: No scleral  icterus.    Extraocular Movements: Extraocular movements intact.     Conjunctiva/sclera: Conjunctivae normal.     Pupils: Pupils are equal, round, and reactive to light.  Cardiovascular:     Rate and Rhythm: Normal rate.     Pulses: Normal pulses.     Heart sounds: No murmur heard.    No gallop.  Pulmonary:  Breath sounds: Normal breath sounds.  Musculoskeletal:        General: Normal range of motion.     Cervical back: Normal range of motion and neck supple.  Skin:    General: Skin is warm.  Neurological:     General: No focal deficit present.     Mental Status: He is alert.  Psychiatric:        Mood and Affect: Mood normal.        Thought Content: Thought content normal.        Judgment: Judgment normal.     Data: Lab Results  Component Value Date   WBC 5.8 03/21/2024   HGB 11.6 (L) 03/21/2024   HCT 36.5 (L) 03/21/2024   MCV 95 03/21/2024   PLT 272 03/21/2024   No results for input(s): HGB in the last 168 hours. Lab Results  Component Value Date   NA 143 03/21/2024   K 4.6 03/21/2024   CL 105 03/21/2024   CO2 21 03/21/2024   BUN 15 03/21/2024   CREATININE 1.53 (H) 03/21/2024   Lab Results  Component Value Date   ALT 26 02/24/2024   AST 34 02/24/2024   ALKPHOS 38 02/24/2024   BILITOT 0.7 02/24/2024   No results for input(s): APTT, INR, PTT in the last 168 hours.    Latest Ref Rng & Units 03/21/2024   10:49 AM 02/27/2024   12:24 PM 02/27/2024    6:09 AM  CBC  WBC 3.4 - 10.8 x10E3/uL 5.8   11.1   Hemoglobin 13.0 - 17.7 g/dL 88.3  89.9  8.7   Hematocrit 37.5 - 51.0 % 36.5   25.8   Platelets 150 - 450 x10E3/uL 272   231     STUDIES: No results found. @IMAGES @  Assessment: Active Problems:   * No active hospital problems. *  1.  Dysphagia secondary to acute esophageal food impaction-patient has history of recurrent dysphagia related to presumable GERD and esophageal stricture. 2.  CAD status post CABG in 2011. 3.  History of GERD. 4  gout. 5.  Status post cervical laminectomy  Recommendations:  1.  Adequate IVs access. 2.  NPO. 3.  Urgent EGD.The patient understands the nature of the planned procedure, indications, risks, alternatives and potential complications including but not limited to bleeding, infection, perforation, damage to internal organs and possible oversedation/side effects from anesthesia. The patient agrees and gives consent to proceed.  Please refer to procedure notes for findings, recommendations and patient disposition/instructions.   Thank you for the consult. Please call with questions or concerns.  Aundria Ladell Eck MD [facility] [address] [place], [place] [address] [phone]  05/19/2024 4:47 AM         [1] No Known Allergies  "

## 2024-05-24 ENCOUNTER — Encounter: Payer: Self-pay | Admitting: General Practice

## 2024-05-24 ENCOUNTER — Ambulatory Visit: Attending: General Practice | Admitting: General Practice

## 2024-05-24 VITALS — BP 130/84 | HR 84 | Ht 60.0 in | Wt 181.8 lb

## 2024-05-24 DIAGNOSIS — I2510 Atherosclerotic heart disease of native coronary artery without angina pectoris: Secondary | ICD-10-CM | POA: Diagnosis not present

## 2024-05-24 DIAGNOSIS — I10 Essential (primary) hypertension: Secondary | ICD-10-CM | POA: Diagnosis not present

## 2024-05-24 DIAGNOSIS — E785 Hyperlipidemia, unspecified: Secondary | ICD-10-CM

## 2024-05-24 DIAGNOSIS — K222 Esophageal obstruction: Secondary | ICD-10-CM

## 2024-05-24 MED ORDER — OMEGA-3-ACID ETHYL ESTERS 1 G PO CAPS: 1.0000 | ORAL_CAPSULE | Freq: Two times a day (BID) | ORAL | 3 refills | Status: AC

## 2024-05-24 NOTE — Patient Instructions (Signed)
 Medication Instructions:  NO CHANGES *If you need a refill on your cardiac medications before your next appointment, please call your pharmacy*  Lab Work: NO LABS If you have labs (blood work) drawn today and your tests are completely normal, you will receive your results only by: MyChart Message (if you have MyChart) OR A paper copy in the mail If you have any lab test that is abnormal or we need to change your treatment, we will call you to review the results.  Testing/Procedures: NO TESTING  Follow-Up: At [facility], you and your health needs are our priority.  As part of our continuing mission to provide you with exceptional heart care, our providers are all part of one team.  This team includes your primary Cardiologist (physician) and Advanced Practice Providers or APPs (Physician Assistants and Nurse Practitioners) who all work together to provide you with the care you need, when you need it.  Your next appointment:   6 month(s)  Provider:   Vinie JAYSON Maxcy, MD or Josefa Beauvais, NP   Other Instructions

## 2024-05-31 NOTE — Anesthesia Postprocedure Evaluation (Signed)
"   Anesthesia Post Note  Patient: Billy Cisneros.  Procedure(s) Performed: EGD (ESOPHAGOGASTRODUODENOSCOPY) REMOVAL, FOREIGN BODY, ESOPHAGUS  Patient location during evaluation: PACU Anesthesia Type: General Level of consciousness: awake and alert Pain management: pain level controlled Vital Signs Assessment: post-procedure vital signs reviewed and stable Respiratory status: spontaneous breathing, nonlabored ventilation, respiratory function stable and patient connected to nasal cannula oxygen Cardiovascular status: blood pressure returned to baseline and stable Postop Assessment: no apparent nausea or vomiting Anesthetic complications: no   No notable events documented.   Last Vitals:  Vitals:   05/19/24 0602 05/19/24 0612  BP: 120/83 128/80  Pulse: 88 92  Resp: 17   Temp:    SpO2: 99% 94%    Last Pain:  Vitals:   05/19/24 0602  TempSrc:   PainSc: 0-No pain                 Lynwood KANDICE Clause      "

## 2024-08-25 ENCOUNTER — Ambulatory Visit: Admitting: Orthopedic Surgery
